# Patient Record
Sex: Male | Born: 1965 | Race: White | Hispanic: No | Marital: Married | Smoking: Current every day smoker
Health system: Southern US, Community
[De-identification: ages and names within clinical notes are randomized; demographics above are authoritative.]

## PROBLEM LIST (undated history)

## (undated) DIAGNOSIS — F319 Bipolar disorder, unspecified: Secondary | ICD-10-CM

## (undated) DIAGNOSIS — F329 Major depressive disorder, single episode, unspecified: Secondary | ICD-10-CM

## (undated) DIAGNOSIS — M1990 Unspecified osteoarthritis, unspecified site: Secondary | ICD-10-CM

## (undated) DIAGNOSIS — E039 Hypothyroidism, unspecified: Secondary | ICD-10-CM

## (undated) DIAGNOSIS — D649 Anemia, unspecified: Secondary | ICD-10-CM

## (undated) DIAGNOSIS — I714 Abdominal aortic aneurysm, without rupture, unspecified: Secondary | ICD-10-CM

## (undated) DIAGNOSIS — K5792 Diverticulitis of intestine, part unspecified, without perforation or abscess without bleeding: Secondary | ICD-10-CM

## (undated) DIAGNOSIS — R0902 Hypoxemia: Secondary | ICD-10-CM

## (undated) DIAGNOSIS — G4730 Sleep apnea, unspecified: Secondary | ICD-10-CM

## (undated) DIAGNOSIS — I739 Peripheral vascular disease, unspecified: Secondary | ICD-10-CM

## (undated) DIAGNOSIS — K219 Gastro-esophageal reflux disease without esophagitis: Secondary | ICD-10-CM

## (undated) DIAGNOSIS — F17200 Nicotine dependence, unspecified, uncomplicated: Secondary | ICD-10-CM

## (undated) DIAGNOSIS — I10 Essential (primary) hypertension: Secondary | ICD-10-CM

## (undated) DIAGNOSIS — F419 Anxiety disorder, unspecified: Secondary | ICD-10-CM

## (undated) DIAGNOSIS — R0600 Dyspnea, unspecified: Secondary | ICD-10-CM

## (undated) DIAGNOSIS — G709 Myoneural disorder, unspecified: Secondary | ICD-10-CM

## (undated) DIAGNOSIS — N189 Chronic kidney disease, unspecified: Secondary | ICD-10-CM

## (undated) DIAGNOSIS — A630 Anogenital (venereal) warts: Secondary | ICD-10-CM

## (undated) DIAGNOSIS — J449 Chronic obstructive pulmonary disease, unspecified: Secondary | ICD-10-CM

## (undated) DIAGNOSIS — I700 Atherosclerosis of aorta: Secondary | ICD-10-CM

## (undated) DIAGNOSIS — E222 Syndrome of inappropriate secretion of antidiuretic hormone: Secondary | ICD-10-CM

## (undated) DIAGNOSIS — E785 Hyperlipidemia, unspecified: Secondary | ICD-10-CM

## (undated) DIAGNOSIS — M479 Spondylosis, unspecified: Secondary | ICD-10-CM

## (undated) DIAGNOSIS — G039 Meningitis, unspecified: Secondary | ICD-10-CM

## (undated) DIAGNOSIS — E349 Endocrine disorder, unspecified: Secondary | ICD-10-CM

## (undated) DIAGNOSIS — F32A Depression, unspecified: Secondary | ICD-10-CM

## (undated) HISTORY — DX: Peripheral vascular disease, unspecified: I73.9

## (undated) HISTORY — DX: Unspecified osteoarthritis, unspecified site: M19.90

## (undated) HISTORY — DX: Myoneural disorder, unspecified: G70.9

## (undated) HISTORY — DX: Syndrome of inappropriate secretion of antidiuretic hormone: E22.2

## (undated) HISTORY — PX: OTHER SURGICAL HISTORY: SHX169

## (undated) HISTORY — DX: Chronic obstructive pulmonary disease, unspecified: J44.9

## (undated) HISTORY — DX: Gastro-esophageal reflux disease without esophagitis: K21.9

## (undated) HISTORY — DX: Sleep apnea, unspecified: G47.30

## (undated) HISTORY — DX: Hypothyroidism, unspecified: E03.9

## (undated) HISTORY — DX: Essential (primary) hypertension: I10

## (undated) HISTORY — PX: NASAL SEPTUM SURGERY: SHX37

## (undated) HISTORY — DX: Spondylosis, unspecified: M47.9

## (undated) HISTORY — PX: NASAL SINUS SURGERY: SHX719

## (undated) HISTORY — DX: Anemia, unspecified: D64.9

## (undated) HISTORY — DX: Depression, unspecified: F32.A

## (undated) HISTORY — PX: POLYPECTOMY: SHX149

## (undated) HISTORY — PX: SPINE SURGERY: SHX786

## (undated) HISTORY — PX: WRIST SURGERY: SHX841

## (undated) HISTORY — DX: Abdominal aortic aneurysm, without rupture, unspecified: I71.40

## (undated) HISTORY — DX: Anogenital (venereal) warts: A63.0

## (undated) HISTORY — DX: Anxiety disorder, unspecified: F41.9

## (undated) HISTORY — DX: Chronic kidney disease, unspecified: N18.9

## (undated) HISTORY — DX: Hypoxemia: R09.02

## (undated) HISTORY — PX: KNEE SURGERY: SHX244

## (undated) HISTORY — DX: Hyperlipidemia, unspecified: E78.5

## (undated) HISTORY — DX: Endocrine disorder, unspecified: E34.9

## (undated) HISTORY — DX: Major depressive disorder, single episode, unspecified: F32.9

## (undated) HISTORY — DX: Nicotine dependence, unspecified, uncomplicated: F17.200

## (undated) HISTORY — DX: Atherosclerosis of aorta: I70.0

## (undated) HISTORY — DX: Abdominal aortic aneurysm, without rupture: I71.4

---

## 2001-01-27 ENCOUNTER — Emergency Department (HOSPITAL_COMMUNITY): Admission: EM | Admit: 2001-01-27 | Discharge: 2001-01-27 | Payer: Self-pay | Admitting: Emergency Medicine

## 2001-02-04 ENCOUNTER — Emergency Department (HOSPITAL_COMMUNITY): Admission: EM | Admit: 2001-02-04 | Discharge: 2001-02-04 | Payer: Self-pay

## 2004-08-01 ENCOUNTER — Emergency Department (HOSPITAL_COMMUNITY): Admission: EM | Admit: 2004-08-01 | Discharge: 2004-08-01 | Payer: Self-pay | Admitting: Family Medicine

## 2004-11-22 ENCOUNTER — Emergency Department (HOSPITAL_COMMUNITY): Admission: EM | Admit: 2004-11-22 | Discharge: 2004-11-22 | Payer: Self-pay | Admitting: Family Medicine

## 2005-01-27 ENCOUNTER — Encounter: Admission: RE | Admit: 2005-01-27 | Discharge: 2005-01-27 | Payer: Self-pay | Admitting: Allergy and Immunology

## 2005-05-09 ENCOUNTER — Encounter: Admission: RE | Admit: 2005-05-09 | Discharge: 2005-05-09 | Payer: Self-pay | Admitting: Internal Medicine

## 2005-09-05 ENCOUNTER — Encounter: Admission: RE | Admit: 2005-09-05 | Discharge: 2005-09-05 | Payer: Self-pay | Admitting: Neurology

## 2005-11-27 HISTORY — PX: CHOLECYSTECTOMY: SHX55

## 2005-12-28 ENCOUNTER — Encounter
Admission: RE | Admit: 2005-12-28 | Discharge: 2005-12-28 | Payer: Self-pay | Admitting: Physical Medicine and Rehabilitation

## 2006-05-02 ENCOUNTER — Ambulatory Visit (HOSPITAL_COMMUNITY): Admission: RE | Admit: 2006-05-02 | Discharge: 2006-05-02 | Payer: Self-pay | Admitting: General Surgery

## 2006-05-04 ENCOUNTER — Encounter (INDEPENDENT_AMBULATORY_CARE_PROVIDER_SITE_OTHER): Payer: Self-pay | Admitting: Specialist

## 2006-05-04 ENCOUNTER — Ambulatory Visit (HOSPITAL_COMMUNITY): Admission: RE | Admit: 2006-05-04 | Discharge: 2006-05-04 | Payer: Self-pay | Admitting: General Surgery

## 2006-05-22 ENCOUNTER — Ambulatory Visit (HOSPITAL_COMMUNITY): Admission: RE | Admit: 2006-05-22 | Discharge: 2006-05-22 | Payer: Self-pay | Admitting: General Surgery

## 2006-08-03 ENCOUNTER — Encounter
Admission: RE | Admit: 2006-08-03 | Discharge: 2006-08-03 | Payer: Self-pay | Admitting: Physical Medicine and Rehabilitation

## 2006-12-17 ENCOUNTER — Emergency Department (HOSPITAL_COMMUNITY): Admission: EM | Admit: 2006-12-17 | Discharge: 2006-12-17 | Payer: Self-pay | Admitting: Family Medicine

## 2008-03-17 ENCOUNTER — Inpatient Hospital Stay (HOSPITAL_COMMUNITY): Admission: EM | Admit: 2008-03-17 | Discharge: 2008-03-19 | Payer: Self-pay | Admitting: Emergency Medicine

## 2008-03-30 ENCOUNTER — Ambulatory Visit (HOSPITAL_COMMUNITY): Admission: RE | Admit: 2008-03-30 | Discharge: 2008-03-30 | Payer: Self-pay | Admitting: Family Medicine

## 2011-04-11 NOTE — H&P (Signed)
NAME:  Tony Campos, Tony Campos               ACCOUNT NO.:  [id number]   MEDICAL RECORD NO.:  [id number]          PATIENT TYPE:  INP   LOCATION:  A225                          FACILITY:  [facility]   PHYSICIAN:  Dorris Singh, DO    DATE OF BIRTH:  12/10/1965   DATE OF ADMISSION:  03/17/2008  DATE OF DISCHARGE:  LH                              HISTORY & PHYSICAL   PRIMARY CARE PHYSICIAN:  The patient's primary care physician is Dr.  Christell Constant and the patient is full code.   CHIEF COMPLAINT:  Shortness of breath.   HISTORY OF PRESENT ILLNESS:  The patient is a 45 year old male who  presented to [facility] with a complaint of shortness of breath  that started about 2 weeks ago he stated that had been episodic off and  on.  It improves and then comes back.  He states that it has been  associated with dizziness and does not states that he has noticed any  chest pain or chest tightness or cough.  However, he has been concerned  because it has not resolved.  The patient also while he was being  evaluated for this continual shortness of breath, was found to be  hyponatremic and that point in time it was determined that the patient  needed replacement and would be admitted to the service of [facility].   PAST MEDICAL HISTORY:  Is chronic for bipolar disorder, back pain and  hypertension as well as hypothyroidism.  He currently states that he  quit about a year ago smoking and he does not drink.  He is currently  married.  His wife apparently is an employee with [employer].  He has  not had any surgeries.   ALLERGIES:  HE DOES NOT HAVE ANY ALLERGIES TO ANY MEDICATIONS,   The patient's medication list includes methocarbamol 500 mg at bedtime.  He also is on a fentanyl patch 50 mcg every 72 hours.  Unsure of when  last patch was placed.  A nicotine patch 14 mg daily.  Also, he is on  Klor-Con 10 mEq tablets p.o. daily, Synthroid 75 mcg p.o. daily,  Provigil 200 mg 1 p.o. in the morning, Provigil 100  mg 1 p.o. at  bedtime, bupropion HCl XL 300 mg p.o. daily, Benicar HCT 20/12.5 mg p.o.  daily, Lamotrigine 300 mg at bedtime, Simcor 500/20 mg 2 tablets at  bedtime, Abilify 10 mg at bedtime, Neurontin 600 mg one t.i.d. p.o.,  hydrochlorothiazide 25 mg p.o. daily, Nexium 40 mg p.o. daily.   REVIEW OF SYSTEMS:  CONSTITUTION:  Positive for some weakness.  EYES:  No changes in vision or eye pain.  EARS, NOSE, MOUTH, AND THROAT:  No  changes in smell or sore throat or changes in hearing.  CARDIOVASCULAR:  Negative for chest pain, palpitations, bradycardia.  RESPIRATORY:  Positive for dyspnea.  Negative for cough or wheezing.  GASTROINTESTINAL:  Negative for nausea or vomiting.  GU:  Negative for  dysuria or urgency.  MUSCULOSKELETAL:  Negative for arthralgias.  Positive for back pain.  SKIN:  Negative for pruritus or rash.  Positive  for a tattoo.  NEURO:  Negative for headache, confusion or altered  mental status.  METABOLIC:  Negative for depressive thirst or cold.  HEMATOLOGIC:  Negative for anemia.  PSYCHIATRIC:  Positive for bipolar.   PHYSICAL EXAMINATION:  His vitals are as follows:  His blood pressure is  100/56, pulse rate 83, respirations 16, temperature 97.7.  His pulse ox  is 99% on room air.  GENERAL:  The patient is a well-developed, well-nourished Caucasian male  who is in no acute distress.  He is pleasant and appropriate.  HEENT:  Head is normocephalic, atraumatic.  Eyes are EOMI.  PERRLA.  No  scleral icterus or conjunctival injection.  Ears: TMs visualized  bilaterally.  Nose: Turbinates are moist.  Mouth, teeth are in fair  repair.  No erythema or exudate noted.  NECK:  Supple.  No lymphadenopathy or thyromegaly.  CARDIOVASCULAR:  Regular rate and rhythm.  No murmurs, rubs or gallops.  RESPIRATORY/BREATHING:  Clear to auscultation bilaterally.  No rales,  wheezes or rhonchi.  ABDOMEN:  Soft, nontender, nondistended.  No masses, organomegaly or  guarding or rebound.   Bowel sounds are present in all 4 quadrants.  EXTREMITIES:  No edema, ecchymosis or cyanosis.  Good strength in all 4  extremities.  NEURO:  Cranial II-XII grossly intact.  The patient is A&O x3.   His EKG showed 81 beats per minute and was normal, and his urine was  within normal limits.  His sodium was 121, potassium 4, chloride 86,  carbon dioxide 28, glucose 135, BUN 4, and creatinine 0.73. The patient  has had 2 other sodiums.  His first sodium was 119, his second sodium  was 120, and his last sodium was 129.  His cardiac enzymes were normal  and his white count was 11, hemoglobin 12.5, hematocrit 35, and his  platelet count is 259.  He had a chest x-ray which showed COPD, probable  artifact related to monitor leads, but cannot rule out left upper lobe  nodule.  Recommend repeat chest x-ray, PA and lateral, after removal of  leads.   ASSESSMENT:  1. Hyponatremia.  2. Shortness of breath.  3. Abnormal chest x-ray.  4. Hyponatremia.   PLAN:  1. Will admit the patient to the service of [facility] for      hyponatremia.  Will go ahead and hydrate the patient and recheck      sodium in the morning.  Also will consult Dr. Kristian Covey for any      other recommendations he may have regarding the origin of this      chronic hyponatremia and any other recommendations he may have.  2. Shortness of breath.  Will go ahead and start the patient on      breathing treatments as well and see how he improves.  Also will      repeat his chest x-ray within a day or so or outpatient to      determine if there is truly an abnormality in this chest x-ray that      had been noted once it was read.  3. Also will do deep vein thrombosis and gastrointestinal prophylaxis.      Will also place the patient on all of his home medications.  Will      continue to monitor him and change medications as necessary.      Dorris Singh, DO  Electronically Signed     CB/MEDQ  D:  03/18/2008  T:  03/18/2008  Job:  [id number]

## 2011-04-11 NOTE — Discharge Summary (Signed)
NAME:  Tony Campos, Tony Campos               ACCOUNT NO.:  [id number]   MEDICAL RECORD NO.:  [id number]          PATIENT TYPE:  INP   LOCATION:  A326                          FACILITY:  [facility]   PHYSICIAN:  Osvaldo Shipper, MD     DATE OF BIRTH:  1966/04/20   DATE OF ADMISSION:  03/17/2008  DATE OF DISCHARGE:  LH                               DISCHARGE SUMMARY   The patient's PMD is Dr. Rudi Heap at [facility].   DISCHARGE DIAGNOSES:  1. Hyponatremia, unclear etiology, possibly due to diuretic use,      improved.  2. Dyspnea, unclear etiology, requiring outpatient follow-up.  3. History of bipolar disorder, back pain, hypertension,      hypothyroidism, all stable.   BRIEF HOSPITAL COURSE:  Briefly, this is a 45 year old Caucasian male  who came into the ED complaining of shortness of breath for about 2  weeks.  The patient was evaluated in the ED, where he underwent chest x-  ray which showed possible lung nodule, otherwise nothing acute, and it  also showed evidence for COPD.  A repeat x-ray did not show the lung  nodule.  The patient as part of the evaluation in the ED, he underwent  blood work which actually showed a sodium of 119.  The patient was  completely asymptomatic with no nausea or vomiting.  The only thing that  he was on was a diuretic, which can cause hyponatremia.  He also has a  history of hypothyroidism can also cause hyponatremia.  Anyway, the  patient was admitted to the hospital, given IV fluids and his sodium has  come up to 133 as of today.  His shortness of breath has also resolved.  His cardiac markers are negative.  His BNP was unremarkable.  D-dimer  was 0.22.  His shortness of breath could be because of COPD, though it  is very difficult to say.  A pulmonary function test could be done as an  outpatient to further elucidate the reason for his dyspnea.   This morning, the patient is feeling quite well.  No shortness of breath  or chest  pain.  No nausea or vomiting.  He ate his breakfast.  He does  not have any other complaints to offer.  His vital signs are all stable.  His exam is unremarkable.  His sodium has come up to 133, so his overall  considered stable for discharge.   DISCHARGE MEDICATIONS:  He has been asked to discontinue his Benicar  HCT, as well as hydrochlorothiazide.  He had been started on Benicar 40  mg daily.  Otherwise, he may continue his other home medications, which  include Klor-Con 10 mg daily, Synthroid 75 mcg daily, Provigil 200 mg in  the morning and 100 mg at bedtime, bupropion XL 300 mg daily,  lamotrigine 300 mg at bedtime, Simcor 2 tablets at bedtime, Abilify 10  mg at bedtime, Neurontin 600 mg three times a day, Nexium 40 mg daily,  methocarbamol 500 mg at bedtime.  He is on a fentanyl patch 15 mcg every  72  hours and nicotine patch 14 mg daily.   FOLLOWUP:  He has an appointment with Dr. Christell Constant tomorrow.  His been  asked to discuss with Dr. Christell Constant regarding referral to an endocrinologist  or a nephrologist for evaluation of his hyponatremia.  I will also  recommend PMD to consider repeat BMET in a week's time.   DIET:  Heart-healthy diet.   PHYSICAL ACTIVITY:  No restrictions.   Total time spent on this discharge was 35 minutes.      Osvaldo Shipper, MD  Electronically Signed     GK/MEDQ  D:  03/19/2008  T:  03/19/2008  Job:  [id number]   cc:   Ernestina Penna, M.D.  Fax: [phone]

## 2011-04-14 NOTE — Procedures (Signed)
NAME:  Tony Campos, Tony Campos               ACCOUNT NO.:  [id number]   MEDICAL RECORD NO.:  [id number]          PATIENT TYPE:  OUT   LOCATION:  RESP                          FACILITY:  [facility]   PHYSICIAN:  Edward L. Juanetta Gosling, M.D.DATE OF BIRTH:  08/19/1966   DATE OF PROCEDURE:  DATE OF DISCHARGE:                            PULMONARY FUNCTION TEST   1. Spirometry shows no ventilatory defect but evidence of airflow      obstruction.  2. Lung volumes are normal.  3. DLCO is mildly reduced.  4. There is no significant bronchodilator improvement.      Edward L. Juanetta Gosling, M.D.  Electronically Signed     ELH/MEDQ  D:  03/31/2008  T:  03/31/2008  Job:  [id number]   cc:   Osvaldo Shipper, MD   Ernestina Penna, M.D.  Fax: [phone]

## 2011-04-14 NOTE — Op Note (Signed)
NAME:  Tony Campos, Tony Campos               ACCOUNT NO.:  [id number]   MEDICAL RECORD NO.:  [id number]          PATIENT TYPE:  AMB   LOCATION:  DAY                           FACILITY:  [facility]   PHYSICIAN:  Dalia Heading, M.D.  DATE OF BIRTH:  [birth year]   DATE OF PROCEDURE:  05/04/2006  DATE OF DISCHARGE:                                 OPERATIVE REPORT   PREOPERATIVE DIAGNOSIS:  Cholecystitis, cholelithiasis, abscess trunk.   POSTOPERATIVE DIAGNOSIS:  Cholecystitis, cholelithiasis, abscess trunk.   PROCEDURE:  Laparoscopic cholecystectomy, incision and drainage of abscess,  trunk.   SURGEON:  Dalia Heading, M.D.   ASSISTANT:  Bernerd Limbo. Leona Carry, M.D.   ANESTHESIA:  General endotracheal.   INDICATIONS:  The patient is a 45 year old white male who presents with  cholecystitis secondary to cholelithiasis.  He also has a developing boil in  the right inguinal region.  He presents for both laparoscopic  cholecystectomy and incision and drainage of the boil.  The risks and  benefits of both procedures including bleeding, infection, hepatobiliary  injury, possibility of an open procedure were fully explained to the patient  who gave informed consent.   PROCEDURE NOTE:  The patient was placed in the supine position.  After  induction of general endotracheal anesthesia, the abdomen was prepped and  draped using the usual sterile technique with Betadine.  Surgical site  confirmation was performed.  A supraumbilical incision was made down to  fascia.  The Veress needle was introduced into the abdominal cavity and  confirmation of placement was done using the saline drop test.  The abdomen  was then insufflated to 16 mmHg pressure.  An 11-mm trocar was introduced  into the abdominal cavity under direct visualization without difficulty.  The patient was placed in reversed Trendelenburg position and an additional  11-mm trocar was placed in the epigastric region.  A 5-mm trocars was placed  in  the right upper quadrant and right flank regions.  The liver was  inspected and noted to be within normal limits.  The gallbladder was  retracted superior and laterally.  The dissection was begun around the  infundibulum of the gallbladder.  The cystic duct was first identified.  Its  juncture to the infundibulum was fully identified.  Endoclips were placed  proximally and distally on the cystic duct and the cystic duct was divided.  This was likewise done to the cystic artery.  The gallbladder was then freed  away from the gallbladder fossa using Bovie electrocautery.  The gallbladder  was delivered through the epigastric trocar site using the EndoCatch bag.  The gallbladder fossa was inspected and no abnormal bleeding or bile leakage  was noted.  Surgicel was placed in the gallbladder fossa.  All fluid and air  was then evacuated from the abdominal cavity prior to removal of the  trocars.  All wounds were irrigated with normal saline.  All wounds were  injected with 0.5% Sensorcaine.  The supraumbilical fascia was  reapproximated using an 0 Vicryl interrupted suture.  All skin incisions  were closed using staples.  Betadine ointment and  dry sterile dressings were  applied.   Next, the right groin boil was incised.  This was anterior and approximately  2 cm in its greatest diameter.  Purulent fluid was expressed.  The wound was  irrigated with normal saline.  Neosporin ointment and dry sterile dressing  were applied.   All tape and needle counts were correct at the end of the procedures.  The  patient was extubated in the operating room and went back to the recovery  room awake in stable condition.   COMPLICATIONS:  None.   SPECIMEN:  Gallbladder.   BLOOD LOSS:  Minimal.      Dalia Heading, M.D.  Electronically Signed     MAJ/MEDQ  D:  05/04/2006  T:  05/04/2006  Job:  [id number]   cc:   Ernestina Penna, M.D.  Fax: [phone]

## 2011-04-14 NOTE — H&P (Signed)
NAME:  Tony Campos               ACCOUNT NO.:  [id number]   MEDICAL RECORD NO.:  [id number]          PATIENT TYPE:  AMB   LOCATION:  DAY                           FACILITY:  [facility]   PHYSICIAN:  Dalia Heading, M.D.  DATE OF BIRTH:  19-Mar-1966   DATE OF ADMISSION:  DATE OF DISCHARGE:  LH                                HISTORY & PHYSICAL   CHIEF COMPLAINT:  Cholecystitis, cholelithiasis.   HISTORY OF PRESENT ILLNESS:  The patient is a 45 year old white male who was  referred for evaluation and treatment cholecystitis secondary to  cholelithiasis.  He has been having right upper quadrant abdominal pain with  radiation to the right flank, nausea, bloating for several weeks.  Does have  fatty food intolerance.  No fever, chills, or jaundice have been noted.   PAST MEDICAL HISTORY:  1.  Chronic back problems.  2.  Hypothyroidism.  3.  Depression.  4.  Hypertension.   PAST SURGICAL HISTORY:  Back surgery.   CURRENT MEDICATIONS:  1.  Nexium 40 mg p.o. daily.  2.  Benicar/Hydrochlorothiazide 20/12.5 mg p.o. daily.  3.  Synthroid 0.075 mg p.o. daily.  4.  Wellbutrin XL 150 mg p.o. daily.  5.  Fentanyl patch 50 mcg per hour every three days.  6.  Oxycodone 10/325 mg p.o. q.4h. p.r.n. pain.  7.  Soma 350 mg p.o. q.i.d.  8.  Lyrica 75 mg p.o. daily.  9.  Ambien 12.5 mg p.o. nightly p.r.n.   ALLERGIES:  NO KNOWN DRUG ALLERGIES.   REVIEW OF SYSTEMS:  The patient does smoke.  He denies any bleeding  disorders.   PHYSICAL EXAMINATION:  GENERAL APPEARANCE:  The patient is a well-developed,  well-nourished white male in no acute distress.  HEENT:  Examination reveals no scleral icterus.  LUNGS:  Clear to auscultation with equal breath sounds bilaterally.  HEART:  Examination reveals regular rate and rhythm without S3, S4, murmurs.  ABDOMEN:  Soft, nondistended.  He is tender in the right upper quadrant to  palpation.  No hepatosplenomegaly, masses, hernias are identified.   Ultrasound gallbladder reveals cholelithiasis with a normal common bile  duct.   IMPRESSION:  Cholecystitis, cholelithiasis.   PLAN:  The patient is scheduled for laparoscopic cholecystectomy on May 04, 2006.  Risks and benefits of the procedure including bleeding, infection,  hepatobiliary injury and the possibly of an open procedure were fully  explained to the patient, gave informed consent.      Dalia Heading, M.D.  Electronically Signed     MAJ/MEDQ  D:  05/03/2006  T:  05/03/2006  Job:  [id number]   cc:   Short Stay   Ernestina Penna, M.D.  Fax: [phone]

## 2011-05-19 ENCOUNTER — Encounter: Payer: Self-pay | Admitting: Family Medicine

## 2011-05-19 DIAGNOSIS — F329 Major depressive disorder, single episode, unspecified: Secondary | ICD-10-CM | POA: Insufficient documentation

## 2011-05-19 DIAGNOSIS — E785 Hyperlipidemia, unspecified: Secondary | ICD-10-CM | POA: Insufficient documentation

## 2011-05-19 DIAGNOSIS — M479 Spondylosis, unspecified: Secondary | ICD-10-CM | POA: Insufficient documentation

## 2011-05-19 DIAGNOSIS — I10 Essential (primary) hypertension: Secondary | ICD-10-CM | POA: Insufficient documentation

## 2011-05-19 DIAGNOSIS — E349 Endocrine disorder, unspecified: Secondary | ICD-10-CM | POA: Insufficient documentation

## 2011-05-19 DIAGNOSIS — F32A Depression, unspecified: Secondary | ICD-10-CM | POA: Insufficient documentation

## 2011-05-19 DIAGNOSIS — E039 Hypothyroidism, unspecified: Secondary | ICD-10-CM | POA: Insufficient documentation

## 2011-05-19 DIAGNOSIS — F419 Anxiety disorder, unspecified: Secondary | ICD-10-CM | POA: Insufficient documentation

## 2011-05-19 DIAGNOSIS — K219 Gastro-esophageal reflux disease without esophagitis: Secondary | ICD-10-CM | POA: Insufficient documentation

## 2011-08-22 LAB — BASIC METABOLIC PANEL
BUN: 6
CO2: 28
CO2: 28
CO2: 29
Calcium: 9.3
Calcium: 9.3
Calcium: 9.7
Chloride: 101
Chloride: 83 — ABNORMAL LOW
Chloride: 86 — ABNORMAL LOW
Chloride: 91 — ABNORMAL LOW
Creatinine, Ser: 0.69
Creatinine, Ser: 0.79
GFR calc Af Amer: >60
GFR calc Af Amer: >60
GFR calc Af Amer: >60
GFR calc Af Amer: >60
GFR calc non Af Amer: >60
GFR calc non Af Amer: >60
Glucose, Bld: 111 — ABNORMAL HIGH
Glucose, Bld: 114 — ABNORMAL HIGH
Potassium: 3.9
Potassium: 4
Potassium: 4.5
Potassium: 4.6
Sodium: 119 — CL
Sodium: 120 — ABNORMAL LOW
Sodium: 121 — ABNORMAL LOW

## 2011-08-22 LAB — URINALYSIS, ROUTINE W REFLEX MICROSCOPIC
Bilirubin Urine: NEGATIVE
Glucose, UA: NEGATIVE
Glucose, UA: NEGATIVE
Hgb urine dipstick: NEGATIVE
Hgb urine dipstick: NEGATIVE
Specific Gravity, Urine: 1.01
Specific Gravity, Urine: <1.005 — ABNORMAL LOW
Urobilinogen, UA: 0.2
pH: 6.5

## 2011-08-22 LAB — DIFFERENTIAL
Basophils Absolute: 0
Basophils Relative: 0
Eosinophils Relative: 0
Lymphocytes Relative: 41
Lymphs Abs: 3.7
Monocytes Absolute: 0.9
Monocytes Relative: 11
Neutro Abs: 4.2
Neutro Abs: 7.5
Neutrophils Relative %: 47

## 2011-08-22 LAB — HEPATIC FUNCTION PANEL
ALT: 10
AST: 14
Albumin: 3.5
Alkaline Phosphatase: 88
Total Bilirubin: 0.3

## 2011-08-22 LAB — CBC
HCT: 35 — ABNORMAL LOW
Hemoglobin: 12.5 — ABNORMAL LOW
MCHC: 35.7
MCV: 85.4
MCV: 85.6
Platelets: 259
RBC: 3.7 — ABNORMAL LOW
RBC: 4.09 — ABNORMAL LOW
RDW: 13.6
WBC: 11 — ABNORMAL HIGH
WBC: 9.1

## 2011-08-22 LAB — CARDIAC PANEL(CRET KIN+CKTOT+MB+TROPI): CK, MB: 1.1

## 2011-08-22 LAB — OSMOLALITY, URINE: Osmolality, Ur: 132 — ABNORMAL LOW

## 2011-08-22 LAB — B-NATRIURETIC PEPTIDE (CONVERTED LAB): Pro B Natriuretic peptide (BNP): <30

## 2012-01-05 ENCOUNTER — Encounter (INDEPENDENT_AMBULATORY_CARE_PROVIDER_SITE_OTHER): Payer: Self-pay | Admitting: Surgery

## 2012-01-15 ENCOUNTER — Ambulatory Visit (INDEPENDENT_AMBULATORY_CARE_PROVIDER_SITE_OTHER): Payer: Medicare Other | Admitting: Surgery

## 2012-01-15 ENCOUNTER — Encounter (INDEPENDENT_AMBULATORY_CARE_PROVIDER_SITE_OTHER): Payer: Self-pay | Admitting: Surgery

## 2012-01-15 VITALS — BP 152/82 | HR 92 | Temp 99.1°F | Resp 18 | Ht 72.0 in | Wt 212.8 lb

## 2012-01-15 DIAGNOSIS — A630 Anogenital (venereal) warts: Secondary | ICD-10-CM

## 2012-01-15 NOTE — Progress Notes (Signed)
Patient ID: Tony Campos, male   DOB: Oct 03, 1966, 46 y.o.   MRN: [id number]  Chief Complaint  Patient presents with  . Genital Warts    new pt- eval rectal warts    HPI Tony Campos is a 46 y.o. male.   HPIThis gentleman is referred by Dr. Terri Piedra for evaluation of anal warts. He has been treated as an outpatient for many years. He has always had successful outpatient treatment. He was referred here to make sure there were no intra-anal condyloma present. He currently has no complaints. He is moving his bowels well and has had no incontinence issues  Past Medical History  Diagnosis Date  . Acid reflux   . Anxiety   . Depression   . Hypothyroidism   . Hypertension   . Hyperlipidemia   . Testosterone deficiency   . Spondylosis   . Genital warts     Past Surgical History  Procedure Date  . Knee surgery     right  . Wrist surgery     right and left  . Cholecystectomy 2007  . Nasal septum surgery   . Nasal sinus surgery     Family History  Problem Relation Age of Onset  . Cancer Maternal Grandfather     stomach    Social History History  Substance Use Topics  . Smoking status: Current Everyday Smoker -- 1.0 packs/day  . Smokeless tobacco: Not on file  . Alcohol Use: No    Allergies  Allergen Reactions  . Androgel     edoma    Current Outpatient Prescriptions  Medication Sig Dispense Refill  . amLODipine (NORVASC) 10 MG tablet Take 10 mg by mouth daily.      . ARIPiprazole (ABILIFY) 15 MG tablet Take 15 mg by mouth daily.        Marland Kitchen buPROPion (WELLBUTRIN XL) 300 MG 24 hr tablet Take 300 mg by mouth daily.        . clonazePAM (KLONOPIN) 1 MG tablet Take 1 mg by mouth 2 (two) times daily as needed.      Marland Kitchen esomeprazole (NEXIUM) 40 MG capsule Take 40 mg by mouth daily before breakfast.        . fentaNYL (DURAGESIC - DOSED MCG/HR) 50 MCG/HR Place 1 patch onto the skin every 3 (three) days.        Marland Kitchen ibuprofen (ADVIL,MOTRIN) 800 MG tablet Take 800 mg by mouth every  8 (eight) hours as needed.      . lamoTRIgine (LAMICTAL) 200 MG tablet Take 200 mg by mouth daily.        Marland Kitchen levothyroxine (SYNTHROID, LEVOTHROID) 112 MCG tablet Take 112 mcg by mouth daily.        . methocarbamol (ROBAXIN) 750 MG tablet Take 750 mg by mouth daily.        Marland Kitchen olmesartan (BENICAR) 40 MG tablet Take 40 mg by mouth daily.        Marland Kitchen omega-3 acid ethyl esters (LOVAZA) 1 G capsule Take 1 g by mouth daily.        Marland Kitchen oxyCODONE (OXYCONTIN) 10 MG 12 hr tablet Take 10 mg by mouth every 12 (twelve) hours.      . potassium chloride (KLOR-CON) 10 MEQ CR tablet Take 10 mEq by mouth daily.        . rosuvastatin (CRESTOR) 20 MG tablet Take 20 mg by mouth daily.        Marland Kitchen testosterone cypionate (DEPOTESTOTERONE CYPIONATE) 100 MG/ML injection Inject into the  muscle every 14 (fourteen) days.          Review of Systems Review of Systems  Constitutional: Negative for fever, chills and unexpected weight change.  HENT: Negative for hearing loss, congestion, sore throat, trouble swallowing and voice change.   Eyes: Negative for visual disturbance.  Respiratory: Negative for cough and wheezing.   Cardiovascular: Positive for leg swelling. Negative for chest pain and palpitations.  Gastrointestinal: Negative for nausea, vomiting, abdominal pain, diarrhea, constipation, blood in stool, abdominal distention, anal bleeding and rectal pain.  Genitourinary: Negative for hematuria and difficulty urinating.  Musculoskeletal: Positive for arthralgias.  Skin: Negative for rash and wound.  Neurological: Positive for headaches. Negative for seizures, syncope and weakness.  Hematological: Negative for adenopathy. Does not bruise/bleed easily.  Psychiatric/Behavioral: Negative for confusion.    Blood pressure 152/82, pulse 92, temperature 99.1 F (37.3 C), temperature source Temporal, resp. rate 18, height 6' (1.829 m), weight 212 lb 12.8 oz (96.525 kg).  Physical Exam Physical Exam  Constitutional: He is  oriented to person, place, and time. He appears well-developed and well-nourished.  HENT:  Head: Normocephalic and atraumatic.  Right Ear: External ear normal.  Left Ear: External ear normal.  Nose: Nose normal.  Mouth/Throat: Oropharynx is clear and moist.  Eyes: Conjunctivae are normal. Pupils are equal, round, and reactive to light. No scleral icterus.  Neck: Normal range of motion. Neck supple. No tracheal deviation present.  Cardiovascular: Normal rate, regular rhythm, normal heart sounds and intact distal pulses.   Pulmonary/Chest: Effort normal and breath sounds normal. No respiratory distress. He has no wheezes.  Genitourinary: Rectum normal.       Rectal tone is normal. I can palpate no abnormalities. I performed endoscopy and only saw one tiny condyloma versus cyst which I cannot visualize again. The rest of his intra-anal exam was normal  Musculoskeletal: Normal range of motion. He exhibits no edema and no tenderness.  Lymphadenopathy:    He has no cervical adenopathy.  Neurological: He is alert and oriented to person, place, and time.  Skin: Skin is warm and dry. He is not diaphoretic.  Psychiatric: Judgment normal.    Data Reviewed I have been answered Dr. Oliver Hum which I have reviewed  Assessment    Anal condyloma    Plan    Since it is only one tiny area, I would follow this conservatively. I will  reexamine him in 3 months. If it is getting larger or he develops other areas I will then consider removal in the operating room under general anesthesia       Tony Campos A 01/15/2012, 12:01 PM

## 2012-04-11 ENCOUNTER — Encounter (INDEPENDENT_AMBULATORY_CARE_PROVIDER_SITE_OTHER): Payer: Self-pay | Admitting: Surgery

## 2012-04-15 ENCOUNTER — Ambulatory Visit (INDEPENDENT_AMBULATORY_CARE_PROVIDER_SITE_OTHER): Payer: Medicare Other | Admitting: Surgery

## 2012-04-15 ENCOUNTER — Encounter (INDEPENDENT_AMBULATORY_CARE_PROVIDER_SITE_OTHER): Payer: Self-pay | Admitting: Surgery

## 2012-04-15 VITALS — BP 150/91 | HR 73 | Temp 98.8°F | Ht 72.0 in | Wt 209.6 lb

## 2012-04-15 DIAGNOSIS — A630 Anogenital (venereal) warts: Secondary | ICD-10-CM

## 2012-04-15 NOTE — Progress Notes (Signed)
Subjective:     Patient ID: Tony Campos, male   DOB: 1966/01/16, 46 y.o.   MRN: [id number]  HPI He is here for another recheck of his anal condyloma. When I saw him last I could only visualize a tiny cyst on anoscopy.  Today he is doing well he reports he has had no problems  Review of Systems     Objective:   Physical Exam On visual inspection of the anus, I could not visualize any condyloma. I performed an anoscopy and again saw no intra-anal condyloma    Assessment:     Patient with history of anal condyloma    Plan:     I want to see him back in 6 months to repeat his examination. He will come back sooner should he feel any condyloma

## 2013-01-15 ENCOUNTER — Other Ambulatory Visit: Payer: Self-pay | Admitting: Physical Medicine and Rehabilitation

## 2013-01-15 ENCOUNTER — Ambulatory Visit
Admission: RE | Admit: 2013-01-15 | Discharge: 2013-01-15 | Disposition: A | Discharge status: Home / Self Care | Payer: Medicare Other | Source: Ambulatory Visit | Attending: Physical Medicine and Rehabilitation | Admitting: Physical Medicine and Rehabilitation

## 2013-01-15 DIAGNOSIS — M19079 Primary osteoarthritis, unspecified ankle and foot: Secondary | ICD-10-CM

## 2013-01-15 DIAGNOSIS — R52 Pain, unspecified: Secondary | ICD-10-CM

## 2013-03-06 ENCOUNTER — Ambulatory Visit (INDEPENDENT_AMBULATORY_CARE_PROVIDER_SITE_OTHER): Payer: Medicare Other | Admitting: Family Medicine

## 2013-03-06 DIAGNOSIS — Z79899 Other long term (current) drug therapy: Secondary | ICD-10-CM

## 2013-03-06 DIAGNOSIS — E785 Hyperlipidemia, unspecified: Secondary | ICD-10-CM

## 2013-03-06 DIAGNOSIS — E291 Testicular hypofunction: Secondary | ICD-10-CM

## 2013-03-06 MED ORDER — TESTOSTERONE CYPIONATE 200 MG/ML IM SOLN
200.0000 mg | Freq: Once | INTRAMUSCULAR | Status: AC
  Administered 2013-03-06: 200 mg via INTRAMUSCULAR

## 2013-03-07 ENCOUNTER — Other Ambulatory Visit (INDEPENDENT_AMBULATORY_CARE_PROVIDER_SITE_OTHER): Payer: Medicare Other

## 2013-03-07 DIAGNOSIS — Z0000 Encounter for general adult medical examination without abnormal findings: Secondary | ICD-10-CM

## 2013-03-07 DIAGNOSIS — E785 Hyperlipidemia, unspecified: Secondary | ICD-10-CM

## 2013-03-07 DIAGNOSIS — Z79899 Other long term (current) drug therapy: Secondary | ICD-10-CM

## 2013-03-07 DIAGNOSIS — E291 Testicular hypofunction: Secondary | ICD-10-CM

## 2013-03-07 LAB — COMPLETE METABOLIC PANEL WITH GFR
ALT: 9 U/L (ref 0–53)
Albumin: 4.2 g/dL (ref 3.5–5.2)
Alkaline Phosphatase: 81 U/L (ref 39–117)
CO2: 31 mEq/L (ref 19–32)
GFR, Est African American: >89 mL/min
GFR, Est Non African American: >89 mL/min
Glucose, Bld: 92 mg/dL (ref 70–99)
Potassium: 4.6 mEq/L (ref 3.5–5.3)
Sodium: 142 mEq/L (ref 135–145)
Total Protein: 6.2 g/dL (ref 6.0–8.3)

## 2013-03-07 LAB — LIPID PANEL
LDL Cholesterol: 176 mg/dL — ABNORMAL HIGH (ref 0–99)
Total CHOL/HDL Ratio: 6.1 Ratio

## 2013-03-07 LAB — CBC WITH DIFFERENTIAL/PLATELET
Basophils Absolute: 0 10*3/uL (ref 0.0–0.1)
Eosinophils Relative: 2 % (ref 0–5)
HCT: 38.1 % — ABNORMAL LOW (ref 39.0–52.0)
Lymphocytes Relative: 48 % — ABNORMAL HIGH (ref 12–46)
Lymphs Abs: 5.2 10*3/uL — ABNORMAL HIGH (ref 0.7–4.0)
MCV: 84.7 fL (ref 78.0–100.0)
Monocytes Absolute: 1 10*3/uL (ref 0.1–1.0)
Neutro Abs: 4.4 10*3/uL (ref 1.7–7.7)
RBC: 4.5 MIL/uL (ref 4.22–5.81)
RDW: 14.4 % (ref 11.5–15.5)
WBC: 10.7 10*3/uL — ABNORMAL HIGH (ref 4.0–10.5)

## 2013-03-07 LAB — PSA: PSA: 1.2 ng/mL (ref <=4.00)

## 2013-03-10 NOTE — Progress Notes (Signed)
Ov on 03/19/13

## 2013-03-17 ENCOUNTER — Encounter: Payer: Self-pay | Admitting: Family Medicine

## 2013-03-19 ENCOUNTER — Encounter: Payer: Self-pay | Admitting: Physician Assistant

## 2013-03-19 ENCOUNTER — Ambulatory Visit (INDEPENDENT_AMBULATORY_CARE_PROVIDER_SITE_OTHER): Payer: Medicare Other | Admitting: Physician Assistant

## 2013-03-19 VITALS — BP 154/86 | HR 84 | Temp 98.5°F | Resp 18 | Ht 69.5 in | Wt 225.0 lb

## 2013-03-19 DIAGNOSIS — F17200 Nicotine dependence, unspecified, uncomplicated: Secondary | ICD-10-CM | POA: Insufficient documentation

## 2013-03-19 DIAGNOSIS — E785 Hyperlipidemia, unspecified: Secondary | ICD-10-CM

## 2013-03-19 DIAGNOSIS — M25469 Effusion, unspecified knee: Secondary | ICD-10-CM

## 2013-03-19 DIAGNOSIS — K219 Gastro-esophageal reflux disease without esophagitis: Secondary | ICD-10-CM

## 2013-03-19 DIAGNOSIS — I10 Essential (primary) hypertension: Secondary | ICD-10-CM

## 2013-03-19 DIAGNOSIS — E291 Testicular hypofunction: Secondary | ICD-10-CM

## 2013-03-19 DIAGNOSIS — E349 Endocrine disorder, unspecified: Secondary | ICD-10-CM

## 2013-03-19 DIAGNOSIS — E039 Hypothyroidism, unspecified: Secondary | ICD-10-CM

## 2013-03-19 DIAGNOSIS — M25461 Effusion, right knee: Secondary | ICD-10-CM | POA: Insufficient documentation

## 2013-03-19 MED ORDER — TESTOSTERONE CYPIONATE 200 MG/ML IM SOLN
200.0000 mg | Freq: Once | INTRAMUSCULAR | Status: AC
  Administered 2013-03-19: 200 mg via INTRAMUSCULAR

## 2013-03-19 MED ORDER — PRAVASTATIN SODIUM 20 MG PO TABS: 20.0000 mg | ORAL_TABLET | Freq: Every day | ORAL | Status: DC

## 2013-03-19 NOTE — Progress Notes (Signed)
Patient ID: DEQUINCY BORN MRN: [id number], DOB: Feb 22, 1966, 47 y.o. Date of Encounter: @DATE @  Chief Complaint:  Chief Complaint  Patient presents with  . Injections    testosterone  . post lab followup  . fluid on knee again    HPI: 47 y.o. year old male  presents for routine f/u of:  1-Smoker: Now is only smoking 3 days a week- on the days that he works. Also, on those days he stays with his brother and he smokes so pt smokes there also.  2- Hyperlipidemia: When he was here for LOV 10/13 he had just gotten 90 of Crestor. Says he took that bottle but since then, he accidentally stopped cholesterol med. (we had discussed changing to Pravachol but he did not do this.)Therefore, has taken no cholesterol med since January. Also, says he has been eating a lot-gained 25 lbs per pt. He plans to make diet changes.   3-GERD: At LOV was taking Zantac and Nexium. Since then, changed Nexium to protonix secondary to cost. Says he has stopped Zantac. Occasionally will need this in addition to protonix, but not usually.    4- Right Knee: Says it is achy and swollen again. Says he had surgery "to clean it out" years ago. Says [facility] "drained off fluid' and did XRay 3 mos ago. The swelling is back. It feels achy, especially if he is squatting, which he has to do at work. (works at [employer] as a Nature conservation officer).   Past Medical History  Diagnosis Date  . Acid reflux   . Anxiety   . Depression   . Hypothyroidism   . Hypertension   . Hyperlipidemia   . Testosterone deficiency   . Spondylosis   . Genital warts   . Smoker      Home Meds: Current Outpatient Prescriptions on File Prior to Visit  Medication Sig Dispense Refill  . [DISCONTINUED] esomeprazole (NEXIUM) 40 MG capsule Take 40 mg by mouth daily before breakfast.        . [DISCONTINUED] ibuprofen (ADVIL,MOTRIN) 800 MG tablet Take 800 mg by mouth every 8 (eight) hours as needed.      . [DISCONTINUED] potassium  chloride (KLOR-CON) 10 MEQ CR tablet Take 10 mEq by mouth daily.         No current facility-administered medications on file prior to visit.    Allergies:  Allergies  Allergen Reactions  . Testosterone     Edema from topical applications    History   Social History  . Marital Status: Married    Spouse Name: N/A    Number of Children: N/A  . Years of Education: N/A   Occupational History  . Works part time at [employer].    Social History Main Topics  . Smoking status: Current Every Day Smoker -- 1.00 packs/day    Types: Cigarettes  . Smokeless tobacco: Not on file  . Alcohol Use: No  . Drug Use: No  . Sexually Active: Not on file   Other Topics Concern  . Not on file   Social History Narrative  . No narrative on file    Family History  Problem Relation Age of Onset  . Cancer Maternal Grandfather     stomach     Review of Systems: Constitutional: negative for chills, fever, night sweats, weight changes, or fatigue  HEENT: negative for vision changes, hearing loss, congestion, rhinorrhea, ST, epistaxis, or sinus pressure Cardiovascular: negative for chest pain or palpitations.  No new/increased shortness of breath or dyspnea on exertion Respiratory: negative for hemoptysis, wheezing, shortness of breath, or cough Abdominal: negative for abdominal pain, nausea, vomiting, diarrhea, or constipation Dermatological: negative for rash or concerning skin lesions Neurologic: negative for headache, dizziness, or syncope All other systems reviewed and are otherwise negative with the exception to those above and in the HPI.   Physical Exam: Blood pressure 154/86, pulse 84, temperature 98.5 F (36.9 C), temperature source Oral, resp. rate 18, height 5' 9.5" (1.765 m), weight 225 lb (102.059 kg)., Body mass index is 32.76 kg/(m^2). General: Well developed, well nourished,WM in no acute distress. Neck: Supple. No thyromegaly. Full ROM. No  lymphadenopathy. Lungs: Clear bilaterally to auscultation without wheezes, rales, or rhonchi. Breathing is unlabored. Heart: RRR with S1 S2. No murmurs, rubs, or gallops. Abdomen: Soft, non-tender, non-distended with normoactive bowel sounds. No hepatomegaly. No rebound/guarding. No obvious abdominal masses. Musculoskeletal:  Strength and tone normal for age. Right Knee:  Anterior aspect, inferior to patella, there is a one inch diameter area of swelling. No joint line tenderness with palpation. No laxity with valgus, varus pressure. Negative grind test.  Extremities/Skin: Warm and dry. No clubbing or cyanosis. No edema. No rashes or suspicious lesions. Neuro: Alert and oriented X 3. Moves all extremities spontaneously. Gait is normal. CNII-XII grossly in tact. Psych:  Responds to questions appropriately with a normal affect.   Labs: Results for orders placed in visit on 03/07/13  COMPLETE METABOLIC PANEL WITH GFR      Result Value Range   Sodium 142  135 - 145 mEq/L   Potassium 4.6  3.5 - 5.3 mEq/L   Chloride 105  96 - 112 mEq/L   CO2 31  19 - 32 mEq/L   Glucose, Bld 92  70 - 99 mg/dL   BUN 10  6 - 23 mg/dL   Creat 1.19  1.47 - 8.29 mg/dL   Total Bilirubin 0.4  0.3 - 1.2 mg/dL   Alkaline Phosphatase 81  39 - 117 U/L   AST 15  0 - 37 U/L   ALT 9  0 - 53 U/L   Total Protein 6.2  6.0 - 8.3 g/dL   Albumin 4.2  3.5 - 5.2 g/dL   Calcium 9.7  8.4 - 56.2 mg/dL   GFR, Est African American >89     GFR, Est Non African American >89    LIPID PANEL      Result Value Range   Cholesterol 251 (*) 0 - 200 mg/dL   Triglycerides 130 (*) <150 mg/dL   HDL 41  >86 mg/dL   Total CHOL/HDL Ratio 6.1     VLDL 34  0 - 40 mg/dL   LDL Cholesterol 578 (*) 0 - 99 mg/dL  PSA      Result Value Range   PSA 1.20  <=4.00 ng/mL  CBC WITH DIFFERENTIAL      Result Value Range   WBC 10.7 (*) 4.0 - 10.5 K/uL   RBC 4.50  4.22 - 5.81 MIL/uL   Hemoglobin 13.4  13.0 - 17.0 g/dL   HCT 46.9 (*) 62.9 - 52.8 %   MCV  84.7  78.0 - 100.0 fL   MCH 29.8  26.0 - 34.0 pg   MCHC 35.2  30.0 - 36.0 g/dL   RDW 41.3  24.4 - 01.0 %   Platelets 201  150 - 400 K/uL   Neutrophils Relative 41 (*) 43 - 77 %   Neutro Abs 4.4  1.7 - 7.7 K/uL   Lymphocytes Relative 48 (*) 12 - 46 %   Lymphs Abs 5.2 (*) 0.7 - 4.0 K/uL   Monocytes Relative 9  3 - 12 %   Monocytes Absolute 1.0  0.1 - 1.0 K/uL   Eosinophils Relative 2  0 - 5 %   Eosinophils Absolute 0.2  0.0 - 0.7 K/uL   Basophils Relative 0  0 - 1 %   Basophils Absolute 0.0  0.0 - 0.1 K/uL   Smear Review Criteria for review not met    TESTOSTERONE      Result Value Range   Testosterone 939 (*) 300 - 890 ng/dL      ASSESSMENT AND PLAN:  47 y.o. year old male with  1. Effusion of right knee Will refer back to Dr Chaney Malling - Ambulatory referral to Orthopedic Surgery  2. Hypogonadism male Pt says above labs were done day after testerone injection. Prior levels on injection low normal. - testosterone cypionate (DEPOTESTOTERONE CYPIONATE) injection 200 mg; Inject 1 mL (200 mg total) into the muscle once.  3. Acid reflux Controlled with protonix. Cont this.  4. Hyperlipidemia At OV 10/13 we discussed changing Crestor to Pravachol but pt forgot to start Pravachol. Will start this now. Recheck FLP/LFT 6 weeks. As well he is to get back on track with his diet. - pravastatin (PRAVACHOL) 20 MG tablet; Take 1 tablet (20 mg total) by mouth daily.  Dispense: 90 tablet; Refill: 3 - Lipid panel; Future - COMPLETE METABOLIC PANEL WITH GFR; Future  5. Hypertension BP up today but usually well controlled. BMET nml. Cont current med now. If BP remains elevated at f/u, will adjust meds.   6. Hypothyroidism TSH WNL. Cont current dose Synthroid 125 mcg Qd.   7. Testosterone deficiency Cont injections. PSA and other routine labs for monitoring nml. Will need CBC to monitor H/H on testosterone with next lab. This was nml 09/04/12.  8. Smoker Only 3 days per week-see hpi.  Discussed cutting back on those days, trying to quit completely. H/O adv effect with Chantix. Also, he is on multiple psych meds. So will not Rx med for this now.   **NOTE; HE SEES PSYCH AND PAIN CLINIC ON ROUTINE BASIS IN ADDITION TO COMING HERE.   [address] [place], [place], [facility] 03/19/2013 1:59 PM

## 2013-04-02 ENCOUNTER — Ambulatory Visit (INDEPENDENT_AMBULATORY_CARE_PROVIDER_SITE_OTHER): Payer: Medicare Other | Admitting: Family Medicine

## 2013-04-02 DIAGNOSIS — E291 Testicular hypofunction: Secondary | ICD-10-CM

## 2013-04-02 MED ORDER — TESTOSTERONE CYPIONATE 200 MG/ML IM SOLN
200.0000 mg | Freq: Once | INTRAMUSCULAR | Status: AC
  Administered 2013-04-02: 200 mg via INTRAMUSCULAR

## 2013-04-02 NOTE — Progress Notes (Signed)
Injection given

## 2013-04-03 ENCOUNTER — Encounter: Payer: Self-pay | Admitting: Family Medicine

## 2013-04-03 NOTE — Progress Notes (Unsigned)
Patient ID: Tony Campos, male   DOB: 12-Apr-1966, 47 y.o.   MRN: [id number] Pt has appointment with Tomasita Crumble on May 15 at 315 pm, pt is aware

## 2013-04-18 ENCOUNTER — Ambulatory Visit (INDEPENDENT_AMBULATORY_CARE_PROVIDER_SITE_OTHER): Payer: Medicare Other | Admitting: *Deleted

## 2013-04-18 DIAGNOSIS — R7989 Other specified abnormal findings of blood chemistry: Secondary | ICD-10-CM

## 2013-04-18 DIAGNOSIS — E291 Testicular hypofunction: Secondary | ICD-10-CM

## 2013-04-18 MED ORDER — TESTOSTERONE CYPIONATE 200 MG/ML IM SOLN
100.0000 mg | Freq: Once | INTRAMUSCULAR | Status: AC
  Administered 2013-04-18: 100 mg via INTRAMUSCULAR

## 2013-04-30 ENCOUNTER — Ambulatory Visit (INDEPENDENT_AMBULATORY_CARE_PROVIDER_SITE_OTHER): Payer: Medicare Other | Admitting: *Deleted

## 2013-04-30 DIAGNOSIS — E291 Testicular hypofunction: Secondary | ICD-10-CM

## 2013-04-30 DIAGNOSIS — R7989 Other specified abnormal findings of blood chemistry: Secondary | ICD-10-CM

## 2013-04-30 MED ORDER — TESTOSTERONE CYPIONATE 200 MG/ML IM SOLN
200.0000 mg | Freq: Once | INTRAMUSCULAR | Status: AC
  Administered 2013-04-30: 200 mg via INTRAMUSCULAR

## 2013-05-15 ENCOUNTER — Ambulatory Visit (INDEPENDENT_AMBULATORY_CARE_PROVIDER_SITE_OTHER): Payer: Medicare Other | Admitting: *Deleted

## 2013-05-15 DIAGNOSIS — E291 Testicular hypofunction: Secondary | ICD-10-CM

## 2013-05-15 DIAGNOSIS — R7989 Other specified abnormal findings of blood chemistry: Secondary | ICD-10-CM

## 2013-05-15 MED ORDER — TESTOSTERONE CYPIONATE 200 MG/ML IM SOLN
200.0000 mg | Freq: Once | INTRAMUSCULAR | Status: AC
  Administered 2013-05-15: 200 mg via INTRAMUSCULAR

## 2013-05-29 ENCOUNTER — Ambulatory Visit (INDEPENDENT_AMBULATORY_CARE_PROVIDER_SITE_OTHER): Payer: Medicare Other | Admitting: *Deleted

## 2013-05-29 DIAGNOSIS — R7989 Other specified abnormal findings of blood chemistry: Secondary | ICD-10-CM

## 2013-05-29 DIAGNOSIS — E291 Testicular hypofunction: Secondary | ICD-10-CM

## 2013-05-29 MED ORDER — TESTOSTERONE CYPIONATE 200 MG/ML IM SOLN
200.0000 mg | Freq: Once | INTRAMUSCULAR | Status: AC
  Administered 2013-05-29: 200 mg via INTRAMUSCULAR

## 2013-06-09 ENCOUNTER — Telehealth: Payer: Self-pay | Admitting: Family Medicine

## 2013-06-09 MED ORDER — AMLODIPINE BESYLATE 10 MG PO TABS: 10.0000 mg | ORAL_TABLET | Freq: Every day | ORAL | Status: DC

## 2013-06-09 NOTE — Telephone Encounter (Signed)
Med refijlled °

## 2013-06-11 ENCOUNTER — Other Ambulatory Visit: Payer: Self-pay | Admitting: Family Medicine

## 2013-06-11 ENCOUNTER — Ambulatory Visit (INDEPENDENT_AMBULATORY_CARE_PROVIDER_SITE_OTHER): Payer: Medicare Other | Admitting: Family Medicine

## 2013-06-11 DIAGNOSIS — E291 Testicular hypofunction: Secondary | ICD-10-CM

## 2013-06-11 DIAGNOSIS — Z79899 Other long term (current) drug therapy: Secondary | ICD-10-CM

## 2013-06-11 DIAGNOSIS — Z125 Encounter for screening for malignant neoplasm of prostate: Secondary | ICD-10-CM

## 2013-06-11 DIAGNOSIS — E039 Hypothyroidism, unspecified: Secondary | ICD-10-CM

## 2013-06-11 DIAGNOSIS — R7989 Other specified abnormal findings of blood chemistry: Secondary | ICD-10-CM

## 2013-06-11 DIAGNOSIS — E782 Mixed hyperlipidemia: Secondary | ICD-10-CM

## 2013-06-11 DIAGNOSIS — E785 Hyperlipidemia, unspecified: Secondary | ICD-10-CM

## 2013-06-11 LAB — LIPID PANEL
LDL Cholesterol: 80 mg/dL (ref 0–99)
VLDL: 37 mg/dL (ref 0–40)

## 2013-06-11 LAB — CBC WITH DIFFERENTIAL/PLATELET
Basophils Relative: 0 % (ref 0–1)
Eosinophils Absolute: 0.2 10*3/uL (ref 0.0–0.7)
Eosinophils Relative: 2 % (ref 0–5)
Hemoglobin: 12.7 g/dL — ABNORMAL LOW (ref 13.0–17.0)
Lymphs Abs: 3.6 10*3/uL (ref 0.7–4.0)
MCH: 29.1 pg (ref 26.0–34.0)
MCHC: 34 g/dL (ref 30.0–36.0)
MCV: 85.6 fL (ref 78.0–100.0)
Monocytes Relative: 8 % (ref 3–12)
Neutrophils Relative %: 51 % (ref 43–77)

## 2013-06-11 LAB — COMPLETE METABOLIC PANEL WITH GFR
ALT: <8 U/L (ref 0–53)
AST: 14 U/L (ref 0–37)
CO2: 31 mEq/L (ref 19–32)
Calcium: 9.3 mg/dL (ref 8.4–10.5)
Chloride: 104 mEq/L (ref 96–112)
GFR, Est African American: >89 mL/min
Sodium: 141 mEq/L (ref 135–145)
Total Protein: 5.8 g/dL — ABNORMAL LOW (ref 6.0–8.3)

## 2013-06-11 LAB — TSH: TSH: 2.188 u[IU]/mL (ref 0.350–4.500)

## 2013-06-11 MED ORDER — TESTOSTERONE CYPIONATE 200 MG/ML IM SOLN
200.0000 mg | INTRAMUSCULAR | Status: DC
  Administered 2013-06-11 – 2013-07-21 (×3): 200 mg via INTRAMUSCULAR

## 2013-06-11 NOTE — Addendum Note (Signed)
Addended by: WRAY, [place] on: 06/11/2013 11:29 AM   Modules accepted: Orders

## 2013-06-12 NOTE — Progress Notes (Signed)
MBD to discuss at next ov.

## 2013-06-25 ENCOUNTER — Ambulatory Visit (INDEPENDENT_AMBULATORY_CARE_PROVIDER_SITE_OTHER): Payer: Medicare Other | Admitting: Physician Assistant

## 2013-06-25 ENCOUNTER — Encounter: Payer: Self-pay | Admitting: Physician Assistant

## 2013-06-25 VITALS — BP 156/90 | HR 88 | Temp 98.4°F | Resp 18 | Ht 72.0 in | Wt 221.0 lb

## 2013-06-25 DIAGNOSIS — E349 Endocrine disorder, unspecified: Secondary | ICD-10-CM

## 2013-06-25 DIAGNOSIS — I10 Essential (primary) hypertension: Secondary | ICD-10-CM

## 2013-06-25 DIAGNOSIS — F17200 Nicotine dependence, unspecified, uncomplicated: Secondary | ICD-10-CM

## 2013-06-25 DIAGNOSIS — F32A Depression, unspecified: Secondary | ICD-10-CM

## 2013-06-25 DIAGNOSIS — E039 Hypothyroidism, unspecified: Secondary | ICD-10-CM

## 2013-06-25 DIAGNOSIS — F419 Anxiety disorder, unspecified: Secondary | ICD-10-CM

## 2013-06-25 DIAGNOSIS — F411 Generalized anxiety disorder: Secondary | ICD-10-CM

## 2013-06-25 DIAGNOSIS — E785 Hyperlipidemia, unspecified: Secondary | ICD-10-CM

## 2013-06-25 DIAGNOSIS — F329 Major depressive disorder, single episode, unspecified: Secondary | ICD-10-CM

## 2013-06-25 DIAGNOSIS — K219 Gastro-esophageal reflux disease without esophagitis: Secondary | ICD-10-CM

## 2013-06-25 DIAGNOSIS — E291 Testicular hypofunction: Secondary | ICD-10-CM

## 2013-06-25 NOTE — Progress Notes (Signed)
Patient ID: Tony Campos MRN: [id number], DOB: 1966/07/05, 47 y.o. Date of Encounter: @DATE @  Chief Complaint:  Chief Complaint  Patient presents with  . follow up after bloodwork    HPI: 47 y.o. year old white male  presents for routine f/u OV.   1-Smoker: At LOV he reported he was only smoking on days he worked. Says he now isnot working. Ortho told him if he continued that work, he would have to have TKR. He quit that work. He was already on disability sec to his back-since 2008.  Now smoking E-Cigarettes. Has vial to refill-can adjust amount of nicotine in it. He has decreased to amt of "light"cigarette and has decreased frequnecy.  Now he is home all the time.   2-On Disability sec to back since 2008. Goes to pain clinic  3- Sees Dr. Thomasena Edis reg knee. Is sched for arthroscopic surg in August.   4-Testosterone: At last lab, says he had lab drawn, then had injection that day, after lab. (was due for injection at time of that lab) Clinically, this dose seems to be working well. Still c/o some fatigue. Has no problem with anger or aggrression.   5- HLD: At LOV he told me had been off chol med. 03/07/13-labs with Chol # were high. At f/u OV 4/23, Started Pravastatin 20mg . Pt says he is taking chol med daily as directed. No myalgia. No adv effects.   Past Medical History  Diagnosis Date  . Acid reflux   . Anxiety   . Depression   . Hypothyroidism   . Hypertension   . Hyperlipidemia   . Testosterone deficiency   . Spondylosis   . Genital warts   . Smoker      Home Meds: See attached medication section for current medication list. Any medications entered into computer today will not appear on this note's list. The medications listed below were entered prior to today. Current Outpatient Prescriptions on File Prior to Visit  Medication Sig Dispense Refill  . amLODipine (NORVASC) 10 MG tablet Take 1 tablet (10 mg total) by mouth daily.  30 tablet  2  . ARIPiprazole (ABILIFY)  15 MG tablet Take 15 mg by mouth daily.      Marland Kitchen buPROPion (WELLBUTRIN XL) 300 MG 24 hr tablet Take 300 mg by mouth daily.      . clonazePAM (KLONOPIN) 1 MG tablet Take 1 mg by mouth 2 (two) times daily as needed for anxiety.      . lamoTRIgine (LAMICTAL) 200 MG tablet Take 200 mg by mouth daily.      Marland Kitchen levothyroxine (SYNTHROID, LEVOTHROID) 125 MCG tablet Take 125 mcg by mouth daily before breakfast.      . morphine (MSIR) 15 MG tablet Take 1 tablet by mouth 3 (three) times daily as needed.      . pantoprazole (PROTONIX) 40 MG tablet Take 40 mg by mouth at bedtime.      . pravastatin (PRAVACHOL) 20 MG tablet Take 1 tablet (20 mg total) by mouth daily.  90 tablet  3  . testosterone cypionate (DEPOTESTOTERONE CYPIONATE) 200 MG/ML injection Inject 200 mg into the muscle every 14 (fourteen) days.      . fentaNYL (DURAGESIC - DOSED MCG/HR) 50 MCG/HR Place 1 patch onto the skin every 3 (three) days.      . methocarbamol (ROBAXIN) 750 MG tablet Take 750 mg by mouth daily.      . potassium chloride (K-DUR,KLOR-CON) 10 MEQ tablet Take 10 mEq by  mouth daily.      . [DISCONTINUED] esomeprazole (NEXIUM) 40 MG capsule Take 40 mg by mouth daily before breakfast.        . [DISCONTINUED] ibuprofen (ADVIL,MOTRIN) 800 MG tablet Take 800 mg by mouth every 8 (eight) hours as needed.      . [DISCONTINUED] potassium chloride (KLOR-CON) 10 MEQ CR tablet Take 10 mEq by mouth daily.         Current Facility-Administered Medications on File Prior to Visit  Medication Dose Route Frequency Provider Last Rate Last Dose  . testosterone cypionate (DEPOTESTOTERONE CYPIONATE) injection 200 mg  200 mg Intramuscular Q14 Days Donita Brooks, MD   200 mg at 06/25/13 1137    Allergies:  Allergies  Allergen Reactions  . Testosterone     Edema from topical applications    History   Social History  . Marital Status: Married    Spouse Name: N/A    Number of Children: N/A  . Years of Education: N/A   Occupational History   . [employer] at [employer] Part Time    Social History Main Topics  . Smoking status: Current Every Day Smoker -- 1.00 packs/day    Types: Cigarettes  . Smokeless tobacco: Not on file  . Alcohol Use: No  . Drug Use: No  . Sexually Active: Not on file   Other Topics Concern  . Not on file   Social History Narrative  . No narrative on file    Family History  Problem Relation Age of Onset  . Cancer Maternal Grandfather     stomach     Review of Systems:  See HPI for pertinent ROS. All other ROS negative.    Physical Exam: Blood pressure 156/90, pulse 88, temperature 98.4 F (36.9 C), temperature source Oral, resp. rate 18, height 6' (1.829 m), weight 221 lb (100.245 kg)., Body mass index is 29.97 kg/(m^2). General: WNWD WM. Appears in no acute distress. 47 Neck: Supple. No thyromegaly. No lymphadenopathy. No carotid Bruits.  Lungs: Clear bilaterally to auscultation without wheezes, rales, or rhonchi. Breathing is unlabored. Heart: RRR with S1 S2. No murmurs, rubs, or gallops. Abdomen: Soft, non-tender, non-distended with normoactive bowel sounds. No hepatomegaly. No rebound/guarding. No obvious abdominal masses. Musculoskeletal:  Strength and tone normal for age. Extremities/Skin: Warm and dry. No clubbing or cyanosis. No edema. No rashes or suspicious lesions. Neuro: Alert and oriented X 3. Moves all extremities spontaneously. Gait is normal. CNII-XII grossly in tact. Psych:  Responds to questions appropriately with a normal affect.   Results for orders placed in visit on 06/11/13  LIPID PANEL      Result Value Range   Cholesterol 148  0 - 200 mg/dL   Triglycerides 161 (*) <150 mg/dL   HDL 31 (*) >09 mg/dL   Total CHOL/HDL Ratio 4.8     VLDL 37  0 - 40 mg/dL   LDL Cholesterol 80  0 - 99 mg/dL  COMPLETE METABOLIC PANEL WITH GFR      Result Value Range   Sodium 141  135 - 145 mEq/L   Potassium 4.9  3.5 - 5.3 mEq/L   Chloride 104  96 - 112 mEq/L   CO2 31  19 - 32  mEq/L   Glucose, Bld 84  70 - 99 mg/dL   BUN 8  6 - 23 mg/dL   Creat 6.04  5.40 - 9.81 mg/dL   Total Bilirubin 0.3  0.3 - 1.2 mg/dL   Alkaline Phosphatase 72  39 - 117 U/L   AST 14  0 - 37 U/L   ALT <8  0 - 53 U/L   Total Protein 5.8 (*) 6.0 - 8.3 g/dL   Albumin 3.9  3.5 - 5.2 g/dL   Calcium 9.3  8.4 - 16.1 mg/dL   GFR, Est African American >89     GFR, Est Non African American 79    CBC WITH DIFFERENTIAL      Result Value Range   WBC 9.1  4.0 - 10.5 K/uL   RBC 4.36  4.22 - 5.81 MIL/uL   Hemoglobin 12.7 (*) 13.0 - 17.0 g/dL   HCT 09.6 (*) 04.5 - 40.9 %   MCV 85.6  78.0 - 100.0 fL   MCH 29.1  26.0 - 34.0 pg   MCHC 34.0  30.0 - 36.0 g/dL   RDW 81.1  91.4 - 78.2 %   Platelets 227  150 - 400 K/uL   Neutrophils Relative % 51  43 - 77 %   Neutro Abs 4.6  1.7 - 7.7 K/uL   Lymphocytes Relative 39  12 - 46 %   Lymphs Abs 3.6  0.7 - 4.0 K/uL   Monocytes Relative 8  3 - 12 %   Monocytes Absolute 0.7  0.1 - 1.0 K/uL   Eosinophils Relative 2  0 - 5 %   Eosinophils Absolute 0.2  0.0 - 0.7 K/uL   Basophils Relative 0  0 - 1 %   Basophils Absolute 0.0  0.0 - 0.1 K/uL   Smear Review Criteria for review not met    TSH      Result Value Range   TSH 2.188  0.350 - 4.500 uIU/mL  PSA, MEDICARE      Result Value Range   PSA 0.99  <=4.00 ng/mL  TESTOSTERONE      Result Value Range   Testosterone 360  300 - 890 ng/dL     ASSESSMENT AND PLAN:  47 y.o. year old male with  1. Testosterone deficiency Cont current dose of Testosterone injections  2. Hypertension BP HIGH-NEED TO INCREASE MED AT NEXT OV IF BP REMAINS HIGH  3. Hyperlipidemia At Goal. COnt current med.   4. Hypothyroidism TSH nml. Cont Current dose.   5. Smoker Cont cessation.  6. Acid reflux Controlled. Cont current med.   7. Depression  SEES PSYCH 8. Anxiety  9. Chronic Back Pain: [facility]   Signed, Shon Hale [place], [place], [facility] 06/25/2013 12:18 PM

## 2013-07-03 ENCOUNTER — Ambulatory Visit (INDEPENDENT_AMBULATORY_CARE_PROVIDER_SITE_OTHER): Payer: Medicare Other | Admitting: Family Medicine

## 2013-07-03 ENCOUNTER — Encounter: Payer: Self-pay | Admitting: Family Medicine

## 2013-07-03 VITALS — BP 130/70 | HR 78 | Temp 97.8°F | Resp 18 | Wt 228.0 lb

## 2013-07-03 DIAGNOSIS — J019 Acute sinusitis, unspecified: Secondary | ICD-10-CM

## 2013-07-03 MED ORDER — AMOXICILLIN-POT CLAVULANATE 875-125 MG PO TABS: 1.0000 | ORAL_TABLET | Freq: Two times a day (BID) | ORAL | Status: DC

## 2013-07-03 MED ORDER — FLUTICASONE PROPIONATE 50 MCG/ACT NA SUSP: 2.0000 | Freq: Every day | NASAL | Status: DC

## 2013-07-03 NOTE — Progress Notes (Signed)
Subjective:    Patient ID: Tony Campos, male    DOB: [birth year], 47 y.o.   MRN: [id number]  HPI Patient reports 2 weeks of sinus congestion. He reports pain and pressure in his maxillary and frontal sinus areas. He reports copious constant rhinorrhea the reports postnasal drip. He reports mucopurulent nasal discharge. He reports a constant headache. He reports fevers and chills. He is scheduled to have arthroscopic knee surgery for torn meniscus next week and he is concerned they may delay his surgery if he doesn't have infection treated. Past Medical History  Diagnosis Date  . Acid reflux   . Anxiety   . Depression   . Hypothyroidism   . Hypertension   . Hyperlipidemia   . Testosterone deficiency   . Spondylosis   . Genital warts   . Smoker    Past Surgical History  Procedure Laterality Date  . Knee surgery      right  . Wrist surgery      right and left  . Cholecystectomy  2007  . Nasal septum surgery    . Nasal sinus surgery     Current Outpatient Prescriptions on File Prior to Visit  Medication Sig Dispense Refill  . amLODipine (NORVASC) 10 MG tablet Take 1 tablet (10 mg total) by mouth daily.  30 tablet  2  . ARIPiprazole (ABILIFY) 15 MG tablet Take 15 mg by mouth daily.      Marland Kitchen buPROPion (WELLBUTRIN XL) 300 MG 24 hr tablet Take 300 mg by mouth daily.      . clonazePAM (KLONOPIN) 1 MG tablet Take 1 mg by mouth 2 (two) times daily as needed for anxiety.      . fentaNYL (DURAGESIC - DOSED MCG/HR) 50 MCG/HR Place 1 patch onto the skin every 3 (three) days.      Marland Kitchen lamoTRIgine (LAMICTAL) 200 MG tablet Take 200 mg by mouth daily.      Marland Kitchen levothyroxine (SYNTHROID, LEVOTHROID) 125 MCG tablet Take 125 mcg by mouth daily before breakfast.      . methocarbamol (ROBAXIN) 750 MG tablet Take 750 mg by mouth daily.      Marland Kitchen morphine (MS CONTIN) 60 MG 12 hr tablet Take 60 mg by mouth 3 (three) times daily.      Marland Kitchen morphine (MSIR) 15 MG tablet Take 1 tablet by mouth 3 (three) times daily  as needed.      . pantoprazole (PROTONIX) 40 MG tablet Take 40 mg by mouth at bedtime.      . potassium chloride (K-DUR,KLOR-CON) 10 MEQ tablet Take 10 mEq by mouth daily.      . pravastatin (PRAVACHOL) 20 MG tablet Take 1 tablet (20 mg total) by mouth daily.  90 tablet  3  . testosterone cypionate (DEPOTESTOTERONE CYPIONATE) 200 MG/ML injection Inject 200 mg into the muscle every 14 (fourteen) days.      Marland Kitchen tiZANidine (ZANAFLEX) 4 MG tablet Take 2 tablets by mouth 3 (three) times daily.      . [DISCONTINUED] esomeprazole (NEXIUM) 40 MG capsule Take 40 mg by mouth daily before breakfast.        . [DISCONTINUED] ibuprofen (ADVIL,MOTRIN) 800 MG tablet Take 800 mg by mouth every 8 (eight) hours as needed.      . [DISCONTINUED] potassium chloride (KLOR-CON) 10 MEQ CR tablet Take 10 mEq by mouth daily.         Current Facility-Administered Medications on File Prior to Visit  Medication Dose Route Frequency Provider Last Rate Last Dose  .  testosterone cypionate (DEPOTESTOTERONE CYPIONATE) injection 200 mg  200 mg Intramuscular Q14 Days Donita Brooks, MD   200 mg at 06/25/13 1137   Allergies  Allergen Reactions  . Testosterone     Edema from topical applications   History   Social History  . Marital Status: Married    Spouse Name: N/A    Number of Children: N/A  . Years of Education: N/A   Occupational History  . [employer] at [employer] Part Time    Social History Main Topics  . Smoking status: Current Every Day Smoker -- 1.00 packs/day    Types: Cigarettes  . Smokeless tobacco: Not on file  . Alcohol Use: No  . Drug Use: No  . Sexually Active: Not on file   Other Topics Concern  . Not on file   Social History Narrative  . No narrative on file      Review of Systems  All other systems reviewed and are negative.       Objective:   Physical Exam  Vitals reviewed. Constitutional: He appears well-developed and well-nourished.  HENT:  Right Ear: External ear  normal.  Left Ear: External ear normal.  Nose: Mucosal edema and rhinorrhea present. Right sinus exhibits maxillary sinus tenderness and frontal sinus tenderness. Left sinus exhibits maxillary sinus tenderness and frontal sinus tenderness.  Mouth/Throat: Oropharynx is clear and moist.  Eyes: Conjunctivae are normal.  Cardiovascular: Normal rate, regular rhythm and normal heart sounds.  Exam reveals no gallop and no friction rub.   No murmur heard. Pulmonary/Chest: Effort normal and breath sounds normal. No respiratory distress. He has no wheezes. He has no rales.  Abdominal: Soft. Bowel sounds are normal. He exhibits no distension. There is no tenderness. There is no rebound.          Assessment & Plan:  1. Acute rhinosinusitis Believe the patient has an acute sinus infection. Recommend Augmentin twice a day for 10 days and Flonase nasal spray 2 sprays each nostril daily. Followup in one week if no better or sooner if worse. - amoxicillin-clavulanate (AUGMENTIN) 875-125 MG per tablet; Take 1 tablet by mouth 2 (two) times daily.  Dispense: 20 tablet; Refill: 0 - fluticasone (FLONASE) 50 MCG/ACT nasal spray; Place 2 sprays into the nose daily.  Dispense: 16 g; Refill: 6

## 2013-07-07 ENCOUNTER — Ambulatory Visit (INDEPENDENT_AMBULATORY_CARE_PROVIDER_SITE_OTHER): Payer: Medicare Other | Admitting: Family Medicine

## 2013-07-07 DIAGNOSIS — E349 Endocrine disorder, unspecified: Secondary | ICD-10-CM

## 2013-07-07 DIAGNOSIS — E291 Testicular hypofunction: Secondary | ICD-10-CM

## 2013-07-21 ENCOUNTER — Ambulatory Visit (INDEPENDENT_AMBULATORY_CARE_PROVIDER_SITE_OTHER): Payer: Medicare Other | Admitting: Family Medicine

## 2013-07-21 DIAGNOSIS — E291 Testicular hypofunction: Secondary | ICD-10-CM

## 2013-07-21 DIAGNOSIS — E349 Endocrine disorder, unspecified: Secondary | ICD-10-CM

## 2013-08-07 ENCOUNTER — Ambulatory Visit (INDEPENDENT_AMBULATORY_CARE_PROVIDER_SITE_OTHER): Payer: Medicare Other | Admitting: *Deleted

## 2013-08-07 DIAGNOSIS — R7989 Other specified abnormal findings of blood chemistry: Secondary | ICD-10-CM

## 2013-08-07 DIAGNOSIS — E291 Testicular hypofunction: Secondary | ICD-10-CM

## 2013-08-07 MED ORDER — TESTOSTERONE CYPIONATE 200 MG/ML IM SOLN
200.0000 mg | Freq: Once | INTRAMUSCULAR | Status: AC
  Administered 2013-08-07: 200 mg via INTRAMUSCULAR

## 2013-08-25 ENCOUNTER — Telehealth: Payer: Self-pay | Admitting: Family Medicine

## 2013-08-25 ENCOUNTER — Other Ambulatory Visit: Payer: Self-pay | Admitting: Family Medicine

## 2013-08-25 DIAGNOSIS — J019 Acute sinusitis, unspecified: Secondary | ICD-10-CM

## 2013-08-25 MED ORDER — AMOXICILLIN-POT CLAVULANATE 875-125 MG PO TABS: 1.0000 | ORAL_TABLET | Freq: Two times a day (BID) | ORAL | Status: DC

## 2013-08-25 NOTE — Telephone Encounter (Signed)
I will escribe augmentim.  NTBS if no better.

## 2013-08-25 NOTE — Telephone Encounter (Signed)
Pt has the same thing that he had at his last visit with you in August. He wants to know if you will give him another round of Augmentin.

## 2013-08-25 NOTE — Telephone Encounter (Signed)
Pt aware.

## 2013-09-18 ENCOUNTER — Telehealth: Payer: Self-pay | Admitting: Family Medicine

## 2013-09-18 MED ORDER — AMLODIPINE BESYLATE 10 MG PO TABS: 10.0000 mg | ORAL_TABLET | Freq: Every day | ORAL | Status: DC

## 2013-09-18 NOTE — Telephone Encounter (Signed)
Amlodipine 10 mg

## 2013-09-22 ENCOUNTER — Telehealth: Payer: Self-pay | Admitting: Family Medicine

## 2013-09-22 MED ORDER — AMLODIPINE BESYLATE 10 MG PO TABS: 10.0000 mg | ORAL_TABLET | Freq: Every day | ORAL | Status: DC

## 2013-09-22 NOTE — Telephone Encounter (Signed)
Done

## 2013-09-22 NOTE — Telephone Encounter (Signed)
Patients refill on his NORVASC was sent to the wrong pharmacy . Was sent to [facility] in [place] . Was supposed to be sent to [facility].  ASAP . Patient is out of his medicine .  [facility].  [phone]

## 2013-10-02 ENCOUNTER — Ambulatory Visit (INDEPENDENT_AMBULATORY_CARE_PROVIDER_SITE_OTHER): Payer: Medicare Other | Admitting: *Deleted

## 2013-10-02 DIAGNOSIS — Z23 Encounter for immunization: Secondary | ICD-10-CM

## 2013-10-27 ENCOUNTER — Ambulatory Visit (INDEPENDENT_AMBULATORY_CARE_PROVIDER_SITE_OTHER): Payer: Medicare Other | Admitting: Family Medicine

## 2013-10-27 ENCOUNTER — Encounter: Payer: Self-pay | Admitting: Family Medicine

## 2013-10-27 VITALS — BP 126/72 | HR 86 | Temp 97.9°F | Resp 16 | Ht 72.0 in | Wt 244.0 lb

## 2013-10-27 DIAGNOSIS — R0600 Dyspnea, unspecified: Secondary | ICD-10-CM

## 2013-10-27 DIAGNOSIS — R0609 Other forms of dyspnea: Secondary | ICD-10-CM

## 2013-10-27 DIAGNOSIS — R0989 Other specified symptoms and signs involving the circulatory and respiratory systems: Secondary | ICD-10-CM

## 2013-10-27 DIAGNOSIS — G039 Meningitis, unspecified: Secondary | ICD-10-CM

## 2013-10-27 DIAGNOSIS — R5381 Other malaise: Secondary | ICD-10-CM

## 2013-10-27 DIAGNOSIS — R609 Edema, unspecified: Secondary | ICD-10-CM

## 2013-10-27 HISTORY — DX: Meningitis, unspecified: G03.9

## 2013-10-27 LAB — CBC WITH DIFFERENTIAL/PLATELET
Basophils Relative: 0 % (ref 0–1)
Eosinophils Absolute: 0.2 10*3/uL (ref 0.0–0.7)
Eosinophils Relative: 2 % (ref 0–5)
Lymphs Abs: 4 10*3/uL (ref 0.7–4.0)
MCH: 29.5 pg (ref 26.0–34.0)
MCHC: 34.5 g/dL (ref 30.0–36.0)
MCV: 85.4 fL (ref 78.0–100.0)
Platelets: 232 10*3/uL (ref 150–400)
RBC: 4.65 MIL/uL (ref 4.22–5.81)

## 2013-10-27 NOTE — Progress Notes (Signed)
Subjective:    Patient ID: Tony Campos, male    DOB: 05-Aug-1966, 47 y.o.   MRN: [id number]  HPI Patient has gained 14 pounds in last 2 months. He now has +2 pitting edema to the level of the knees. He reports dyspnea on exertion he has a past medical history of hypertension hyperlipidemia and smoking. He denies any chest pain. He denies any history of kidney problems or liver problems. Possibly the same time he has recently started zyprexa.  He is also taking amlodipine although he has been on this medication for years and never had edema until now. Past Medical History  Diagnosis Date  . Acid reflux   . Anxiety   . Depression   . Hypothyroidism   . Hypertension   . Hyperlipidemia   . Testosterone deficiency   . Spondylosis   . Genital warts   . Smoker    Past Surgical History  Procedure Laterality Date  . Knee surgery      right  . Wrist surgery      right and left  . Cholecystectomy  2007  . Nasal septum surgery    . Nasal sinus surgery     Current Outpatient Prescriptions on File Prior to Visit  Medication Sig Dispense Refill  . amLODipine (NORVASC) 10 MG tablet Take 1 tablet (10 mg total) by mouth daily.  90 tablet  3  . amoxicillin-clavulanate (AUGMENTIN) 875-125 MG per tablet Take 1 tablet by mouth 2 (two) times daily.  20 tablet  0  . ARIPiprazole (ABILIFY) 15 MG tablet Take 15 mg by mouth daily.      Marland Kitchen buPROPion (WELLBUTRIN XL) 300 MG 24 hr tablet Take 300 mg by mouth daily.      . clonazePAM (KLONOPIN) 1 MG tablet Take 1 mg by mouth 2 (two) times daily as needed for anxiety.      . fentaNYL (DURAGESIC - DOSED MCG/HR) 50 MCG/HR Place 1 patch onto the skin every 3 (three) days.      . fluticasone (FLONASE) 50 MCG/ACT nasal spray Place 2 sprays into the nose daily.  16 g  6  . lamoTRIgine (LAMICTAL) 200 MG tablet Take 200 mg by mouth daily.      Marland Kitchen levothyroxine (SYNTHROID, LEVOTHROID) 125 MCG tablet Take 125 mcg by mouth daily before breakfast.      . morphine (MS  CONTIN) 60 MG 12 hr tablet Take 60 mg by mouth 3 (three) times daily.      Marland Kitchen morphine (MSIR) 15 MG tablet Take 1 tablet by mouth 3 (three) times daily as needed.      . pantoprazole (PROTONIX) 40 MG tablet Take 40 mg by mouth at bedtime.      . potassium chloride (K-DUR,KLOR-CON) 10 MEQ tablet Take 10 mEq by mouth daily.      . pravastatin (PRAVACHOL) 20 MG tablet Take 1 tablet (20 mg total) by mouth daily.  90 tablet  3  . testosterone cypionate (DEPOTESTOTERONE CYPIONATE) 200 MG/ML injection Inject 200 mg into the muscle every 14 (fourteen) days.      Marland Kitchen tiZANidine (ZANAFLEX) 4 MG tablet Take 2 tablets by mouth 3 (three) times daily.      . [DISCONTINUED] esomeprazole (NEXIUM) 40 MG capsule Take 40 mg by mouth daily before breakfast.        . [DISCONTINUED] ibuprofen (ADVIL,MOTRIN) 800 MG tablet Take 800 mg by mouth every 8 (eight) hours as needed.      . [DISCONTINUED] potassium chloride (  KLOR-CON) 10 MEQ CR tablet Take 10 mEq by mouth daily.         Current Facility-Administered Medications on File Prior to Visit  Medication Dose Route Frequency Provider Last Rate Last Dose  . testosterone cypionate (DEPOTESTOTERONE CYPIONATE) injection 200 mg  200 mg Intramuscular Q14 Days Donita Brooks, MD   200 mg at 07/21/13 0944   Allergies  Allergen Reactions  . Testosterone     Edema from topical applications   History   Social History  . Marital Status: Married    Spouse Name: N/A    Number of Children: N/A  . Years of Education: N/A   Occupational History  . [employer] at [employer] Part Time    Social History Main Topics  . Smoking status: Current Every Day Smoker -- 1.00 packs/day    Types: Cigarettes  . Smokeless tobacco: Not on file  . Alcohol Use: No  . Drug Use: No  . Sexual Activity: Not on file   Other Topics Concern  . Not on file   Social History Narrative  . No narrative on file      Review of Systems  All other systems reviewed and are  negative.       Objective:   Physical Exam  Vitals reviewed. Constitutional: He appears well-developed and well-nourished.  Neck: Neck supple. No JVD present. No thyromegaly present.  Cardiovascular: Normal rate, regular rhythm, normal heart sounds and intact distal pulses.   No murmur heard. Pulmonary/Chest: Effort normal and breath sounds normal. No respiratory distress. He has no wheezes. He has no rales. He exhibits no tenderness.  Abdominal: Soft. Bowel sounds are normal. He exhibits no distension. There is no tenderness. There is no rebound and no guarding.  Musculoskeletal: He exhibits edema.  Lymphadenopathy:    He has no cervical adenopathy.  Patient has +2 edema to the level of the knees bilaterally        Assessment & Plan:  Other malaise and fatigue - Plan: COMPLETE METABOLIC PANEL WITH GFR, TSH, Vitamin B12, Testosterone, CBC with Differential, 2D Echocardiogram without contrast  Dyspnea on exertion - Plan: 2D Echocardiogram without contrast  Edema - Plan: 2D Echocardiogram without contrast  I will check a CBC, CMP, TSH, B12, and testosterone level to evaluate the patient's fatigue. I am concerned by the marked weight gain, the marked edema, and shortness of breath. Therefore I will obtain an EKG of the heart as well as an echocardiogram to rule out structural damage to the heart as well as congestive heart failure. If the patient's labs returned normal I start the patient on a diuretic such as Lasix 40 mg a day PRN until echo is done.  EKG shows normal sinus rhythm at 81 beats per minute with normal axis and normal intervals. There is no evidence of ischemia or infarction although the patient does have nonspecific ST changes in lead aVL.

## 2013-10-28 ENCOUNTER — Encounter: Payer: Self-pay | Admitting: Family Medicine

## 2013-10-28 LAB — COMPLETE METABOLIC PANEL WITH GFR
Albumin: 3.9 g/dL (ref 3.5–5.2)
BUN: 8 mg/dL (ref 6–23)
CO2: 30 mEq/L (ref 19–32)
Calcium: 9.4 mg/dL (ref 8.4–10.5)
Chloride: 102 mEq/L (ref 96–112)
GFR, Est African American: 86 mL/min
GFR, Est Non African American: 74 mL/min
Glucose, Bld: 87 mg/dL (ref 70–99)
Potassium: 4.3 mEq/L (ref 3.5–5.3)

## 2013-10-28 LAB — TSH: TSH: 3.943 u[IU]/mL (ref 0.350–4.500)

## 2013-10-28 NOTE — Telephone Encounter (Signed)
Pt is calling back about his lab results Call back number is [phone]

## 2013-10-29 ENCOUNTER — Other Ambulatory Visit: Payer: Self-pay | Admitting: Family Medicine

## 2013-10-29 MED ORDER — POTASSIUM CHLORIDE CRYS ER 20 MEQ PO TBCR: 20.0000 meq | EXTENDED_RELEASE_TABLET | Freq: Every day | ORAL | Status: DC

## 2013-10-29 NOTE — Telephone Encounter (Signed)
This encounter was created in error - please disregard.

## 2013-10-29 NOTE — Telephone Encounter (Signed)
Med sent to pharm 

## 2013-10-30 ENCOUNTER — Encounter: Payer: Self-pay | Admitting: Family Medicine

## 2013-10-30 ENCOUNTER — Other Ambulatory Visit: Payer: Self-pay | Admitting: Family Medicine

## 2013-10-30 MED ORDER — FUROSEMIDE 40 MG PO TABS: 40.0000 mg | ORAL_TABLET | Freq: Every day | ORAL | Status: DC

## 2013-10-30 NOTE — Telephone Encounter (Signed)
Med sent to pharm 

## 2013-10-30 NOTE — Telephone Encounter (Signed)
Pt has called several times today trying to get intouch with you  Call back number is [phone]

## 2013-10-30 NOTE — Telephone Encounter (Signed)
This encounter was created in error - please disregard.

## 2013-11-05 ENCOUNTER — Other Ambulatory Visit (INDEPENDENT_AMBULATORY_CARE_PROVIDER_SITE_OTHER): Payer: Medicare Other

## 2013-11-05 ENCOUNTER — Other Ambulatory Visit: Payer: Self-pay

## 2013-11-05 DIAGNOSIS — R0600 Dyspnea, unspecified: Secondary | ICD-10-CM

## 2013-11-05 DIAGNOSIS — R5381 Other malaise: Secondary | ICD-10-CM

## 2013-11-05 DIAGNOSIS — R0989 Other specified symptoms and signs involving the circulatory and respiratory systems: Secondary | ICD-10-CM

## 2013-11-05 DIAGNOSIS — R609 Edema, unspecified: Secondary | ICD-10-CM

## 2013-11-05 DIAGNOSIS — R079 Chest pain, unspecified: Secondary | ICD-10-CM

## 2013-11-05 DIAGNOSIS — R0609 Other forms of dyspnea: Secondary | ICD-10-CM

## 2013-11-06 ENCOUNTER — Ambulatory Visit (INDEPENDENT_AMBULATORY_CARE_PROVIDER_SITE_OTHER): Payer: Medicare Other | Admitting: Family Medicine

## 2013-11-06 ENCOUNTER — Encounter: Payer: Self-pay | Admitting: Family Medicine

## 2013-11-06 VITALS — BP 126/72 | HR 86 | Temp 99.0°F | Resp 18 | Wt 242.0 lb

## 2013-11-06 DIAGNOSIS — L0291 Cutaneous abscess, unspecified: Secondary | ICD-10-CM

## 2013-11-06 DIAGNOSIS — L0390 Cellulitis, unspecified: Secondary | ICD-10-CM

## 2013-11-06 MED ORDER — SULFAMETHOXAZOLE-TMP DS 800-160 MG PO TABS: 1.0000 | ORAL_TABLET | Freq: Two times a day (BID) | ORAL | Status: DC

## 2013-11-06 NOTE — Progress Notes (Signed)
Subjective:    Patient ID: Tony Campos, male    DOB: 1966/07/06, 47 y.o.   MRN: [id number]  HPI One week ago, the patient developed a scratch on the side of the shaft of his penis. Shortly thereafter, he described a cluster of "water blisters" occurred in the same area. These ruptured after one day, and then the skin around it became erythematous hot and tender to the touch. Currently there are 2 sores on the left shaft of the penis. They are scabbed over in the base. The surrounding skin appears to be cellulitic being that it is erythematous warm to the touch and tender. This involves the entire shaft of the penis. The patient is adamant that he has had no exposure to herpes. Past Medical History  Diagnosis Date  . Acid reflux   . Anxiety   . Depression   . Hypothyroidism   . Hypertension   . Hyperlipidemia   . Testosterone deficiency   . Spondylosis   . Genital warts   . Smoker    Current Outpatient Prescriptions on File Prior to Visit  Medication Sig Dispense Refill  . amLODipine (NORVASC) 10 MG tablet Take 1 tablet (10 mg total) by mouth daily.  90 tablet  3  . ARIPiprazole (ABILIFY) 15 MG tablet Take 15 mg by mouth daily.      Marland Kitchen buPROPion (WELLBUTRIN XL) 300 MG 24 hr tablet Take 300 mg by mouth daily.      . clonazePAM (KLONOPIN) 1 MG tablet Take 1 mg by mouth 2 (two) times daily as needed for anxiety.      . fentaNYL (DURAGESIC - DOSED MCG/HR) 50 MCG/HR Place 1 patch onto the skin every 3 (three) days.      . fluticasone (FLONASE) 50 MCG/ACT nasal spray Place 2 sprays into the nose daily.  16 g  6  . furosemide (LASIX) 40 MG tablet Take 1 tablet (40 mg total) by mouth daily.  30 tablet  3  . lamoTRIgine (LAMICTAL) 200 MG tablet Take 200 mg by mouth daily.      Marland Kitchen levothyroxine (SYNTHROID, LEVOTHROID) 125 MCG tablet Take 125 mcg by mouth daily before breakfast.      . morphine (MS CONTIN) 60 MG 12 hr tablet Take 60 mg by mouth 3 (three) times daily.      Marland Kitchen morphine (MSIR) 15  MG tablet Take 1 tablet by mouth 3 (three) times daily as needed.      . pantoprazole (PROTONIX) 40 MG tablet Take 40 mg by mouth at bedtime.      . potassium chloride SA (K-DUR,KLOR-CON) 20 MEQ tablet Take 1 tablet (20 mEq total) by mouth daily.  30 tablet  3  . pravastatin (PRAVACHOL) 20 MG tablet Take 1 tablet (20 mg total) by mouth daily.  90 tablet  3  . testosterone cypionate (DEPOTESTOTERONE CYPIONATE) 200 MG/ML injection Inject 200 mg into the muscle every 14 (fourteen) days.      Marland Kitchen tiZANidine (ZANAFLEX) 4 MG tablet Take 2 tablets by mouth 3 (three) times daily.      . [DISCONTINUED] esomeprazole (NEXIUM) 40 MG capsule Take 40 mg by mouth daily before breakfast.        . [DISCONTINUED] ibuprofen (ADVIL,MOTRIN) 800 MG tablet Take 800 mg by mouth every 8 (eight) hours as needed.      . [DISCONTINUED] potassium chloride (KLOR-CON) 10 MEQ CR tablet Take 10 mEq by mouth daily.         Current Facility-Administered  Medications on File Prior to Visit  Medication Dose Route Frequency Provider Last Rate Last Dose  . testosterone cypionate (DEPOTESTOTERONE CYPIONATE) injection 200 mg  200 mg Intramuscular Q14 Days Donita Brooks, MD   200 mg at 07/21/13 0944   Allergies  Allergen Reactions  . Testosterone     Edema from topical applications   History   Social History  . Marital Status: Married    Spouse Name: N/A    Number of Children: N/A  . Years of Education: N/A   Occupational History  . [employer] at [employer] Part Time    Social History Main Topics  . Smoking status: Current Every Day Smoker -- 1.00 packs/day    Types: Cigarettes  . Smokeless tobacco: Not on file  . Alcohol Use: No  . Drug Use: No  . Sexual Activity: Not on file   Other Topics Concern  . Not on file   Social History Narrative  . No narrative on file      Review of Systems  All other systems reviewed and are negative.       Objective:   Physical Exam  Vitals  reviewed. Cardiovascular: Normal rate and regular rhythm.   Pulmonary/Chest: Effort normal and breath sounds normal.          Assessment & Plan:  1. Cellulitis I believe the patient had a herpes infection. He is adamant that he has not been exposed. In any regards, it has been more weak since the blisters appeared. Also there appears to be a secondary cellulitis that has developed in that area. Treat the secondary cellulitis with Bactrim one tablet by mouth twice a day for 7 days. This is to cover possible bullous impetigo as another possible cause of the "water blisters".  If the rash does not improve he is to return immediately. If the rash returns I would perform bowel testing for HSV he at the present time the lesions are no longer draining and scabbed over and therefore I do not feel that the HSV test will be accurate. - sulfamethoxazole-trimethoprim (BACTRIM DS) 800-160 MG per tablet; Take 1 tablet by mouth 2 (two) times daily.  Dispense: 14 tablet; Refill: 0

## 2013-11-10 ENCOUNTER — Emergency Department (HOSPITAL_COMMUNITY): Payer: Medicare Other

## 2013-11-10 ENCOUNTER — Encounter (HOSPITAL_COMMUNITY): Payer: Self-pay | Admitting: Emergency Medicine

## 2013-11-10 ENCOUNTER — Inpatient Hospital Stay (HOSPITAL_COMMUNITY)
Admission: EM | Admit: 2013-11-10 | Discharge: 2013-11-14 | DRG: 076 | Disposition: A | Discharge status: Home / Self Care | Payer: Medicare Other | Attending: Internal Medicine | Admitting: Internal Medicine

## 2013-11-10 ENCOUNTER — Inpatient Hospital Stay (HOSPITAL_COMMUNITY): Payer: Medicare Other

## 2013-11-10 DIAGNOSIS — N489 Disorder of penis, unspecified: Secondary | ICD-10-CM | POA: Diagnosis present

## 2013-11-10 DIAGNOSIS — F419 Anxiety disorder, unspecified: Secondary | ICD-10-CM

## 2013-11-10 DIAGNOSIS — Z79899 Other long term (current) drug therapy: Secondary | ICD-10-CM

## 2013-11-10 DIAGNOSIS — F17200 Nicotine dependence, unspecified, uncomplicated: Secondary | ICD-10-CM | POA: Diagnosis present

## 2013-11-10 DIAGNOSIS — F411 Generalized anxiety disorder: Secondary | ICD-10-CM | POA: Diagnosis present

## 2013-11-10 DIAGNOSIS — G039 Meningitis, unspecified: Secondary | ICD-10-CM

## 2013-11-10 DIAGNOSIS — M479 Spondylosis, unspecified: Secondary | ICD-10-CM | POA: Diagnosis present

## 2013-11-10 DIAGNOSIS — F32A Depression, unspecified: Secondary | ICD-10-CM | POA: Diagnosis present

## 2013-11-10 DIAGNOSIS — N4889 Other specified disorders of penis: Secondary | ICD-10-CM

## 2013-11-10 DIAGNOSIS — K219 Gastro-esophageal reflux disease without esophagitis: Secondary | ICD-10-CM | POA: Diagnosis present

## 2013-11-10 DIAGNOSIS — Z23 Encounter for immunization: Secondary | ICD-10-CM

## 2013-11-10 DIAGNOSIS — I10 Essential (primary) hypertension: Secondary | ICD-10-CM | POA: Diagnosis present

## 2013-11-10 DIAGNOSIS — R519 Headache, unspecified: Secondary | ICD-10-CM

## 2013-11-10 DIAGNOSIS — F3289 Other specified depressive episodes: Secondary | ICD-10-CM | POA: Diagnosis present

## 2013-11-10 DIAGNOSIS — E039 Hypothyroidism, unspecified: Secondary | ICD-10-CM | POA: Diagnosis present

## 2013-11-10 DIAGNOSIS — R7989 Other specified abnormal findings of blood chemistry: Secondary | ICD-10-CM

## 2013-11-10 DIAGNOSIS — R51 Headache: Secondary | ICD-10-CM

## 2013-11-10 DIAGNOSIS — F329 Major depressive disorder, single episode, unspecified: Secondary | ICD-10-CM

## 2013-11-10 DIAGNOSIS — A6000 Herpesviral infection of urogenital system, unspecified: Secondary | ICD-10-CM | POA: Diagnosis present

## 2013-11-10 DIAGNOSIS — E785 Hyperlipidemia, unspecified: Secondary | ICD-10-CM | POA: Diagnosis present

## 2013-11-10 DIAGNOSIS — R0902 Hypoxemia: Secondary | ICD-10-CM | POA: Diagnosis present

## 2013-11-10 DIAGNOSIS — D72829 Elevated white blood cell count, unspecified: Secondary | ICD-10-CM | POA: Diagnosis present

## 2013-11-10 DIAGNOSIS — B003 Herpesviral meningitis: Secondary | ICD-10-CM

## 2013-11-10 LAB — CSF CELL COUNT WITH DIFFERENTIAL
Lymphs, CSF: 74 % (ref 40–80)
Lymphs, CSF: 75 % (ref 40–80)
Monocyte-Macrophage-Spinal Fluid: 24 % (ref 15–45)
Monocyte-Macrophage-Spinal Fluid: 25 % (ref 15–45)
RBC Count, CSF: 16 /mm3 — ABNORMAL HIGH
Segmented Neutrophils-CSF: 1 % (ref 0–6)
Segmented Neutrophils-CSF: 1 % (ref 0–6)
WBC, CSF: 272 /mm3 (ref 0–5)
WBC, CSF: 379 /mm3 (ref 0–5)

## 2013-11-10 LAB — GLUCOSE, CSF: Glucose, CSF: 65 mg/dL (ref 43–76)

## 2013-11-10 LAB — CBC WITH DIFFERENTIAL/PLATELET
Basophils Absolute: 0 10*3/uL (ref 0.0–0.1)
Eosinophils Absolute: 0 10*3/uL (ref 0.0–0.7)
Eosinophils Relative: 0 % (ref 0–5)
Hemoglobin: 14.1 g/dL (ref 13.0–17.0)
MCH: 29.1 pg (ref 26.0–34.0)
MCV: 88 fL (ref 78.0–100.0)
Monocytes Absolute: 1.5 10*3/uL — ABNORMAL HIGH (ref 0.1–1.0)
Platelets: 213 10*3/uL (ref 150–400)
RDW: 14 % (ref 11.5–15.5)

## 2013-11-10 LAB — URINALYSIS, ROUTINE W REFLEX MICROSCOPIC
Bilirubin Urine: NEGATIVE
Glucose, UA: NEGATIVE mg/dL
Hgb urine dipstick: NEGATIVE
Ketones, ur: NEGATIVE mg/dL
Leukocytes, UA: NEGATIVE
Protein, ur: NEGATIVE mg/dL
Specific Gravity, Urine: >1.03 — ABNORMAL HIGH (ref 1.005–1.030)

## 2013-11-10 LAB — BASIC METABOLIC PANEL
BUN: 11 mg/dL (ref 6–23)
CO2: 28 mEq/L (ref 19–32)
Calcium: 9.4 mg/dL (ref 8.4–10.5)
Creatinine, Ser: 1.24 mg/dL (ref 0.50–1.35)
GFR calc non Af Amer: 69 mL/min — ABNORMAL LOW (ref >90)
Glucose, Bld: 140 mg/dL — ABNORMAL HIGH (ref 70–99)
Sodium: 135 mEq/L (ref 135–145)

## 2013-11-10 LAB — PROTEIN, CSF: Total  Protein, CSF: 169 mg/dL — ABNORMAL HIGH (ref 15–45)

## 2013-11-10 MED ORDER — LAMOTRIGINE 200 MG PO TABS
200.0000 mg | ORAL_TABLET | Freq: Every day | ORAL | Status: DC
  Administered 2013-11-10 – 2013-11-14 (×5): 200 mg via ORAL
  Filled 2013-11-10 (×4): qty 1
  Filled 2013-11-10: qty 2
  Filled 2013-11-10: qty 1
  Filled 2013-11-10 (×2): qty 2
  Filled 2013-11-10: qty 1

## 2013-11-10 MED ORDER — HYDROMORPHONE HCL PF 1 MG/ML IJ SOLN
1.0000 mg | Freq: Once | INTRAMUSCULAR | Status: AC
  Administered 2013-11-10: 1 mg via INTRAVENOUS
  Filled 2013-11-10: qty 1

## 2013-11-10 MED ORDER — SODIUM CHLORIDE 0.9 % IV BOLUS (SEPSIS)
1000.0000 mL | Freq: Once | INTRAVENOUS | Status: AC
  Administered 2013-11-10: 1000 mL via INTRAVENOUS

## 2013-11-10 MED ORDER — DEXTROSE 5 % IV SOLN
2.0000 g | Freq: Two times a day (BID) | INTRAVENOUS | Status: DC
  Administered 2013-11-11 – 2013-11-13 (×5): 2 g via INTRAVENOUS
  Filled 2013-11-10 (×7): qty 2

## 2013-11-10 MED ORDER — VANCOMYCIN HCL IN DEXTROSE 1-5 GM/200ML-% IV SOLN
1000.0000 mg | Freq: Three times a day (TID) | INTRAVENOUS | Status: DC
  Administered 2013-11-11 (×3): 1000 mg via INTRAVENOUS
  Filled 2013-11-10 (×5): qty 200

## 2013-11-10 MED ORDER — DEXTROSE 5 % IV SOLN: 2.0000 g | INTRAVENOUS | Status: DC

## 2013-11-10 MED ORDER — VANCOMYCIN HCL IN DEXTROSE 1-5 GM/200ML-% IV SOLN
1000.0000 mg | Freq: Once | INTRAVENOUS | Status: AC
  Administered 2013-11-10: 1000 mg via INTRAVENOUS
  Filled 2013-11-10: qty 200

## 2013-11-10 MED ORDER — TIZANIDINE HCL 4 MG PO TABS
8.0000 mg | ORAL_TABLET | Freq: Three times a day (TID) | ORAL | Status: DC
  Administered 2013-11-10 – 2013-11-14 (×11): 8 mg via ORAL
  Filled 2013-11-10 (×20): qty 2

## 2013-11-10 MED ORDER — ONDANSETRON HCL 4 MG/2ML IJ SOLN
4.0000 mg | Freq: Once | INTRAMUSCULAR | Status: AC
  Administered 2013-11-10: 4 mg via INTRAMUSCULAR
  Filled 2013-11-10: qty 2

## 2013-11-10 MED ORDER — DEXTROSE 5 % IV SOLN
1000.0000 mg | Freq: Three times a day (TID) | INTRAVENOUS | Status: DC
  Filled 2013-11-10 (×3): qty 20

## 2013-11-10 MED ORDER — DIPHENHYDRAMINE HCL 50 MG/ML IJ SOLN
25.0000 mg | Freq: Once | INTRAMUSCULAR | Status: AC
  Administered 2013-11-10: 25 mg via INTRAVENOUS
  Filled 2013-11-10: qty 1

## 2013-11-10 MED ORDER — DOXEPIN HCL 25 MG PO CAPS
75.0000 mg | ORAL_CAPSULE | Freq: Every day | ORAL | Status: DC
  Administered 2013-11-10 – 2013-11-14 (×5): 75 mg via ORAL
  Filled 2013-11-10 (×5): qty 3

## 2013-11-10 MED ORDER — VANCOMYCIN HCL IN DEXTROSE 1-5 GM/200ML-% IV SOLN
INTRAVENOUS | Status: AC
  Filled 2013-11-10: qty 400

## 2013-11-10 MED ORDER — MORPHINE SULFATE ER 30 MG PO TBCR
60.0000 mg | EXTENDED_RELEASE_TABLET | Freq: Three times a day (TID) | ORAL | Status: DC
  Administered 2013-11-10 – 2013-11-14 (×11): 60 mg via ORAL
  Filled 2013-11-10 (×11): qty 2

## 2013-11-10 MED ORDER — PANTOPRAZOLE SODIUM 40 MG PO TBEC
40.0000 mg | DELAYED_RELEASE_TABLET | Freq: Every day | ORAL | Status: DC
  Administered 2013-11-10 – 2013-11-13 (×4): 40 mg via ORAL
  Filled 2013-11-10 (×4): qty 1

## 2013-11-10 MED ORDER — ACYCLOVIR SODIUM 50 MG/ML IV SOLN
INTRAVENOUS | Status: AC
  Filled 2013-11-10: qty 20

## 2013-11-10 MED ORDER — METHYLPREDNISOLONE SODIUM SUCC 125 MG IJ SOLR
125.0000 mg | Freq: Once | INTRAMUSCULAR | Status: AC
  Administered 2013-11-10: 125 mg via INTRAVENOUS
  Filled 2013-11-10: qty 2

## 2013-11-10 MED ORDER — DEXTROSE 5 % IV SOLN
10.0000 mg/kg | Freq: Three times a day (TID) | INTRAVENOUS | Status: DC
  Administered 2013-11-10 – 2013-11-14 (×11): 775 mg via INTRAVENOUS
  Filled 2013-11-10 (×21): qty 15.5

## 2013-11-10 MED ORDER — DEXAMETHASONE SODIUM PHOSPHATE 10 MG/ML IJ SOLN
10.0000 mg | Freq: Four times a day (QID) | INTRAMUSCULAR | Status: DC
  Administered 2013-11-11 – 2013-11-13 (×11): 10 mg via INTRAVENOUS
  Filled 2013-11-10 (×15): qty 1

## 2013-11-10 MED ORDER — LEVOTHYROXINE SODIUM 25 MCG PO TABS
125.0000 ug | ORAL_TABLET | Freq: Every day | ORAL | Status: DC
  Administered 2013-11-11 – 2013-11-14 (×4): 125 ug via ORAL
  Filled 2013-11-10 (×8): qty 1

## 2013-11-10 MED ORDER — DEXAMETHASONE SODIUM PHOSPHATE 10 MG/ML IJ SOLN
INTRAMUSCULAR | Status: AC
  Filled 2013-11-10: qty 2

## 2013-11-10 MED ORDER — BUPROPION HCL ER (XL) 150 MG PO TB24
450.0000 mg | ORAL_TABLET | Freq: Every day | ORAL | Status: DC
  Administered 2013-11-10 – 2013-11-14 (×5): 450 mg via ORAL
  Filled 2013-11-10 (×8): qty 3

## 2013-11-10 MED ORDER — ONDANSETRON HCL 4 MG PO TABS: 4.0000 mg | ORAL_TABLET | Freq: Four times a day (QID) | ORAL | Status: DC | PRN

## 2013-11-10 MED ORDER — BUPROPION HCL ER (XL) 150 MG PO TB24
ORAL_TABLET | ORAL | Status: AC
  Filled 2013-11-10: qty 3

## 2013-11-10 MED ORDER — DEXTROSE 5 % IV SOLN
INTRAVENOUS | Status: AC
  Filled 2013-11-10: qty 2

## 2013-11-10 MED ORDER — KETOROLAC TROMETHAMINE 30 MG/ML IJ SOLN
30.0000 mg | Freq: Once | INTRAMUSCULAR | Status: AC
  Administered 2013-11-10: 30 mg via INTRAVENOUS
  Filled 2013-11-10: qty 1

## 2013-11-10 MED ORDER — CLONAZEPAM 0.5 MG PO TABS
1.0000 mg | ORAL_TABLET | Freq: Two times a day (BID) | ORAL | Status: DC | PRN
  Administered 2013-11-13 – 2013-11-14 (×2): 1 mg via ORAL
  Filled 2013-11-10 (×2): qty 2

## 2013-11-10 MED ORDER — SODIUM CHLORIDE 0.9 % IV SOLN
INTRAVENOUS | Status: AC
  Administered 2013-11-10: 23:00:00 via INTRAVENOUS

## 2013-11-10 MED ORDER — TIZANIDINE HCL 4 MG PO TABS
ORAL_TABLET | ORAL | Status: AC
  Filled 2013-11-10: qty 2

## 2013-11-10 MED ORDER — METOCLOPRAMIDE HCL 5 MG/ML IJ SOLN
10.0000 mg | Freq: Once | INTRAMUSCULAR | Status: AC
  Administered 2013-11-10: 10 mg via INTRAVENOUS
  Filled 2013-11-10: qty 2

## 2013-11-10 MED ORDER — ONDANSETRON HCL 4 MG/2ML IJ SOLN: 4.0000 mg | Freq: Four times a day (QID) | INTRAMUSCULAR | Status: DC | PRN

## 2013-11-10 MED ORDER — HYDROMORPHONE HCL PF 1 MG/ML IJ SOLN
1.0000 mg | INTRAMUSCULAR | Status: DC | PRN
  Administered 2013-11-10 – 2013-11-13 (×6): 1 mg via INTRAVENOUS
  Filled 2013-11-10 (×6): qty 1

## 2013-11-10 MED ORDER — DEXTROSE 5 % IV SOLN
2.0000 g | Freq: Once | INTRAVENOUS | Status: AC
  Administered 2013-11-10: 2 g via INTRAVENOUS
  Filled 2013-11-10: qty 2

## 2013-11-10 NOTE — H&P (Signed)
PCP:   Leo Grosser, MD   Chief Complaint:  headache  HPI: 47 yo male comes in with headache worsening since Saturday (2 days ago).  With some n/v.  No diarrhea.  With photophobia.  No h/o migraines in past.  With subj fevers and chills for past 24 hours.  No dysuria.  No rashes.  No cough.  Reports having a "scratch" on his penis for which he saw his pcp last week for.  Was placed on bactrim, at that time were no vesicles, but pcp thought it may be herpes.  He started the bactrim, and then several days later vesicles formed and popped.  He still has ulcer on shaft of penis.  No recent procedures on his spine or injections, has chronic lower back pain.  No sick contacts.  Review of Systems:  Positive and negative as per HPI otherwise all other systems are negative  Past Medical History: Past Medical History  Diagnosis Date  . Acid reflux   . Anxiety   . Depression   . Hypothyroidism   . Hypertension   . Hyperlipidemia   . Testosterone deficiency   . Spondylosis   . Genital warts   . Smoker    Past Surgical History  Procedure Laterality Date  . Knee surgery      right  . Wrist surgery      right and left  . Cholecystectomy  2007  . Nasal septum surgery    . Nasal sinus surgery      Medications: Prior to Admission medications   Medication Sig Start Date End Date Taking? Authorizing Provider  buPROPion (WELLBUTRIN XL) 150 MG 24 hr tablet Take 450 mg by mouth daily.   Yes Historical Provider, MD  clonazePAM (KLONOPIN) 1 MG tablet Take 1 mg by mouth 2 (two) times daily as needed for anxiety.   Yes Historical Provider, MD  doxepin (SINEQUAN) 75 MG capsule Take 75 mg by mouth daily.   Yes Historical Provider, MD  furosemide (LASIX) 40 MG tablet Take 1 tablet (40 mg total) by mouth daily. 10/30/13  Yes Donita Brooks, MD  lamoTRIgine (LAMICTAL) 200 MG tablet Take 200 mg by mouth daily.   Yes Historical Provider, MD  levothyroxine (SYNTHROID, LEVOTHROID) 125 MCG tablet  Take 125 mcg by mouth daily before breakfast.   Yes Historical Provider, MD  morphine (MS CONTIN) 60 MG 12 hr tablet Take 60 mg by mouth 3 (three) times daily.   Yes Historical Provider, MD  pantoprazole (PROTONIX) 40 MG tablet Take 40 mg by mouth at bedtime.   Yes Historical Provider, MD  potassium chloride SA (K-DUR,KLOR-CON) 20 MEQ tablet Take 1 tablet (20 mEq total) by mouth daily. 10/29/13  Yes Donita Brooks, MD  pravastatin (PRAVACHOL) 20 MG tablet Take 1 tablet (20 mg total) by mouth daily. 03/19/13  Yes Mary B Dixon, PA-C  sulfamethoxazole-trimethoprim (BACTRIM DS) 800-160 MG per tablet Take 1 tablet by mouth 2 (two) times daily. 11/06/13  Yes Donita Brooks, MD  testosterone cypionate (DEPOTESTOTERONE CYPIONATE) 200 MG/ML injection Inject 200 mg into the muscle every 14 (fourteen) days.   Yes Historical Provider, MD  tiZANidine (ZANAFLEX) 4 MG tablet Take 2 tablets by mouth 3 (three) times daily. 05/21/13  Yes Historical Provider, MD    Allergies:   Allergies  Allergen Reactions  . Testosterone     Edema from topical applications    Social History:  reports that he has been smoking Cigarettes.  He has been smoking  about 1.00 pack per day. He does not have any smokeless tobacco history on file. He reports that he does not drink alcohol or use illicit drugs.  Family History: Family History  Problem Relation Age of Onset  . Cancer Maternal Grandfather     stomach    Physical Exam: Filed Vitals:   11/10/13 1146 11/10/13 1436 11/10/13 1755 11/10/13 2013  BP: 142/78 136/74 136/72 124/66  Pulse: 91 98 107 110  Temp: 98.7 F (37.1 C)   99.4 F (37.4 C)  TempSrc: Oral   Oral  Resp: 20 20 20 20   Height:      Weight:    109.6 kg (241 lb 10 oz)  SpO2: 92% 99% 90% 92%   General appearance: alert, cooperative and no distress Head: Normocephalic, without obvious abnormality, atraumatic Eyes: negative Nose: Nares normal. Septum midline. Mucosa normal. No drainage or sinus  tenderness. Neck: no JVD and supple, symmetrical, trachea midline Lungs: clear to auscultation bilaterally Heart: regular rate and rhythm, S1, S2 normal, no murmur, click, rub or gallop Abdomen: soft, non-tender; bowel sounds normal; no masses,  no organomegaly Male genitalia: abnormal findings: ulcerative lesion on shaft of penis no vesicles no surrounding ertyema no penile discharge or drainage around lesion Extremities: extremities normal, atraumatic, no cyanosis or edema Pulses: 2+ and symmetric Skin: Skin color, texture, turgor normal. No rashes or lesions Neurologic: Grossly normal    Labs on Admission:   Recent Labs  11/10/13 1051  NA 135  K 4.2  CL 98  CO2 28  GLUCOSE 140*  BUN 11  CREATININE 1.24  CALCIUM 9.4    Recent Labs  11/10/13 1051  WBC 14.1*  NEUTROABS 11.3*  HGB 14.1  HCT 42.7  MCV 88.0  PLT 213    Radiological Exams on Admission: Ct Head Wo Contrast  11/10/2013   CLINICAL DATA:  Headache with fever, nausea, vomiting, and photophobia  EXAM: CT HEAD WITHOUT CONTRAST  TECHNIQUE: Contiguous axial images were obtained from the base of the skull through the vertex without intravenous contrast. Study was performed within 24 hr of patient's arrival at the emergency department.  COMPARISON:  Brain MRI September 05, 2005  FINDINGS: Ventricles are normal in size and configuration. The right lateral ventricle is slightly larger than the left lateral ventricle, an anatomic variant. There is no mass, hemorrhage, extra-axial fluid collection, or midline shift. Gray-white compartments are normal. No demonstrable acute infarct. Bony calvarium appears intact. The mastoid air cells are clear.  IMPRESSION: Study within normal limits.   Electronically Signed   By: Bretta Bang M.D.   On: 11/10/2013 17:07    Assessment/Plan  47 yo male with acute meningitis  Principal Problem:   Meningitis-  Already received rocephin and vancomycin.  Penile lesion concerning for  herpes, will add acyclovir to regimen.  Herpes, enterovirus and coxsackie pcr added to csf fluid.  Called ID on call dr hatcher also advised to add dexamethosone 10mg  q 6 (verified with pharmacy).  Also added hiv screen.  Droplet precautions ordered.  Active Problems:   Depression   Hypothyroidism   Hypertension   Hyperlipidemia   Penile lesion    Minh,Hurman Ketelsen A 11/10/2013, 9:00 PM

## 2013-11-10 NOTE — ED Notes (Signed)
Pt still can not give a urine speciman

## 2013-11-10 NOTE — Progress Notes (Signed)
ANTIBIOTIC CONSULT NOTE - INITIAL  Pharmacy Consult for Vancomycin, Ceftriaxone, Acyclovir Indication: Meningitis  Allergies  Allergen Reactions  . Testosterone     Edema from topical applications    Patient Measurements: Height: 6' (182.9 cm) Weight: 241 lb 10 oz (109.6 kg) IBW/kg (Calculated) : 77.6 Adjusted Body Weight:   Vital Signs: Temp: 99.4 F (37.4 C) (12/15 2013) Temp src: Oral (12/15 2013) BP: 124/66 mmHg (12/15 2013) Pulse Rate: 110 (12/15 2013) Intake/Output from previous day:   Intake/Output from this shift:    Labs:  Recent Labs  11/10/13 1051  WBC 14.1*  HGB 14.1  PLT 213  CREATININE 1.24   Estimated Creatinine Clearance: 96.2 ml/min (by C-G formula based on Cr of 1.24). No results found for this basename: VANCOTROUGH, Leodis Binet, VANCORANDOM, GENTTROUGH, GENTPEAK, GENTRANDOM, TOBRATROUGH, TOBRAPEAK, TOBRARND, AMIKACINPEAK, AMIKACINTROU, AMIKACIN,  in the last 72 hours   Microbiology: Recent Results (from the past 720 hour(s))  CSF CULTURE     Status: None   Collection Time    11/10/13  2:55 PM      Result Value Range Status   Specimen Description CSF   Final   Special Requests NONE   Final   Gram Stain     Final   Value: CYTOSPIN SMEAR = WBC PRESENT, PREDOMINANTLY MONONUCLEAR NO ORGANISMS SEEN     Performed at [facility]   Culture PENDING   Incomplete   Report Status PENDING   Incomplete    Medical History: Past Medical History  Diagnosis Date  . Acid reflux   . Anxiety   . Depression   . Hypothyroidism   . Hypertension   . Hyperlipidemia   . Testosterone deficiency   . Spondylosis   . Genital warts   . Smoker     Medications:  Scheduled:  . acyclovir  10 mg/kg (Ideal) Intravenous Q8H  . buPROPion  450 mg Oral Daily  . [START ON 11/11/2013] cefTRIAXone (ROCEPHIN)  IV  2 g Intravenous Q12H  . dexamethasone  10 mg Intravenous Q6H  . doxepin  75 mg Oral Daily  . lamoTRIgine  200 mg Oral Daily  . [START ON 11/11/2013]  levothyroxine  125 mcg Oral QAC breakfast  . morphine  60 mg Oral Q8H  . pantoprazole  40 mg Oral QHS  . tiZANidine  8 mg Oral TID  . vancomycin  1,000 mg Intravenous Once  . [START ON 11/11/2013] vancomycin  1,000 mg Intravenous Q8H   Assessment: Acute meningitis Rocephin 2 GM IV and Vancomycin 1 GM IV given tonight IBW 77.6 kg   Goal of Therapy:  Vancomycin trough level 15-20 mcg/ml  Plan:  Ceftriaxone 2 GM IV every 12 hours Vancomycin 1 GM IV additional for total loading dose Vancomycin 2 GM, then Vancomycin 1 GM IV every 8 hours Acyclovir 775 mg every 8 hours (10 mg/kg based on IBW) Vancomycin trough at steady state Monitor renal function Labs per protocol  Raquel James, Bashar Milam Bennett 11/10/2013,9:47 PM

## 2013-11-10 NOTE — ED Notes (Addendum)
Lab called critical WBC Tube One 272, Tube 4 379.  Notified Edp

## 2013-11-10 NOTE — ED Notes (Addendum)
Body aches, vomiting and headache x 2 days.  Denies cough/abd pain.  Unknown fever. Pt also c/o neck pain and neck stiffness.

## 2013-11-10 NOTE — ED Notes (Signed)
Pt is aware a urine sample is needed. 

## 2013-11-10 NOTE — ED Provider Notes (Addendum)
CSN: [id number]     Arrival date & time 11/10/13  0929 History   This chart was scribed for Donnetta Hutching, MD, by Yevette Edwards, ED Scribe. This patient was seen in room APA03/APA03 and the patient's care was started at 10:36 AM.  First MD Initiated Contact with Patient 11/10/13 1022     Chief Complaint  Patient presents with  . Headache  . Emesis  . Generalized Body Aches   The history is provided by the patient. No language interpreter was used.    HPI Comments: Tony Campos is a 47 y.o. male, with a h/o HTN and anxiety, who presents to the Emergency Department complaining of a right occipital and bilateral frontal headache which began two days ago and is associated with emesis, mild photophobia, and a low-grade fever.  In the ED, he has a temperature of 99.6 F. The pt has treated his symptoms with IBU with minimal relief. He denies a h/o chronic headaches. He also denies sick contacts. The pt is ambulatory.  He is a current smoker.   Past Medical History  Diagnosis Date  . Acid reflux   . Anxiety   . Depression   . Hypothyroidism   . Hypertension   . Hyperlipidemia   . Testosterone deficiency   . Spondylosis   . Genital warts   . Smoker    Past Surgical History  Procedure Laterality Date  . Knee surgery      right  . Wrist surgery      right and left  . Cholecystectomy  2007  . Nasal septum surgery    . Nasal sinus surgery     Family History  Problem Relation Age of Onset  . Cancer Maternal Grandfather     stomach   History  Substance Use Topics  . Smoking status: Current Every Day Smoker -- 1.00 packs/day    Types: Cigarettes  . Smokeless tobacco: Not on file  . Alcohol Use: No    Review of Systems  Constitutional: Positive for fever (99.6 F).  Eyes: Positive for photophobia (Mild).  Gastrointestinal: Positive for vomiting.  Neurological: Positive for headaches.  All other systems reviewed and are negative.   Allergies  Testosterone  Home  Medications   Current Outpatient Rx  Name  Route  Sig  Dispense  Refill  . amLODipine (NORVASC) 10 MG tablet   Oral   Take 1 tablet (10 mg total) by mouth daily.   90 tablet   3   . ARIPiprazole (ABILIFY) 15 MG tablet   Oral   Take 15 mg by mouth daily.         Marland Kitchen buPROPion (WELLBUTRIN XL) 300 MG 24 hr tablet   Oral   Take 300 mg by mouth daily.         . clonazePAM (KLONOPIN) 1 MG tablet   Oral   Take 1 mg by mouth 2 (two) times daily as needed for anxiety.         . fentaNYL (DURAGESIC - DOSED MCG/HR) 50 MCG/HR   Transdermal   Place 1 patch onto the skin every 3 (three) days.         . fluticasone (FLONASE) 50 MCG/ACT nasal spray   Nasal   Place 2 sprays into the nose daily.   16 g   6   . furosemide (LASIX) 40 MG tablet   Oral   Take 1 tablet (40 mg total) by mouth daily.   30 tablet   3   .  lamoTRIgine (LAMICTAL) 200 MG tablet   Oral   Take 200 mg by mouth daily.         Marland Kitchen levothyroxine (SYNTHROID, LEVOTHROID) 125 MCG tablet   Oral   Take 125 mcg by mouth daily before breakfast.         . morphine (MS CONTIN) 60 MG 12 hr tablet   Oral   Take 60 mg by mouth 3 (three) times daily.         Marland Kitchen morphine (MSIR) 15 MG tablet   Oral   Take 1 tablet by mouth 3 (three) times daily as needed.         . pantoprazole (PROTONIX) 40 MG tablet   Oral   Take 40 mg by mouth at bedtime.         . potassium chloride SA (K-DUR,KLOR-CON) 20 MEQ tablet   Oral   Take 1 tablet (20 mEq total) by mouth daily.   30 tablet   3   . pravastatin (PRAVACHOL) 20 MG tablet   Oral   Take 1 tablet (20 mg total) by mouth daily.   90 tablet   3   . sulfamethoxazole-trimethoprim (BACTRIM DS) 800-160 MG per tablet   Oral   Take 1 tablet by mouth 2 (two) times daily.   14 tablet   0   . testosterone cypionate (DEPOTESTOTERONE CYPIONATE) 200 MG/ML injection   Intramuscular   Inject 200 mg into the muscle every 14 (fourteen) days.         Marland Kitchen tiZANidine  (ZANAFLEX) 4 MG tablet   Oral   Take 2 tablets by mouth 3 (three) times daily.          Triage Vitals: BP 147/78  Pulse 99  Temp(Src) 99.6 F (37.6 C) (Oral)  Resp 16  Ht 6' (1.829 m)  Wt 242 lb (109.77 kg)  BMI 32.81 kg/m2  SpO2 97%  Physical Exam  Nursing note and vitals reviewed. Constitutional: He is oriented to person, place, and time. He appears well-developed and well-nourished.  HENT:  Head: Normocephalic and atraumatic.  Eyes: Conjunctivae and EOM are normal. Pupils are equal, round, and reactive to light.  Neck: Normal range of motion. Neck supple.  No tenderness posteriorly to back.   Cardiovascular: Normal rate, regular rhythm and normal heart sounds.   Pulmonary/Chest: Effort normal and breath sounds normal.  Abdominal: Soft. Bowel sounds are normal.  Musculoskeletal: Normal range of motion.  Neurological: He is alert and oriented to person, place, and time.  Skin: Skin is warm and dry.  Psychiatric: He has a normal mood and affect. His behavior is normal.    ED Course  LUMBAR PUNCTURE Date/Time: 11/10/2013 2:40 PM Performed by: Donnetta Hutching Authorized by: Donnetta Hutching Consent: Verbal consent obtained. written consent obtained. Risks and benefits: risks, benefits and alternatives were discussed Consent given by: patient Patient understanding: patient states understanding of the procedure being performed Patient consent: the patient's understanding of the procedure matches consent given Procedure consent: procedure consent matches procedure scheduled Relevant documents: relevant documents present and verified Test results: test results available and properly labeled Site marked: the operative site was marked Patient identity confirmed: verbally with patient Time out: Immediately prior to procedure a "time out" was called to verify the correct patient, procedure, equipment, support staff and site/side marked as required. Indications: evaluation for  infection Anesthesia: local infiltration Local anesthetic: lidocaine 1% without epinephrine Anesthetic total: 3 ml Patient sedated: yes Sedation: dilaudid. Analgesia: hydromorphone Preparation: Patient was prepped  and draped in the usual sterile fashion. Lumbar space: L4-L5 interspace Patient's position: sitting Needle gauge: 20 Needle type: spinal needle - Quincke tip Needle length: 3.5 in Number of attempts: 1 Fluid appearance: clear Tubes of fluid: 4 Total volume: 8 ml Post-procedure: site cleaned and adhesive bandage applied Patient tolerance: Patient tolerated the procedure well with no immediate complications.   (including critical care time)  DIAGNOSTIC STUDIES: Oxygen Saturation is 97% on room air, normal by my interpretation.    COORDINATION OF CARE:  10:41 AM- Discussed treatment plan with patient, which includes IV fluids, pain medication, and CBC, and the patient agreed to the plan.   Labs Review Labs Reviewed  BASIC METABOLIC PANEL - Abnormal; Notable for the following:    Glucose, Bld 140 (*)    GFR calc non Af Amer 69 (*)    GFR calc Af Amer 80 (*)    All other components within normal limits  CBC WITH DIFFERENTIAL - Abnormal; Notable for the following:    WBC 14.1 (*)    Neutrophils Relative % 80 (*)    Neutro Abs 11.3 (*)    Lymphocytes Relative 10 (*)    Monocytes Absolute 1.5 (*)    All other components within normal limits  URINALYSIS, ROUTINE W REFLEX MICROSCOPIC   Imaging Review No results found.  EKG Interpretation   None       MDM  No diagnosis found.        Unexplained frontal and occipital headache. No frank meningeal signs.   IV fluids, pain management. Lumbar puncture performed without complication. Spinal fluid clear.  1600:    Recheck. Patient still complaining of headache. Will order CT head. Discussed with Dr Estell Harpin.   I personally performed the services described in this documentation, which was scribed in my presence. The  recorded information has been reviewed and is accurate.     Donnetta Hutching, MD 11/10/13 9604  Donnetta Hutching, MD 11/10/13 [phone]

## 2013-11-11 DIAGNOSIS — F17200 Nicotine dependence, unspecified, uncomplicated: Secondary | ICD-10-CM

## 2013-11-11 LAB — BASIC METABOLIC PANEL
BUN: 10 mg/dL (ref 6–23)
Calcium: 8.6 mg/dL (ref 8.4–10.5)
Chloride: 105 mEq/L (ref 96–112)
Creatinine, Ser: 1.06 mg/dL (ref 0.50–1.35)
GFR calc Af Amer: >90 mL/min (ref >90)
Sodium: 137 mEq/L (ref 135–145)

## 2013-11-11 LAB — PATHOLOGIST SMEAR REVIEW

## 2013-11-11 LAB — CBC
HCT: 37.3 % — ABNORMAL LOW (ref 39.0–52.0)
MCHC: 33 g/dL (ref 30.0–36.0)
RDW: 14.2 % (ref 11.5–15.5)
WBC: 14.4 10*3/uL — ABNORMAL HIGH (ref 4.0–10.5)

## 2013-11-11 MED ORDER — PNEUMOCOCCAL VAC POLYVALENT 25 MCG/0.5ML IJ INJ
0.5000 mL | INJECTION | INTRAMUSCULAR | Status: AC
  Administered 2013-11-12: 0.5 mL via INTRAMUSCULAR
  Filled 2013-11-11: qty 0.5

## 2013-11-11 NOTE — Progress Notes (Signed)
[facility] PROGRESS NOTE  Tony Campos ZOX:[id number] DOB: Oct 06, 1966 DOA: 11/10/2013 PCP: Leo Grosser, MD  Assessment/Plan: 1. Acute meningitis, possibly bacterial; no focal deficits, no neurologic changes 2. Possible genital herpes 12/11, possible cellulitis of penis treated with Bactrim as an outpatient; no evidence of cellulitis at this point, no vesicles. Consider syphilis. 3. Hypertension, hyperlipidemia 4. Lower extremity edema, minimal, chronic; Grade 1 diastolic dysfunction by echocardiogram 10/2013, appears clinically compensated 5. Cigarette smoker, recommend cessation 6. GERD 7. Depression  8. Borderline hypoxia, likely secondary to chronic smoking; no signs or symptoms of pulmonary etiology. Follow clinically.   Continue empiric antibiotic therapy pending culture results, dexamethasone per infectious disease  check RPR  CBC will differential in a.m.  Pending studies:   HIV  HSV, Enterovirus pcr CSF  Echovirus antibodies CSF  Code Status: full code DVT prophylaxis: SCDs Family Communication: none Disposition Plan: home when improved  Brendia Sacks, MD  [facility]  Pager [phone] If 7PM-7AM, please contact night-coverage at [email or web address], password [facility] 11/11/2013, 9:30 AM  LOS: 1 day   Summary: 47 year old man presented with history of headache for 2 days, nausea, vomiting, photophobia, subjective fever and chills. No rash. Recently seen by PCP for possible cellulitis and genital herpes infection. Initial evaluation included lumbar puncture was suggested meningitis and the patient was referred for admission. Because of ulcer on penis HSV was considered and acyclovir added to his regimen per infectious disease.  Consultants:    Procedures:  12/15 lumbar puncture in the emergency department  Antibiotics:  Acyclovir 12/50 >>   Ceftriaxone 12/15 >>   Vancomycin 12/15 >>   Dexamethasone 12/15 >>   HPI/Subjective: Headache  about 5% better. Neck is still stiff. No nausea or vomiting.  Objective: Filed Vitals:   11/10/13 1436 11/10/13 1755 11/10/13 2013 11/11/13 0606  BP: 136/74 136/72 124/66 108/63  Pulse: 98 107 110 100  Temp:   99.4 F (37.4 C) 98.9 F (37.2 C)  TempSrc:   Oral Oral  Resp: 20 20 20 20   Height:      Weight:   109.6 kg (241 lb 10 oz)   SpO2: 99% 90% 92% 89%   No intake or output data in the 24 hours ending 11/11/13 0930   Filed Weights   11/10/13 0934 11/10/13 2013  Weight: 109.77 kg (242 lb) 109.6 kg (241 lb 10 oz)    Exam:   Afebrile, vital signs stable, hypoxia  General: Appears calm and comfortable. Well-appearing.  Cardiovascular: Regular rate and rhythm. No murmur, rub or gallop. 1+ bilateral lower extremity edema.  Respiratory: Clear to auscultation bilaterally. No wheezes, rales or rhonchi. Normal respiratory effort.  Abdomen: Soft  Skin: No rash noted  GU: Shallow ulcer seen on the shaft of the penis without evidence of cellulitis or vesicles  Neurologic: Cranial nerves appear intact, speech fluent and clear, no focal neuro deficits  Data Reviewed:  Basic metabolic panel unremarkable  Leukocytosis without significant change from 14.4   Urinalysis negative   CT head and chest x-ray unremarkable  Scheduled Meds: . acyclovir  10 mg/kg (Ideal) Intravenous Q8H  . buPROPion  450 mg Oral Daily  . cefTRIAXone (ROCEPHIN)  IV  2 g Intravenous Q12H  . dexamethasone  10 mg Intravenous Q6H  . doxepin  75 mg Oral Daily  . lamoTRIgine  200 mg Oral Daily  . levothyroxine  125 mcg Oral QAC breakfast  . morphine  60 mg Oral Q8H  . pantoprazole  40 mg Oral QHS  . [  START ON 11/12/2013] pneumococcal 23 valent vaccine  0.5 mL Intramuscular Tomorrow-1000  . tiZANidine  8 mg Oral TID  . vancomycin  1,000 mg Intravenous Q8H   Continuous Infusions:   Principal Problem:   Meningitis Active Problems:   Depression   Hypothyroidism   Hypertension   Hyperlipidemia    Penile lesion   Time spent 40 minutes greater than 50% in counseling and coordination of care

## 2013-11-11 NOTE — ED Provider Notes (Signed)
CRITICAL CARE     From 11/10/13 Performed by: Donnetta Hutching Total critical care time: 30 Critical care time was exclusive of separately billable procedures and treating other patients. Critical care was necessary to treat or prevent imminent or life-threatening deterioration. Critical care was time spent personally by me on the following activities: development of treatment plan with patient and/or surrogate as well as nursing, discussions with consultants, evaluation of patient's response to treatment, examination of patient, obtaining history from patient or surrogate, ordering and performing treatments and interventions, ordering and review of laboratory studies, ordering and review of radiographic studies, pulse oximetry and re-evaluation of patient's condition.  Donnetta Hutching, MD 11/11/13 [phone]

## 2013-11-11 NOTE — Care Management Note (Signed)
    Page 1 of 1   11/14/2013     2:15:20 PM   CARE MANAGEMENT NOTE 11/14/2013  Patient:  Tony Campos, Tony Campos   Account Number:  [id number]  Date Initiated:  11/11/2013  Documentation initiated by:  Rosemary Holms  Subjective/Objective Assessment:   Pt admitted with possible meningitis. In isolation. From home with spouse and will return home.     Action/Plan:   Anticipated DC Date:     Anticipated DC Plan:  HOME/SELF CARE      DC Planning Services  CM consult      Choice offered to / List presented to:             Status of service:  Completed, signed off Medicare Important Message given?   (If response is "NO", the following Medicare IM given date fields will be blank) Date Medicare IM given:   Date Additional Medicare IM given:    Discharge Disposition:  HOME/SELF CARE  Per UR Regulation:    If discussed at Long Length of Stay Meetings, dates discussed:    Comments:  11/14/13 1415 Arlyss Queen, RN BSN CM Pt discharged home today. No CM needs noted.  11/11/13 Rosemary Holms RN BSN CM

## 2013-11-11 NOTE — Progress Notes (Signed)
Utilization Review Complete  

## 2013-11-12 LAB — CBC
HCT: 39.1 % (ref 39.0–52.0)
MCH: 29.3 pg (ref 26.0–34.0)
MCV: 88.1 fL (ref 78.0–100.0)
Platelets: 233 10*3/uL (ref 150–400)
RBC: 4.44 MIL/uL (ref 4.22–5.81)
RDW: 13.8 % (ref 11.5–15.5)

## 2013-11-12 LAB — VANCOMYCIN, TROUGH: Vancomycin Tr: 11.9 ug/mL (ref 10.0–20.0)

## 2013-11-12 LAB — HERPES SIMPLEX VIRUS(HSV) DNA BY PCR
HSV 1 DNA: NOT DETECTED
HSV 2 DNA: DETECTED

## 2013-11-12 LAB — BASIC METABOLIC PANEL
BUN: 13 mg/dL (ref 6–23)
CO2: 26 mEq/L (ref 19–32)
Calcium: 8.9 mg/dL (ref 8.4–10.5)
Chloride: 101 mEq/L (ref 96–112)
Creatinine, Ser: 1.09 mg/dL (ref 0.50–1.35)
Glucose, Bld: 130 mg/dL — ABNORMAL HIGH (ref 70–99)
Sodium: 136 mEq/L (ref 135–145)

## 2013-11-12 LAB — ENTEROVIRUS PCR

## 2013-11-12 LAB — RPR: RPR Ser Ql: NONREACTIVE

## 2013-11-12 MED ORDER — VANCOMYCIN HCL 10 G IV SOLR
1250.0000 mg | Freq: Three times a day (TID) | INTRAVENOUS | Status: DC
  Administered 2013-11-12 – 2013-11-13 (×4): 1250 mg via INTRAVENOUS
  Filled 2013-11-12 (×7): qty 1250

## 2013-11-12 NOTE — Progress Notes (Signed)
ANTIBIOTIC CONSULT NOTE - INITIAL  Pharmacy Consult for Vancomycin, Ceftriaxone, Acyclovir Indication: Meningitis  Allergies  Allergen Reactions  . Testosterone     Edema from topical applications   Patient Measurements: Height: 6' (182.9 cm) Weight: 241 lb 10 oz (109.6 kg) IBW/kg (Calculated) : 77.6  Vital Signs: Temp: 97.8 F (36.6 C) (12/17 0523) Temp src: Oral (12/17 0523) BP: 124/74 mmHg (12/17 0523) Pulse Rate: 74 (12/17 0523) Intake/Output from previous day:   Intake/Output from this shift:    Labs:  Recent Labs  11/10/13 1051 11/11/13 0540 11/12/13 0658  WBC 14.1* 14.4* 16.1*  HGB 14.1 12.3* 13.0  PLT 213 206 233  CREATININE 1.24 1.06 1.09   Estimated Creatinine Clearance: 109.4 ml/min (by C-G formula based on Cr of 1.09).  Recent Labs  11/12/13 0700  VANCOTROUGH 11.9    Microbiology: Recent Results (from the past 720 hour(s))  CSF CULTURE     Status: None   Collection Time    11/10/13  2:55 PM      Result Value Range Status   Specimen Description CSF   Final   Special Requests NONE   Final   Gram Stain     Final   Value: CYTOSPIN WBC PRESENT, PREDOMINANTLY MONONUCLEAR     NO ORGANISMS SEEN     Performed at [facility]     Performed at [facility]   Culture     Final   Value: NO GROWTH     Performed at [employer] [facility]   Report Status PENDING   Incomplete   Medical History: Past Medical History  Diagnosis Date  . Acid reflux   . Anxiety   . Depression   . Hypothyroidism   . Hypertension   . Hyperlipidemia   . Testosterone deficiency   . Spondylosis   . Genital warts   . Smoker    Medications:  Scheduled:  . acyclovir  10 mg/kg (Ideal) Intravenous Q8H  . buPROPion  450 mg Oral Daily  . cefTRIAXone (ROCEPHIN)  IV  2 g Intravenous Q12H  . dexamethasone  10 mg Intravenous Q6H  . doxepin  75 mg Oral Daily  . lamoTRIgine  200 mg Oral Daily  . levothyroxine  125 mcg Oral QAC breakfast  . morphine  60 mg Oral  Q8H  . pantoprazole  40 mg Oral QHS  . pneumococcal 23 valent vaccine  0.5 mL Intramuscular Tomorrow-1000  . tiZANidine  8 mg Oral TID  . vancomycin  1,250 mg Intravenous Q8H   Assessment: 47yo male admitted with suspected meningitis and possible genital herpes.  Pt also has cellulitis of penis.  Trough level is below target.  All Vancomycin doses prior to level being drawn were charted as given.  Estimated Creatinine Clearance: 109.4 ml/min (by C-G formula based on Cr of 1.09).  Pt is obese.  Goal of Therapy:  Vancomycin trough level 15-20 mcg/ml  Plan:  Continue Ceftriaxone 2 GM IV every 12 hours Increase Vancomycin to 1250mg  IV every 8 hours Continue Acyclovir 775 mg every 8 hours (10 mg/kg based on IBW) Vancomycin trough level weekly while on Vancomycin De-escalate ABX when appropriate and feasible Monitor renal function, cultures and progress  Margo Aye, Omari Koslosky A 11/12/2013,9:00 AM

## 2013-11-12 NOTE — Progress Notes (Signed)
Unable to hang 6 am vancomycin due to late vanc trough.  Passed to oncoming nurse.

## 2013-11-12 NOTE — Progress Notes (Addendum)
[facility] PROGRESS NOTE  HOA DERISO ZOX:[id number] DOB: 1966/06/30 DOA: 11/10/2013 PCP: Leo Grosser, MD  Assessment/Plan: 1. Acute meningitis, afebrile, no focal deficits, clinically improving. Leukocytosis persists, see discussion below 2. Leukocytosis: Patient reports he has been told he has had leukocytosis in the past. I seeborderline leukocytosis from April 2009 and April 2014 with a normal WBC at other times. Pathologist discussed case by telephone yesterday, expressed concern for the possibility of AML inversion 16 based on marked atypia of cells in CSF. Flow cytometry requested but lab does not have enough fluid left. 3. Possible genital herpes 12/11, possible cellulitis of penis treated with Bactrim as an outpatient; no evidence of cellulitis at this point, no vesicles. RPR negative. Significance of shallow ulcer unclear, may have been related to recent infection or scrape. 4. Hypertension, hyperlipidemia; stable. 5. Lower extremity edema, minimal, chronic; Grade 1 diastolic dysfunction by echocardiogram 10/2013, appears clinically compensated. 6. Cigarette smoker, recommend cessation 7. GERD, stable 8. Depression , stable 9. Borderline hypoxia, resolved, likely secondary to chronic smoking. No evidence of pulmonary issue at this point.   Continue empiric antibiotic therapy pending culture results, dexamethasone total 4 days per infectious disease. RPR, HIV negative. GC and Chlamydia pending.  I have recommended repeating lumbar puncture in the morning to further evaluate cell atypia for flow cytometry and consider cryptococcal antigen. I discussed the abnormal cells with him but did not mention leukemia as the pathologist thought this could simply represent marked atypia from viral infection. At this point given negative cultures thus far and patient's overall clinical appearance I suspect viral process.   Peripheral smear  Followup penile lesion to resolution is  not patient. I do not think this represents Bowen's disease at this point.  Pending studies:   HSV, Enterovirus pcr CSF  Echovirus antibodies CSF  Code Status: full code DVT prophylaxis: SCDs Family Communication: none Disposition Plan: home when improved  Brendia Sacks, MD  [facility]  Pager [phone] If 7PM-7AM, please contact night-coverage at [email or web address], password [facility] 11/12/2013, 10:42 AM  LOS: 2 days   Summary: 47 year old man presented with history of headache for 2 days, nausea, vomiting, photophobia, subjective fever and chills. No rash. Recently seen by PCP for possible cellulitis and genital herpes infection. Initial evaluation included lumbar puncture was suggested meningitis and the patient was referred for admission. Because of ulcer on penis HSV was considered and acyclovir added to his regimen per infectious disease.  Consultants:    Procedures:  12/15 lumbar puncture in the emergency department  Antibiotics:  Acyclovir 12/50 >>   Ceftriaxone 12/15 >>   Vancomycin 12/15 >>   Dexamethasone 12/15 >> 12/80  HPI/Subjective: He feels 40% better. Headache much improved. Neck mobility has improved, decreased stiffness. No other complaints.  Objective: Filed Vitals:   11/11/13 0606 11/11/13 1418 11/11/13 2149 11/12/13 0523  BP: 108/63 112/64 142/79 124/74  Pulse: 100 90 99 74  Temp: 98.9 F (37.2 C)  98.8 F (37.1 C) 97.8 F (36.6 C)  TempSrc: Oral  Oral Oral  Resp: 20 17 18    Height:      Weight:      SpO2: 89% 92% 96% 94%   No intake or output data in the 24 hours ending 11/12/13 1042   Filed Weights   11/10/13 0934 11/10/13 2013  Weight: 109.77 kg (242 lb) 109.6 kg (241 lb 10 oz)    Exam:   Afebrile, vital signs stable, hypoxia, no hypoxia  General: Appears calm and comfortable. Speech  fluent and clear. Well-appearing.  Neck: Improved range of movement near normal now  Cardiovascular: Regular rate and rhythm. No murmur,  rub or gallop. Trace lower extremity edema right greater than left  Respiratory: Clear to auscultation bilaterally. No wheezes, rales or rhonchi. Normal respiratory effort.  Skin: No rash or induration  GU: Small ulcer on shaft of penis unchanged, no cellulitis or edema  Psychiatric: Grossly normal mood and affect. Speech fluent and appropriate  Data Reviewed:  Basic metabolic panel unremarkable  Leukocytosis 14.4 >> 16.1  RPR nonreactive  HIV nonreactive  Scheduled Meds: . acyclovir  10 mg/kg (Ideal) Intravenous Q8H  . buPROPion  450 mg Oral Daily  . cefTRIAXone (ROCEPHIN)  IV  2 g Intravenous Q12H  . dexamethasone  10 mg Intravenous Q6H  . doxepin  75 mg Oral Daily  . lamoTRIgine  200 mg Oral Daily  . levothyroxine  125 mcg Oral QAC breakfast  . morphine  60 mg Oral Q8H  . pantoprazole  40 mg Oral QHS  . pneumococcal 23 valent vaccine  0.5 mL Intramuscular Tomorrow-1000  . tiZANidine  8 mg Oral TID  . vancomycin  1,250 mg Intravenous Q8H   Continuous Infusions:   Principal Problem:   Meningitis Active Problems:   Depression   Hypothyroidism   Hypertension   Hyperlipidemia   Penile lesion   Time spent 20 minutes

## 2013-11-13 DIAGNOSIS — D72829 Elevated white blood cell count, unspecified: Secondary | ICD-10-CM

## 2013-11-13 DIAGNOSIS — A6000 Herpesviral infection of urogenital system, unspecified: Secondary | ICD-10-CM

## 2013-11-13 DIAGNOSIS — B003 Herpesviral meningitis: Principal | ICD-10-CM

## 2013-11-13 LAB — CBC WITH DIFFERENTIAL/PLATELET
Basophils Absolute: 0 10*3/uL (ref 0.0–0.1)
Basophils Relative: 0 % (ref 0–1)
Eosinophils Absolute: 0 10*3/uL (ref 0.0–0.7)
Eosinophils Relative: 0 % (ref 0–5)
HCT: 39.2 % (ref 39.0–52.0)
Hemoglobin: 13.2 g/dL (ref 13.0–17.0)
MCH: 29.2 pg (ref 26.0–34.0)
MCHC: 33.7 g/dL (ref 30.0–36.0)
MCV: 86.7 fL (ref 78.0–100.0)
Monocytes Absolute: 0.7 10*3/uL (ref 0.1–1.0)
Monocytes Relative: 5 % (ref 3–12)
RDW: 13.8 % (ref 11.5–15.5)
WBC: 15.6 10*3/uL — ABNORMAL HIGH (ref 4.0–10.5)

## 2013-11-13 LAB — GC/CHLAMYDIA PROBE AMP: CT Probe RNA: NEGATIVE

## 2013-11-13 MED ORDER — POLYETHYLENE GLYCOL 3350 17 G PO PACK
17.0000 g | PACK | Freq: Every day | ORAL | Status: DC
  Administered 2013-11-13 – 2013-11-14 (×2): 17 g via ORAL
  Filled 2013-11-13 (×2): qty 1

## 2013-11-13 MED ORDER — SENNA 8.6 MG PO TABS
6.0000 | ORAL_TABLET | Freq: Every day | ORAL | Status: DC
  Administered 2013-11-13 – 2013-11-14 (×2): 51.6 mg via ORAL
  Filled 2013-11-13 (×2): qty 6

## 2013-11-13 NOTE — Progress Notes (Addendum)
[facility] PROGRESS NOTE  Tony Campos WUJ:[id number] DOB: [birth year] DOA: 11/10/2013 PCP: Leo Grosser, MD  Summary: 47 year old man presented with history of headache for 2 days, nausea, vomiting, photophobia, subjective fever and chills. No rash. Recently seen by PCP for possible cellulitis and genital herpes infection. Initial evaluation included lumbar puncture was suggested meningitis and the patient was referred for admission. Because of ulcer on penis HSV was considered and acyclovir added to his regimen per infectious disease. 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 Patient will likely be discharged 12/19 on outpatient antiviral medication.   Assessment/Plan: 1. HSV meningitis, clinically resolved, afebrile, no focal deficits. Leukocytosis persists, see discussion below 2. Leukocytosis: Patient reports he has been told he has had leukocytosis in the past. I see borderline leukocytosis from April 2009 and April 2014 with a normal WBC at other times. Pathologist discussed case by telephone yesterday, expressed concern for the possibility of AML inversion 16 based on marked atypia of cells in CSF. Flow cytometry requested but lab does not have enough fluid left. I discussed the case with the pathologist again today Dr. Berneice Heinrich and updated her on HSV. She states that it is quite common for HSV to cause significant cellular atypia and she recommends no further evaluation at this point. 3. Presumed genital herpes 12/11, possible cellulitis of penis treated with Bactrim as an outpatient; no evidence of cellulitis at this point, no vesicles. RPR negative. Significance of shallow ulcer unclear, may have been related to recent infection or scrape. RPR was negative. 4. Hypertension, hyperlipidemia; stable. 5. Lower extremity edema, minimal, chronic; Grade 1 diastolic dysfunction by echocardiogram 10/2013, appears clinically compensated. 6. Cigarette smoker, recommend  cessation 7. GERD, stable 8. Depression , stable   Discussed in detail with Dr. Ninetta Lights with infectious disease. HSV positive, can discontinue ceftriaxone vancomycin and Decadron  Continue acyclovir day, change to high-dose Valtrex 1 g by mouth 3 times a day tomorrow for 2 weeks  Anticipate discharge home tomorrow  I reviewed all testing with the patient, discussed etiology of meningitis and treatment. He has already discussed this with his wife and made her aware of herpes infection.  Recommend repeat CBC in 2 weeks and follow leukocytosis to resolution. If persists suggest outpatient hematology evaluation.  Pending studies:   Echovirus antibodies CSF  Code Status: full code DVT prophylaxis: SCDs Family Communication: none Disposition Plan: home when improved  Brendia Sacks, MD  [facility]  Pager [phone] If 7PM-7AM, please contact night-coverage at [email or web address], password [facility] 11/13/2013, 12:24 PM  LOS: 3 days   Consultants:    Procedures:  12/15 lumbar puncture in the emergency department  Antibiotics:  Acyclovir 12/15 >>   Ceftriaxone 12/15 >> 12/18  Vancomycin 12/15 >> 12/18  Dexamethasone 12/15 >> 12/80  HPI/Subjective: Continues to feel better. Headache and neck pain seemed to have nearly resolved. No new issues.  Objective: Filed Vitals:   11/12/13 0523 11/12/13 1353 11/12/13 2252 11/13/13 0500  BP: 124/74 149/71 152/88 147/86  Pulse: 74 89 75 60  Temp: 97.8 F (36.6 C) 98.4 F (36.9 C) 97.7 F (36.5 C) 97.7 F (36.5 C)  TempSrc: Oral Oral Oral Oral  Resp:  18 20 16   Height:      Weight:      SpO2: 94% 95% 93% 94%   No intake or output data in the 24 hours ending 11/13/13 1224   Filed Weights   11/10/13 0934 11/10/13 2013  Weight: 109.77 kg (242 lb)  109.6 kg (241 lb 10 oz)    Exam:   Afebrile, vital signs stable, hypoxia, no hypoxia  General: Calm and comfortable.  Cardiovascular: Regular rate and rhythm. No murmur,  rub or gallop.  Respiratory: Clear to auscultation bilaterally. No wheezes, rales rhonchi. Normal respiratory effort.  Neck: Moves well   No focal neurologic deficits noted  Data Reviewed:  Leukocytosis 14.4 >> 16.1 >> 15.6  HSV PCR in CSF positive  Scheduled Meds: . acyclovir  10 mg/kg (Ideal) Intravenous Q8H  . buPROPion  450 mg Oral Daily  . cefTRIAXone (ROCEPHIN)  IV  2 g Intravenous Q12H  . dexamethasone  10 mg Intravenous Q6H  . doxepin  75 mg Oral Daily  . lamoTRIgine  200 mg Oral Daily  . levothyroxine  125 mcg Oral QAC breakfast  . morphine  60 mg Oral Q8H  . pantoprazole  40 mg Oral QHS  . tiZANidine  8 mg Oral TID  . 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

## 2013-11-14 DIAGNOSIS — D72829 Elevated white blood cell count, unspecified: Secondary | ICD-10-CM

## 2013-11-14 LAB — CSF CULTURE W GRAM STAIN

## 2013-11-14 MED ORDER — VALACYCLOVIR HCL 1 G PO TABS: 1000.0000 mg | ORAL_TABLET | Freq: Three times a day (TID) | ORAL | Status: DC

## 2013-11-14 NOTE — Progress Notes (Signed)
IV removed, site WNL.  Pt given d/c instructions and new prescription.  Discussed home care with patient and discussed home medications, patient verbalizes understanding, teachback completed. F/U appointment will be made by pt as the office is closed for lunch, pt states he will make and keep appointment. Pt is stable at this time and desires to go home. Pt taken to main entrance, refused wheelchair, ambulated with no difficulties.

## 2013-11-14 NOTE — Discharge Summary (Signed)
Physician Discharge Summary  Tony Campos [id number] DOB: Jun 28, 1966 DOA: 11/10/2013  PCP: Leo Grosser, MD  Admit date: 11/10/2013 Discharge date: 11/14/2013  Time spent: 35 minutes  Recommendations for Outpatient Follow-up:  1. 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 With some n/v. No diarrhea. With photophobia. No h/o migraines in past. With subj fevers and chills for past 24 hours. No dysuria. No rashes. No cough. Reports having a "scratch" on his penis for which he saw his pcp last week for. Was placed on bactrim, at that time were no vesicles, but pcp thought it may be herpes. He started the bactrim, and then several days later vesicles formed and popped. He still has ulcer on shaft of penis. No recent procedures on his spine or injections, has chronic lower back pain. No sick contacts.   Hospital Course:  This patient was admitted to the hospital worsening headache, fevers. He was found to have herpes simplex meningitis which was confirmed a lumbar puncture. Patient was started on intravenous acyclovir. He was also started empirically on antibiotics and IV steroids. The patient clinically improved, has been afebrile now and headache has improved. He still does have a leukocytosis, but was on steroids until yesterday. He does not appear septic or toxic. We are recommending the patient have repeat CBC in followup with primary care  physician. Case was discussed with infectious disease who recommends 2 weeks of Valtrex. He is otherwise stable for discharge home.  Procedures:  Lumbar puncture  Consultations:  Infectious Disease  Pathology  Discharge Exam: Filed Vitals:   11/14/13 1347  BP: 137/75  Pulse: 82  Temp: 97.7 F (36.5 C)  Resp: 16    General: NAD Cardiovascular: S1, S2 RRR Respiratory: CTA B  Discharge Instructions  Discharge Orders   Future Orders Complete By Expires   Call MD for:  severe uncontrolled pain  As directed    Call MD for:  temperature >100.4  As directed    Diet - low sodium heart healthy  As directed    Increase activity slowly  As directed        Medication List    STOP taking these medications       sulfamethoxazole-trimethoprim 800-160 MG per tablet  Commonly known as:  BACTRIM DS      TAKE these medications       buPROPion 150 MG 24 hr tablet  Commonly known as:  WELLBUTRIN XL  Take 450 mg by mouth daily.     clonazePAM 1 MG tablet  Commonly known as:  KLONOPIN  Take 1 mg by mouth 2 (two) times daily as needed for anxiety.     doxepin 75 MG capsule  Commonly known as:  SINEQUAN  Take 75 mg by mouth daily.     furosemide 40 MG tablet  Commonly known as:  LASIX  Take 1 tablet (40 mg total) by mouth daily.  LAMICTAL 200 MG tablet  Generic drug:  lamoTRIgine  Take 200 mg by mouth daily.     levothyroxine 125 MCG tablet  Commonly known as:  SYNTHROID, LEVOTHROID  Take 125 mcg by mouth daily before breakfast.     morphine 60 MG 12 hr tablet  Commonly known as:  MS CONTIN  Take 60 mg by mouth 3 (three) times daily.     pantoprazole 40 MG tablet  Commonly known as:  PROTONIX  Take 40 mg by mouth at bedtime.     potassium chloride SA 20 MEQ tablet  Commonly known as:  K-DUR,KLOR-CON  Take 1 tablet (20 mEq total) by mouth daily.     pravastatin 20 MG tablet  Commonly known as:  PRAVACHOL  Take 1 tablet (20 mg total) by mouth daily.      testosterone cypionate 200 MG/ML injection  Commonly known as:  DEPOTESTOTERONE CYPIONATE  Inject 200 mg into the muscle every 14 (fourteen) days.     tiZANidine 4 MG tablet  Commonly known as:  ZANAFLEX  Take 2 tablets by mouth 3 (three) times daily.     valACYclovir 1000 MG tablet  Commonly known as:  VALTREX  Take 1 tablet (1,000 mg total) by mouth 3 (three) times daily.       Allergies  Allergen Reactions  . Testosterone     Edema from topical applications       Follow-up Information   Follow up with [facility] TOM, MD. Schedule an appointment as soon as possible for a visit in 1 week. (have cbc checked on follow up appointment)    Specialty:  Family Medicine   Contact information:   [address] [place] [address] [phone]        The results of significant diagnostics from this hospitalization (including imaging, microbiology, ancillary and laboratory) are listed below for reference.    Significant Diagnostic Studies: Ct Head Wo Contrast  11/10/2013   CLINICAL DATA:  Headache with fever, nausea, vomiting, and photophobia  EXAM: CT HEAD WITHOUT CONTRAST  TECHNIQUE: Contiguous axial images were obtained from the base of the skull through the vertex without intravenous contrast. Study was performed within 24 hr of patient's arrival at the emergency department.  COMPARISON:  Brain MRI September 05, 2005  FINDINGS: Ventricles are normal in size and configuration. The right lateral ventricle is slightly larger than the left lateral ventricle, an anatomic variant. There is no mass, hemorrhage, extra-axial fluid collection, or midline shift. Gray-white compartments are normal. No demonstrable acute infarct. Bony calvarium appears intact. The mastoid air cells are clear.  IMPRESSION: Study within normal limits.   Electronically Signed   By: Bretta Bang M.D.   On: 11/10/2013 17:07   Dg Chest Port 1 View  11/10/2013   CLINICAL DATA:  fever with nausea and  vomiting  EXAM: PORTABLE CHEST - 1 VIEW  COMPARISON:  March 18, 2008  FINDINGS: Lungs are clear. Heart is borderline enlarged with normal pulmonary vascularity. No adenopathy. No bone lesions.  IMPRESSION: Heart borderline enlarged.  No edema or consolidation.   Electronically Signed   By: Bretta Bang M.D.   On: 11/10/2013 19:17    Microbiology: Recent Results (from the past 240 hour(s))  CSF CULTURE     Status: None   Collection Time    11/10/13  2:55 PM      Result Value Range Status   Specimen Description CSF   Final   Special Requests NONE  Final   Gram Stain     Final   Value: CYTOSPIN WBC PRESENT, PREDOMINANTLY MONONUCLEAR     NO ORGANISMS SEEN     Performed at [facility]     Performed at [facility]   Culture     Final   Value: NO GROWTH 3 DAYS     Performed at [facility]   Report Status 11/14/2013 FINAL   Final  GC/CHLAMYDIA PROBE AMP     Status: None   Collection Time    11/11/13  2:56 PM      Result Value Range Status   CT Probe RNA NEGATIVE  NEGATIVE Final   GC Probe RNA NEGATIVE  NEGATIVE Final   Comment: (NOTE)                                                                                               **Normal Reference Range: Negative**          Assay performed using the Gen-Probe APTIMA COMBO2 (R) Assay.     Acceptable specimen types for this assay include APTIMA Swabs (Unisex,     endocervical, urethral, or vaginal), first void urine, and ThinPrep     liquid based cytology samples.     Performed at [facility]: Basic Metabolic Panel:  Recent Labs Lab 11/10/13 1051 11/11/13 0540 11/12/13 0658  NA 135 137 136  K 4.2 4.3 4.2  CL 98 105 101  CO2 28 24 26   GLUCOSE 140* 139* 130*  BUN 11 10 13   CREATININE 1.24 1.06 1.09  CALCIUM 9.4 8.6 8.9   Liver Function Tests: No results found for this basename: AST, ALT, ALKPHOS, BILITOT, PROT, ALBUMIN,  in the last 168 hours No results found for this  basename: LIPASE, AMYLASE,  in the last 168 hours No results found for this basename: AMMONIA,  in the last 168 hours CBC:  Recent Labs Lab 11/10/13 1051 11/11/13 0540 11/12/13 0658 11/13/13 0551  WBC 14.1* 14.4* 16.1* 15.6*  NEUTROABS 11.3*  --   --  12.0*  HGB 14.1 12.3* 13.0 13.2  HCT 42.7 37.3* 39.1 39.2  MCV 88.0 89.4 88.1 86.7  PLT 213 206 233 250   Cardiac Enzymes: No results found for this basename: CKTOTAL, CKMB, CKMBINDEX, TROPONINI,  in the last 168 hours BNP: BNP (last 3 results) No results found for this basename: PROBNP,  in the last 8760 hours CBG: No results found for this basename: GLUCAP,  in the last 168 hours     Signed:  MEMON,JEHANZEB  [facility] 11/14/2013, 2:14 PM

## 2013-11-21 ENCOUNTER — Telehealth: Payer: Self-pay | Admitting: Family Medicine

## 2013-11-21 MED ORDER — PANTOPRAZOLE SODIUM 40 MG PO TBEC: 40.0000 mg | DELAYED_RELEASE_TABLET | Freq: Every day | ORAL | Status: DC

## 2013-11-21 NOTE — Telephone Encounter (Signed)
Medication refilled per protocol. 

## 2013-11-25 ENCOUNTER — Telehealth: Payer: Self-pay | Admitting: Family Medicine

## 2013-11-25 MED ORDER — PANTOPRAZOLE SODIUM 40 MG PO TBEC: 40.0000 mg | DELAYED_RELEASE_TABLET | Freq: Every day | ORAL | Status: DC

## 2013-11-25 NOTE — Telephone Encounter (Signed)
Medication refilled per protocol. 

## 2013-11-28 ENCOUNTER — Encounter: Payer: Self-pay | Admitting: Family Medicine

## 2013-11-28 ENCOUNTER — Ambulatory Visit (INDEPENDENT_AMBULATORY_CARE_PROVIDER_SITE_OTHER): Payer: Medicare Other | Admitting: Family Medicine

## 2013-11-28 VITALS — BP 100/72 | HR 72 | Temp 98.6°F | Resp 16 | Ht 72.0 in | Wt 241.0 lb

## 2013-11-28 DIAGNOSIS — Z09 Encounter for follow-up examination after completed treatment for conditions other than malignant neoplasm: Secondary | ICD-10-CM

## 2013-11-28 LAB — COMPLETE METABOLIC PANEL WITH GFR
ALBUMIN: 3.7 g/dL (ref 3.5–5.2)
ALT: 11 U/L (ref 0–53)
AST: 16 U/L (ref 0–37)
Alkaline Phosphatase: 92 U/L (ref 39–117)
BUN: 10 mg/dL (ref 6–23)
CALCIUM: 9.1 mg/dL (ref 8.4–10.5)
CHLORIDE: 100 meq/L (ref 96–112)
CO2: 31 mEq/L (ref 19–32)
Creat: 1.03 mg/dL (ref 0.50–1.35)
GFR, Est African American: >89 mL/min
GFR, Est Non African American: 87 mL/min
Glucose, Bld: 111 mg/dL — ABNORMAL HIGH (ref 70–99)
POTASSIUM: 4.7 meq/L (ref 3.5–5.3)
Sodium: 138 mEq/L (ref 135–145)
Total Bilirubin: 0.4 mg/dL (ref 0.3–1.2)
Total Protein: 6.1 g/dL (ref 6.0–8.3)

## 2013-11-28 LAB — CBC WITH DIFFERENTIAL/PLATELET
BASOS ABS: 0 10*3/uL (ref 0.0–0.1)
Basophils Relative: 0 % (ref 0–1)
Eosinophils Absolute: 0.1 10*3/uL (ref 0.0–0.7)
Eosinophils Relative: 1 % (ref 0–5)
HEMATOCRIT: 39 % (ref 39.0–52.0)
HEMOGLOBIN: 13.5 g/dL (ref 13.0–17.0)
LYMPHS ABS: 2.7 10*3/uL (ref 0.7–4.0)
LYMPHS PCT: 26 % (ref 12–46)
MCH: 29.9 pg (ref 26.0–34.0)
MCHC: 34.6 g/dL (ref 30.0–36.0)
MCV: 86.5 fL (ref 78.0–100.0)
MONO ABS: 1 10*3/uL (ref 0.1–1.0)
Monocytes Relative: 10 % (ref 3–12)
NEUTROS ABS: 6.5 10*3/uL (ref 1.7–7.7)
Neutrophils Relative %: 63 % (ref 43–77)
Platelets: 206 10*3/uL (ref 150–400)
RBC: 4.51 MIL/uL (ref 4.22–5.81)
RDW: 15.3 % (ref 11.5–15.5)
WBC: 10.4 10*3/uL (ref 4.0–10.5)

## 2013-11-28 MED ORDER — VALACYCLOVIR HCL 500 MG PO TABS: 500.0000 mg | ORAL_TABLET | Freq: Two times a day (BID) | ORAL | Status: DC

## 2013-11-28 NOTE — Progress Notes (Signed)
Subjective:    Patient ID: Tony Campos, male    DOB: [birth year], 48 y.o.   MRN: [id number]  HPI Patient was seen 12/11 and was diagnosed with a lesion on his penis consistent with a healing herpes infection.  Unfortunately, he apparently developed a secondary cellulitis and was started on Bactrim to cover the secondary cellulitis. At that time the patient was adamant that he had not been exposed to herpes and declined valtrex.  Furthermore he was outside the therapeutic window (72 hrs)  for Valtrex.  Shortly thereafter he was admitted to the hospital with a headache and neck pain and was diagnosed with herpes simplex meningitis.  I have copied the relevant portions of his discharge summary and pasted them into the office visit below: Admit date: 11/10/2013  Discharge date: 11/14/2013  Recommendations for Outpatient Follow-up:  1. 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 With some n/v. No diarrhea. With photophobia. No h/o migraines in past. With subj fevers and chills for past 24 hours. No dysuria. No rashes. No cough. Reports having a "scratch" on his penis for which he saw his pcp last week for. Was placed on bactrim, at that time were no vesicles, but pcp thought it may be herpes. He started the bactrim, and then several days later vesicles formed and popped. He still has ulcer on shaft of penis. No recent procedures on his spine or injections, has chronic lower back pain. No sick contacts.  Hospital Course:  This patient was admitted to the hospital worsening headache, fevers. He was found to have herpes simplex meningitis which was confirmed a lumbar  puncture. Patient was started on intravenous acyclovir. He was also started empirically on antibiotics and IV steroids. The patient clinically improved, has been afebrile now and headache has improved. He still does have a leukocytosis, but was on steroids until yesterday. He does not appear septic or toxic. We are recommending the patient have repeat CBC in followup with primary care physician. Case was discussed with infectious disease who recommends 2 weeks of Valtrex. He is otherwise stable for discharge home.  Procedures:  Lumbar puncture  He is here today for follow up.  He is doing well since leaving the hospital. He still has a mild dull headache but it is much better. He denies any neck stiffness. The lesion on his penis is completely healed. There is no further redness on his penis. He denies any nausea or vomiting. He denies any photophobia. He denies any other rashes. This is his first known outbreak of herpes.  His wife has never had herpes. Past Medical History  Diagnosis Date  . Acid reflux   . Anxiety   . Depression   . Hypothyroidism   . Hypertension   . Hyperlipidemia   . Testosterone deficiency   . Spondylosis   . Genital warts   . Smoker    Past Surgical History  Procedure Laterality Date  . Knee surgery      right  . Wrist surgery      right and left  . Cholecystectomy  2007  . Nasal septum surgery    . Nasal sinus surgery  Current Outpatient Prescriptions on File Prior to Visit  Medication Sig Dispense Refill  . buPROPion (WELLBUTRIN XL) 150 MG 24 hr tablet Take 450 mg by mouth daily.      . clonazePAM (KLONOPIN) 1 MG tablet Take 1 mg by mouth 2 (two) times daily as needed for anxiety.      Marland Kitchen doxepin (SINEQUAN) 75 MG capsule Take 75 mg by mouth daily.      . furosemide (LASIX) 40 MG tablet Take 1 tablet (40 mg total) by mouth daily.  30 tablet  3  . lamoTRIgine (LAMICTAL) 200 MG tablet Take 200 mg by mouth daily.      Marland Kitchen levothyroxine (SYNTHROID, LEVOTHROID)  125 MCG tablet Take 125 mcg by mouth daily before breakfast.      . morphine (MS CONTIN) 60 MG 12 hr tablet Take 60 mg by mouth 3 (three) times daily.      . pantoprazole (PROTONIX) 40 MG tablet Take 1 tablet (40 mg total) by mouth at bedtime.  90 tablet  3  . potassium chloride SA (K-DUR,KLOR-CON) 20 MEQ tablet Take 1 tablet (20 mEq total) by mouth daily.  30 tablet  3  . pravastatin (PRAVACHOL) 20 MG tablet Take 1 tablet (20 mg total) by mouth daily.  90 tablet  3  . testosterone cypionate (DEPOTESTOTERONE CYPIONATE) 200 MG/ML injection Inject 200 mg into the muscle every 14 (fourteen) days.      Marland Kitchen tiZANidine (ZANAFLEX) 4 MG tablet Take 2 tablets by mouth 3 (three) times daily.      . valACYclovir (VALTREX) 1000 MG tablet Take 1 tablet (1,000 mg total) by mouth 3 (three) times daily.  42 tablet  0  . [DISCONTINUED] esomeprazole (NEXIUM) 40 MG capsule Take 40 mg by mouth daily before breakfast.        . [DISCONTINUED] ibuprofen (ADVIL,MOTRIN) 800 MG tablet Take 800 mg by mouth every 8 (eight) hours as needed.      . [DISCONTINUED] potassium chloride (KLOR-CON) 10 MEQ CR tablet Take 10 mEq by mouth daily.         Current Facility-Administered Medications on File Prior to Visit  Medication Dose Route Frequency Provider Last Rate Last Dose  . testosterone cypionate (DEPOTESTOTERONE CYPIONATE) injection 200 mg  200 mg Intramuscular Q14 Days Susy Frizzle, MD   200 mg at 07/21/13 0944   Allergies  Allergen Reactions  . Testosterone     Edema from topical applications   History   Social History  . Marital Status: Married    Spouse Name: N/A    Number of Children: N/A  . Years of Education: N/A   Occupational History  . [employer] Part Time    Social History Main Topics  . Smoking status: Current Every Day Smoker -- 1.00 packs/day    Types: Cigarettes  . Smokeless tobacco: Not on file  . Alcohol Use: No  . Drug Use: No  . Sexual Activity: Not on file   Other  Topics Concern  . Not on file   Social History Narrative  . No narrative on file    Review of Systems  All other systems reviewed and are negative.       Objective:   Physical Exam  Vitals reviewed. Constitutional: He is oriented to person, place, and time.  Cardiovascular: Normal rate, regular rhythm and normal heart sounds.   No murmur heard. Pulmonary/Chest: Effort normal and breath sounds normal. No respiratory distress. He has no wheezes. He has  no rales. He exhibits no tenderness.  Abdominal: Soft. Bowel sounds are normal. He exhibits no distension. There is no tenderness. There is no rebound.  Musculoskeletal: Normal range of motion.  Neurological: He is alert and oriented to person, place, and time. He has normal reflexes. No cranial nerve deficit. He exhibits normal muscle tone. Coordination normal.          Assessment & Plan:     1. Hospital discharge follow-up I will repeat a CBC to ensure that his leukocytosis has resolved. I also gave patient prescription for Valtrex 500 mg by mouth twice a day to be taken for 3 days. I explained to the patient he should take this at the first sign of any future herpes outbreak. He must take it within the first 72 hours for the meds to be beneficial. I also spent approximately 20 minutes explaining the natural history of herpes, epidemiology of the virus, and how to prevent spread.  Should he have frequent outbreaks in the future, I explained to the patient he could take Valtrex 500 mg by mouth daily to prevent future outbreaks. The time being, we will monitor the patient clinically the - CBC with Differential - COMPLETE METABOLIC PANEL WITH GFR

## 2013-12-02 ENCOUNTER — Other Ambulatory Visit: Payer: Self-pay | Admitting: Family Medicine

## 2013-12-02 MED ORDER — FUROSEMIDE 40 MG PO TABS: 40.0000 mg | ORAL_TABLET | Freq: Every day | ORAL | Status: DC

## 2013-12-11 LAB — ECHOVIRUS ABS PANEL (CSF)
Echovirus Ab Type 11: UNDETERMINED
Echovirus Ab Type 30: UNDETERMINED
Echovirus Ab Type 7: UNDETERMINED
Echovirus Ab Type 9: UNDETERMINED
Echovirus Ab, type 4: UNDETERMINED

## 2013-12-19 ENCOUNTER — Telehealth: Payer: Self-pay | Admitting: Family Medicine

## 2013-12-19 NOTE — Telephone Encounter (Signed)
Pt is stating that the lassix is not working on his legs dealing with swelling What does the pt need to do? Call back number is [phone]

## 2013-12-22 NOTE — Telephone Encounter (Signed)
Patient aware.

## 2013-12-22 NOTE — Telephone Encounter (Signed)
I would recommend compression socks otc /TED hose that are below the knee height.

## 2014-01-06 ENCOUNTER — Other Ambulatory Visit: Payer: Self-pay | Admitting: Family Medicine

## 2014-01-06 MED ORDER — PANTOPRAZOLE SODIUM 40 MG PO TBEC: 40.0000 mg | DELAYED_RELEASE_TABLET | Freq: Every day | ORAL | Status: DC

## 2014-01-06 NOTE — Telephone Encounter (Signed)
Rx Refilled  

## 2014-02-16 ENCOUNTER — Ambulatory Visit (INDEPENDENT_AMBULATORY_CARE_PROVIDER_SITE_OTHER): Payer: Medicare Other | Admitting: Family Medicine

## 2014-02-16 ENCOUNTER — Encounter: Payer: Self-pay | Admitting: Family Medicine

## 2014-02-16 VITALS — BP 140/78 | HR 86 | Temp 97.6°F | Resp 18 | Ht 72.0 in | Wt 236.0 lb

## 2014-02-16 DIAGNOSIS — R5383 Other fatigue: Secondary | ICD-10-CM

## 2014-02-16 DIAGNOSIS — R609 Edema, unspecified: Secondary | ICD-10-CM

## 2014-02-16 DIAGNOSIS — R5381 Other malaise: Secondary | ICD-10-CM

## 2014-02-16 MED ORDER — POTASSIUM CHLORIDE CRYS ER 20 MEQ PO TBCR: 20.0000 meq | EXTENDED_RELEASE_TABLET | Freq: Every day | ORAL | Status: DC

## 2014-02-16 MED ORDER — FUROSEMIDE 40 MG PO TABS: 40.0000 mg | ORAL_TABLET | Freq: Every day | ORAL | Status: DC

## 2014-02-16 NOTE — Progress Notes (Signed)
Subjective:    Patient ID: Tony Campos, male    DOB: 28-Jul-1966, 48 y.o.   MRN: [id number]  HPI Patient continues to complain of swelling in both legs distal to his knee. He had an echocardiogram performed heart last year which showed grade 1 diastolic dysfunction but was otherwise normal. Evaluation of the kidneys and liver have been normal. The patient has a remote history of a sleep study which was normal although he has not been tested for sleep apnea recently. The patient tried 40 mg a day of Lasix which did not seem to help substantially. He did wear the compression stockings which seemed to help somewhat. The swelling is gone today in his legs but he states it comes and goes on a daily basis. He admits to eating high sodium diet. The swelling tends to be worse when he is standing for a prolonged period of time. He also complains of fatigue. He recently discontinued his testosterone to see if that would help as well. The swelling did not improve without the testosterone. The patient does not believe that her fatigue worsened on the testosterone either. Past Medical History  Diagnosis Date  . Acid reflux   . Anxiety   . Depression   . Hypothyroidism   . Hypertension   . Hyperlipidemia   . Testosterone deficiency   . Spondylosis   . Genital warts   . Smoker    Current Outpatient Prescriptions on File Prior to Visit  Medication Sig Dispense Refill  . buPROPion (WELLBUTRIN XL) 150 MG 24 hr tablet Take 450 mg by mouth daily.      . clonazePAM (KLONOPIN) 1 MG tablet Take 1 mg by mouth 2 (two) times daily as needed for anxiety.      Marland Kitchen doxepin (SINEQUAN) 75 MG capsule Take 75 mg by mouth daily.      Marland Kitchen lamoTRIgine (LAMICTAL) 200 MG tablet Take 200 mg by mouth daily.      Marland Kitchen levothyroxine (SYNTHROID, LEVOTHROID) 125 MCG tablet Take 125 mcg by mouth daily before breakfast.      . morphine (MS CONTIN) 60 MG 12 hr tablet Take 60 mg by mouth 3 (three) times daily.      . pantoprazole  (PROTONIX) 40 MG tablet Take 1 tablet (40 mg total) by mouth at bedtime.  90 tablet  3  . pravastatin (PRAVACHOL) 20 MG tablet Take 1 tablet (20 mg total) by mouth daily.  90 tablet  3  . tiZANidine (ZANAFLEX) 4 MG tablet Take 2 tablets by mouth 3 (three) times daily.      . valACYclovir (VALTREX) 1000 MG tablet Take 1 tablet (1,000 mg total) by mouth 3 (three) times daily.  42 tablet  0  . valACYclovir (VALTREX) 500 MG tablet Take 1 tablet (500 mg total) by mouth 2 (two) times daily.  6 tablet  1  . testosterone cypionate (DEPOTESTOTERONE CYPIONATE) 200 MG/ML injection Inject 200 mg into the muscle every 14 (fourteen) days.      . [DISCONTINUED] esomeprazole (NEXIUM) 40 MG capsule Take 40 mg by mouth daily before breakfast.        . [DISCONTINUED] ibuprofen (ADVIL,MOTRIN) 800 MG tablet Take 800 mg by mouth every 8 (eight) hours as needed.      . [DISCONTINUED] potassium chloride (KLOR-CON) 10 MEQ CR tablet Take 10 mEq by mouth daily.         Current Facility-Administered Medications on File Prior to Visit  Medication Dose Route Frequency Provider Last Rate  Last Dose  . testosterone cypionate (DEPOTESTOTERONE CYPIONATE) injection 200 mg  200 mg Intramuscular Q14 Days Susy Frizzle, MD   200 mg at 07/21/13 0944   Allergies  Allergen Reactions  . Testosterone     Edema from topical applications   History   Social History  . Marital Status: Married    Spouse Name: N/A    Number of Children: N/A  . Years of Education: N/A   Occupational History  . [employer] at [employer] Part Time    Social History Main Topics  . Smoking status: Current Every Day Smoker -- 1.00 packs/day    Types: Cigarettes  . Smokeless tobacco: Not on file  . Alcohol Use: No  . Drug Use: No  . Sexual Activity: Not on file   Other Topics Concern  . Not on file   Social History Narrative  . No narrative on file      Review of Systems  All other systems reviewed and are negative.         Objective:   Physical Exam  Vitals reviewed. Constitutional: He appears well-developed and well-nourished. No distress.  Neck: Neck supple. No JVD present. No thyromegaly present.  Cardiovascular: Normal rate, regular rhythm and normal heart sounds.   No murmur heard. Pulmonary/Chest: Effort normal and breath sounds normal. No respiratory distress. He has no wheezes. He has no rales.  Abdominal: Soft. Bowel sounds are normal. He exhibits no distension. There is no tenderness. There is no rebound.  Musculoskeletal: He exhibits no edema.  Lymphadenopathy:    He has no cervical adenopathy.  Skin: He is not diaphoretic.          Assessment & Plan:  Edema  Other malaise and fatigue  I believe the patient's fatigue is likely multi-factorial due to the polypharmacy, testosterone deficiency, and smoking. Recommended smoking cessation. I recommended resuming his testosterone. I also recommended a sleep study to evaluate for obstructive sleep apnea. Believe the swelling in his legs is likely due to chronic venous insufficiency, diastolic dysfunction, and a high sodium diet. I recommended a low-sodium diet, wearing compression hose, continuing Lasix 40 mg by mouth daily. The patient can take 80 mg by mouth daily on bad days. If the patient takes 80 mg more than 1-2 times a week I would like him to return for a BMP to make sure we're not causing hypokalemia or exacerbating his renal function

## 2014-02-17 ENCOUNTER — Other Ambulatory Visit: Payer: Self-pay | Admitting: Family Medicine

## 2014-02-17 MED ORDER — PANTOPRAZOLE SODIUM 40 MG PO TBEC: 40.0000 mg | DELAYED_RELEASE_TABLET | Freq: Two times a day (BID) | ORAL | Status: DC

## 2014-02-17 NOTE — Telephone Encounter (Signed)
Rx for protonix sent in for bid dosing - ok'd by Dr. Dennard Schaumann

## 2014-02-18 ENCOUNTER — Telehealth: Payer: Self-pay | Admitting: Family Medicine

## 2014-02-18 NOTE — Telephone Encounter (Signed)
PA submitted through [email or web address] / [email or web address]

## 2014-02-20 NOTE — Telephone Encounter (Signed)
Approved through 11/26/14 - pharmacy called and made aware of approval

## 2014-04-27 ENCOUNTER — Other Ambulatory Visit: Payer: Self-pay | Admitting: Family Medicine

## 2014-04-27 DIAGNOSIS — E785 Hyperlipidemia, unspecified: Secondary | ICD-10-CM

## 2014-04-27 MED ORDER — PRAVASTATIN SODIUM 20 MG PO TABS: 20.0000 mg | ORAL_TABLET | Freq: Every day | ORAL | Status: DC

## 2014-04-27 NOTE — Telephone Encounter (Signed)
Rx Refilled  

## 2014-07-21 ENCOUNTER — Other Ambulatory Visit: Payer: Self-pay | Admitting: Family Medicine

## 2014-07-21 NOTE — Telephone Encounter (Signed)
Refill appropriate and filled per protocol. 

## 2014-08-05 ENCOUNTER — Telehealth: Payer: Self-pay | Admitting: Family Medicine

## 2014-08-05 MED ORDER — PANTOPRAZOLE SODIUM 40 MG PO TBEC: 40.0000 mg | DELAYED_RELEASE_TABLET | Freq: Two times a day (BID) | ORAL | Status: DC

## 2014-08-05 NOTE — Telephone Encounter (Signed)
Patient would like a call regarding his oprazole said there is a mix up with up  [phone]

## 2014-08-05 NOTE — Telephone Encounter (Signed)
Med sent to pharm and pt aware 

## 2014-08-20 ENCOUNTER — Encounter (HOSPITAL_COMMUNITY): Payer: Self-pay | Admitting: Emergency Medicine

## 2014-08-20 ENCOUNTER — Emergency Department (HOSPITAL_COMMUNITY)
Admission: EM | Admit: 2014-08-20 | Discharge: 2014-08-20 | Disposition: A | Discharge status: Home / Self Care | Payer: Medicare Other | Attending: Emergency Medicine | Admitting: Emergency Medicine

## 2014-08-20 DIAGNOSIS — R112 Nausea with vomiting, unspecified: Secondary | ICD-10-CM | POA: Diagnosis not present

## 2014-08-20 DIAGNOSIS — B003 Herpesviral meningitis: Secondary | ICD-10-CM | POA: Diagnosis not present

## 2014-08-20 DIAGNOSIS — E039 Hypothyroidism, unspecified: Secondary | ICD-10-CM | POA: Diagnosis not present

## 2014-08-20 DIAGNOSIS — F3289 Other specified depressive episodes: Secondary | ICD-10-CM | POA: Insufficient documentation

## 2014-08-20 DIAGNOSIS — M549 Dorsalgia, unspecified: Secondary | ICD-10-CM | POA: Insufficient documentation

## 2014-08-20 DIAGNOSIS — R51 Headache: Secondary | ICD-10-CM | POA: Diagnosis present

## 2014-08-20 DIAGNOSIS — F411 Generalized anxiety disorder: Secondary | ICD-10-CM | POA: Insufficient documentation

## 2014-08-20 DIAGNOSIS — F17200 Nicotine dependence, unspecified, uncomplicated: Secondary | ICD-10-CM | POA: Insufficient documentation

## 2014-08-20 DIAGNOSIS — M542 Cervicalgia: Secondary | ICD-10-CM | POA: Diagnosis not present

## 2014-08-20 DIAGNOSIS — F329 Major depressive disorder, single episode, unspecified: Secondary | ICD-10-CM | POA: Insufficient documentation

## 2014-08-20 DIAGNOSIS — Z79899 Other long term (current) drug therapy: Secondary | ICD-10-CM | POA: Insufficient documentation

## 2014-08-20 DIAGNOSIS — R519 Headache, unspecified: Secondary | ICD-10-CM

## 2014-08-20 DIAGNOSIS — K219 Gastro-esophageal reflux disease without esophagitis: Secondary | ICD-10-CM | POA: Insufficient documentation

## 2014-08-20 DIAGNOSIS — E785 Hyperlipidemia, unspecified: Secondary | ICD-10-CM | POA: Insufficient documentation

## 2014-08-20 DIAGNOSIS — I10 Essential (primary) hypertension: Secondary | ICD-10-CM | POA: Insufficient documentation

## 2014-08-20 HISTORY — DX: Meningitis, unspecified: G03.9

## 2014-08-20 LAB — CBC WITH DIFFERENTIAL/PLATELET
Basophils Absolute: 0 10*3/uL (ref 0.0–0.1)
Basophils Relative: 0 % (ref 0–1)
Eosinophils Absolute: 0 10*3/uL (ref 0.0–0.7)
Eosinophils Relative: 0 % (ref 0–5)
HEMATOCRIT: 39.7 % (ref 39.0–52.0)
Hemoglobin: 13.6 g/dL (ref 13.0–17.0)
LYMPHS ABS: 1.6 10*3/uL (ref 0.7–4.0)
Lymphocytes Relative: 12 % (ref 12–46)
MCH: 30.4 pg (ref 26.0–34.0)
MCHC: 34.3 g/dL (ref 30.0–36.0)
MCV: 88.6 fL (ref 78.0–100.0)
MONO ABS: 0.6 10*3/uL (ref 0.1–1.0)
Monocytes Relative: 5 % (ref 3–12)
NEUTROS ABS: 10.5 10*3/uL — AB (ref 1.7–7.7)
NEUTROS PCT: 83 % — AB (ref 43–77)
Platelets: 221 10*3/uL (ref 150–400)
RBC: 4.48 MIL/uL (ref 4.22–5.81)
RDW: 14.1 % (ref 11.5–15.5)
WBC: 12.7 10*3/uL — ABNORMAL HIGH (ref 4.0–10.5)

## 2014-08-20 LAB — BASIC METABOLIC PANEL
Anion gap: 10 (ref 5–15)
BUN: 11 mg/dL (ref 6–23)
CO2: 30 mEq/L (ref 19–32)
Calcium: 9.5 mg/dL (ref 8.4–10.5)
Chloride: 100 mEq/L (ref 96–112)
Creatinine, Ser: 0.93 mg/dL (ref 0.50–1.35)
GFR calc Af Amer: >90 mL/min (ref >90)
GFR calc non Af Amer: >90 mL/min (ref >90)
GLUCOSE: 138 mg/dL — AB (ref 70–99)
POTASSIUM: 4 meq/L (ref 3.7–5.3)
SODIUM: 140 meq/L (ref 137–147)

## 2014-08-20 LAB — SEDIMENTATION RATE: Sed Rate: 22 mm/hr — ABNORMAL HIGH (ref 0–16)

## 2014-08-20 MED ORDER — HYDROMORPHONE HCL 1 MG/ML IJ SOLN
1.0000 mg | Freq: Once | INTRAMUSCULAR | Status: AC
  Administered 2014-08-20: 1 mg via INTRAVENOUS
  Filled 2014-08-20: qty 1

## 2014-08-20 MED ORDER — VALACYCLOVIR HCL 500 MG PO TABS
1000.0000 mg | ORAL_TABLET | Freq: Once | ORAL | Status: AC
  Administered 2014-08-20: 1000 mg via ORAL
  Filled 2014-08-20: qty 2

## 2014-08-20 MED ORDER — PROMETHAZINE HCL 25 MG/ML IJ SOLN
25.0000 mg | Freq: Once | INTRAMUSCULAR | Status: AC
  Administered 2014-08-20: 25 mg via INTRAVENOUS
  Filled 2014-08-20: qty 1

## 2014-08-20 MED ORDER — FENTANYL CITRATE 0.05 MG/ML IJ SOLN
50.0000 ug | Freq: Once | INTRAMUSCULAR | Status: DC
  Filled 2014-08-20: qty 2

## 2014-08-20 MED ORDER — VALACYCLOVIR HCL 1 G PO TABS: 1000.0000 mg | ORAL_TABLET | Freq: Three times a day (TID) | ORAL | Status: AC

## 2014-08-20 MED ORDER — KETOROLAC TROMETHAMINE 30 MG/ML IJ SOLN
30.0000 mg | Freq: Once | INTRAMUSCULAR | Status: AC
  Administered 2014-08-20: 30 mg via INTRAVENOUS
  Filled 2014-08-20: qty 1

## 2014-08-20 MED ORDER — SODIUM CHLORIDE 0.9 % IV BOLUS (SEPSIS)
500.0000 mL | Freq: Once | INTRAVENOUS | Status: AC
  Administered 2014-08-20: 500 mL via INTRAVENOUS

## 2014-08-20 NOTE — ED Notes (Signed)
MD at bedside. 

## 2014-08-20 NOTE — Discharge Instructions (Signed)
Herpes Simplex Herpes simplex is generally classified as Type 1 or Type 2. Type 1 is generally the type that is responsible for cold sores. Type 2 is generally associated with sexually transmitted diseases. We now know that most of the thoughts on these viruses are inaccurate. We find that HSV1 is also present genitally and HSV2 can be present orally, but this will vary in different locations of the world. Herpes simplex is usually detected by doing a culture. Blood tests are also available for this virus; however, the accuracy is often not as good.  PREPARATION FOR TEST No preparation or fasting is necessary. NORMAL FINDINGS  No virus present  No HSV antigens or antibodies present Ranges for normal findings may vary among different laboratories and hospitals. You should always check with your doctor after having lab work or other tests done to discuss the meaning of your test results and whether your values are considered within normal limits. MEANING OF TEST  Your caregiver will go over the test results with you and discuss the importance and meaning of your results, as well as treatment options and the need for additional tests if necessary. OBTAINING THE TEST RESULTS  It is your responsibility to obtain your test results. Ask the lab or department performing the test when and how you will get your results. Document Released: 12/16/2004 Document Revised: 02/05/2012 Document Reviewed: 10/24/2008 [facility] Patient Information 2015 [place], [place]. This information is not intended to replace advice given to you by your health care provider. Make sure you discuss any questions you have with your health care provider.    Viral Meningitis Meningitis is an inflammation of the fluid and membranes surrounding the spinal cord and brain. Viral meningitis is caused by a viral infection. It is important to know whether a virus or bacterium is causing your meningitis. Viral meningitis is generally less  severe than bacterial meningitis. Aseptic meningitis is any meningitis not caused by a bacterial infection, including viral meningitis. Meningitis can also be caused by fungi, parasites, certain medicines, cancer, and autoimmune disorders. Uncomplicated meningitis usually resolves on its own in 7 to 10 days.However, there can be complicated cases that last much longer. People with lowered immune systems are at greater risk for poor outcomes from meningitis.It is important to get treatment as soon as possible to minimize the impact of a meningitis infection. Long-term complications could include seizures, hearing loss, weakness, paralysis, blindness, or cognitive impairment. CAUSES Most cases of meningitis are due to viruses. Viruses that cause meningitis include enteroviruses (such as polio and coxsackie viruses), herpes simplex virus, varicella zoster virus, mumps, and HIV. SYMPTOMS  Symptoms can develop over many hours. They may even take a few days to develop. Common symptoms of meningitis in people over the age of 2 years include:  High fever.  Headache.  Stiff neck.  Irritability.  Nausea and vomiting.  Fatigue.  Discomfort from exposure to light.  Discomfort from exposure to loud noise.  Trouble walking.  Altered mental status.  Seizures. DIAGNOSIS  Early diagnosis and treatment are very important. If you have symptoms, you should see your caregiver right away. Your evaluation may include lab tests of your blood and spinal fluid. A spinal fluid sample is taken through a procedure called a lumbar puncture or spinal tap. During the procedure, a needle is inserted into an area in the lower back. You may also have a CT scan of your brain as part of your evaluation.If there is suspicion of an infection or inflammation  of the brain (encephalitis), an MRI scan may be done. TREATMENT   Antibiotics are not effective against a virus. Antiviral medicines may be used depending on the  cause of your meningitis.  Treatment for viral meningitis focuses on reducing your symptoms. This may include rest, hydration, and medicines to reduce fever and pain.  Steroids may also be used if there is significant swelling of the brain. PREVENTION  Vaccines can help prevent meningitis caused by polio, measles, and mumps.  Strict hand washing is effective in controlling the spread of many causes of meningitis.  Using barrier protection during sexual intercourse can prevent the spread of meningitis caused by viruses such as the herpes virus or HIV.  Protection from mosquitoes, especially for the very young and very old, can prevent some causes of meningitis. HOME CARE INSTRUCTIONS  Rest.  Drink enough water and fluids to keep your urine clear or pale yellow.  Take all medicine as prescribed.  Practice good hygiene to prevent others from getting sick.  Follow up with your caregiver as directed. SEEK IMMEDIATE MEDICAL CARE IF:  You develop dizziness.  You have a very fast heartbeat.  You have difficulty breathing.  You develop confusion.  You have seizures.  You have unstoppable nausea and vomiting.  You develop any worsening symptoms. MAKE SURE YOU:  Understand these instructions.  Will watch your condition.  Will get help right away if you are not doing well or get worse. Document Released: 11/03/2002 Document Revised: 02/05/2012 Document Reviewed: 02/28/2011 [facility] Patient Information 2015 [place], [place]. This information is not intended to replace advice given to you by your health care provider. Make sure you discuss any questions you have with your health care provider.

## 2014-08-20 NOTE — ED Notes (Signed)
Pt c/o headache x 3 days, lower back ache, and vomiting. States he has hx of meningitis.

## 2014-08-20 NOTE — ED Notes (Signed)
Patient awake, alert and oriented. Drowsy.

## 2014-08-20 NOTE — ED Notes (Signed)
Patient with no complaints at this time. Respirations even and unlabored. Skin warm/dry. Discharge instructions reviewed with patient at this time. Patient given opportunity to voice concerns/ask questions. IV removed per policy and band-aid applied to site. Patient discharged at this time and left Emergency Department with steady gait.  

## 2014-08-20 NOTE — ED Provider Notes (Signed)
8:22 AM  Assumed care from Dr. Alvino Chapel.  Pt is a 48 y.o. 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 He is back today with headache, vomiting but he reports no similar to his prior episode of HSV meningitis. He recently had an outbreak of genital herpes the week ago and was treated. Labs in the ED have shown a mild leukocytosis of 12.7 with left shift. ESR slightly elevated. Hemodynamically stable. No fever. Dr. Alvino Chapel discussed case with Dr. Linus Salmons with infectious disease. 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  Patient does not need a lumbar puncture at this time. Low suspicion for bacterial meningitis. Dr. Linus Salmons has recommended 5 days of outpatient valcyclovir treatment. We'll discharge patient home with return precautions and supportive care instructions. He verbalizes understanding and is comfortable with plan.  [place], DO 08/20/14 [phone]

## 2014-08-20 NOTE — ED Provider Notes (Addendum)
CSN: [id number]     Arrival date & time 08/20/14  0543 History   First MD Initiated Contact with Patient 08/20/14 [phone]     Chief Complaint  Patient presents with  . Headache  . Emesis     (Consider location/radiation/quality/duration/timing/severity/associated sxs/prior Treatment) Patient is a 48 y.o. male presenting with headaches and vomiting. The history is provided by the patient.  Headache Pain location:  Frontal Quality:  Dull Associated symptoms: back pain, fever, nausea, neck pain and vomiting   Associated symptoms: no abdominal pain, no diarrhea, no pain, no neck stiffness and no numbness   Emesis Associated symptoms: headaches   Associated symptoms: no abdominal pain and no diarrhea    patient presents with a three-day history of headache. He has had nausea and vomiting. States he may have had some fevers. He's had some chills. He states he has back pain too but this is chronic. He has a history of herpes simplex meningitis states that felt like this. He has not had frequent headaches since the meningitis. Mild blurring of his vision. No confusion. He states he had a herpes outbreak a couple weeks ago and finished up a course of valacyclovir.  Past Medical History  Diagnosis Date  . Acid reflux   . Anxiety   . Depression   . Hypothyroidism   . Hypertension   . Hyperlipidemia   . Testosterone deficiency   . Spondylosis   . Genital warts   . Smoker   . Meningitis    Past Surgical History  Procedure Laterality Date  . Knee surgery      right  . Wrist surgery      right and left  . Cholecystectomy  2007  . Nasal septum surgery    . Nasal sinus surgery     Family History  Problem Relation Age of Onset  . Cancer Maternal Grandfather     stomach   History  Substance Use Topics  . Smoking status: Current Every Day Smoker -- 1.00 packs/day    Types: Cigarettes  . Smokeless tobacco: Not on file  . Alcohol Use: No    Review of Systems  Constitutional:  Positive for fever. Negative for activity change and appetite change.  Eyes: Negative for pain.  Respiratory: Negative for chest tightness and shortness of breath.   Cardiovascular: Negative for chest pain and leg swelling.  Gastrointestinal: Positive for nausea and vomiting. Negative for abdominal pain and diarrhea.  Genitourinary: Negative for flank pain.  Musculoskeletal: Positive for back pain and neck pain. Negative for neck stiffness.  Skin: Negative for rash.  Neurological: Positive for headaches. Negative for weakness and numbness.  Psychiatric/Behavioral: Negative for behavioral problems.      Allergies  Testosterone  Home Medications   Prior to Admission medications   Medication Sig Start Date End Date Taking? Authorizing Provider  buPROPion (WELLBUTRIN XL) 150 MG 24 hr tablet Take 450 mg by mouth daily.    Historical Provider, MD  clonazePAM (KLONOPIN) 1 MG tablet Take 1 mg by mouth 2 (two) times daily as needed for anxiety.    Historical Provider, MD  doxepin (SINEQUAN) 75 MG capsule Take 75 mg by mouth daily.    Historical Provider, MD  furosemide (LASIX) 40 MG tablet TAKE 1 TABLET BY MOUTH EVERY DAY 07/21/14   Susy Frizzle, MD  lamoTRIgine (LAMICTAL) 200 MG tablet Take 200 mg by mouth daily.    Historical Provider, MD  levothyroxine (SYNTHROID, LEVOTHROID) 125 MCG tablet Take 125 mcg  by mouth daily before breakfast.    Historical Provider, MD  morphine (MS CONTIN) 60 MG 12 hr tablet Take 60 mg by mouth 3 (three) times daily.    Historical Provider, MD  pantoprazole (PROTONIX) 40 MG tablet Take 1 tablet (40 mg total) by mouth 2 (two) times daily. 08/05/14   Susy Frizzle, MD  potassium chloride SA (K-DUR,KLOR-CON) 20 MEQ tablet Take 1 tablet (20 mEq total) by mouth daily. 02/16/14   Susy Frizzle, MD  pravastatin (PRAVACHOL) 20 MG tablet Take 1 tablet (20 mg total) by mouth daily. 04/27/14   Susy Frizzle, MD  testosterone cypionate (DEPOTESTOTERONE CYPIONATE) 200  MG/ML injection Inject 200 mg into the muscle every 14 (fourteen) days.    Historical Provider, MD  tiZANidine (ZANAFLEX) 4 MG tablet Take 2 tablets by mouth 3 (three) times daily. 05/21/13   Historical Provider, MD  valACYclovir (VALTREX) 1000 MG tablet Take 1 tablet (1,000 mg total) by mouth 3 (three) times daily. 11/14/13   Kathie Dike, MD  valACYclovir (VALTREX) 500 MG tablet Take 1 tablet (500 mg total) by mouth 2 (two) times daily. 11/28/13   Susy Frizzle, MD   BP 151/93  Pulse 96  Temp(Src) 99.5 F (37.5 C) (Oral)  Resp 20  Ht 6' (1.829 m)  Wt 225 lb (102.059 kg)  BMI 30.51 kg/m2  SpO2 96% Physical Exam  Nursing note and vitals reviewed. Constitutional: He is oriented to person, place, and time. He appears well-developed and well-nourished.  Patient appears uncomfortable  HENT:  Head: Normocephalic and atraumatic.  Eyes: EOM are normal. Pupils are equal, round, and reactive to light.  Neck: Normal range of motion. Neck supple.  Patient with good range of motion of his neck laterally, but some pain with flexion  Cardiovascular: Normal rate, regular rhythm and normal heart sounds.   No murmur heard. Pulmonary/Chest: Effort normal and breath sounds normal.  Abdominal: Soft. Bowel sounds are normal. He exhibits no distension and no mass. There is no tenderness. There is no rebound and no guarding.  Musculoskeletal: Normal range of motion. He exhibits no edema.  Neurological: He is alert and oriented to person, place, and time. No cranial nerve deficit.  Skin: Skin is warm and dry.  Psychiatric: He has a normal mood and affect.    ED Course  Procedures (including critical care time) Labs Review Labs Reviewed  CBC WITH DIFFERENTIAL - Abnormal; Notable for the following:    WBC 12.7 (*)    Neutrophils Relative % 83 (*)    Neutro Abs 10.5 (*)    All other components within normal limits  SEDIMENTATION RATE  BASIC METABOLIC PANEL    Imaging Review No results  found.   EKG Interpretation None      MDM   Final diagnoses:  None    Patient with headache. Had HSV-2 meningitis 10 months ago. Could be a septic recurrence. Discussed with infectious disease. No need for repeat lumbar puncture. Patient may benefit from 5 day course of Valcyclovir   Jasper Riling. Alvino Chapel, MD 08/20/14 [place] Alvino Chapel, MD 08/20/14 [phone]

## 2014-09-03 ENCOUNTER — Other Ambulatory Visit: Payer: Self-pay | Admitting: *Deleted

## 2014-09-03 MED ORDER — POTASSIUM CHLORIDE CRYS ER 20 MEQ PO TBCR: 20.0000 meq | EXTENDED_RELEASE_TABLET | Freq: Every day | ORAL | Status: DC

## 2014-09-03 MED ORDER — FUROSEMIDE 40 MG PO TABS: ORAL_TABLET | ORAL | Status: DC

## 2014-09-03 MED ORDER — PANTOPRAZOLE SODIUM 40 MG PO TBEC: 40.0000 mg | DELAYED_RELEASE_TABLET | Freq: Two times a day (BID) | ORAL | Status: DC

## 2014-09-03 NOTE — Telephone Encounter (Signed)
Received fax requesting refill on Protonix, Lasix, and potassium with 90 day supplies.   Refill appropriate and filled per protocol.

## 2014-11-09 ENCOUNTER — Encounter: Payer: Self-pay | Admitting: Family Medicine

## 2014-11-09 ENCOUNTER — Ambulatory Visit (INDEPENDENT_AMBULATORY_CARE_PROVIDER_SITE_OTHER): Payer: Medicare Other | Admitting: Family Medicine

## 2014-11-09 VITALS — BP 180/86 | HR 78 | Temp 98.8°F | Resp 18 | Ht 72.0 in | Wt 216.0 lb

## 2014-11-09 DIAGNOSIS — E038 Other specified hypothyroidism: Secondary | ICD-10-CM

## 2014-11-09 DIAGNOSIS — I10 Essential (primary) hypertension: Secondary | ICD-10-CM

## 2014-11-09 DIAGNOSIS — E785 Hyperlipidemia, unspecified: Secondary | ICD-10-CM

## 2014-11-09 DIAGNOSIS — B009 Herpesviral infection, unspecified: Secondary | ICD-10-CM

## 2014-11-09 MED ORDER — LOSARTAN POTASSIUM-HCTZ 50-12.5 MG PO TABS: 1.0000 | ORAL_TABLET | Freq: Every day | ORAL | Status: DC

## 2014-11-09 MED ORDER — VALACYCLOVIR HCL 500 MG PO TABS
500.0000 mg | ORAL_TABLET | Freq: Every day | ORAL | Status: DC
Start: 2014-11-09 — End: 2015-11-26

## 2014-11-09 NOTE — Progress Notes (Signed)
Subjective:    Patient ID: Tony Campos, male    DOB: 07-Nov-1966, 48 y.o.   MRN: [id number]  HPI Patient quit taking his blood pressure medication earlier this year. His blood pressure today is extremely high at 180/86. He has been checking at home and getting similar values. He denies any chest pain shortness of breath or dyspnea on exertion. He also discontinued his cholesterol medication approximate 6 months ago. He is overdue to recheck a fasting lipid panel. Patient is no longer taking testosterone. He quit this several months ago. He has not seen any side effects since stopping the testosterone other than increased erectile dysfunction. Patient was admitted for viral meningitis secondary to herpes in January. He was seen in the emergency room for meningitis secondary to herpes in September. He has had 3 herpes outbreaks in the last year. However each herpes outbreak tends to cause viral meningitis. Patient is not currently on any prophylactic medication. Past Medical History  Diagnosis Date  . Acid reflux   . Anxiety   . Depression   . Hypothyroidism   . Hypertension   . Hyperlipidemia   . Testosterone deficiency   . Spondylosis   . Genital warts   . Smoker   . Meningitis    Past Surgical History  Procedure Laterality Date  . Knee surgery      right  . Wrist surgery      right and left  . Cholecystectomy  2007  . Nasal septum surgery    . Nasal sinus surgery     Current Outpatient Prescriptions on File Prior to Visit  Medication Sig Dispense Refill  . buPROPion (WELLBUTRIN XL) 150 MG 24 hr tablet Take 450 mg by mouth daily.    . clonazePAM (KLONOPIN) 1 MG tablet Take 1 mg by mouth 2 (two) times daily as needed for anxiety.    Marland Kitchen doxepin (SINEQUAN) 75 MG capsule Take 75 mg by mouth daily.    . furosemide (LASIX) 40 MG tablet TAKE 1 TABLET BY MOUTH EVERY DAY 90 tablet 3  . lamoTRIgine (LAMICTAL) 200 MG tablet Take 200 mg by mouth daily.    Marland Kitchen levothyroxine (SYNTHROID,  LEVOTHROID) 125 MCG tablet Take 125 mcg by mouth daily before breakfast.    . morphine (MS CONTIN) 60 MG 12 hr tablet Take 60 mg by mouth 3 (three) times daily.    . pantoprazole (PROTONIX) 40 MG tablet Take 1 tablet (40 mg total) by mouth 2 (two) times daily. 180 tablet 3  . potassium chloride SA (K-DUR,KLOR-CON) 20 MEQ tablet Take 1 tablet (20 mEq total) by mouth daily. 90 tablet 3  . tiZANidine (ZANAFLEX) 4 MG tablet Take 2 tablets by mouth 3 (three) times daily.    . pravastatin (PRAVACHOL) 20 MG tablet Take 1 tablet (20 mg total) by mouth daily. (Patient not taking: Reported on 11/09/2014) 90 tablet 3  . testosterone cypionate (DEPOTESTOTERONE CYPIONATE) 200 MG/ML injection Inject 200 mg into the muscle every 14 (fourteen) days.    . [DISCONTINUED] esomeprazole (NEXIUM) 40 MG capsule Take 40 mg by mouth daily before breakfast.      . [DISCONTINUED] ibuprofen (ADVIL,MOTRIN) 800 MG tablet Take 800 mg by mouth every 8 (eight) hours as needed.    . [DISCONTINUED] potassium chloride (KLOR-CON) 10 MEQ CR tablet Take 10 mEq by mouth daily.       Current Facility-Administered Medications on File Prior to Visit  Medication Dose Route Frequency Provider Last Rate Last Dose  . testosterone  cypionate (DEPOTESTOTERONE CYPIONATE) injection 200 mg  200 mg Intramuscular Q14 Days Susy Frizzle, MD   200 mg at 07/21/13 0944   Allergies  Allergen Reactions  . Testosterone     Edema from topical applications   History   Social History  . Marital Status: Married    Spouse Name: N/A    Number of Children: N/A  . Years of Education: N/A   Occupational History  . [employer] at [employer] Part Time    Social History Main Topics  . Smoking status: Current Every Day Smoker -- 1.00 packs/day    Types: Cigarettes  . Smokeless tobacco: Not on file  . Alcohol Use: No  . Drug Use: No  . Sexual Activity: Not on file   Other Topics Concern  . Not on file   Social History Narrative       Review of Systems  All other systems reviewed and are negative.      Objective:   Physical Exam  Constitutional: He appears well-developed and well-nourished.  Neck: No thyromegaly present.  Cardiovascular: Normal rate, regular rhythm and normal heart sounds.   No murmur heard. Pulmonary/Chest: Effort normal and breath sounds normal. No respiratory distress. He has no wheezes. He has no rales.  Abdominal: Soft. Bowel sounds are normal. He exhibits no distension. There is no tenderness. There is no rebound and no guarding.  Musculoskeletal: He exhibits no edema.  Vitals reviewed.         Assessment & Plan:  Benign essential HTN - Plan: losartan-hydrochlorothiazide (HYZAAR) 50-12.5 MG per tablet, COMPLETE METABOLIC PANEL WITH GFR, Lipid panel, CBC with Differential  HSV (herpes simplex virus) infection - Plan: valACYclovir (VALTREX) 500 MG tablet  Other specified hypothyroidism - Plan: TSH  HLD (hyperlipidemia)  Patient's blood pressure is extremely high. I will start him on Hyzaar 50/12.5 one by mouth daily. Recheck blood pressure in 2 weeks. I have asked him to return fasting for a CMP, CBC, lipid panel. Goal LDL cholesterol is less than 130. I will start him on Valtrex 500 mg by mouth daily as a preventative for herpes. I will also check a TSH to monitor his hypothyroidism. When the patient was in the emergency room in September he had a leukocytosis, I will check a CBC to ensure that that has resolved.

## 2014-11-17 ENCOUNTER — Other Ambulatory Visit: Payer: Medicare Other

## 2014-11-17 DIAGNOSIS — E038 Other specified hypothyroidism: Secondary | ICD-10-CM

## 2014-11-17 DIAGNOSIS — I10 Essential (primary) hypertension: Secondary | ICD-10-CM

## 2014-11-17 LAB — COMPLETE METABOLIC PANEL WITH GFR
ALBUMIN: 4.1 g/dL (ref 3.5–5.2)
ALT: 9 U/L (ref 0–53)
AST: 13 U/L (ref 0–37)
Alkaline Phosphatase: 83 U/L (ref 39–117)
BUN: 12 mg/dL (ref 6–23)
CO2: 30 meq/L (ref 19–32)
Calcium: 9.6 mg/dL (ref 8.4–10.5)
Chloride: 103 mEq/L (ref 96–112)
Creat: 0.98 mg/dL (ref 0.50–1.35)
GLUCOSE: 84 mg/dL (ref 70–99)
POTASSIUM: 4.5 meq/L (ref 3.5–5.3)
SODIUM: 140 meq/L (ref 135–145)
Total Bilirubin: 0.5 mg/dL (ref 0.2–1.2)
Total Protein: 6 g/dL (ref 6.0–8.3)

## 2014-11-17 LAB — LIPID PANEL
Cholesterol: 221 mg/dL — ABNORMAL HIGH (ref 0–200)
HDL: 42 mg/dL (ref >39)
LDL Cholesterol: 151 mg/dL — ABNORMAL HIGH (ref 0–99)
Total CHOL/HDL Ratio: 5.3 Ratio
Triglycerides: 139 mg/dL (ref <150)
VLDL: 28 mg/dL (ref 0–40)

## 2014-11-17 LAB — CBC WITH DIFFERENTIAL/PLATELET
BASOS PCT: 1 % (ref 0–1)
Basophils Absolute: 0.1 10*3/uL (ref 0.0–0.1)
Eosinophils Absolute: 0.1 10*3/uL (ref 0.0–0.7)
Eosinophils Relative: 1 % (ref 0–5)
HEMATOCRIT: 41.3 % (ref 39.0–52.0)
HEMOGLOBIN: 14.1 g/dL (ref 13.0–17.0)
LYMPHS ABS: 5.5 10*3/uL — AB (ref 0.7–4.0)
Lymphocytes Relative: 46 % (ref 12–46)
MCH: 29.6 pg (ref 26.0–34.0)
MCHC: 34.1 g/dL (ref 30.0–36.0)
MCV: 86.8 fL (ref 78.0–100.0)
MONOS PCT: 8 % (ref 3–12)
MPV: 9.7 fL (ref 9.4–12.4)
Monocytes Absolute: 1 10*3/uL (ref 0.1–1.0)
NEUTROS PCT: 44 % (ref 43–77)
Neutro Abs: 5.3 10*3/uL (ref 1.7–7.7)
Platelets: 228 10*3/uL (ref 150–400)
RBC: 4.76 MIL/uL (ref 4.22–5.81)
RDW: 14.3 % (ref 11.5–15.5)
WBC: 12 10*3/uL — ABNORMAL HIGH (ref 4.0–10.5)

## 2014-11-18 LAB — TSH: TSH: 1.901 u[IU]/mL (ref 0.350–4.500)

## 2014-11-24 ENCOUNTER — Encounter: Payer: Self-pay | Admitting: Family Medicine

## 2015-01-13 ENCOUNTER — Other Ambulatory Visit: Payer: Self-pay | Admitting: Family Medicine

## 2015-01-13 MED ORDER — LEVOTHYROXINE SODIUM 125 MCG PO TABS: 125.0000 ug | ORAL_TABLET | Freq: Every day | ORAL | Status: DC

## 2015-01-13 NOTE — Telephone Encounter (Signed)
Med sent to pharm 

## 2015-01-15 ENCOUNTER — Telehealth: Payer: Self-pay | Admitting: Family Medicine

## 2015-01-15 MED ORDER — LEVOTHYROXINE SODIUM 125 MCG PO TABS: 125.0000 ug | ORAL_TABLET | Freq: Every day | ORAL | Status: DC

## 2015-01-15 NOTE — Telephone Encounter (Signed)
Medication refilled per protocol. 

## 2015-04-01 ENCOUNTER — Telehealth: Payer: Self-pay | Admitting: Family Medicine

## 2015-04-01 NOTE — Telephone Encounter (Signed)
PA Submitted through [email or web address]    DX: K21.0  Pt has tried Nexium, omeprazole and Protonix QD

## 2015-04-06 MED ORDER — PANTOPRAZOLE SODIUM 40 MG PO TBEC: 40.0000 mg | DELAYED_RELEASE_TABLET | Freq: Two times a day (BID) | ORAL | Status: DC

## 2015-04-06 NOTE — Telephone Encounter (Signed)
Approved through 03/31/16 - sent to pharm

## 2015-04-13 ENCOUNTER — Other Ambulatory Visit (HOSPITAL_COMMUNITY): Payer: Self-pay | Admitting: Physical Medicine and Rehabilitation

## 2015-04-13 DIAGNOSIS — M5440 Lumbago with sciatica, unspecified side: Secondary | ICD-10-CM

## 2015-04-16 ENCOUNTER — Ambulatory Visit (HOSPITAL_COMMUNITY)
Admission: RE | Admit: 2015-04-16 | Discharge: 2015-04-16 | Disposition: A | Discharge status: Home / Self Care | Payer: Medicare Other | Source: Ambulatory Visit | Attending: Physical Medicine and Rehabilitation | Admitting: Physical Medicine and Rehabilitation

## 2015-04-16 DIAGNOSIS — M5440 Lumbago with sciatica, unspecified side: Secondary | ICD-10-CM

## 2015-04-16 DIAGNOSIS — R531 Weakness: Secondary | ICD-10-CM | POA: Diagnosis not present

## 2015-04-16 DIAGNOSIS — M545 Low back pain: Secondary | ICD-10-CM | POA: Insufficient documentation

## 2015-04-16 DIAGNOSIS — G8929 Other chronic pain: Secondary | ICD-10-CM | POA: Insufficient documentation

## 2015-04-22 ENCOUNTER — Ambulatory Visit (HOSPITAL_COMMUNITY): Payer: Medicare Other

## 2015-05-12 ENCOUNTER — Other Ambulatory Visit: Payer: Medicare Other

## 2015-05-12 DIAGNOSIS — F329 Major depressive disorder, single episode, unspecified: Secondary | ICD-10-CM

## 2015-05-12 DIAGNOSIS — E785 Hyperlipidemia, unspecified: Secondary | ICD-10-CM

## 2015-05-12 DIAGNOSIS — E038 Other specified hypothyroidism: Secondary | ICD-10-CM

## 2015-05-12 DIAGNOSIS — Z79899 Other long term (current) drug therapy: Secondary | ICD-10-CM

## 2015-05-12 DIAGNOSIS — F411 Generalized anxiety disorder: Secondary | ICD-10-CM

## 2015-05-12 DIAGNOSIS — F32A Depression, unspecified: Secondary | ICD-10-CM

## 2015-05-12 DIAGNOSIS — I10 Essential (primary) hypertension: Secondary | ICD-10-CM

## 2015-05-12 LAB — CBC WITH DIFFERENTIAL/PLATELET
BASOS PCT: 0 % (ref 0–1)
Basophils Absolute: 0 10*3/uL (ref 0.0–0.1)
Eosinophils Absolute: 0.1 10*3/uL (ref 0.0–0.7)
Eosinophils Relative: 1 % (ref 0–5)
HCT: 39 % (ref 39.0–52.0)
Hemoglobin: 13.5 g/dL (ref 13.0–17.0)
Lymphocytes Relative: 32 % (ref 12–46)
Lymphs Abs: 3.2 10*3/uL (ref 0.7–4.0)
MCH: 30.8 pg (ref 26.0–34.0)
MCHC: 34.6 g/dL (ref 30.0–36.0)
MCV: 89 fL (ref 78.0–100.0)
MPV: 9.7 fL (ref 8.6–12.4)
Monocytes Absolute: 0.8 10*3/uL (ref 0.1–1.0)
Monocytes Relative: 8 % (ref 3–12)
NEUTROS ABS: 5.8 10*3/uL (ref 1.7–7.7)
NEUTROS PCT: 59 % (ref 43–77)
PLATELETS: 228 10*3/uL (ref 150–400)
RBC: 4.38 MIL/uL (ref 4.22–5.81)
RDW: 13.7 % (ref 11.5–15.5)
WBC: 9.9 10*3/uL (ref 4.0–10.5)

## 2015-05-12 LAB — LIPID PANEL
CHOLESTEROL: 238 mg/dL — AB (ref 0–200)
HDL: 39 mg/dL — ABNORMAL LOW (ref >=40)
LDL Cholesterol: 156 mg/dL — ABNORMAL HIGH (ref 0–99)
Total CHOL/HDL Ratio: 6.1 Ratio
Triglycerides: 217 mg/dL — ABNORMAL HIGH (ref <150)
VLDL: 43 mg/dL — ABNORMAL HIGH (ref 0–40)

## 2015-05-12 LAB — COMPLETE METABOLIC PANEL WITH GFR
ALK PHOS: 73 U/L (ref 39–117)
ALT: 9 U/L (ref 0–53)
AST: 17 U/L (ref 0–37)
Albumin: 3.9 g/dL (ref 3.5–5.2)
BUN: 13 mg/dL (ref 6–23)
CO2: 31 mEq/L (ref 19–32)
CREATININE: 0.9 mg/dL (ref 0.50–1.35)
Calcium: 9.4 mg/dL (ref 8.4–10.5)
Chloride: 103 mEq/L (ref 96–112)
GFR, Est African American: >89 mL/min
GFR, Est Non African American: >89 mL/min
Glucose, Bld: 92 mg/dL (ref 70–99)
Potassium: 4.9 mEq/L (ref 3.5–5.3)
SODIUM: 140 meq/L (ref 135–145)
TOTAL PROTEIN: 5.8 g/dL — AB (ref 6.0–8.3)
Total Bilirubin: 0.4 mg/dL (ref 0.2–1.2)

## 2015-05-12 LAB — TSH: TSH: 0.484 u[IU]/mL (ref 0.350–4.500)

## 2015-06-04 DIAGNOSIS — G5701 Lesion of sciatic nerve, right lower limb: Secondary | ICD-10-CM | POA: Insufficient documentation

## 2015-06-23 ENCOUNTER — Other Ambulatory Visit: Payer: Self-pay | Admitting: Family Medicine

## 2015-06-23 ENCOUNTER — Encounter: Payer: Self-pay | Admitting: Family Medicine

## 2015-06-23 MED ORDER — ATORVASTATIN CALCIUM 40 MG PO TABS: 40.0000 mg | ORAL_TABLET | Freq: Every day | ORAL | Status: DC

## 2015-07-07 ENCOUNTER — Other Ambulatory Visit: Payer: Self-pay | Admitting: Family Medicine

## 2015-07-07 NOTE — Telephone Encounter (Signed)
Medication refilled per protocol. 

## 2015-07-21 ENCOUNTER — Other Ambulatory Visit (HOSPITAL_COMMUNITY): Payer: Self-pay | Admitting: Physical Medicine and Rehabilitation

## 2015-07-21 DIAGNOSIS — R51 Headache: Principal | ICD-10-CM

## 2015-07-21 DIAGNOSIS — R519 Headache, unspecified: Secondary | ICD-10-CM

## 2015-08-03 ENCOUNTER — Ambulatory Visit (HOSPITAL_COMMUNITY)
Admission: RE | Admit: 2015-08-03 | Discharge: 2015-08-03 | Disposition: A | Discharge status: Home / Self Care | Payer: Medicare Other | Source: Ambulatory Visit | Attending: Physical Medicine and Rehabilitation | Admitting: Physical Medicine and Rehabilitation

## 2015-08-03 DIAGNOSIS — R519 Headache, unspecified: Secondary | ICD-10-CM

## 2015-08-03 DIAGNOSIS — R51 Headache: Secondary | ICD-10-CM | POA: Diagnosis not present

## 2015-08-09 DIAGNOSIS — I739 Peripheral vascular disease, unspecified: Secondary | ICD-10-CM | POA: Insufficient documentation

## 2015-08-17 ENCOUNTER — Telehealth: Payer: Self-pay | Admitting: Family Medicine

## 2015-08-17 NOTE — Telephone Encounter (Signed)
[phone] [facility] [place]  Patient calling to see if you can send in chantix rx for him

## 2015-08-18 NOTE — Telephone Encounter (Signed)
Ok to send in this RX

## 2015-08-19 MED ORDER — VARENICLINE TARTRATE 0.5 MG X 11 & 1 MG X 42 PO MISC: ORAL | Status: DC

## 2015-08-19 NOTE — Telephone Encounter (Signed)
Medication called/sent to requested pharmacy and pt aware 

## 2015-08-19 NOTE — Telephone Encounter (Signed)
He can try a starter pack.

## 2015-08-23 ENCOUNTER — Encounter: Payer: Self-pay | Admitting: Family Medicine

## 2015-09-21 ENCOUNTER — Ambulatory Visit (INDEPENDENT_AMBULATORY_CARE_PROVIDER_SITE_OTHER): Payer: Medicare Other | Admitting: Family Medicine

## 2015-09-21 ENCOUNTER — Encounter: Payer: Self-pay | Admitting: Family Medicine

## 2015-09-21 VITALS — BP 128/72 | HR 84 | Temp 99.5°F | Resp 18 | Ht 72.0 in | Wt 248.0 lb

## 2015-09-21 DIAGNOSIS — T300 Burn of unspecified body region, unspecified degree: Secondary | ICD-10-CM | POA: Diagnosis not present

## 2015-09-21 DIAGNOSIS — IMO0002 Reserved for concepts with insufficient information to code with codable children: Secondary | ICD-10-CM

## 2015-09-21 MED ORDER — SILVER SULFADIAZINE 1 % EX CREA: 1.0000 "application " | TOPICAL_CREAM | Freq: Every day | CUTANEOUS | Status: DC
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
## 2015-09-24 DIAGNOSIS — I70219 Atherosclerosis of native arteries of extremities with intermittent claudication, unspecified extremity: Secondary | ICD-10-CM | POA: Insufficient documentation

## 2015-09-27 ENCOUNTER — Other Ambulatory Visit: Payer: Self-pay | Admitting: Family Medicine

## 2015-09-27 DIAGNOSIS — IMO0002 Reserved for concepts with insufficient information to code with codable children: Secondary | ICD-10-CM

## 2015-09-27 MED ORDER — SILVER SULFADIAZINE 1 % EX CREA: 1.0000 "application " | TOPICAL_CREAM | Freq: Every day | CUTANEOUS | Status: DC

## 2015-09-29 ENCOUNTER — Other Ambulatory Visit: Payer: Self-pay | Admitting: Family Medicine

## 2015-09-29 NOTE — Telephone Encounter (Signed)
Refill appropriate and filled per protocol. 

## 2015-09-30 DIAGNOSIS — G47419 Narcolepsy without cataplexy: Secondary | ICD-10-CM | POA: Insufficient documentation

## 2015-09-30 DIAGNOSIS — F3181 Bipolar II disorder: Secondary | ICD-10-CM | POA: Insufficient documentation

## 2015-09-30 DIAGNOSIS — T300 Burn of unspecified body region, unspecified degree: Secondary | ICD-10-CM | POA: Insufficient documentation

## 2015-09-30 DIAGNOSIS — G8929 Other chronic pain: Secondary | ICD-10-CM | POA: Insufficient documentation

## 2015-10-11 ENCOUNTER — Encounter: Payer: Self-pay | Admitting: Family Medicine

## 2015-10-11 ENCOUNTER — Ambulatory Visit (INDEPENDENT_AMBULATORY_CARE_PROVIDER_SITE_OTHER): Payer: Medicare Other | Admitting: Family Medicine

## 2015-10-11 ENCOUNTER — Other Ambulatory Visit: Payer: Self-pay | Admitting: Family Medicine

## 2015-10-11 VITALS — BP 138/72 | HR 78 | Temp 98.0°F | Resp 14 | Ht 72.0 in | Wt 246.0 lb

## 2015-10-11 DIAGNOSIS — R609 Edema, unspecified: Secondary | ICD-10-CM | POA: Diagnosis not present

## 2015-10-11 DIAGNOSIS — T2130XA Burn of third degree of trunk, unspecified site, initial encounter: Secondary | ICD-10-CM | POA: Insufficient documentation

## 2015-10-11 DIAGNOSIS — T2130XD Burn of third degree of trunk, unspecified site, subsequent encounter: Secondary | ICD-10-CM | POA: Diagnosis not present

## 2015-10-11 DIAGNOSIS — E039 Hypothyroidism, unspecified: Secondary | ICD-10-CM

## 2015-10-11 MED ORDER — SULFAMETHOXAZOLE-TRIMETHOPRIM 800-160 MG PO TABS: 1.0000 | ORAL_TABLET | Freq: Two times a day (BID) | ORAL | Status: DC

## 2015-10-11 MED ORDER — FUROSEMIDE 40 MG PO TABS: ORAL_TABLET | ORAL | Status: DC

## 2015-10-11 MED ORDER — POTASSIUM CHLORIDE CRYS ER 10 MEQ PO TBCR: 10.0000 meq | EXTENDED_RELEASE_TABLET | Freq: Two times a day (BID) | ORAL | Status: DC | PRN

## 2015-10-11 NOTE — Assessment & Plan Note (Signed)
I think he is getting some infection setting in though not severe. On that up with him on Bactrim twice a day. He will continue cleaning the wound and bandage changes. In general think it is healing quite well with the exception of those 2 tiny areas and that one quarter size area. I did use a Q-tip to do a little debridement I do not see any necrotic tissue and he has some good vascular supply to the area.

## 2015-10-11 NOTE — Assessment & Plan Note (Signed)
I'll put him on Lasix 60 mg twice a day with the addition of potassium he will do this for 3 days and then decrease his Lasix to 40 mg daily then stop.. I will check his renal function as well as liver function. I'm also to check his thyroid labs to make sure this is not a cause of some of the fluid retention. He has been worked up by vascular and per him there is no other cause of his fluid retention. It is often intermittent
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
## 2015-10-11 NOTE — Patient Instructions (Addendum)
Lasix 60mg  BID for 3 days, then 40 daily x 3 days then stop  Take potassium with lasix Bactrim antibiotics F/U Monday with Dr. Dennard Schaumann

## 2015-10-12 ENCOUNTER — Ambulatory Visit: Payer: Medicare Other | Admitting: Family Medicine

## 2015-10-12 ENCOUNTER — Telehealth: Payer: Self-pay | Admitting: *Deleted

## 2015-10-12 LAB — TSH: TSH: 1.665 u[IU]/mL (ref 0.350–4.500)

## 2015-10-12 LAB — COMPREHENSIVE METABOLIC PANEL
ALT: 9 U/L (ref 9–46)
AST: 22 U/L (ref 10–40)
Albumin: 3.9 g/dL (ref 3.6–5.1)
Alkaline Phosphatase: 72 U/L (ref 40–115)
BUN: 8 mg/dL (ref 7–25)
CALCIUM: 9.1 mg/dL (ref 8.6–10.3)
CHLORIDE: 96 mmol/L — AB (ref 98–110)
CO2: 34 mmol/L — AB (ref 20–31)
Creat: 0.94 mg/dL (ref 0.60–1.35)
GLUCOSE: 113 mg/dL — AB (ref 70–99)
POTASSIUM: 3.4 mmol/L — AB (ref 3.5–5.3)
Sodium: 138 mmol/L (ref 135–146)
Total Bilirubin: 0.4 mg/dL (ref 0.2–1.2)
Total Protein: 5.8 g/dL — ABNORMAL LOW (ref 6.1–8.1)

## 2015-10-12 LAB — CBC WITH DIFFERENTIAL/PLATELET
BASOS ABS: 0 10*3/uL (ref 0.0–0.1)
Basophils Relative: 0 % (ref 0–1)
EOS ABS: 0.1 10*3/uL (ref 0.0–0.7)
EOS PCT: 1 % (ref 0–5)
HEMATOCRIT: 34.6 % — AB (ref 39.0–52.0)
Hemoglobin: 11.9 g/dL — ABNORMAL LOW (ref 13.0–17.0)
LYMPHS ABS: 4.2 10*3/uL — AB (ref 0.7–4.0)
LYMPHS PCT: 34 % (ref 12–46)
MCH: 30.2 pg (ref 26.0–34.0)
MCHC: 34.4 g/dL (ref 30.0–36.0)
MCV: 87.8 fL (ref 78.0–100.0)
MONO ABS: 1.1 10*3/uL — AB (ref 0.1–1.0)
MPV: 9.9 fL (ref 8.6–12.4)
Monocytes Relative: 9 % (ref 3–12)
Neutro Abs: 6.9 10*3/uL (ref 1.7–7.7)
Neutrophils Relative %: 56 % (ref 43–77)
PLATELETS: 249 10*3/uL (ref 150–400)
RBC: 3.94 MIL/uL — ABNORMAL LOW (ref 4.22–5.81)
RDW: 13.8 % (ref 11.5–15.5)
WBC: 12.4 10*3/uL — AB (ref 4.0–10.5)

## 2015-10-12 LAB — T3, FREE: T3, Free: 2.3 pg/mL (ref 2.3–4.2)

## 2015-10-12 LAB — T4, FREE: FREE T4: 1.21 ng/dL (ref 0.80–1.80)

## 2015-10-12 NOTE — Telephone Encounter (Signed)
Okay to add- Dx- Low testosterone

## 2015-10-12 NOTE — Telephone Encounter (Signed)
To Lab

## 2015-10-12 NOTE — Telephone Encounter (Signed)
Pt called wanting to know if you could add testosterone level to his blood work from yesterday. Stated he forgot to ask when was here yesterday.

## 2015-10-13 LAB — TESTOSTERONE, FREE, TOTAL, SHBG
SEX HORMONE BINDING: 36 nmol/L (ref 10–50)
TESTOSTERONE FREE: 24.2 pg/mL — AB (ref 47.0–244.0)
TESTOSTERONE-% FREE: 1.8 % (ref 1.6–2.9)
TESTOSTERONE: 137 ng/dL — AB (ref 300–890)

## 2015-10-18 ENCOUNTER — Ambulatory Visit (INDEPENDENT_AMBULATORY_CARE_PROVIDER_SITE_OTHER): Payer: Medicare Other | Admitting: Family Medicine

## 2015-10-18 ENCOUNTER — Other Ambulatory Visit: Payer: Self-pay | Admitting: Family Medicine

## 2015-10-18 ENCOUNTER — Encounter: Payer: Self-pay | Admitting: Family Medicine

## 2015-10-18 VITALS — BP 130/80 | HR 88 | Temp 98.0°F | Resp 18 | Ht 72.0 in | Wt 245.0 lb

## 2015-10-18 DIAGNOSIS — T2130XD Burn of third degree of trunk, unspecified site, subsequent encounter: Secondary | ICD-10-CM

## 2015-10-18 DIAGNOSIS — G4710 Hypersomnia, unspecified: Secondary | ICD-10-CM

## 2015-10-18 DIAGNOSIS — E291 Testicular hypofunction: Secondary | ICD-10-CM | POA: Diagnosis not present

## 2015-10-18 DIAGNOSIS — R5383 Other fatigue: Secondary | ICD-10-CM | POA: Diagnosis not present

## 2015-10-18 LAB — CBC WITH DIFFERENTIAL/PLATELET
Basophils Absolute: 0.1 10*3/uL (ref 0.0–0.1)
Basophils Relative: 1 % (ref 0–1)
EOS ABS: 0.2 10*3/uL (ref 0.0–0.7)
EOS PCT: 2 % (ref 0–5)
HEMATOCRIT: 36.7 % — AB (ref 39.0–52.0)
Hemoglobin: 12.3 g/dL — ABNORMAL LOW (ref 13.0–17.0)
LYMPHS ABS: 3.2 10*3/uL (ref 0.7–4.0)
Lymphocytes Relative: 38 % (ref 12–46)
MCH: 30.2 pg (ref 26.0–34.0)
MCHC: 33.5 g/dL (ref 30.0–36.0)
MCV: 90.2 fL (ref 78.0–100.0)
MONO ABS: 0.8 10*3/uL (ref 0.1–1.0)
MONOS PCT: 10 % (ref 3–12)
MPV: 9.6 fL (ref 8.6–12.4)
Neutro Abs: 4.1 10*3/uL (ref 1.7–7.7)
Neutrophils Relative %: 49 % (ref 43–77)
PLATELETS: 231 10*3/uL (ref 150–400)
RBC: 4.07 MIL/uL — ABNORMAL LOW (ref 4.22–5.81)
RDW: 13.7 % (ref 11.5–15.5)
WBC: 8.4 10*3/uL (ref 4.0–10.5)
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
## 2015-10-19 LAB — HEMOGLOBIN A1C
HEMOGLOBIN A1C: 5.4 % (ref <5.7)
Mean Plasma Glucose: 108 mg/dL (ref <117)

## 2015-10-20 ENCOUNTER — Ambulatory Visit: Payer: Medicare Other | Admitting: Family Medicine

## 2015-11-01 ENCOUNTER — Other Ambulatory Visit: Payer: Self-pay | Admitting: Family Medicine

## 2015-11-02 ENCOUNTER — Ambulatory Visit (INDEPENDENT_AMBULATORY_CARE_PROVIDER_SITE_OTHER): Payer: Medicare Other | Admitting: Family Medicine

## 2015-11-02 ENCOUNTER — Encounter: Payer: Self-pay | Admitting: Family Medicine

## 2015-11-02 VITALS — BP 96/60 | HR 92 | Temp 99.1°F | Resp 18 | Ht 72.0 in | Wt 234.0 lb

## 2015-11-02 DIAGNOSIS — J208 Acute bronchitis due to other specified organisms: Secondary | ICD-10-CM

## 2015-11-02 MED ORDER — AZITHROMYCIN 250 MG PO TABS: ORAL_TABLET | ORAL | Status: DC

## 2015-11-02 NOTE — Progress Notes (Signed)
Subjective:    Patient ID: Tony Campos, male    DOB: [birth year], 49 y.o.   MRN: [id number]  HPI  Patient is a 49 year old smoker who presents with 4-5 days of chest congestion, worsening cough, increasing shortness of breath and low-grade fevers. The cough is productive of yellow and green sputum. He reports pleurisy. He reports severe pressure-like headaches with coughing. He is coughing so hard at times he feels like he may pass out. He also denies any sinus headache, rhinorrhea, or sore throat Past Medical History  Diagnosis Date  . Acid reflux   . Anxiety   . Depression   . Hypothyroidism   . Hypertension   . Hyperlipidemia   . Testosterone deficiency   . Spondylosis   . Genital warts   . Smoker   . Meningitis   . PAD (peripheral artery disease) ([place])   . Smoker    Past Surgical History  Procedure Laterality Date  . Knee surgery      right  . Wrist surgery      right and left  . Cholecystectomy  2007  . Nasal septum surgery    . Nasal sinus surgery     Current Outpatient Prescriptions on File Prior to Visit  Medication Sig Dispense Refill  . baclofen (LIORESAL) 20 MG tablet Take 20 mg by mouth 3 (three) times daily.    Marland Kitchen buPROPion (WELLBUTRIN XL) 150 MG 24 hr tablet Take 450 mg by mouth daily.    . cilostazol (PLETAL) 100 MG tablet Take 100 mg by mouth 2 (two) times daily.    . clonazePAM (KLONOPIN) 1 MG tablet Take 1 mg by mouth 2 (two) times daily as needed for anxiety.    . fentaNYL (DURAGESIC - DOSED MCG/HR) 75 MCG/HR Place 75 mcg onto the skin every 3 (three) days.    . furosemide (LASIX) 40 MG tablet Take 40mg  BID prn 60 tablet 6  . lamoTRIgine (LAMICTAL) 200 MG tablet Take 200 mg by mouth daily.    Marland Kitchen levothyroxine (SYNTHROID, LEVOTHROID) 125 MCG tablet TAKE 1 TABLET BY MOUTH EVERY DAY BEFORE BREAKFAST 90 tablet 3  . losartan-hydrochlorothiazide (HYZAAR) 50-12.5 MG tablet TAKE 1 TABLET BY MOUTH EVERY DAY 90 tablet 3  . morphine (MS CONTIN) 60 MG 12 hr  tablet Take 60 mg by mouth 3 (three) times daily.    Marland Kitchen morphine (MSIR) 15 MG tablet Take 15 mg by mouth every 4 (four) hours as needed for severe pain.    . pantoprazole (PROTONIX) 40 MG tablet Take 1 tablet (40 mg total) by mouth 2 (two) times daily. 180 tablet 3  . potassium chloride SA (K-DUR,KLOR-CON) 10 MEQ tablet Take 1 tablet (10 mEq total) by mouth 2 (two) times daily as needed. With lasix 60 tablet 2  . silver sulfADIAZINE (SILVADENE) 1 % cream Apply 1 application topically daily. 400 g 2  . simvastatin (ZOCOR) 40 MG tablet Take 40 mg by mouth daily.    Marland Kitchen sulfamethoxazole-trimethoprim (BACTRIM DS,SEPTRA DS) 800-160 MG tablet Take 1 tablet by mouth 2 (two) times daily. 14 tablet 0  . valACYclovir (VALTREX) 500 MG tablet Take 1 tablet (500 mg total) by mouth daily. 30 tablet 11  . [DISCONTINUED] esomeprazole (NEXIUM) 40 MG capsule Take 40 mg by mouth daily before breakfast.      . [DISCONTINUED] ibuprofen (ADVIL,MOTRIN) 800 MG tablet Take 800 mg by mouth every 8 (eight) hours as needed.    . [DISCONTINUED] potassium chloride (KLOR-CON) 10 MEQ CR tablet  Take 10 mEq by mouth daily.       No current facility-administered medications on file prior to visit.   Allergies  Allergen Reactions  . Testosterone     Edema from topical applications   Social History   Social History  . Marital Status: Married    Spouse Name: N/A  . Number of Children: N/A  . Years of Education: N/A   Occupational History  . [employer] Part Time    Social History Main Topics  . Smoking status: Current Every Day Smoker -- 1.00 packs/day    Types: Cigarettes  . Smokeless tobacco: Not on file  . Alcohol Use: No  . Drug Use: No  . Sexual Activity: Not on file   Other Topics Concern  . Not on file   Social History Narrative     Review of Systems  All other systems reviewed and are negative.      Objective:   Physical Exam  Constitutional: He appears well-developed and  well-nourished. No distress.  HENT:  Right Ear: External ear normal.  Left Ear: External ear normal.  Nose: Nose normal.  Mouth/Throat: Oropharynx is clear and moist. No oropharyngeal exudate.  Eyes: Conjunctivae are normal. Pupils are equal, round, and reactive to light.  Neck: Neck supple.  Cardiovascular: Normal rate, regular rhythm and normal heart sounds.   No murmur heard. Pulmonary/Chest: Effort normal. He has wheezes. He has no rales. He exhibits no tenderness.  Abdominal: Soft. Bowel sounds are normal.  Lymphadenopathy:    He has no cervical adenopathy.  Skin: He is not diaphoretic.  Vitals reviewed.         Assessment & Plan:  Acute bronchitis due to other specified organisms - Plan: azithromycin (ZITHROMAX) 250 MG tablet  I will treat the patient has bronchitis with a Z-Pak. I recommended Mucinex DM as needed for coughing. Recheck in one week if no better or sooner if worse

## 2015-11-15 ENCOUNTER — Institutional Professional Consult (permissible substitution): Payer: Medicare Other | Admitting: Neurology

## 2015-11-15 ENCOUNTER — Telehealth: Payer: Self-pay

## 2015-11-15 NOTE — Telephone Encounter (Signed)
Pt did not show for their appt with Dr. Dohmeier today.  

## 2015-11-16 ENCOUNTER — Encounter: Payer: Self-pay | Admitting: Neurology

## 2015-11-25 ENCOUNTER — Encounter: Payer: Self-pay | Admitting: Family Medicine

## 2015-11-25 ENCOUNTER — Ambulatory Visit (INDEPENDENT_AMBULATORY_CARE_PROVIDER_SITE_OTHER): Payer: Medicare Other | Admitting: Family Medicine

## 2015-11-25 VITALS — BP 152/70 | HR 94 | Temp 98.8°F | Resp 20 | Ht 72.0 in | Wt 244.0 lb

## 2015-11-25 DIAGNOSIS — R5382 Chronic fatigue, unspecified: Secondary | ICD-10-CM | POA: Diagnosis not present

## 2015-11-25 NOTE — Progress Notes (Signed)
Subjective:    Patient ID: Tony Campos, male    DOB: Jun 02, 1966, 49 y.o.   MRN: [id number]  HPI  11/02/15 Patient is a 49 year old smoker who presents with 4-5 days of chest congestion, worsening cough, increasing shortness of breath and low-grade fevers. The cough is productive of yellow and green sputum. He reports pleurisy. He reports severe pressure-like headaches with coughing. He is coughing so hard at times he feels like he may pass out. He also denies any sinus headache, rhinorrhea, or sore throat.  At that time, my plan was: I will treat the patient has bronchitis with a Z-Pak. I recommended Mucinex DM as needed for coughing. Recheck in one week if no better or sooner if worse  11/25/15 Patient states that he still feels tired. He is hypersomnolent. He sleeps 7 hours every night when he wakes up he still feels tired. I have scheduled him to me with the neurologist for a sleep study however he missed the first appointment because he overslept. He denies any chest pain or shortness of breath or dyspnea on exertion. He does feel achy all over although that has subsided. His thyroid was recently checked and found to be normal. His testosterone is low but I recommended not treating that because of his known cardiovascular disease. He was found to be mildly anemic and I have not checked the patient for low B12 Past Medical History  Diagnosis Date  . Acid reflux   . Anxiety   . Depression   . Hypothyroidism   . Hypertension   . Hyperlipidemia   . Testosterone deficiency   . Spondylosis   . Genital warts   . Smoker   . Meningitis   . PAD (peripheral artery disease) ([place])   . Smoker    Past Surgical History  Procedure Laterality Date  . Knee surgery      right  . Wrist surgery      right and left  . Cholecystectomy  2007  . Nasal septum surgery    . Nasal sinus surgery     Current Outpatient Prescriptions on File Prior to Visit  Medication Sig Dispense Refill  .  azithromycin (ZITHROMAX) 250 MG tablet 2 tabs poqday1, 1 tab poqday 2-5 6 tablet 0  . baclofen (LIORESAL) 20 MG tablet Take 20 mg by mouth 3 (three) times daily.    Marland Kitchen buPROPion (WELLBUTRIN XL) 150 MG 24 hr tablet Take 450 mg by mouth daily.    . cilostazol (PLETAL) 100 MG tablet Take 100 mg by mouth 2 (two) times daily.    . clonazePAM (KLONOPIN) 1 MG tablet Take 1 mg by mouth 2 (two) times daily as needed for anxiety.    . fentaNYL (DURAGESIC - DOSED MCG/HR) 75 MCG/HR Place 75 mcg onto the skin every 3 (three) days.    . furosemide (LASIX) 40 MG tablet Take 40mg  BID prn 60 tablet 6  . lamoTRIgine (LAMICTAL) 200 MG tablet Take 200 mg by mouth daily.    Marland Kitchen levothyroxine (SYNTHROID, LEVOTHROID) 125 MCG tablet TAKE 1 TABLET BY MOUTH EVERY DAY BEFORE BREAKFAST 90 tablet 3  . losartan-hydrochlorothiazide (HYZAAR) 50-12.5 MG tablet TAKE 1 TABLET BY MOUTH EVERY DAY 90 tablet 3  . morphine (MS CONTIN) 60 MG 12 hr tablet Take 60 mg by mouth 3 (three) times daily.    Marland Kitchen morphine (MSIR) 15 MG tablet Take 15 mg by mouth every 4 (four) hours as needed for severe pain.    . pantoprazole (PROTONIX)  40 MG tablet Take 1 tablet (40 mg total) by mouth 2 (two) times daily. 180 tablet 3  . potassium chloride SA (K-DUR,KLOR-CON) 10 MEQ tablet Take 1 tablet (10 mEq total) by mouth 2 (two) times daily as needed. With lasix 60 tablet 2  . silver sulfADIAZINE (SILVADENE) 1 % cream Apply 1 application topically daily. 400 g 2  . simvastatin (ZOCOR) 40 MG tablet Take 40 mg by mouth daily.    Marland Kitchen sulfamethoxazole-trimethoprim (BACTRIM DS,SEPTRA DS) 800-160 MG tablet Take 1 tablet by mouth 2 (two) times daily. 14 tablet 0  . valACYclovir (VALTREX) 500 MG tablet Take 1 tablet (500 mg total) by mouth daily. 30 tablet 11  . [DISCONTINUED] esomeprazole (NEXIUM) 40 MG capsule Take 40 mg by mouth daily before breakfast.      . [DISCONTINUED] ibuprofen (ADVIL,MOTRIN) 800 MG tablet Take 800 mg by mouth every 8 (eight) hours as needed.      . [DISCONTINUED] potassium chloride (KLOR-CON) 10 MEQ CR tablet Take 10 mEq by mouth daily.       No current facility-administered medications on file prior to visit.   Allergies  Allergen Reactions  . Testosterone     Edema from topical applications   Social History   Social History  . Marital Status: Married    Spouse Name: N/A  . Number of Children: N/A  . Years of Education: N/A   Occupational History  . [employer] Part Time    Social History Main Topics  . Smoking status: Current Every Day Smoker -- 1.00 packs/day    Types: Cigarettes  . Smokeless tobacco: Not on file  . Alcohol Use: No  . Drug Use: No  . Sexual Activity: Not on file   Other Topics Concern  . Not on file   Social History Narrative     Review of Systems  All other systems reviewed and are negative.      Objective:   Physical Exam  Constitutional: He appears well-developed and well-nourished. No distress.  HENT:  Right Ear: External ear normal.  Left Ear: External ear normal.  Nose: Nose normal.  Mouth/Throat: Oropharynx is clear and moist. No oropharyngeal exudate.  Eyes: Conjunctivae are normal. Pupils are equal, round, and reactive to light.  Neck: Neck supple.  Cardiovascular: Normal rate, regular rhythm and normal heart sounds.   No murmur heard. Pulmonary/Chest: Effort normal. He has wheezes. He has no rales. He exhibits no tenderness.  Abdominal: Soft. Bowel sounds are normal.  Lymphadenopathy:    He has no cervical adenopathy.  Skin: He is not diaphoretic.  Vitals reviewed.         Assessment & Plan:  Chronic fatigue - Plan: Vitamin B12  I believe the majority of his fatigue and hypersomnolence is secondary to sleep apnea. I recommended to the patient that we await the results of the sleep study as I believe this will likely demonstrate the cause of his fatigue and also provide as a way to treat it. Given his anemia I will check a B12 level. His  thyroid has been checked and found to be normal. His testosterone is low but given his known peripheral artery disease and the risk of cardiovascular disease and testosterone I recommended against treating this. I was also straight for the patient and explained that I believe a lot of his fatigue and hypersomnolence could be due to polypharmacy. He is currently taking morphine, fentanyl, Klonopin, and baclofen for chronic pain and I believe that  these could be contributing to some of his hypersomnolence and fatigue. I recommended that he discuss this with his pain doctor and try to wean back gradually on these as tolerated

## 2015-11-26 ENCOUNTER — Other Ambulatory Visit: Payer: Self-pay | Admitting: Family Medicine

## 2015-11-26 DIAGNOSIS — B009 Herpesviral infection, unspecified: Secondary | ICD-10-CM

## 2015-11-26 LAB — VITAMIN B12: VITAMIN B 12: 1550 pg/mL — AB (ref 211–911)

## 2015-11-26 MED ORDER — VALACYCLOVIR HCL 500 MG PO TABS: 500.0000 mg | ORAL_TABLET | Freq: Every day | ORAL | Status: DC

## 2015-11-26 MED ORDER — VALACYCLOVIR HCL 500 MG PO TABS
500.0000 mg | ORAL_TABLET | Freq: Every day | ORAL | Status: DC
Start: 2015-11-26 — End: 2015-11-26

## 2015-11-26 NOTE — Telephone Encounter (Signed)
Medication refilled per protocol. 

## 2015-11-26 NOTE — Addendum Note (Signed)
Addended by: Shary Decamp B on: 11/26/2015 05:11 PM   Modules accepted: Orders

## 2015-12-06 ENCOUNTER — Institutional Professional Consult (permissible substitution): Payer: Medicare Other | Admitting: Neurology

## 2015-12-09 ENCOUNTER — Encounter: Payer: Self-pay | Admitting: Neurology

## 2015-12-09 ENCOUNTER — Ambulatory Visit (INDEPENDENT_AMBULATORY_CARE_PROVIDER_SITE_OTHER): Payer: Medicare Other | Admitting: Neurology

## 2015-12-09 ENCOUNTER — Encounter (INDEPENDENT_AMBULATORY_CARE_PROVIDER_SITE_OTHER): Payer: Self-pay

## 2015-12-09 VITALS — BP 158/88 | HR 88 | Resp 20 | Ht 72.0 in | Wt 239.0 lb

## 2015-12-09 DIAGNOSIS — F3162 Bipolar disorder, current episode mixed, moderate: Secondary | ICD-10-CM | POA: Diagnosis not present

## 2015-12-09 DIAGNOSIS — F200 Paranoid schizophrenia: Secondary | ICD-10-CM

## 2015-12-09 DIAGNOSIS — F988 Other specified behavioral and emotional disorders with onset usually occurring in childhood and adolescence: Secondary | ICD-10-CM

## 2015-12-09 DIAGNOSIS — F909 Attention-deficit hyperactivity disorder, unspecified type: Secondary | ICD-10-CM | POA: Diagnosis not present

## 2015-12-09 DIAGNOSIS — F518 Other sleep disorders not due to a substance or known physiological condition: Secondary | ICD-10-CM | POA: Diagnosis not present

## 2015-12-09 DIAGNOSIS — R400 Somnolence: Secondary | ICD-10-CM | POA: Diagnosis not present

## 2015-12-09 NOTE — Progress Notes (Signed)
SLEEP MEDICINE CLINIC   Provider:  Larey Seat, M D  Referring Provider: Susy Frizzle, MD Primary Care Physician:  Odette Fraction, MD  Chief Complaint  Patient presents with  . New Patient (Initial Visit)    sleepy all the time during the day, had sleep study 10 years ago, rm 10, alone    HPI:  Tony Campos is a 50 y.o. male , seen here as a referral  from Dr. Dennard Schaumann for a sleep evaluation.   Chief complaint according to patient : " Hypersomnia, diagnosed with a touch of narcolepsy " .  Tony Campos is an established patient of Dr. Jenna Luo at [facility]. He feels always tired and also sleep he sleeps at least 7 hours every night he does not feel that his sleep is restorative or refreshing. 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  He is considered disabled since late 2008 , reason listed as bipolar, paranoid psychosis.   It is hard for him to arise and he feels fatigued as well as easily distractible.  He is still working on his paperwork that was given to him 45 minutes ago in the waiting room, where he played on his smart phone.  He reports he can watch TV and fall asleep within minutes. He may go to bed to fall asleep in minutes.  He was found to be mildly anemic in recent lab test and was also undergoing vitamin B12 checks.  He reports that he has cyclic sleep needs that he may do for several days well with just 2 or 3 hours of sleep and then "as if a switch was flipped " Sunday needs 10 or 12 hours of sleep and still feels sleepy at day time. This cyclic pattern is usually seen with behavior health issues.   Sleep habits are as follows: The patient usually falls asleep on his couch often by watching TV. Usually he falls asleep in a seated position. This occurs between 5 PM and baby 8 PM depending on when he sits down to watch TV. However it seems to be predictable that he will  fall asleep. Between 3 and 4 AM he will transfer to his bedroom and continues to sleep there until his dog will wake him up which is somewhere between 8 and 10 AM. Spontaneously he may not wake up otherwise and  oversleep. If he has appointments in the morning he has to set an alarm. His wife shares the same bedroom with him. His wife reports him to snore but has not witnessed apneas. The patient reports that he may wake up with headaches and with a dry mouth, the mouth is dry because of medications as well. At the current time he seems to easily sleep 12 hours a day. And still feels in daytime that he could go to sleep any time.   Sleep medical history and family sleep history: No known family members with hypersomnia, and no diagnosed apnea.   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  Review of Systems: Out of a complete 14 system review, the patient complains of only the following symptoms, and all other reviewed systems are negative. Review of systems is endorsed for blurred vision for loud snoring, for feeling cold, for increased thirst and dry mouth, easy bruising, joint  pain and aching muscles, fatigue, weight gain, tinnitus, leg swelling, death interest in activities, racing thoughts, the feeling of never having enough sleep but gets off too much sleep. He also endorsed depression, anxiety, confusion, memory loss, headaches, insomnia, and restless legs.  Past medical history is positive for hypothyroidism, high blood pressure, depression anxiety. At one time he was labeled as a paranoid schizophrenic. He has undergone right knee surgery in 1992 couple tunnel surgeries 1998 sinus surgery 2005, cholecystectomy 2007, right knee surgery 2013 left knee surgery 2014. Both were arthroscopic knee surgeries.  One of the patient's concerns is that he may expect a knee replacement  surgery in the near future and that should he have apnea he will need special dispensation for his anesthesiologist.  Epworth score 18  , Fatigue severity score 55  ,  How likely are you to doze in the following situations: 0 = not likely, 1 = slight chance, 2 = moderate chance, 3 = high chance  Sitting and Reading?3 Watching Television?3 Sitting inactive in a public place (theater or meeting)?3 Lying down in the afternoon when circumstances permit?2 Sitting and talking to someone?1 Sitting quietly after lunch without alcohol?3 In a car, while stopped for a few minutes in traffic?1 As a passenger in a car for an hour without a break?1  Total =18    Social History   Social History  . Marital Status: Married    Spouse Name: N/A  . Number of Children: N/A  . Years of Education: N/A   Occupational History  . [employer] Part Time    Social History Main Topics  . Smoking status: Current Every Day Smoker -- 1.00 packs/day    Types: Cigarettes  . Smokeless tobacco: Not on file  . Alcohol Use: No  . Drug Use: No  . Sexual Activity: Not on file   Other Topics Concern  . Not on file   Social History Narrative    Family History  Problem Relation Age of Onset  . Cancer Maternal Grandfather     stomach    Past Medical History  Diagnosis Date  . Acid reflux   . Anxiety   . Depression   . Hypothyroidism   . Hypertension   . Hyperlipidemia   . Testosterone deficiency   . Spondylosis   . Genital warts   . Smoker   . Meningitis   . PAD (peripheral artery disease) ([place])   . Smoker     Past Surgical History  Procedure Laterality Date  . Knee surgery      right  . Wrist surgery      right and left  . Cholecystectomy  2007  . Nasal septum surgery    . Nasal sinus surgery      Current Outpatient Prescriptions  Medication Sig Dispense Refill  . aspirin 81 MG tablet Take 81 mg by mouth daily.    . baclofen (LIORESAL) 20 MG tablet Take 20 mg  by mouth 3 (three) times daily.    Marland Kitchen buPROPion (WELLBUTRIN XL) 150 MG 24 hr tablet Take 450 mg by mouth daily.    . cilostazol (PLETAL) 100 MG tablet Take 100 mg by mouth 2 (two) times daily.    . clonazePAM (KLONOPIN) 1 MG tablet Take 1 mg by mouth 2 (two) times daily as needed for anxiety.    . fentaNYL (DURAGESIC - DOSED MCG/HR) 75 MCG/HR Place 75 mcg onto the skin every 3 (three) days.    Marland Kitchen  furosemide (LASIX) 40 MG tablet Take 40mg  BID prn 60 tablet 6  . ibuprofen (ADVIL,MOTRIN) 800 MG tablet Take 800 mg by mouth as needed.    . lamoTRIgine (LAMICTAL) 200 MG tablet Take 200 mg by mouth daily.    Marland Kitchen levothyroxine (SYNTHROID, LEVOTHROID) 125 MCG tablet TAKE 1 TABLET BY MOUTH EVERY DAY BEFORE BREAKFAST 90 tablet 3  . losartan-hydrochlorothiazide (HYZAAR) 50-12.5 MG tablet TAKE 1 TABLET BY MOUTH EVERY DAY 90 tablet 3  . morphine (MSIR) 15 MG tablet Take 15 mg by mouth every 4 (four) hours as needed for severe pain.    . naproxen sodium (ANAPROX) 220 MG tablet Take 220 mg by mouth 2 (two) times daily with a meal.    . niacin (NIASPAN) 500 MG CR tablet   2  . pantoprazole (PROTONIX) 40 MG tablet Take 1 tablet (40 mg total) by mouth 2 (two) times daily. 180 tablet 3  . potassium chloride SA (K-DUR,KLOR-CON) 10 MEQ tablet Take 1 tablet (10 mEq total) by mouth 2 (two) times daily as needed. With lasix 60 tablet 2  . simvastatin (ZOCOR) 40 MG tablet Take 40 mg by mouth daily.    . valACYclovir (VALTREX) 500 MG tablet Take 1 tablet (500 mg total) by mouth daily. 90 tablet 3  . [DISCONTINUED] esomeprazole (NEXIUM) 40 MG capsule Take 40 mg by mouth daily before breakfast.      . [DISCONTINUED] potassium chloride (KLOR-CON) 10 MEQ CR tablet Take 10 mEq by mouth daily.       No current facility-administered medications for this visit.    Allergies as of 12/09/2015 - Review Complete 12/09/2015  Allergen Reaction Noted  . Testosterone  05/19/2011    Vitals: BP 158/88 mmHg  Pulse 88  Resp 20  Ht 6'  (1.829 m)  Wt 239 lb (108.41 kg)  BMI 32.41 kg/m2 Last Weight:  Wt Readings from Last 1 Encounters:  12/09/15 239 lb (108.41 kg)   [id number] mass index is 32.41 kg/(m^2).     Last Height:   Ht Readings from Last 1 Encounters:  12/09/15 6' (1.829 m)    Physical exam:  General: The patient is awake, alert and appears not in acute distress. The patient is well groomed. Head: Normocephalic, atraumatic. Neck is supple. Mallampati 4,  neck circumference:17. Nasal airflow unrestricted today , often congested , TMJ is  Not  evident . Retrognathia is seen. Full facial hair.  Cardiovascular:  Regular rate and rhythm , without  murmurs or carotid bruit, and without distended neck veins. Respiratory: Lungs are clear to auscultation. Skin:  Without evidence of edema, or rash Trunk: BMI is elevated   Neurologic exam : The patient is awake and alert, oriented to place and time.   Memory subjective described as impaired - addressed with psychiatry.   Attention span & concentration ability appears impaired, he is easily distractible.Speech is hesitant , became fluent,  without  dysarthria, dysphonia or aphasia.  Mood and affect are appropriate.  Cranial nerves: Pupils are equal and briskly reactive to light. Funduscopic exam without  evidence of pallor or edema. Extraocular movements  in vertical and horizontal planes intact and without nystagmus. Visual fields by finger perimetry are intact. Hearing to finger rub intact.   Facial sensation intact to fine touch.  Facial motor strength is symmetric and tongue and uvula move midline. Shoulder shrug was symmetrical.   Motor exam:   Normal tone, muscle bulk and symmetric strength in all extremities.  Sensory:  Fine touch, pinprick  and vibration were tested in all extremities. Proprioception tested in the upper extremities was normal.  Coordination: Rapid alternating movements in the fingers/hands was normal. Finger-to-nose maneuver  normal without  evidence of ataxia, dysmetria or tremor.  Gait and station: Patient walks without assistive device and is able unassisted to climb up to the exam table. Strength within normal limits.  Stance is stable and normal.  Deep tendon reflexes: in the  upper and lower extremities are symmetric and intact.  The patient was advised of the nature of the diagnosed sleep disorder , the treatment options and risks for general a health and wellness arising from not treating the condition.  I spent more than 50  minutes of face to face time with the patient.  Greater than 50% of time was spent in counseling and coordination of care. We have discussed the diagnosis and differential and I answered the patient's questions.     Assessment:  After physical and neurologic examination, review of laboratory studies,  Personal review of imaging studies, reports of other /same  Imaging studies ,  Results of polysomnography/ neurophysiology testing and pre-existing records as far as provided in visit., my assessment is   1) Mr. Quispe has several symptoms that would lead to the assumption that he may have obstructive sleep apnea including nasal rhinitis, he has retrognathia. He has a high-grade Mallampati at the 17 inch neck circumference. His wife has noted him to snore and he seems to sleep a significant amount of time in a seated position on the sofa. Because he has acid reflux he has learned to sleep on 2 pillows elevating the head of bed and allowing him also to breathe easier. 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  2) hypersomnia this could be related to mental health issues because of his cyclic character their weeks of insomnia alternating with weeks of hypersomnia the more rapid at patient cycles is usually a sign of a bipolar underlying disorder. It would be important to work  with a psychiatrist to see if a stabilization can be achieved.  3) with his current  level of sleepiness he experiences a high degree of social and other impairment in his daily life. He refrains from participating in social activities feeling that he has neither the energy nor the ability to participate and pay attention.  4) sleep paralysis reported, Trance stages , related to mental health? Vivid dreams, dream intrusion. This could be narcoleptic. He is able to continue the same dream after going back to bed.     Plan:  Treatment plan and additional workup :  Dear Dr. Dennard Schaumann, I will ask Mr. Zwack to schedule a overnight sleep study with [place]. Since he is hypersonic it should not be a problem for him to fall asleep it may be a problem for him to wake up at 6 AM when the usually closer lap in the morning. I would like for him not to change any of his current medications since there important in stabilizing his mental health condition. A valid MS LT cannot be obtained on the patient taking Lamictal, bupropion, as these suppress REM sleep. The goal here is to rule out another sleep disorder causing his hypersomnia such as apnea but non-organic reasons as described above place certainly a role. There is a HL a test for narcolepsy but I would like to obtain since an MS LT is certainly invalid.   Asencion Partridge Jaena Brocato  MD  12/09/2015   CC: Susy Frizzle, Md [address] [place], [place] [address]    Patient no showed for first consult appointment 11-15-15

## 2015-12-09 NOTE — Patient Instructions (Signed)
Hypersomnia Hypersomnia is when you feel extremely tired during the day even though you're getting plenty of sleep at night. You may need to take naps during the day, and you may also be extremely difficult to wake up when you are sleeping.  CAUSES  The cause of your hypersomnia may not be known. Hypersomnia may be caused by:   Medicines.  Sleep disorders, such as narcolepsy.  Trauma or injury to your head or nervous system.  Using drugs or alcohol.  Tumors.  Medical conditions, such as depression or hypothyroidism.  Genetics. SIGNS AND SYMPTOMS  The main symptoms of hypersomnia include:   Feeling extremely tired throughout the day.  Being very difficult to wake up.  Sleeping for longer and longer periods.  Taking naps throughout the day. Other symptoms may include:   Feeling:  Restless.  Annoyed.  Anxious.  Low energy.  Having difficulty:  Remembering.  Speaking.  Thinking.  Losing your appetite.  Experiencing hallucinations. DIAGNOSIS  Hypersomnia may be diagnosed by:  Medical history and physical exam. This will include a sleep history.  Completing sleep logs.  Tests may also be done, such as:  Polysomnography.  Multiple sleep latency test (MSLT). TREATMENT  There is no cure for hypersomnia, but treatment can be very effective in helping manage the condition. Treatment may include:  Lifestyle and sleeping strategies to help cope with the condition.  Stimulant medicines.  Treating any underlying causes of hypersomnia. HOME CARE INSTRUCTIONS  Take medicines only as directed by your health care provider.  Schedule short naps for when you feel sleepiest during the day. Tell your employer or teachers that you have hypersomnia. You may be able to adjust your schedule to include time for naps.  Avoid drinking alcohol or caffeinated beverages.  Do not eat a heavy meal before bedtime. Eat at about the same times every day.  Do not drive or  operate heavy machinery if you are sleepy.  Do not swim or go out on the water without a life jacket.  If possible, adjust your schedule so that you do not have to work or be active at night.  Keep all follow-up visits as directed by your health care provider. This is important. SEEK MEDICAL CARE IF:   You have new symptoms.  Your symptoms get worse. SEEK IMMEDIATE MEDICAL CARE IF:  You have serious thoughts of hurting yourself or someone else.   This information is not intended to replace advice given to you by your health care provider. Make sure you discuss any questions you have with your health care provider.   Document Released: 11/03/2002 Document Revised: 12/04/2014 Document Reviewed: 06/18/2014 Elsevier Interactive Patient Education 2016 [employer] Inc[employer]  

## 2015-12-15 ENCOUNTER — Other Ambulatory Visit (HOSPITAL_COMMUNITY): Payer: Self-pay | Admitting: Psychiatry

## 2015-12-20 ENCOUNTER — Telehealth: Payer: Self-pay

## 2015-12-20 NOTE — Telephone Encounter (Signed)
Called to to tell him that his HLA was negative (normal.) I left a message telling him that his blood work was normal (per DPR) on his cell phone, and asked him to call me back with further questions.

## 2015-12-29 DIAGNOSIS — G4730 Sleep apnea, unspecified: Secondary | ICD-10-CM | POA: Insufficient documentation

## 2015-12-29 HISTORY — DX: Sleep apnea, unspecified: G47.30

## 2016-01-10 ENCOUNTER — Ambulatory Visit (INDEPENDENT_AMBULATORY_CARE_PROVIDER_SITE_OTHER): Payer: Medicare Other | Admitting: Neurology

## 2016-01-10 DIAGNOSIS — F3162 Bipolar disorder, current episode mixed, moderate: Secondary | ICD-10-CM

## 2016-01-10 DIAGNOSIS — F518 Other sleep disorders not due to a substance or known physiological condition: Secondary | ICD-10-CM

## 2016-01-10 DIAGNOSIS — G4733 Obstructive sleep apnea (adult) (pediatric): Secondary | ICD-10-CM

## 2016-01-10 DIAGNOSIS — F200 Paranoid schizophrenia: Secondary | ICD-10-CM

## 2016-01-10 DIAGNOSIS — R400 Somnolence: Secondary | ICD-10-CM

## 2016-01-10 DIAGNOSIS — F988 Other specified behavioral and emotional disorders with onset usually occurring in childhood and adolescence: Secondary | ICD-10-CM

## 2016-01-11 NOTE — Sleep Study (Signed)
Please see the scanned sleep study interpretation located in the Procedure tab within the Chart Review section. 

## 2016-01-12 HISTORY — PX: OTHER SURGICAL HISTORY: SHX169

## 2016-01-17 ENCOUNTER — Telehealth: Payer: Self-pay

## 2016-01-17 DIAGNOSIS — G4731 Primary central sleep apnea: Secondary | ICD-10-CM

## 2016-01-17 NOTE — Telephone Encounter (Signed)
I called pt to discuss sleep study results. I advised pt that his sleep study revealed complex, mostly obstructive sleep apnea, and Dr .Brett Fairy recommends returning for bipap titration because the use of cpap did not influence the oxygen desaturations much and additional oxygen titration will be ordered if bipap does not lift the nadir. Pt is agreeable to coming in for a bipap titration study. I advised pt that our sleep lab will call him to set up his study. Pt verbalized understanding.

## 2016-01-20 ENCOUNTER — Encounter: Payer: Self-pay | Admitting: Family Medicine

## 2016-02-02 ENCOUNTER — Ambulatory Visit (INDEPENDENT_AMBULATORY_CARE_PROVIDER_SITE_OTHER): Payer: Medicare Other | Admitting: Neurology

## 2016-02-02 DIAGNOSIS — G4731 Primary central sleep apnea: Secondary | ICD-10-CM

## 2016-02-02 DIAGNOSIS — G4733 Obstructive sleep apnea (adult) (pediatric): Secondary | ICD-10-CM

## 2016-02-03 NOTE — Sleep Study (Signed)
Please see the scanned sleep study interpretation located in the procedure tab within the chart review section.   

## 2016-02-10 ENCOUNTER — Telehealth: Payer: Self-pay

## 2016-02-10 DIAGNOSIS — G4731 Primary central sleep apnea: Secondary | ICD-10-CM

## 2016-02-10 NOTE — Telephone Encounter (Signed)
I called to discuss sleep study results with pt. No answer, left a message asking him to call me back.

## 2016-02-11 NOTE — Telephone Encounter (Signed)
Patient is returning your call.  

## 2016-02-14 NOTE — Telephone Encounter (Signed)
Spoke to pt regarding his sleep study results. I advised him that his cpap titration showed significant improvement and less respiratory events during titration. Dr. Brett Fairy recommends starting an ASV. Pt is agreeable, he states that he woke up "feeling really good" after using it. I advised pt that I would send an order to a medical equipment company and they would contact him to get it set up. I advised pt to use his bipap at least four hours per night. Pt said that he had trouble with the FFM but once he got used to the mask he was ok. I advised pt to avoid caffeine-containing beverages and chocolate. A follow up appt was scheduled for 5/30 at 2:30. Pt verbalized understanding. Will send to [facility] [name].  Pt states that he is waking up with severe reflux and has episodes with waking up with acid in his mouth. His PCP is going to refer him to a GI practice to potentially have him scoped. He is already on protonix. He wants to know if the BIPAP is going to cause aspiration from the reflux. He is concerned that the pressure will force the reflux into his lungs. I advised him that I would ask Dr. Brett Fairy for suggestions and I would call him back. Pt verbalized understanding.

## 2016-02-15 ENCOUNTER — Telehealth: Payer: Self-pay

## 2016-02-15 DIAGNOSIS — G4731 Primary central sleep apnea: Secondary | ICD-10-CM

## 2016-02-15 NOTE — Telephone Encounter (Signed)
Received this notice from [facility].  Can you please clarify whether this pt needs an ASV or bipap ST?  The current order is "Start ASV 13/5 cm H2O with 15 cm H2O pressure support." I have the sleep study at my desk for further review, if needed.

## 2016-02-15 NOTE — Telephone Encounter (Signed)
Robin , please review the titration . ASAP

## 2016-02-15 NOTE — Telephone Encounter (Signed)
-----   Message from Leeroy Bock sent at 02/15/2016 11:23 AM EDT ----- I have been asked to find out exactly which settings are needed for this patient. Per our RT Team.. "-The pressure setting submitted is not an ASV pressure setting.  This looks like a BIPAP ST pressure setting. (ASV pressure settings are EPAP______, Min Pressure Support__________, Max Pressure Support___________) (BIPAP ST pressure settings are Inspiratory Pressure/Expiratory Pressure with BUR_____) Can you clarify which settings he needs to be at and whether it is for ASV or BIPAP ST.  Also, There are 3 insurances listed in the patients chart. I have left a msg with the patient to try and confirm which one he is using.  Do you guys happen to know?  Thanks! Mary   ----- Message -----    From: Lester [place], RN    Sent: 02/14/2016  11:59 AM      To: Leeroy Bock  New order in! Does he need a follow up appt with [place] 60-90 days for insurance purposes?

## 2016-02-16 ENCOUNTER — Encounter: Payer: Self-pay | Admitting: *Deleted

## 2016-02-17 NOTE — Telephone Encounter (Signed)
Robin reviewed the titration and corrected the ASV order to include ASV settings. New order placed.

## 2016-02-21 ENCOUNTER — Ambulatory Visit: Payer: Medicare Other | Admitting: Family Medicine

## 2016-02-22 ENCOUNTER — Ambulatory Visit (INDEPENDENT_AMBULATORY_CARE_PROVIDER_SITE_OTHER): Payer: Medicare Other | Admitting: Family Medicine

## 2016-02-22 ENCOUNTER — Encounter: Payer: Self-pay | Admitting: Family Medicine

## 2016-02-22 VITALS — BP 118/78 | HR 84 | Temp 98.1°F | Resp 18 | Ht 72.0 in | Wt 244.0 lb

## 2016-02-22 DIAGNOSIS — K219 Gastro-esophageal reflux disease without esophagitis: Secondary | ICD-10-CM

## 2016-02-22 MED ORDER — DEXLANSOPRAZOLE 60 MG PO CPDR: 60.0000 mg | DELAYED_RELEASE_CAPSULE | Freq: Every day | ORAL | Status: DC

## 2016-02-22 NOTE — Progress Notes (Signed)
Subjective:    Patient ID: Tony Campos, male    DOB: 1966/04/01, 50 y.o.   MRN: [id number]  Gastroesophageal Reflux  Has severe reflux.  Currently on protonix 40 mg po bid with recalcitrant heartburn.  Unable to lie supine at night due to regurgitation and severe indigestion.  Keeps him awake at night.  Unable to take prilosec or nexium due to the fact he is on plavix for PAD with recent stenting of the arteries in his right leg.   Past Medical History  Diagnosis Date  . Acid reflux   . Anxiety   . Depression   . Hypothyroidism   . Hypertension   . Hyperlipidemia   . Testosterone deficiency   . Spondylosis   . Genital warts   . Smoker   . Meningitis   . PAD (peripheral artery disease) ([place])   . Smoker   . Sleep apnea 12/2015    needs BIPAP, AHI 55/hr   Past Surgical History  Procedure Laterality Date  . Knee surgery      right  . Wrist surgery      right and left  . Cholecystectomy  2007  . Nasal septum surgery    . Nasal sinus surgery     Current Outpatient Prescriptions on File Prior to Visit  Medication Sig Dispense Refill  . aspirin 81 MG tablet Take 81 mg by mouth daily.    . baclofen (LIORESAL) 20 MG tablet Take 20 mg by mouth 3 (three) times daily.    Marland Kitchen buPROPion (WELLBUTRIN XL) 150 MG 24 hr tablet Take 450 mg by mouth daily.    . cilostazol (PLETAL) 100 MG tablet Take 100 mg by mouth 2 (two) times daily.    . clonazePAM (KLONOPIN) 1 MG tablet Take 1 mg by mouth 2 (two) times daily as needed for anxiety.    . fentaNYL (DURAGESIC - DOSED MCG/HR) 75 MCG/HR Place 75 mcg onto the skin every 3 (three) days.    . furosemide (LASIX) 40 MG tablet Take 40mg  BID prn 60 tablet 6  . ibuprofen (ADVIL,MOTRIN) 800 MG tablet Take 800 mg by mouth as needed.    . lamoTRIgine (LAMICTAL) 200 MG tablet Take 200 mg by mouth daily.    Marland Kitchen levothyroxine (SYNTHROID, LEVOTHROID) 125 MCG tablet TAKE 1 TABLET BY MOUTH EVERY DAY BEFORE BREAKFAST 90 tablet 3  . losartan-hydrochlorothiazide  (HYZAAR) 50-12.5 MG tablet TAKE 1 TABLET BY MOUTH EVERY DAY 90 tablet 3  . morphine (MSIR) 15 MG tablet Take 15 mg by mouth every 4 (four) hours as needed for severe pain.    . naproxen sodium (ANAPROX) 220 MG tablet Take 220 mg by mouth 2 (two) times daily with a meal.    . niacin (NIASPAN) 500 MG CR tablet   2  . pantoprazole (PROTONIX) 40 MG tablet Take 1 tablet (40 mg total) by mouth 2 (two) times daily. 180 tablet 3  . potassium chloride SA (K-DUR,KLOR-CON) 10 MEQ tablet Take 1 tablet (10 mEq total) by mouth 2 (two) times daily as needed. With lasix 60 tablet 2  . simvastatin (ZOCOR) 40 MG tablet Take 40 mg by mouth daily.    . valACYclovir (VALTREX) 500 MG tablet Take 1 tablet (500 mg total) by mouth daily. 90 tablet 3  . [DISCONTINUED] esomeprazole (NEXIUM) 40 MG capsule Take 40 mg by mouth daily before breakfast.      . [DISCONTINUED] potassium chloride (KLOR-CON) 10 MEQ CR tablet Take 10 mEq by mouth daily.  No current facility-administered medications on file prior to visit.   Allergies  Allergen Reactions  . Testosterone     Edema from topical applications   Social History   Social History  . Marital Status: Married    Spouse Name: N/A  . Number of Children: N/A  . Years of Education: N/A   Occupational History  . [employer] at [employer] Part Time    Social History Main Topics  . Smoking status: Current Every Day Smoker -- 1.00 packs/day    Types: Cigarettes  . Smokeless tobacco: Not on file  . Alcohol Use: No  . Drug Use: No  . Sexual Activity: Not on file   Other Topics Concern  . Not on file   Social History Narrative     Review of Systems  All other systems reviewed and are negative.      Objective:   Physical Exam  Constitutional: He appears well-developed and well-nourished. No distress.  HENT:  Right Ear: External ear normal.  Left Ear: External ear normal.  Nose: Nose normal.  Mouth/Throat: Oropharynx is clear and moist. No  oropharyngeal exudate.  Eyes: Conjunctivae are normal. Pupils are equal, round, and reactive to light.  Neck: Neck supple.  Cardiovascular: Normal rate, regular rhythm and normal heart sounds.   No murmur heard. Pulmonary/Chest: Effort normal. He has no wheezes. He has no rales. He exhibits no tenderness.  Abdominal: Soft. Bowel sounds are normal.  Musculoskeletal: He exhibits edema.  Lymphadenopathy:    He has no cervical adenopathy.  Skin: He is not diaphoretic.  Vitals reviewed.         Assessment & Plan:  Gastroesophageal reflux disease, esophagitis presence not specified - Plan: dexlansoprazole (DEXILANT) 60 MG capsule  Dc protonix.  Begin dexilant 60 mg poqday.  Discontinue all NSAIDS (motrin, alleve, naprosyn).  Recheck in 4 weeks.

## 2016-02-28 ENCOUNTER — Telehealth: Payer: Self-pay | Admitting: Neurology

## 2016-02-28 NOTE — Telephone Encounter (Signed)
Patient called, states DME company [facility] is wanting him to come in to pick up CPAP machine and go over settings. Patient "hasn't heard back from the sleep study regarding results" would like to know the results before he picks up machine also thinks it's a lot of money for a CPAP machine, when he has insurance.

## 2016-02-28 NOTE — Telephone Encounter (Signed)
Returned pt's call. No answer. Left a message asking him to call me back.

## 2016-02-29 NOTE — Telephone Encounter (Signed)
I spoke to pt. He states that he "doesn't understand what is wrong with him" and wants to understand it before he picks up his ASV. He states that the ASV will cost $91/month for 13 months. Pt is coming in with his wife today for her appt and will discuss with Dr. Brett Fairy then.

## 2016-03-20 ENCOUNTER — Telehealth: Payer: Self-pay | Admitting: Neurology

## 2016-03-20 ENCOUNTER — Telehealth: Payer: Self-pay

## 2016-03-20 NOTE — Telephone Encounter (Signed)
Thanks, Robin. Let me know if you need anything from [place].

## 2016-03-20 NOTE — Telephone Encounter (Signed)
Left patient message to return my call regarding his question on ASV machine compared to his auto cpap machine.

## 2016-03-20 NOTE — Telephone Encounter (Signed)
Patient called regarding CPAP machine, Aircurve 10 ASV, this is the one that's been prescribed that he hasn't picked up yet. States he has Aircurve 10 LAUTO, wants to know if he could use the machine he currently has, he's not sure that he can afford Aircurve 10 ASV. Please call to advise [phone]. Can leave message with the answer.

## 2016-03-21 ENCOUNTER — Other Ambulatory Visit: Payer: Self-pay | Admitting: Family Medicine

## 2016-03-21 NOTE — Telephone Encounter (Signed)
Refill appropriate and filled per protocol. 

## 2016-03-30 ENCOUNTER — Telehealth: Payer: Self-pay | Admitting: Neurology

## 2016-03-30 NOTE — Telephone Encounter (Signed)
Spoke to pt. He has not started his new ASV (he gets it on Monday). He also has not been using his old cpap. He wants to discuss memory in an office visit with Dr. Brett Fairy. I encouraged pt to use his old cpap while he is waiting for the new cpap. An office visit was made for 5/23 at 10:30. His 5/30 appt was cancelled.Pt verbalized understanding.  I advised pt that he will also have to follow up with [place] after 60 days of ASV use for insurance purposes, and this will be at the end of July. Pt verbalized understanding.

## 2016-03-30 NOTE — Telephone Encounter (Signed)
Pt called sts he is falling asleep, he can't remember having a conversation from last night, can't get words together to talk..these things happen everyday. Please call [phone] or  [phone]

## 2016-04-18 ENCOUNTER — Telehealth: Payer: Self-pay | Admitting: Neurology

## 2016-04-18 ENCOUNTER — Ambulatory Visit (INDEPENDENT_AMBULATORY_CARE_PROVIDER_SITE_OTHER): Payer: Medicare Other | Admitting: Neurology

## 2016-04-18 ENCOUNTER — Encounter: Payer: Self-pay | Admitting: Neurology

## 2016-04-18 VITALS — BP 160/80 | HR 80 | Resp 20 | Ht 72.0 in | Wt 241.0 lb

## 2016-04-18 DIAGNOSIS — F329 Major depressive disorder, single episode, unspecified: Secondary | ICD-10-CM | POA: Insufficient documentation

## 2016-04-18 DIAGNOSIS — G4737 Central sleep apnea in conditions classified elsewhere: Secondary | ICD-10-CM | POA: Insufficient documentation

## 2016-04-18 DIAGNOSIS — G3184 Mild cognitive impairment, so stated: Secondary | ICD-10-CM

## 2016-04-18 DIAGNOSIS — T7840XA Allergy, unspecified, initial encounter: Principal | ICD-10-CM

## 2016-04-18 DIAGNOSIS — R51 Headache: Secondary | ICD-10-CM

## 2016-04-18 DIAGNOSIS — F32A Depression, unspecified: Secondary | ICD-10-CM

## 2016-04-18 NOTE — Progress Notes (Signed)
SLEEP MEDICINE CLINIC   Provider:  Larey Seat, M D  Referring Provider: Susy Frizzle, MD Primary Care Physician:  Odette Fraction, MD  Chief Complaint  Patient presents with  . Follow-up    just started asv beginning of this month, needs to follow up in July for insuranc purposes  . Memory Loss    having spells of memory    HPI:  Tony Campos is a 50 y.o. male , was seen here as a referral  from Dr. Dennard Schaumann for a sleep evaluation.   Chief complaint according to patient : " Hypersomnia, diagnosed with a touch of narcolepsy " .  Tony Campos is an established patient of Dr. Jenna Luo at [facility]. He feels always tired and also sleep he sleeps at least 7 hours every night he does not feel that his sleep is restorative or refreshing. 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  He is considered disabled since late 2008 , reason listed as bipolar, paranoid psychosis.   It is hard for him to arise and he feels fatigued as well as easily distractible.  He is still working on his paperwork that was given to him 45 minutes ago in the waiting room, where he played on his smart phone.  He reports he can watch TV and fall asleep within minutes. He may go to bed to fall asleep in minutes. He was found to be mildly anemic in recent lab test and was also undergoing vitamin B12 checks.  He reports that he has cyclic sleep needs that he may do for several days well with just 2 or 3 hours of sleep and then "as if a switch was flipped " Sunday needs 10 or 12 hours of sleep and still feels sleepy at day time. This cyclic pattern is usually seen with behavior health issues.   Sleep habits are as follows: The patient usually falls asleep on his couch often by watching TV. Usually he falls asleep in a seated position. This occurs between 5 PM and baby 8 PM depending on when he sits down to watch TV. However  it seems to be predictable that he will fall asleep. Between 3 and 4 AM he will transfer to his bedroom and continues to sleep there until his dog will wake him up which is somewhere between 8 and 10 AM. Spontaneously he may not wake up otherwise and  oversleep. If he has appointments in the morning he has to set an alarm. His wife shares the same bedroom with him. His wife reports him to snore but has not witnessed apneas. The patient reports that he may wake up with headaches and with a dry mouth, the mouth is dry because of medications as well. At the current time he seems to easily sleep 12 hours a day. And still feels in daytime that he could go to sleep any time.   Interval history from 04/18/2016, Tony Campos is here today for 2 reasons one is to follow-up on his recent sleep studies the other to undergo memory testing.  The patient underwent a split night polysomnography on 01/10/2016 the patient was diagnosed with a severe AHI of 55.0 during REM sleep interestingly only 40.0 but in supine accentuated to 73.0. He has chronic back pain and therefore tries to avoid sleeping in certain positions. He cannot tolerate prone position, for example. CPAP was initiated at 5  cm water pressure and explored up to 12 cm water pressure. It was evident that he tolerated a pressure of 7 cm water very well and at higher pressures seemed to trigger central apneas. During his Pap titration there was no satisfying result noted and he had to return for a full night BiPAP titration on March 8. This time he was started on adapt SV 13 cm maximum, 5 cm minimum pressure support fullface mask. The AHI was still not improved until he reached the highest setting at about 3:30 AM.  I am very pleased to see his download and compliance report today he had the machine available for 15 days and has used it for 93 percent of the time. Average user time is 5 hours and 40 minutes at night, the ASV is set at an expiratory pressure of 5  minimum pressure support of 4 maximum pressure support of 15 and his residual AHI is 1.6. This is an excellent alleviation of his previously severe sleep apnea. There is also no major air leak noted ,the mask seems to fit well.  He has no problems sleeping with this mask and at the current pressures. I would like to react to read that the patient has full facial hair yet seems to achieve a good air seal.  We also tested his memory and cognition today Tony Campos underwent a [place] cognitive assessment was 26 out of 30 points. His main difficulties with the recall of words after about 5 minutes. His short term memory seems to be impaired. He did not show signs of difficulties with spelling, subtraction, naming to visual spatial orientation. He was fully oriented to date and place.   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  Review of Systems: Out of a complete 14 system review, the patient complains of only the following symptoms, and all other reviewed systems are negative. Review of systems is endorsed for blurred vision for loud snoring, for feeling cold, for increased thirst and dry mouth, easy bruising, joint pain and aching muscles, fatigue, weight gain, tinnitus, leg swelling, death interest in activities, racing thoughts, the feeling of never having enough sleep but gets off too much sleep. He also endorsed depression, anxiety, confusion, memory loss, headaches, insomnia, and restless legs.  Past medical history is positive for hypothyroidism, high blood pressure, depression anxiety. At one time he was labeled as a paranoid schizophrenic. He has undergone right knee surgery in 1992 couple tunnel surgeries 1998 sinus surgery 2005, cholecystectomy 2007, right knee surgery 2013 left knee surgery 2014. Both were arthroscopic knee surgeries. One of the patient's concerns is  that he may expect a knee replacement surgery in the near future and that should he have apnea he will need special dispensation for his anesthesiologist.  Epworth score 18  , Fatigue severity score 55  ,  How likely are you to doze in the following situations: 0 = not likely, 1 = slight chance, 2 = moderate chance, 3 = high chance  Sitting and Reading?3 Watching Television?2 Sitting inactive in a public place (theater or meeting)?2 Lying down in the afternoon when circumstances permit?2 Sitting and talking to someone?1 Sitting quietly after lunch without alcohol?3 In a car, while stopped for a few minutes in traffic?1 As a passenger in a car for an hour without a break?2  Total =18    Social History   Social History  .  Marital Status: Married    Spouse Name: N/A  . Number of Children: N/A  . Years of Education: N/A   Occupational History  . [employer] at [employer] Part Time    Social History Main Topics  . Smoking status: Current Every Day Smoker -- 1.00 packs/day    Types: Cigarettes  . Smokeless tobacco: Not on file  . Alcohol Use: No  . Drug Use: No  . Sexual Activity: Not on file   Other Topics Concern  . Not on file   Social History Narrative    Family History  Problem Relation Age of Onset  . Cancer Maternal Grandfather     stomach    Past Medical History  Diagnosis Date  . Acid reflux   . Anxiety   . Depression   . Hypothyroidism   . Hypertension   . Hyperlipidemia   . Testosterone deficiency   . Spondylosis   . Genital warts   . Smoker   . Meningitis   . PAD (peripheral artery disease) ([place])   . Smoker   . Sleep apnea 12/2015    needs BIPAP, AHI 55/hr    Past Surgical History  Procedure Laterality Date  . Knee surgery      right  . Wrist surgery      right and left  . Cholecystectomy  2007  . Nasal septum surgery    . Nasal sinus surgery      Current Outpatient Prescriptions  Medication Sig Dispense Refill  . aspirin 81  MG tablet Take 81 mg by mouth daily.    . baclofen (LIORESAL) 20 MG tablet Take 20 mg by mouth 3 (three) times daily.    Marland Kitchen buPROPion (WELLBUTRIN XL) 150 MG 24 hr tablet Take 450 mg by mouth daily.    . cilostazol (PLETAL) 100 MG tablet Take 100 mg by mouth 2 (two) times daily.    . clonazePAM (KLONOPIN) 1 MG tablet Take 1 mg by mouth 2 (two) times daily as needed for anxiety.    . clopidogrel (PLAVIX) 75 MG tablet Take 75 mg by mouth.    . dexlansoprazole (DEXILANT) 60 MG capsule Take 1 capsule (60 mg total) by mouth daily. 30 capsule 11  . fentaNYL (DURAGESIC - DOSED MCG/HR) 25 MCG/HR patch 25 mcg.  0  . furosemide (LASIX) 40 MG tablet Take 40mg  BID prn 60 tablet 6  . ibuprofen (ADVIL,MOTRIN) 800 MG tablet Take 800 mg by mouth as needed.    . lamoTRIgine (LAMICTAL) 200 MG tablet Take 200 mg by mouth daily.    Marland Kitchen levothyroxine (SYNTHROID, LEVOTHROID) 125 MCG tablet TAKE 1 TABLET BY MOUTH EVERY DAY BEFORE BREAKFAST 90 tablet 3  . losartan-hydrochlorothiazide (HYZAAR) 50-12.5 MG tablet TAKE 1 TABLET BY MOUTH EVERY DAY 90 tablet 3  . morphine (MSIR) 15 MG tablet Take 15 mg by mouth every 4 (four) hours as needed for severe pain.    . naproxen sodium (ANAPROX) 220 MG tablet Take 220 mg by mouth 2 (two) times daily with a meal.    . niacin (NIASPAN) 500 MG CR tablet   2  . pantoprazole (PROTONIX) 40 MG tablet Take 1 tablet (40 mg total) by mouth 2 (two) times daily. 180 tablet 3  . potassium chloride (K-DUR) 10 MEQ tablet TAKE 1 TABLET BY MOUTH TWICE DAILY AS NEEDED WITH LASIX. 60 tablet 0  . potassium chloride SA (K-DUR,KLOR-CON) 10 MEQ tablet Take 1 tablet (10 mEq total) by mouth 2 (two) times daily  as needed. With lasix 60 tablet 2  . simvastatin (ZOCOR) 40 MG tablet Take 40 mg by mouth daily.    . valACYclovir (VALTREX) 500 MG tablet Take 1 tablet (500 mg total) by mouth daily. 90 tablet 3  . [DISCONTINUED] esomeprazole (NEXIUM) 40 MG capsule Take 40 mg by mouth daily before breakfast.       No  current facility-administered medications for this visit.    Allergies as of 04/18/2016 - Review Complete 04/18/2016  Allergen Reaction Noted  . Testosterone  05/19/2011    Vitals: BP 160/80 mmHg  Pulse 80  Resp 20  Ht 6' (1.829 m)  Wt 241 lb (109.317 kg)  BMI 32.68 kg/m2 Last Weight:  Wt Readings from Last 1 Encounters:  04/18/16 241 lb (109.317 kg)   [id number] mass index is 32.68 kg/(m^2).     Last Height:   Ht Readings from Last 1 Encounters:  04/18/16 6' (1.829 m)    Physical exam:  General: The patient is awake, alert and appears not in acute distress. The patient is well groomed. Head: Normocephalic, atraumatic. Neck is supple. Mallampati 4,  neck circumference:17. Nasal airflow unrestricted today , often congested , TMJ is  Not  evident . Retrognathia is seen. Full facial hair.  Cardiovascular:  Regular rate and rhythm , without  murmurs or carotid bruit, and without distended neck veins. Respiratory: Lungs are clear to auscultation. Skin:  Without evidence of edema, or rash Trunk: BMI is elevated   Neurologic exam : The patient is awake and alert, oriented to place and time.   Memory subjective described as impaired - addressed with psychiatry.   Attention span & concentration ability appears impaired, he is easily distractible. Speech is hesitant , became more fluent without  dysarthria, dysphonia or aphasia.  Mood and affect are depressed. Montreal Cognitive Assessment  04/18/2016  Visuospatial/ Executive (0/5) 5  Naming (0/3) 3  Attention: Read list of digits (0/2) 1  Attention: Read list of letters (0/1) 1  Attention: Serial 7 subtraction starting at 100 (0/3) 3  Language: Repeat phrase (0/2) 2  Language : Fluency (0/1) 1  Abstraction (0/2) 2  Delayed Recall (0/5) 2  Orientation (0/6) 6  Total 26  Adjusted Score (based on education) 26     Cranial nerves: Pupils are equal and briskly reactive to light. Hearing to finger rub intact.   Facial  sensation intact to fine touch.  Facial motor strength is symmetric and tongue and uvula move midline. Shoulder shrug was symmetrical.   Motor exam:   Normal tone, muscle bulk and symmetric strength in all extremities. Sensory:  Fine touch, pinprick and vibration were tested in all extremities. Proprioception tested in the upper extremities was normal. Coordination: Rapid alternating movements in the fingers/hands was normal. Finger-to-nose maneuver  normal without evidence of ataxia, dysmetria or tremor. Gait and station: Patient walks without assistive device and is able unassisted to climb up to the exam table. Strength within normal limits.  Stance is stable and normal.  Deep tendon reflexes: in the  upper and lower extremities are symmetric and intact.   The patient was advised of the nature of the diagnosed sleep disorder , the treatment options and risks for general a health and wellness arising from not treating the condition.  I spent more than 40  minutes of face to face time with the patient. The patient has a low trigger point for conversion from obstructive sleep apnea to central apnea, this may be partially due  to the medication such as muscle relaxants and pain medication. These medications can induce central apneas easily. His CPAP titration was unsuccessful and he had to switched from BiPAP to ASV. He is severely depressed, financially strained. His MOCA was evident for short term memory loss.  Greater than 50% of time was spent in counseling and coordination of care. We have discussed the diagnosis and differential and I answered the patient's questions.     Assessment:  After physical and neurologic examination, review of laboratory studies,  Personal review of imaging studies, reports of other /same  Imaging studies ,  Results of polysomnography/ neurophysiology testing and pre-existing records as far as provided in visit., my assessment is   1) severe apnea, central conversion  under simple CPAP.  2) hypersomnia persistent  this could be related to mental health issues because of his cyclic character their weeks of insomnia alternating with weeks of hypersomnia the more rapid at patient cycles is usually a sign of a bipolar underlying disorder. It would be important to work  with a psychiatrist to see if a stabilization can be achieved.  3) depression.  with his current level of sleepiness he experiences a high degree of social and other impairment in his daily life. He refrains from participating in social activities feeling that he has neither the energy nor the ability to participate and pay attention.  4) Memory impairment , not to the degree of dementia but cognitive impairment - short-term memory loss is evident. 3 out of the 4 lost points doing a smoker were in the delayed recall of words. Only one which seemed to have reported accidental was and repetition of a sequence of numbers .  Plan:  Treatment plan and additional workup :  Dear Dr. Dennard Schaumann, I will ask Tony Campos to continue using his ASV, but he is financially strained. He is significantly depressed and I believe this affects his Epworth score. I will recommend to see a psychology/ psychiatry regularly. He follows here for sleep care in July and thereafter yearly.   Asencion Partridge Lewie Deman MD  04/18/2016   CC: Susy Frizzle, Md [address] [place], [place] [address]    Patient no showed for first consult appointment 11-15-15

## 2016-04-18 NOTE — Telephone Encounter (Signed)
Patient called to advise he is running 7-10 minutes late for 10:30 appointment this morning, to arrive at 10:15am, will be here by or before 10:30am.

## 2016-04-20 ENCOUNTER — Other Ambulatory Visit: Payer: Self-pay | Admitting: Family Medicine

## 2016-04-20 NOTE — Telephone Encounter (Signed)
Refill appropriate and filled per protocol. 

## 2016-04-25 ENCOUNTER — Ambulatory Visit: Payer: Self-pay | Admitting: Neurology

## 2016-05-09 ENCOUNTER — Ambulatory Visit: Payer: Medicare Other | Admitting: Neurology

## 2016-05-22 ENCOUNTER — Other Ambulatory Visit: Payer: Self-pay | Admitting: Family Medicine

## 2016-06-15 ENCOUNTER — Encounter: Payer: Self-pay | Admitting: Neurology

## 2016-06-15 ENCOUNTER — Ambulatory Visit (INDEPENDENT_AMBULATORY_CARE_PROVIDER_SITE_OTHER): Payer: Medicare Other | Admitting: Neurology

## 2016-06-15 VITALS — BP 160/80 | HR 88 | Resp 20 | Ht 72.0 in | Wt 238.0 lb

## 2016-06-15 DIAGNOSIS — Z7189 Other specified counseling: Secondary | ICD-10-CM | POA: Insufficient documentation

## 2016-06-15 DIAGNOSIS — G4710 Hypersomnia, unspecified: Secondary | ICD-10-CM | POA: Diagnosis not present

## 2016-06-15 DIAGNOSIS — G4714 Hypersomnia due to medical condition: Secondary | ICD-10-CM

## 2016-06-15 NOTE — Progress Notes (Signed)
SLEEP MEDICINE CLINIC   Provider:  Larey Seat, M D  Referring Provider: Susy Frizzle, MD Primary Care Physician:  Odette Fraction, MD  Chief Complaint  Patient presents with  . Follow-up    asv follow up    HPI:  Tony Campos is a 50 y.o. male , was seen here as a referral  from Dr. Dennard Schaumann for a sleep evaluation.   Chief complaint according to patient : " Hypersomnia, diagnosed with a touch of narcolepsy " .  Mr. Tony Campos is an established patient of Dr. Jenna Campos at [facility]. He feels always tired and also sleep he sleeps at least 7 hours every night he does not feel that his sleep is restorative or refreshing. 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  He is considered disabled since late 2008 , reason listed as bipolar, paranoid psychosis.   It is hard for him to arise and he feels fatigued as well as easily distractible.  He is still working on his paperwork that was given to him 45 minutes ago in the waiting room, where he played on his smart phone.  He reports he can watch TV and fall asleep within minutes. He may go to bed to fall asleep in minutes. He was found to be mildly anemic in recent lab test and was also undergoing vitamin B12 checks.  He reports that he has cyclic sleep needs that he may do for several days well with just 2 or 3 hours of sleep and then "as if a switch was flipped " Sunday needs 10 or 12 hours of sleep and still feels sleepy at day time. This cyclic pattern is usually seen with behavior health issues.   Sleep habits are as follows: The patient usually falls asleep on his couch often by watching TV. Usually he falls asleep in a seated position. This occurs between 5 PM and baby 8 PM depending on when he sits down to watch TV. However it seems to be predictable that he will fall asleep. Between 3 and 4 AM he will transfer to his bedroom and continues to  sleep there until his dog will wake him up which is somewhere between 8 and 10 AM. Spontaneously he may not wake up otherwise and  oversleep. If he has appointments in the morning he has to set an alarm. His wife shares the same bedroom with him. His wife reports him to snore but has not witnessed apneas. The patient reports that he may wake up with headaches and with a dry mouth, the mouth is dry because of medications as well. At the current time he seems to easily sleep 12 hours a day. And still feels in daytime that he could go to sleep any time.   Interval history from 04/18/2016, Mr. Tony Campos is here today for 2 reasons one is to follow-up on his recent sleep studies the other to undergo memory testing.  The patient underwent a split night polysomnography on 01/10/2016 the patient was diagnosed with a severe AHI of 55.0 during REM sleep interestingly only 40.0 but in supine accentuated to 73.0. He has chronic back pain and therefore tries to avoid sleeping in certain positions. He cannot tolerate prone position, for example. CPAP was initiated at 5 cm water pressure and explored up to 12 cm water pressure. It was evident that he tolerated a pressure of 7 cm water very  well and at higher pressures seemed to trigger central apneas. During his Pap titration there was no satisfying result noted and he had to return for a full night BiPAP titration on March 8. This time he was started on adapt SV 13 cm maximum, 5 cm minimum pressure support fullface mask. The AHI was still not improved until he reached the highest setting at about 3:30 AM. I am very pleased to see his download and compliance report today he had the machine available for 15 days and has used it for 93 percent of the time. Average user time is 5 hours and 40 minutes at night, the ASV is set at an expiratory pressure of 5 minimum pressure support of 4 maximum pressure support of 15 and his residual AHI is 1.6. This is an excellent alleviation  of his previously severe sleep apnea. There is also no major air leak noted ,the mask seems to fit well. He has no problems sleeping with this mask and at the current pressures. I would like to react to read that the patient has full facial hair yet seems to achieve a good air seal. Past medical history is positive for hypothyroidism, high blood pressure, depression anxiety. At one time he was labeled as a paranoid schizophrenic. He has undergone right knee surgery in 1992 couple tunnel surgeries 1998 sinus surgery 2005, cholecystectomy 2007, right knee surgery 2013 left knee surgery 2014. Both were arthroscopic knee surgeries. One of the patient's concerns is that he may expect a knee replacement surgery in the near future and that should he have apnea he will need special dispensation for his anesthesiologist.   We also tested his memory and cognition today Mr. Tony Campos underwent a [place] cognitive assessment was 26 out of 30 points. His main difficulties with the recall of words after about 5 minutes. His short term memory seems to be impaired. He did not show signs of difficulties with spelling, subtraction, naming to visual spatial orientation. He was fully oriented to date and place. 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   Interval history from 06/15/2016, Mr. Tony Campos has continued to use his CPAP was 90% compliance. On the same note he still very excessively daytime sleepy with 16 points on his Epworth score and 48 points on his fatigue severity score. [employer] [facility] [employer] is his durable medical equipment company he is using an air sense or air curve machine ASV. The download today showed excellent resolution of apnea his residual AHI is 1.6 his inspiratory pressure relief is 5 cm water his minimum pressure support for maximum pressure support 15 cm water he has minimal air  leaks and he uses the machine on average 5 hours and 2 minutes a day. He feels comfortable with using the CPAP  And has no issues with the interface   Review of Systems: Out of a complete 14 system review, the patient complains of only the following symptoms, and all other reviewed systems are negative. Review of systems is endorsed for blurred vision for loud snoring, for feeling cold, for increased thirst and dry mouth, easy bruising, joint pain and aching muscles, fatigue, weight gain, tinnitus, leg swelling, death interest in activities, racing thoughts, the feeling of never having enough sleep but gets off too much sleep. He also endorsed depression, anxiety, confusion, memory loss, headaches, insomnia, and restless legs.   Epworth score  16 from 18  ,  Fatigue severity score 48 from  55  ,  How likely are you to doze in the following situations: 0 = not likely, 1 = slight chance, 2 = moderate chance, 3 = high chance  Sitting and Reading?3 Watching Television?2 Sitting inactive in a public place (theater or meeting)?2 Lying down in the afternoon when circumstances permit?1 Sitting and talking to someone?1 Sitting quietly after lunch without alcohol?2 In a car, while stopped for a few minutes in traffic?1 As a passenger in a car for an hour without a break?2  Total =16 on 06-15-2016     Social History   Social History  . Marital Status: Married    Spouse Name: N/A  . Number of Children: N/A  . Years of Education: N/A   Occupational History  . [employer] at [employer] Part Time    Social History Main Topics  . Smoking status: Current Every Day Smoker -- 1.00 packs/day    Types: Cigarettes  . Smokeless tobacco: Not on file  . Alcohol Use: No  . Drug Use: No  . Sexual Activity: Not on file   Other Topics Concern  . Not on file   Social History Narrative    Family History  Problem Relation Age of Onset  . Cancer Maternal Grandfather     stomach    Past  Medical History  Diagnosis Date  . Acid reflux   . Anxiety   . Depression   . Hypothyroidism   . Hypertension   . Hyperlipidemia   . Testosterone deficiency   . Spondylosis   . Genital warts   . Smoker   . Meningitis   . PAD (peripheral artery disease) ([place])   . Smoker   . Sleep apnea 12/2015    needs BIPAP, AHI 55/hr    Past Surgical History  Procedure Laterality Date  . Knee surgery      right  . Wrist surgery      right and left  . Cholecystectomy  2007  . Nasal septum surgery    . Nasal sinus surgery      Current Outpatient Prescriptions  Medication Sig Dispense Refill  . aspirin 81 MG tablet Take 81 mg by mouth daily.    . baclofen (LIORESAL) 20 MG tablet Take 20 mg by mouth 3 (three) times daily.    Marland Kitchen buPROPion (WELLBUTRIN XL) 150 MG 24 hr tablet Take 450 mg by mouth daily.    . cilostazol (PLETAL) 100 MG tablet Take 100 mg by mouth 2 (two) times daily.    . clonazePAM (KLONOPIN) 1 MG tablet Take 1 mg by mouth 2 (two) times daily as needed for anxiety.    . clopidogrel (PLAVIX) 75 MG tablet Take 75 mg by mouth.    . dexlansoprazole (DEXILANT) 60 MG capsule Take 1 capsule (60 mg total) by mouth daily. 30 capsule 11  . fentaNYL (DURAGESIC - DOSED MCG/HR) 25 MCG/HR patch 25 mcg.  0  . furosemide (LASIX) 40 MG tablet Take 40mg  BID prn 60 tablet 6  . ibuprofen (ADVIL,MOTRIN) 800 MG tablet Take 800 mg by mouth as needed.    . lamoTRIgine (LAMICTAL) 200 MG tablet Take 200 mg by mouth daily.    Marland Kitchen levothyroxine (SYNTHROID, LEVOTHROID) 125 MCG tablet TAKE 1 TABLET BY MOUTH EVERY DAY BEFORE BREAKFAST 90 tablet 3  . losartan-hydrochlorothiazide (HYZAAR) 50-12.5 MG tablet TAKE 1 TABLET BY MOUTH EVERY DAY 90 tablet 3  . morphine (MSIR) 15 MG tablet Take 15 mg by  mouth every 4 (four) hours as needed for severe pain.    . naproxen sodium (ANAPROX) 220 MG tablet Take 220 mg by mouth 2 (two) times daily with a meal.    . niacin (NIASPAN) 500 MG CR tablet   2  . pantoprazole  (PROTONIX) 40 MG tablet TAKE (1) TABLET TWICE DAILY. 60 tablet 11  . potassium chloride (K-DUR) 10 MEQ tablet TAKE 1 TABLET BY MOUTH TWICE DAILY AS NEEDED WITH LASIX. 60 tablet 2  . potassium chloride SA (K-DUR,KLOR-CON) 10 MEQ tablet Take 1 tablet (10 mEq total) by mouth 2 (two) times daily as needed. With lasix 60 tablet 2  . simvastatin (ZOCOR) 40 MG tablet Take 40 mg by mouth daily.    . valACYclovir (VALTREX) 500 MG tablet Take 1 tablet (500 mg total) by mouth daily. 90 tablet 3  . [DISCONTINUED] esomeprazole (NEXIUM) 40 MG capsule Take 40 mg by mouth daily before breakfast.       No current facility-administered medications for this visit.    Allergies as of 06/15/2016 - Review Complete 06/15/2016  Allergen Reaction Noted  . Testosterone  05/19/2011    Vitals: BP 160/80 mmHg  Pulse 88  Resp 20  Ht 6' (1.829 m)  Wt 238 lb (107.956 kg)  BMI 32.27 kg/m2 Last Weight:  Wt Readings from Last 1 Encounters:  06/15/16 238 lb (107.956 kg)   [id number] mass index is 32.27 kg/(m^2).     Last Height:   Ht Readings from Last 1 Encounters:  06/15/16 6' (1.829 m)    Physical exam:  General: The patient is awake, alert and appears not in acute distress. The patient is well groomed. Head: Normocephalic, atraumatic. Neck is supple. Mallampati 4,  neck circumference:17. Nasal airflow unrestricted today , often congested , TMJ is  Not  evident . Retrognathia is seen. Full facial hair.  Cardiovascular:  Regular rate and rhythm , without  murmurs or carotid bruit, and without distended neck veins. Respiratory: Lungs are clear to auscultation. Skin:  Without evidence of edema, or rash Trunk: BMI is elevated   Neurologic exam : The patient is awake and alert, oriented to place and time.   Memory subjective described as impaired - addressed with psychiatry. Attention span & concentration ability appears impaired, he is easily distractible. Speech is hesitant , became more fluent without   dysarthria, dysphonia or aphasia.  Mood and affect are depressed. Montreal Cognitive Assessment  04/18/2016  Visuospatial/ Executive (0/5) 5  Naming (0/3) 3  Attention: Read list of digits (0/2) 1  Attention: Read list of letters (0/1) 1  Attention: Serial 7 subtraction starting at 100 (0/3) 3  Language: Repeat phrase (0/2) 2  Language : Fluency (0/1) 1  Abstraction (0/2) 2  Delayed Recall (0/5) 2  Orientation (0/6) 6  Total 26  Adjusted Score (based on education) 26     Cranial nerves: Pupils are equal and briskly reactive to light. Hearing to finger rub intact.   Facial sensation intact to fine touch.  Facial motor strength is symmetric and tongue and uvula move midline. Shoulder shrug was symmetrical.   Visit duration was 20 minutes for CPAP adapt SV compliance only, no memory testing today.  Greater than 50% of time was spent in counseling and coordination of care. We have discussed the diagnosis and differential and I answered the patient's questions.     Assessment:  After physical and neurologic examination, review of laboratory studies,  Personal review of imaging studies, reports of other /  same  Imaging studies ,  Results of polysomnography/ neurophysiology testing and pre-existing records as far as provided in visit., my assessment is   1) severe apnea, central conversion under simple CPAP- likely related to morphine/ fentanyl  pain therapy .   2) hypersomnia persistent  this could be related to mental health issues because of his cyclic character - alternating  weeks of insomniawith weeks of hypersomnia , and the more rapid changing   cycles are usually a sign of a bipolar underlying disorder.   It would be important to work with a psychiatrist to see if a stabilization can be achieved.   Plan:  Treatment plan and additional workup :  Dear Dr. Dennard Schaumann, I will ask Mr. Delarocha to continue using his ASV, but he is financially strained.  He is significantly depressed and  I believe this affects his Epworth score. I will recommend to see a psychology/ psychiatry regularly. He follows here for sleep care in July  2018 and thereafter yearly.   Asencion Partridge Odis Turck MD  06/15/2016   CC: Susy Frizzle, [address] [place], [place] [address]

## 2016-07-11 ENCOUNTER — Other Ambulatory Visit (HOSPITAL_COMMUNITY): Payer: Self-pay | Admitting: Specialist

## 2016-07-11 DIAGNOSIS — M25561 Pain in right knee: Secondary | ICD-10-CM

## 2016-07-21 ENCOUNTER — Ambulatory Visit (HOSPITAL_COMMUNITY): Payer: Medicare Other

## 2016-08-11 ENCOUNTER — Other Ambulatory Visit: Payer: Self-pay | Admitting: Family Medicine

## 2016-08-27 DIAGNOSIS — E222 Syndrome of inappropriate secretion of antidiuretic hormone: Secondary | ICD-10-CM

## 2016-08-27 HISTORY — DX: Syndrome of inappropriate secretion of antidiuretic hormone: E22.2

## 2016-09-01 ENCOUNTER — Other Ambulatory Visit: Payer: Self-pay | Admitting: Family Medicine

## 2016-09-01 MED ORDER — LEVOTHYROXINE SODIUM 125 MCG PO TABS: ORAL_TABLET | ORAL | 0 refills | Status: DC

## 2016-09-01 NOTE — Telephone Encounter (Signed)
Medication called/sent to requested pharmacy and pt will require ov and labs before further refills.

## 2016-09-11 ENCOUNTER — Other Ambulatory Visit: Payer: Medicare Other

## 2016-09-11 ENCOUNTER — Other Ambulatory Visit: Payer: Self-pay | Admitting: Family Medicine

## 2016-09-11 DIAGNOSIS — Z79899 Other long term (current) drug therapy: Secondary | ICD-10-CM

## 2016-09-11 DIAGNOSIS — I10 Essential (primary) hypertension: Secondary | ICD-10-CM

## 2016-09-11 DIAGNOSIS — F419 Anxiety disorder, unspecified: Secondary | ICD-10-CM

## 2016-09-11 DIAGNOSIS — E039 Hypothyroidism, unspecified: Secondary | ICD-10-CM

## 2016-09-11 DIAGNOSIS — E785 Hyperlipidemia, unspecified: Secondary | ICD-10-CM

## 2016-09-13 ENCOUNTER — Other Ambulatory Visit: Payer: Medicare Other

## 2016-09-13 DIAGNOSIS — Z79899 Other long term (current) drug therapy: Secondary | ICD-10-CM

## 2016-09-13 DIAGNOSIS — F419 Anxiety disorder, unspecified: Secondary | ICD-10-CM

## 2016-09-13 DIAGNOSIS — I10 Essential (primary) hypertension: Secondary | ICD-10-CM

## 2016-09-13 DIAGNOSIS — E039 Hypothyroidism, unspecified: Secondary | ICD-10-CM

## 2016-09-13 DIAGNOSIS — E785 Hyperlipidemia, unspecified: Secondary | ICD-10-CM

## 2016-09-13 LAB — CBC WITH DIFFERENTIAL/PLATELET
BASOS ABS: 0 {cells}/uL (ref 0–200)
Basophils Relative: 0 %
EOS PCT: 1 %
Eosinophils Absolute: 100 cells/uL (ref 15–500)
HEMATOCRIT: 32.4 % — AB (ref 38.5–50.0)
HEMOGLOBIN: 11.2 g/dL — AB (ref 13.0–17.0)
LYMPHS ABS: 2500 {cells}/uL (ref 850–3900)
Lymphocytes Relative: 25 %
MCH: 29.2 pg (ref 27.0–33.0)
MCHC: 34.6 g/dL (ref 32.0–36.0)
MCV: 84.4 fL (ref 80.0–100.0)
MONO ABS: 1200 {cells}/uL — AB (ref 200–950)
MPV: 8 fL (ref 7.5–12.5)
Monocytes Relative: 12 %
NEUTROS PCT: 62 %
Neutro Abs: 6200 cells/uL (ref 1500–7800)
Platelets: 214 10*3/uL (ref 140–400)
RBC: 3.84 MIL/uL — ABNORMAL LOW (ref 4.20–5.80)
RDW: 14 % (ref 11.0–15.0)
WBC: 10 10*3/uL (ref 3.8–10.8)

## 2016-09-13 LAB — LIPID PANEL
Cholesterol: 152 mg/dL (ref 125–200)
HDL: 46 mg/dL (ref >=40)
LDL CALC: 78 mg/dL (ref <130)
TRIGLYCERIDES: 138 mg/dL (ref <150)
Total CHOL/HDL Ratio: 3.3 Ratio (ref <=5.0)
VLDL: 28 mg/dL (ref <30)

## 2016-09-13 LAB — COMPLETE METABOLIC PANEL WITH GFR
ALBUMIN: 3.6 g/dL (ref 3.6–5.1)
ALK PHOS: 108 U/L (ref 40–115)
ALT: 5 U/L — AB (ref 9–46)
AST: 13 U/L (ref 10–40)
BILIRUBIN TOTAL: 0.4 mg/dL (ref 0.2–1.2)
BUN: 3 mg/dL — AB (ref 7–25)
CO2: 31 mmol/L (ref 20–31)
Calcium: 8.9 mg/dL (ref 8.6–10.3)
Chloride: 89 mmol/L — ABNORMAL LOW (ref 98–110)
Creat: 0.64 mg/dL (ref 0.60–1.35)
GFR, Est African American: >89 mL/min (ref >=60)
GFR, Est Non African American: >89 mL/min (ref >=60)
GLUCOSE: 83 mg/dL (ref 70–99)
Potassium: 4 mmol/L (ref 3.5–5.3)
SODIUM: 123 mmol/L — AB (ref 135–146)
TOTAL PROTEIN: 5.4 g/dL — AB (ref 6.1–8.1)

## 2016-09-13 LAB — TSH: TSH: 1.67 mIU/L (ref 0.40–4.50)

## 2016-09-15 ENCOUNTER — Ambulatory Visit (HOSPITAL_COMMUNITY)
Admission: RE | Admit: 2016-09-15 | Discharge: 2016-09-15 | Disposition: A | Discharge status: Home / Self Care | Payer: Medicare Other | Source: Ambulatory Visit | Attending: Vascular Surgery | Admitting: Vascular Surgery

## 2016-09-15 ENCOUNTER — Telehealth: Payer: Self-pay | Admitting: Family Medicine

## 2016-09-15 ENCOUNTER — Encounter: Payer: Self-pay | Admitting: Family Medicine

## 2016-09-15 ENCOUNTER — Ambulatory Visit (INDEPENDENT_AMBULATORY_CARE_PROVIDER_SITE_OTHER): Payer: Medicare Other | Admitting: Family Medicine

## 2016-09-15 VITALS — BP 130/60 | HR 120 | Temp 98.6°F | Resp 18 | Ht 72.0 in | Wt 257.0 lb

## 2016-09-15 DIAGNOSIS — E871 Hypo-osmolality and hyponatremia: Secondary | ICD-10-CM

## 2016-09-15 DIAGNOSIS — R000 Tachycardia, unspecified: Secondary | ICD-10-CM | POA: Diagnosis not present

## 2016-09-15 DIAGNOSIS — R609 Edema, unspecified: Secondary | ICD-10-CM

## 2016-09-15 MED ORDER — FUROSEMIDE 40 MG PO TABS: ORAL_TABLET | ORAL | 6 refills | Status: DC

## 2016-09-15 MED ORDER — METOPROLOL TARTRATE 25 MG PO TABS: 25.0000 mg | ORAL_TABLET | Freq: Two times a day (BID) | ORAL | 3 refills | Status: DC

## 2016-09-15 NOTE — Telephone Encounter (Signed)
Patient returned call and made aware.

## 2016-09-15 NOTE — Telephone Encounter (Signed)
[facility] called to report NO DVT in pt's legs.  Per provider, let pt know and tell him to follow the instructions given to him at office visit this morning.  I tried to call left message on voice mail.

## 2016-09-15 NOTE — Progress Notes (Signed)
Subjective:    Patient ID: Tony Campos, male    DOB: [birth year], 50 y.o.   MRN: [id number]  HPI Wt Readings from Last 3 Encounters:  09/15/16 257 lb (116.6 kg)  06/15/16 238 lb (108 kg)  04/18/16 241 lb (109.3 kg)   Patient states that his legs up and swelling significantly over the last few months. Of note his weight is up almost 20 pounds since his last weight check in July area and he has +2 pitting edema in both legs distal to his knee. He also has significant tachycardia date 120 bpm. Although not listed on his medicine list, apparently he is taking a stimulant prescribed by psychiatrist. He states that he is taking this to help him stay awake. He is not exactly sure the name that he believes it's "dextro-something".  I am assuming he is talking about some type of amphetamine. I recommended that he stop that immediately given the tachycardia. Of note on lab work procedure yesterday, he has significant hyponatremia.  Appointment on 09/13/2016  Component Date Value Ref Range Status  . Sodium 09/13/2016 123* 135 - 146 mmol/L Final  . Potassium 09/13/2016 4.0  3.5 - 5.3 mmol/L Final  . Chloride 09/13/2016 89* 98 - 110 mmol/L Final  . CO2 09/13/2016 31  20 - 31 mmol/L Final  . Glucose, Bld 09/13/2016 83  70 - 99 mg/dL Final  . BUN 09/13/2016 3* 7 - 25 mg/dL Final  . Creat 09/13/2016 0.64  0.60 - 1.35 mg/dL Final  . Total Bilirubin 09/13/2016 0.4  0.2 - 1.2 mg/dL Final  . Alkaline Phosphatase 09/13/2016 108  40 - 115 U/L Final  . AST 09/13/2016 13  10 - 40 U/L Final  . ALT 09/13/2016 5* 9 - 46 U/L Final  . Total Protein 09/13/2016 5.4* 6.1 - 8.1 g/dL Final  . Albumin 09/13/2016 3.6  3.6 - 5.1 g/dL Final  . Calcium 09/13/2016 8.9  8.6 - 10.3 mg/dL Final  . GFR, Est African American 09/13/2016 >89  >=60 mL/min Final  . GFR, Est Non African American 09/13/2016 >89  >=60 mL/min Final  . TSH 09/13/2016 1.67  0.40 - 4.50 mIU/L Final  . Cholesterol 09/13/2016 152  125 - 200 mg/dL Final    . Triglycerides 09/13/2016 138  <150 mg/dL Final  . HDL 09/13/2016 46  >=40 mg/dL Final  . Total CHOL/HDL Ratio 09/13/2016 3.3  <=5.0 Ratio Final  . VLDL 09/13/2016 28  <30 mg/dL Final  . LDL Cholesterol 09/13/2016 78  <130 mg/dL Final   Comment:   Total Cholesterol/HDL Ratio:CHD Risk                        Coronary Heart Disease Risk Table                                        Men       Women          1/2 Average Risk              3.4        3.3              Average Risk              5.0        4.4  2X Average Risk              9.6        7.1           3X Average Risk             23.4       11.0 Use the calculated Patient Ratio above and the CHD Risk table  to determine the patient's CHD Risk.   . WBC 09/13/2016 10.0  3.8 - 10.8 K/uL Final  . RBC 09/13/2016 3.84* 4.20 - 5.80 MIL/uL Final  . Hemoglobin 09/13/2016 11.2* 13.0 - 17.0 g/dL Final  . HCT 09/13/2016 32.4* 38.5 - 50.0 % Final  . MCV 09/13/2016 84.4  80.0 - 100.0 fL Final  . MCH 09/13/2016 29.2  27.0 - 33.0 pg Final  . MCHC 09/13/2016 34.6  32.0 - 36.0 g/dL Final  . RDW 09/13/2016 14.0  11.0 - 15.0 % Final  . Platelets 09/13/2016 214  140 - 400 K/uL Final  . MPV 09/13/2016 8.0  7.5 - 12.5 fL Final  . Neutro Abs 09/13/2016 6200  1,500 - 7,800 cells/uL Final  . Lymphs Abs 09/13/2016 2500  850 - 3,900 cells/uL Final  . Monocytes Absolute 09/13/2016 1200* 200 - 950 cells/uL Final  . Eosinophils Absolute 09/13/2016 100  15 - 500 cells/uL Final  . Basophils Absolute 09/13/2016 0  0 - 200 cells/uL Final  . Neutrophils Relative % 09/13/2016 62  % Final  . Lymphocytes Relative 09/13/2016 25  % Final  . Monocytes Relative 09/13/2016 12  % Final  . Eosinophils Relative 09/13/2016 1  % Final  . Basophils Relative 09/13/2016 0  % Final  . Smear Review 09/13/2016 Criteria for review not met   Final   Past Medical History:  Diagnosis Date  . Acid reflux   . Anxiety   . Depression   . Genital warts   . Hyperlipidemia    . Hypertension   . Hypothyroidism   . Meningitis   . PAD (peripheral artery disease) ([place])   . Sleep apnea 12/2015   needs BIPAP, AHI 55/hr  . Smoker   . Smoker   . Spondylosis   . Testosterone deficiency    Past Surgical History:  Procedure Laterality Date  . CHOLECYSTECTOMY  2007  . KNEE SURGERY     right  . NASAL SEPTUM SURGERY    . NASAL SINUS SURGERY    . WRIST SURGERY     right and left   Current Outpatient Prescriptions on File Prior to Visit  Medication Sig Dispense Refill  . aspirin 81 MG tablet Take 81 mg by mouth daily.    . baclofen (LIORESAL) 20 MG tablet Take 20 mg by mouth 3 (three) times daily.    Marland Kitchen buPROPion (WELLBUTRIN XL) 150 MG 24 hr tablet Take 450 mg by mouth daily.    . cilostazol (PLETAL) 100 MG tablet Take 100 mg by mouth 2 (two) times daily.    . clonazePAM (KLONOPIN) 1 MG tablet Take 1 mg by mouth 2 (two) times daily as needed for anxiety.    . clopidogrel (PLAVIX) 75 MG tablet Take 75 mg by mouth.    . fentaNYL (DURAGESIC - DOSED MCG/HR) 25 MCG/HR patch 50 mcg.   0  . furosemide (LASIX) 40 MG tablet Take 68m BID prn 60 tablet 6  . lamoTRIgine (LAMICTAL) 200 MG tablet Take 200 mg by mouth daily.    .Marland Kitchenlevothyroxine (SYNTHROID, LEVOTHROID) 125 MCG tablet  TAKE 1 TABLET BY MOUTH EVERY DAY BEFORE BREAKFAST 90 tablet 0  . losartan-hydrochlorothiazide (HYZAAR) 50-12.5 MG tablet TAKE (1) TABLET BY MOUTH ONCE DAILY. 30 tablet 0  . morphine (MSIR) 15 MG tablet Take 15 mg by mouth every 4 (four) hours as needed for severe pain.    . niacin (NIASPAN) 500 MG CR tablet   2  . pantoprazole (PROTONIX) 40 MG tablet TAKE (1) TABLET TWICE DAILY. 60 tablet 11  . potassium chloride (K-DUR) 10 MEQ tablet TAKE 1 TABLET BY MOUTH TWICE DAILY AS NEEDED WITH LASIX. 60 tablet 2  . simvastatin (ZOCOR) 40 MG tablet Take 40 mg by mouth daily.    . valACYclovir (VALTREX) 500 MG tablet Take 1 tablet (500 mg total) by mouth daily. 90 tablet 3  . [DISCONTINUED] esomeprazole (NEXIUM)  40 MG capsule Take 40 mg by mouth daily before breakfast.       No current facility-administered medications on file prior to visit.    Allergies  Allergen Reactions  . Testosterone     Edema from topical applications   Social History   Social History  . Marital status: Married    Spouse name: N/A  . Number of children: N/A  . Years of education: N/A   Occupational History  . [employer] at [employer] Part Time    Social History Main Topics  . Smoking status: Current Every Day Smoker    Packs/day: 1.00    Types: Cigarettes  . Smokeless tobacco: Not on file  . Alcohol use No  . Drug use: No  . Sexual activity: Not on file   Other Topics Concern  . Not on file   Social History Narrative  . No narrative on file      Review of Systems  All other systems reviewed and are negative.      Objective:   Physical Exam  Constitutional: He appears well-developed and well-nourished.  Neck: Neck supple. No JVD present.  Cardiovascular: Regular rhythm.  Tachycardia present.   No murmur heard. Pulmonary/Chest: Effort normal and breath sounds normal. No respiratory distress. He has no wheezes. He has no rales.  Abdominal: Soft. Bowel sounds are normal. He exhibits no distension. There is no tenderness. There is no rebound and no guarding.  Musculoskeletal: He exhibits edema.  Vitals reviewed.         Assessment & Plan:  Rapid heart beat - Plan: EKG 12-Lead  Tachycardia - Plan: VAS [place] LOWER EXTREMITY VENOUS (DVT), metoprolol tartrate (LOPRESSOR) 25 MG tablet  Edema, unspecified type - Plan: VAS [place] LOWER EXTREMITY VENOUS (DVT)  Hyponatremia   Given his significant edema and tachycardia, I believe he needs venous ultrasounds to rule out DVT/PE. I recommended that he discontinue his stimulant medicine immediately. Patient needs diuresis. Therefore I'm going to have him discontinue Hyzaar and increase Lasix 80 mg by mouth BID over the weekend and recheck on Monday.  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 I recommended less than 1200 mL of fluid per day. Meanwhile I will start the patient on Lopressor 25 mg by mouth twice a day to help address the tachycardia and recheck on Monday.  Also I had a discussion with the patient. He seems overmedicated. His affect is inappropriate. As I explained to the patient, he seems "high". I believe he is taking entirely too much pain medication. I do not believe that he is safe to drive. I  recommended that he avoid driving and discuss with his pain clinic reducing his pain medication.

## 2016-09-19 ENCOUNTER — Encounter: Payer: Self-pay | Admitting: Family Medicine

## 2016-09-19 ENCOUNTER — Ambulatory Visit (INDEPENDENT_AMBULATORY_CARE_PROVIDER_SITE_OTHER): Payer: Medicare Other | Admitting: Family Medicine

## 2016-09-19 VITALS — BP 156/90 | HR 98 | Temp 98.3°F | Resp 18 | Ht 72.0 in | Wt 252.0 lb

## 2016-09-19 DIAGNOSIS — E871 Hypo-osmolality and hyponatremia: Secondary | ICD-10-CM

## 2016-09-19 LAB — BASIC METABOLIC PANEL WITH GFR
BUN: 6 mg/dL — AB (ref 7–25)
CO2: 31 mmol/L (ref 20–31)
CREATININE: 0.85 mg/dL (ref 0.60–1.35)
Calcium: 9.4 mg/dL (ref 8.6–10.3)
Chloride: 97 mmol/L — ABNORMAL LOW (ref 98–110)
GFR, Est Non African American: >89 mL/min (ref >=60)
Glucose, Bld: 96 mg/dL (ref 70–99)
POTASSIUM: 4.1 mmol/L (ref 3.5–5.3)
SODIUM: 136 mmol/L (ref 135–146)

## 2016-09-19 NOTE — Progress Notes (Signed)
Subjective:    Patient ID: Tony Campos, male    DOB: July 11, 1966, 50 y.o.   MRN: [id number]  HPI Wt Readings from Last 3 Encounters:  09/19/16 252 lb (114.3 kg)  09/15/16 257 lb (116.6 kg)  06/15/16 238 lb (108 kg)   Patient states that his legs up and swelling significantly over the last few months. Of note his weight is up almost 20 pounds since his last weight check in July area and he has +2 pitting edema in both legs distal to his knee. He also has significant tachycardia date 120 bpm. Although not listed on his medicine list, apparently he is taking a stimulant prescribed by psychiatrist. He states that he is taking this to help him stay awake. He is not exactly sure the name that he believes it's "dextro-something".  I am assuming he is talking about some type of amphetamine. I recommended that he stop that immediately given the tachycardia. Of note on lab work procedure yesterday, he has significant hyponatremia.  At that time, my plan was: Given his significant edema and tachycardia, I believe he needs venous ultrasounds to rule out DVT/PE. I recommended that he discontinue his stimulant medicine immediately. Patient needs diuresis. Therefore I'm going to have him discontinue Hyzaar and increase Lasix 80 mg by mouth BID over the weekend and recheck on Monday. 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 I recommended less than 1200 mL of fluid per day. Meanwhile I will start the patient on Lopressor 25 mg by mouth twice a day to help address the tachycardia and recheck on Monday.  Also I had a discussion with the patient. He seems overmedicated. His affect is inappropriate. As I explained to the patient, he seems "high". I believe he is taking entirely too much pain medication. I do not believe that he is safe to drive. I recommended that he avoid driving and discuss with his pain clinic reducing his  pain medication.  Appointment on 09/13/2016  Component Date Value Ref Range Status  . Sodium 09/13/2016 123* 135 - 146 mmol/L Final  . Potassium 09/13/2016 4.0  3.5 - 5.3 mmol/L Final  . Chloride 09/13/2016 89* 98 - 110 mmol/L Final  . CO2 09/13/2016 31  20 - 31 mmol/L Final  . Glucose, Bld 09/13/2016 83  70 - 99 mg/dL Final  . BUN 09/13/2016 3* 7 - 25 mg/dL Final  . Creat 09/13/2016 0.64  0.60 - 1.35 mg/dL Final  . Total Bilirubin 09/13/2016 0.4  0.2 - 1.2 mg/dL Final  . Alkaline Phosphatase 09/13/2016 108  40 - 115 U/L Final  . AST 09/13/2016 13  10 - 40 U/L Final  . ALT 09/13/2016 5* 9 - 46 U/L Final  . Total Protein 09/13/2016 5.4* 6.1 - 8.1 g/dL Final  . Albumin 09/13/2016 3.6  3.6 - 5.1 g/dL Final  . Calcium 09/13/2016 8.9  8.6 - 10.3 mg/dL Final  . GFR, Est African American 09/13/2016 >89  >=60 mL/min Final  . GFR, Est Non African American 09/13/2016 >89  >=60 mL/min Final  . TSH 09/13/2016 1.67  0.40 - 4.50 mIU/L Final  . Cholesterol 09/13/2016 152  125 - 200 mg/dL Final  . Triglycerides 09/13/2016 138  <150 mg/dL Final  . HDL 09/13/2016 46  >=40 mg/dL Final  . Total CHOL/HDL Ratio 09/13/2016 3.3  <=5.0 Ratio Final  . VLDL 09/13/2016 28  <30 mg/dL Final  . LDL  Cholesterol 09/13/2016 78  <130 mg/dL Final   Comment:   Total Cholesterol/HDL Ratio:CHD Risk                        Coronary Heart Disease Risk Table                                        Men       Women          1/2 Average Risk              3.4        3.3              Average Risk              5.0        4.4           2X Average Risk              9.6        7.1           3X Average Risk             23.4       11.0 Use the calculated Patient Ratio above and the CHD Risk table  to determine the patient's CHD Risk.   . WBC 09/13/2016 10.0  3.8 - 10.8 K/uL Final  . RBC 09/13/2016 3.84* 4.20 - 5.80 MIL/uL Final  . Hemoglobin 09/13/2016 11.2* 13.0 - 17.0 g/dL Final  . HCT 09/13/2016 32.4* 38.5 - 50.0 % Final  .  MCV 09/13/2016 84.4  80.0 - 100.0 fL Final  . MCH 09/13/2016 29.2  27.0 - 33.0 pg Final  . MCHC 09/13/2016 34.6  32.0 - 36.0 g/dL Final  . RDW 09/13/2016 14.0  11.0 - 15.0 % Final  . Platelets 09/13/2016 214  140 - 400 K/uL Final  . MPV 09/13/2016 8.0  7.5 - 12.5 fL Final  . Neutro Abs 09/13/2016 6200  1,500 - 7,800 cells/uL Final  . Lymphs Abs 09/13/2016 2500  850 - 3,900 cells/uL Final  . Monocytes Absolute 09/13/2016 1200* 200 - 950 cells/uL Final  . Eosinophils Absolute 09/13/2016 100  15 - 500 cells/uL Final  . Basophils Absolute 09/13/2016 0  0 - 200 cells/uL Final  . Neutrophils Relative % 09/13/2016 62  % Final  . Lymphocytes Relative 09/13/2016 25  % Final  . Monocytes Relative 09/13/2016 12  % Final  . Eosinophils Relative 09/13/2016 1  % Final  . Basophils Relative 09/13/2016 0  % Final  . Smear Review 09/13/2016 Criteria for review not met   Final  09/19/16 Here for follow up.  He has lost 5 pounds since when I last saw him although he still has +1 pitting edema in both legs distal to the knee. His mental status has improved as he is been restricting his fluids as I recommended. His blood pressure is elevated but he's only been taking Lopressor once a day Past Medical History:  Diagnosis Date  . Acid reflux   . Anxiety   . Depression   . Genital warts   . Hyperlipidemia   . Hypertension   . Hypothyroidism   . Meningitis   . PAD (peripheral artery disease) ([place])   . Sleep apnea 12/2015   needs BIPAP, AHI 55/hr  . Smoker   .  Smoker   . Spondylosis   . Testosterone deficiency    Past Surgical History:  Procedure Laterality Date  . CHOLECYSTECTOMY  2007  . KNEE SURGERY     right  . NASAL SEPTUM SURGERY    . NASAL SINUS SURGERY    . WRIST SURGERY     right and left   Current Outpatient Prescriptions on File Prior to Visit  Medication Sig Dispense Refill  . aspirin 81 MG tablet Take 81 mg by mouth daily.    . baclofen (LIORESAL) 20 MG tablet Take 20 mg by mouth  3 (three) times daily.    Marland Kitchen buPROPion (WELLBUTRIN XL) 150 MG 24 hr tablet Take 450 mg by mouth daily.    . cilostazol (PLETAL) 100 MG tablet Take 100 mg by mouth 2 (two) times daily.    . clonazePAM (KLONOPIN) 1 MG tablet Take 1 mg by mouth 2 (two) times daily as needed for anxiety.    . clopidogrel (PLAVIX) 75 MG tablet Take 75 mg by mouth.    . fentaNYL (DURAGESIC - DOSED MCG/HR) 25 MCG/HR patch 50 mcg.   0  . furosemide (LASIX) 40 MG tablet Take 60m BID prn 60 tablet 6  . lamoTRIgine (LAMICTAL) 200 MG tablet Take 200 mg by mouth daily.    .Marland Kitchenlevothyroxine (SYNTHROID, LEVOTHROID) 125 MCG tablet TAKE 1 TABLET BY MOUTH EVERY DAY BEFORE BREAKFAST 90 tablet 0  . losartan-hydrochlorothiazide (HYZAAR) 50-12.5 MG tablet TAKE (1) TABLET BY MOUTH ONCE DAILY. 30 tablet 0  . metoprolol tartrate (LOPRESSOR) 25 MG tablet Take 1 tablet (25 mg total) by mouth 2 (two) times daily. 60 tablet 3  . morphine (MSIR) 15 MG tablet Take 15 mg by mouth every 4 (four) hours as needed for severe pain.    . niacin (NIASPAN) 500 MG CR tablet   2  . pantoprazole (PROTONIX) 40 MG tablet TAKE (1) TABLET TWICE DAILY. 60 tablet 11  . potassium chloride (K-DUR) 10 MEQ tablet TAKE 1 TABLET BY MOUTH TWICE DAILY AS NEEDED WITH LASIX. 60 tablet 2  . simvastatin (ZOCOR) 40 MG tablet Take 40 mg by mouth daily.    . valACYclovir (VALTREX) 500 MG tablet Take 1 tablet (500 mg total) by mouth daily. 90 tablet 3  . [DISCONTINUED] esomeprazole (NEXIUM) 40 MG capsule Take 40 mg by mouth daily before breakfast.       No current facility-administered medications on file prior to visit.    Allergies  Allergen Reactions  . Testosterone     Edema from topical applications   Social History   Social History  . Marital status: Married    Spouse name: N/A  . Number of children: N/A  . Years of education: N/A   Occupational History  . [employer]    Social History Main Topics  . Smoking status: Current  Every Day Smoker    Packs/day: 1.00    Types: Cigarettes  . Smokeless tobacco: Not on file  . Alcohol use No  . Drug use: No  . Sexual activity: Not on file   Other Topics Concern  . Not on file   Social History Narrative  . No narrative on file      Review of Systems  All other systems reviewed and are negative.      Objective:   Physical Exam  Constitutional: He appears well-developed and well-nourished.  Neck: Neck supple. No JVD present.  Cardiovascular: Regular rhythm.  Tachycardia present.  No murmur heard. Pulmonary/Chest: Effort normal and breath sounds normal. No respiratory distress. He has no wheezes. He has no rales.  Abdominal: Soft. Bowel sounds are normal. He exhibits no distension. There is no tenderness. There is no rebound and no guarding.  Musculoskeletal: He exhibits edema.  Vitals reviewed.         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 We discussed this in detail. I want him to increase Lopressor to twice a day and then recheck his blood pressure on Monday. Continue Lasix 80 mg twice daily for the present time. Recheck a BMP to monitor his renal function, potassium, and his sodium given the diuresis and fluid restriction. I believe once we achieve adequate diuresis, hopefully we'll be able to maintain his weight on a lower dose of Lasix such as 40 mg twice a day if he will adhere to a 1200 mL per day fluid restriction due to his SIADH. Recheck on Monday

## 2016-09-25 ENCOUNTER — Ambulatory Visit: Payer: Medicare Other | Admitting: Family Medicine

## 2016-09-29 ENCOUNTER — Encounter: Payer: Self-pay | Admitting: Family Medicine

## 2016-09-29 ENCOUNTER — Ambulatory Visit (INDEPENDENT_AMBULATORY_CARE_PROVIDER_SITE_OTHER): Payer: Medicare Other | Admitting: Family Medicine

## 2016-09-29 VITALS — BP 170/84 | HR 80 | Temp 98.1°F | Resp 18 | Ht 72.0 in | Wt 238.0 lb

## 2016-09-29 DIAGNOSIS — E871 Hypo-osmolality and hyponatremia: Secondary | ICD-10-CM | POA: Diagnosis not present

## 2016-09-29 DIAGNOSIS — E222 Syndrome of inappropriate secretion of antidiuretic hormone: Secondary | ICD-10-CM | POA: Diagnosis not present

## 2016-09-29 DIAGNOSIS — I10 Essential (primary) hypertension: Secondary | ICD-10-CM

## 2016-09-29 LAB — BASIC METABOLIC PANEL WITH GFR
BUN: 9 mg/dL (ref 7–25)
CALCIUM: 9.3 mg/dL (ref 8.6–10.3)
CO2: 25 mmol/L (ref 20–31)
CREATININE: 0.96 mg/dL (ref 0.60–1.35)
Chloride: 102 mmol/L (ref 98–110)
GFR, Est Non African American: >89 mL/min (ref >=60)
GLUCOSE: 122 mg/dL — AB (ref 70–99)
Potassium: 4.2 mmol/L (ref 3.5–5.3)
Sodium: 140 mmol/L (ref 135–146)

## 2016-09-29 MED ORDER — LOSARTAN POTASSIUM 50 MG PO TABS: 50.0000 mg | ORAL_TABLET | Freq: Every day | ORAL | 3 refills | Status: DC

## 2016-09-29 MED ORDER — POTASSIUM CHLORIDE ER 10 MEQ PO TBCR: EXTENDED_RELEASE_TABLET | ORAL | 2 refills | Status: DC

## 2016-09-29 NOTE — Progress Notes (Signed)
Subjective:    Patient ID: Tony Campos, male    DOB: Jan 26, 1966, 50 y.o.   MRN: [id number]  HPI Wt Readings from Last 3 Encounters:  09/19/16 252 lb (114.3 kg)  09/15/16 257 lb (116.6 kg)  06/15/16 238 lb (108 kg)   Patient states that his legs up and swelling significantly over the last few months. Of note his weight is up almost 20 pounds since his last weight check in July area and he has +2 pitting edema in both legs distal to his knee. He also has significant tachycardia date 120 bpm. Although not listed on his medicine list, apparently he is taking a stimulant prescribed by psychiatrist. He states that he is taking this to help him stay awake. He is not exactly sure the name that he believes it's "dextro-something".  I am assuming he is talking about some type of amphetamine. I recommended that he stop that immediately given the tachycardia. Of note on lab work procedure yesterday, he has significant hyponatremia.  At that time, my plan was: Given his significant edema and tachycardia, I believe he needs venous ultrasounds to rule out DVT/PE. I recommended that he discontinue his stimulant medicine immediately. Patient needs diuresis. Therefore I'm going to have him discontinue Hyzaar and increase Lasix 80 mg by mouth BID over the weekend and recheck on Monday. 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 I recommended less than 1200 mL of fluid per day. Meanwhile I will start the patient on Lopressor 25 mg by mouth twice a day to help address the tachycardia and recheck on Monday.  Also I had a discussion with the patient. He seems overmedicated. His affect is inappropriate. As I explained to the patient, he seems "high". I believe he is taking entirely too much pain medication. I do not believe that he is safe to drive. I recommended that he avoid driving and discuss with his pain clinic reducing his  pain medication.  No visits with results within 1 Week(s) from this visit.  Latest known visit with results is:  Office Visit on 09/19/2016  Component Date Value Ref Range Status  . Sodium 09/19/2016 136  135 - 146 mmol/L Final  . Potassium 09/19/2016 4.1  3.5 - 5.3 mmol/L Final  . Chloride 09/19/2016 97* 98 - 110 mmol/L Final  . CO2 09/19/2016 31  20 - 31 mmol/L Final  . Glucose, Bld 09/19/2016 96  70 - 99 mg/dL Final  . BUN 09/19/2016 6* 7 - 25 mg/dL Final  . Creat 09/19/2016 0.85  0.60 - 1.35 mg/dL Final  . Calcium 09/19/2016 9.4  8.6 - 10.3 mg/dL Final  . GFR, Est African American 09/19/2016 >89  >=60 mL/min Final  . GFR, Est Non African American 09/19/2016 >89  >=60 mL/min Final  09/19/16 Here for follow up.  He has lost 5 pounds since when I last saw him although he still has +1 pitting edema in both legs distal to the knee. His mental status has improved as he is been restricting his fluids as I recommended. His blood pressure is elevated but he's only been taking Lopressor once a day.  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 We discussed this in detail. I want him to increase Lopressor to twice a day and then recheck his blood pressure on Monday. Continue Lasix 80 mg twice daily for the present time. Recheck a BMP to  monitor his renal function, potassium, and his sodium given the diuresis and fluid restriction. I believe once we achieve adequate diuresis, hopefully we'll be able to maintain his weight on a lower dose of Lasix such as 40 mg twice a day if he will adhere to a 1200 mL per day fluid restriction due to his SIADH. Recheck on Monday  09/29/16 DNKA on Monday due to oversleeping.  Scheduled for today. He has now achieved his dry weight. The patient was 238 pounds in June. He is 237 pounds today. Therefore he is diuresed 14 additional pounds over the last week. I definitely believe that he is at his appropriate dry weight. There is no pitting edema in his legs  today. In fact, I'm concerned about possible dehydration. His blood pressure is elevated however he has not been on losartan hydrochlorothiazide due to the fact he is on high-dose Lasix. He has not been taking his potassium  Past Medical History:  Diagnosis Date  . Acid reflux   . Anxiety   . Depression   . Genital warts   . Hyperlipidemia   . Hypertension   . Hypothyroidism   . Meningitis   . PAD (peripheral artery disease) ([place])   . Sleep apnea 12/2015   needs BIPAP, AHI 55/hr  . Smoker   . Smoker   . Spondylosis   . Testosterone deficiency    Past Surgical History:  Procedure Laterality Date  . CHOLECYSTECTOMY  2007  . KNEE SURGERY     right  . NASAL SEPTUM SURGERY    . NASAL SINUS SURGERY    . WRIST SURGERY     right and left   Current Outpatient Prescriptions on File Prior to Visit  Medication Sig Dispense Refill  . aspirin 81 MG tablet Take 81 mg by mouth daily.    . baclofen (LIORESAL) 20 MG tablet Take 20 mg by mouth 3 (three) times daily.    Marland Kitchen buPROPion (WELLBUTRIN XL) 150 MG 24 hr tablet Take 450 mg by mouth daily.    . cilostazol (PLETAL) 100 MG tablet Take 100 mg by mouth 2 (two) times daily.    . clonazePAM (KLONOPIN) 1 MG tablet Take 1 mg by mouth 2 (two) times daily as needed for anxiety.    . clopidogrel (PLAVIX) 75 MG tablet Take 75 mg by mouth.    . fentaNYL (DURAGESIC - DOSED MCG/HR) 25 MCG/HR patch 50 mcg.   0  . furosemide (LASIX) 40 MG tablet Take 40mg  BID prn 60 tablet 6  . lamoTRIgine (LAMICTAL) 200 MG tablet Take 200 mg by mouth daily.    Marland Kitchen levothyroxine (SYNTHROID, LEVOTHROID) 125 MCG tablet TAKE 1 TABLET BY MOUTH EVERY DAY BEFORE BREAKFAST 90 tablet 0  . losartan-hydrochlorothiazide (HYZAAR) 50-12.5 MG tablet TAKE (1) TABLET BY MOUTH ONCE DAILY. 30 tablet 0  . metoprolol tartrate (LOPRESSOR) 25 MG tablet Take 1 tablet (25 mg total) by mouth 2 (two) times daily. 60 tablet 3  . morphine (MSIR) 15 MG tablet Take 15 mg by mouth every 4 (four) hours as  needed for severe pain.    . niacin (NIASPAN) 500 MG CR tablet   2  . pantoprazole (PROTONIX) 40 MG tablet TAKE (1) TABLET TWICE DAILY. 60 tablet 11  . potassium chloride (K-DUR) 10 MEQ tablet TAKE 1 TABLET BY MOUTH TWICE DAILY AS NEEDED WITH LASIX. 60 tablet 2  . simvastatin (ZOCOR) 40 MG tablet Take 40 mg by mouth daily.    . valACYclovir (VALTREX) 500  MG tablet Take 1 tablet (500 mg total) by mouth daily. 90 tablet 3  . [DISCONTINUED] esomeprazole (NEXIUM) 40 MG capsule Take 40 mg by mouth daily before breakfast.       No current facility-administered medications on file prior to visit.    Allergies  Allergen Reactions  . Testosterone     Edema from topical applications   Social History   Social History  . Marital status: Married    Spouse name: N/A  . Number of children: N/A  . Years of education: N/A   Occupational History  . [employer] Part Time    Social History Main Topics  . Smoking status: Current Every Day Smoker    Packs/day: 1.00    Types: Cigarettes  . Smokeless tobacco: Not on file  . Alcohol use No  . Drug use: No  . Sexual activity: Not on file   Other Topics Concern  . Not on file   Social History Narrative  . No narrative on file      Review of Systems  All other systems reviewed and are negative.      Objective:   Physical Exam  Constitutional: He appears well-developed and well-nourished.  Neck: Neck supple. No JVD present.  Cardiovascular: Normal rate and regular rhythm.   No murmur heard. Pulmonary/Chest: Effort normal and breath sounds normal. No respiratory distress. He has no wheezes. He has no rales.  Abdominal: Soft. Bowel sounds are normal. He exhibits no distension. There is no tenderness. There is no rebound and no guarding.  Musculoskeletal: He exhibits edema.  Vitals reviewed.         Assessment & Plan:  Hyponatremia - Plan: BASIC METABOLIC PANEL WITH GFR  SIADH (syndrome of inappropriate ADH  production) ([facility]) - Plan: BASIC METABOLIC PANEL WITH GFR Decrease Lasix to 40 mg by mouth twice a day. Resume KDur at its previous dose.  Maintain at 1200 mL fluid restriction per day to avoid hyponatremia and pitting edema. Monitor his BMP including his renal function and his potassium because I am concerned that he may have been over diuresed. Given the fact he has not been taking his potassium also concerned about transient hypokalemia.

## 2016-10-02 ENCOUNTER — Encounter: Payer: Self-pay | Admitting: Family Medicine

## 2016-10-10 ENCOUNTER — Other Ambulatory Visit: Payer: Self-pay | Admitting: Family Medicine

## 2016-10-10 DIAGNOSIS — B009 Herpesviral infection, unspecified: Secondary | ICD-10-CM

## 2016-12-11 ENCOUNTER — Encounter: Payer: Self-pay | Admitting: Family Medicine

## 2016-12-14 ENCOUNTER — Other Ambulatory Visit: Payer: Self-pay | Admitting: Family Medicine

## 2016-12-15 NOTE — Telephone Encounter (Signed)
Medication refilled per protocol. 

## 2016-12-18 ENCOUNTER — Other Ambulatory Visit: Payer: Self-pay | Admitting: Family Medicine

## 2016-12-22 ENCOUNTER — Other Ambulatory Visit: Payer: Self-pay | Admitting: Family Medicine

## 2016-12-22 DIAGNOSIS — B009 Herpesviral infection, unspecified: Secondary | ICD-10-CM

## 2017-02-14 ENCOUNTER — Other Ambulatory Visit: Payer: Self-pay | Admitting: Family Medicine

## 2017-02-14 DIAGNOSIS — R000 Tachycardia, unspecified: Secondary | ICD-10-CM

## 2017-03-01 ENCOUNTER — Other Ambulatory Visit: Payer: Medicare Other

## 2017-03-01 ENCOUNTER — Telehealth: Payer: Self-pay | Admitting: Family Medicine

## 2017-03-01 ENCOUNTER — Other Ambulatory Visit: Payer: Self-pay | Admitting: Family Medicine

## 2017-03-01 DIAGNOSIS — E039 Hypothyroidism, unspecified: Secondary | ICD-10-CM

## 2017-03-01 DIAGNOSIS — D72829 Elevated white blood cell count, unspecified: Secondary | ICD-10-CM

## 2017-03-01 DIAGNOSIS — Z79899 Other long term (current) drug therapy: Secondary | ICD-10-CM

## 2017-03-01 DIAGNOSIS — I10 Essential (primary) hypertension: Secondary | ICD-10-CM

## 2017-03-01 DIAGNOSIS — F419 Anxiety disorder, unspecified: Secondary | ICD-10-CM

## 2017-03-01 DIAGNOSIS — E785 Hyperlipidemia, unspecified: Secondary | ICD-10-CM

## 2017-03-01 LAB — CBC WITH DIFFERENTIAL/PLATELET
BASOS ABS: 0 {cells}/uL (ref 0–200)
Basophils Relative: 0 %
Eosinophils Absolute: 150 cells/uL (ref 15–500)
Eosinophils Relative: 2 %
HCT: 36.7 % — ABNORMAL LOW (ref 38.5–50.0)
Hemoglobin: 12.6 g/dL — ABNORMAL LOW (ref 13.0–17.0)
LYMPHS PCT: 40 %
Lymphs Abs: 3000 cells/uL (ref 850–3900)
MCH: 30.2 pg (ref 27.0–33.0)
MCHC: 34.3 g/dL (ref 32.0–36.0)
MCV: 88 fL (ref 80.0–100.0)
MONO ABS: 975 {cells}/uL — AB (ref 200–950)
MPV: 9.2 fL (ref 7.5–12.5)
Monocytes Relative: 13 %
NEUTROS PCT: 45 %
Neutro Abs: 3375 cells/uL (ref 1500–7800)
Platelets: 173 10*3/uL (ref 140–400)
RBC: 4.17 MIL/uL — AB (ref 4.20–5.80)
RDW: 13.7 % (ref 11.0–15.0)
WBC: 7.5 10*3/uL (ref 3.8–10.8)

## 2017-03-01 LAB — COMPLETE METABOLIC PANEL WITH GFR
ALT: 5 U/L — ABNORMAL LOW (ref 9–46)
AST: 16 U/L (ref 10–40)
Albumin: 3.9 g/dL (ref 3.6–5.1)
Alkaline Phosphatase: 127 U/L — ABNORMAL HIGH (ref 40–115)
BUN: 11 mg/dL (ref 7–25)
CHLORIDE: 98 mmol/L (ref 98–110)
CO2: 35 mmol/L — ABNORMAL HIGH (ref 20–31)
Calcium: 9.2 mg/dL (ref 8.6–10.3)
Creat: 0.86 mg/dL (ref 0.60–1.35)
GFR, Est African American: >89 mL/min (ref >=60)
GLUCOSE: 101 mg/dL — AB (ref 70–99)
POTASSIUM: 4.7 mmol/L (ref 3.5–5.3)
SODIUM: 139 mmol/L (ref 135–146)
Total Bilirubin: 0.5 mg/dL (ref 0.2–1.2)
Total Protein: 5.9 g/dL — ABNORMAL LOW (ref 6.1–8.1)

## 2017-03-01 LAB — LIPID PANEL
CHOL/HDL RATIO: 4.2 ratio (ref <5.0)
Cholesterol: 155 mg/dL (ref <200)
HDL: 37 mg/dL — AB (ref >40)
LDL CALC: 84 mg/dL (ref <100)
TRIGLYCERIDES: 170 mg/dL — AB (ref <150)
VLDL: 34 mg/dL — ABNORMAL HIGH (ref <30)

## 2017-03-01 LAB — TSH: TSH: 2.1 mIU/L (ref 0.40–4.50)

## 2017-03-02 ENCOUNTER — Encounter: Payer: Self-pay | Admitting: Family Medicine

## 2017-03-12 ENCOUNTER — Other Ambulatory Visit: Payer: Self-pay | Admitting: Family Medicine

## 2017-03-12 DIAGNOSIS — B009 Herpesviral infection, unspecified: Secondary | ICD-10-CM

## 2017-03-20 ENCOUNTER — Other Ambulatory Visit: Payer: Self-pay | Admitting: Family Medicine

## 2017-03-20 DIAGNOSIS — R000 Tachycardia, unspecified: Secondary | ICD-10-CM

## 2017-03-30 ENCOUNTER — Other Ambulatory Visit: Payer: Self-pay | Admitting: Family Medicine

## 2017-03-30 DIAGNOSIS — R000 Tachycardia, unspecified: Secondary | ICD-10-CM

## 2017-04-16 ENCOUNTER — Other Ambulatory Visit: Payer: Self-pay | Admitting: Family Medicine

## 2017-04-16 DIAGNOSIS — B009 Herpesviral infection, unspecified: Secondary | ICD-10-CM

## 2017-04-16 MED ORDER — VALACYCLOVIR HCL 500 MG PO TABS: 500.0000 mg | ORAL_TABLET | Freq: Every day | ORAL | 3 refills | Status: DC

## 2017-04-24 ENCOUNTER — Other Ambulatory Visit: Payer: Self-pay | Admitting: Family Medicine

## 2017-04-24 DIAGNOSIS — B009 Herpesviral infection, unspecified: Secondary | ICD-10-CM

## 2017-05-17 ENCOUNTER — Other Ambulatory Visit: Payer: Self-pay | Admitting: Family Medicine

## 2017-06-18 ENCOUNTER — Ambulatory Visit (INDEPENDENT_AMBULATORY_CARE_PROVIDER_SITE_OTHER): Payer: Medicare Other | Admitting: Adult Health

## 2017-06-18 ENCOUNTER — Encounter: Payer: Self-pay | Admitting: Adult Health

## 2017-06-18 VITALS — BP 133/85 | HR 75 | Wt 225.0 lb

## 2017-06-18 DIAGNOSIS — G479 Sleep disorder, unspecified: Secondary | ICD-10-CM

## 2017-06-18 DIAGNOSIS — F3181 Bipolar II disorder: Secondary | ICD-10-CM

## 2017-06-18 DIAGNOSIS — G4733 Obstructive sleep apnea (adult) (pediatric): Secondary | ICD-10-CM | POA: Diagnosis not present

## 2017-06-18 NOTE — Progress Notes (Signed)
I agree with the assessment and plan as directed by NP .The patient is known to me .   Glynna Failla, MD  

## 2017-06-18 NOTE — Patient Instructions (Signed)
Your Plan:  Try to establish a regular sleep and wake cycle No caffeine 6 hours before bedtime Try to use CPAP nightly >4 hours each night If your symptoms worsen or you develop new symptoms please let us know.    Thank you for coming to see [place] at [facility]. I hope we have been able to provide you high quality care today.  You may receive a patient satisfaction survey over the next few weeks. We would appreciate your feedback and comments so that we may continue to improve ourselves and the health of our patients.

## 2017-06-18 NOTE — Progress Notes (Signed)
PATIENT: Tony Campos DOB: Nov 03, 1966  REASON FOR VISIT: follow up- OSA on CPAP  HISTORY FROM: patient  HISTORY OF PRESENT ILLNESS: Tony Campos is a 51 year old male with a history of obstructive sleep apnea on CPAP. He returns today for follow-up. His download indicates that he uses his machine 20 out of 30 days for compliance of 67%. He uses machine greater than 4 hours 12 out of 30 days for compliance of 40%. On average he uses his machine 6 hours and 11 minutes. His residual AHI is 1.6 on a minimum pressure of 4 cm water and maximum pressure of 15 cm H20 with EPAP 5 cm H20. The patient's residual AHI is 1.6. He does not have a significant leak. The patient states that he does not endorse a regular sleep routine. He states that he goes to bed at various times and wakes at various times. He states that he "tends to binge watch TV since he is on disability and does not have to work." The patient states that he has followed with psychiatry now for bipolar type II. He also reports that he carries a diagnosis of narcolepsy which his psychiatrist treats with armodafinil and Vyvanse. The patient also has several other complaints today listed in the review of system.   HISTORY from 06/15/2016, Tony Campos has continued to use his CPAP was 90% compliance. On the same note he still very excessively daytime sleepy with 16 points on his Epworth score and 48 points on his fatigue severity score. [facility] [employer] is his durable medical equipment company he is using an air sense or air curve machine ASV. The download today showed excellent resolution of apnea his residual AHI is 1.6 his inspiratory pressure relief is 5 cm water his minimum pressure support for maximum pressure support 15 cm water he has minimal air leaks and he uses the machine on average 5 hours and 2 minutes a day. He feels comfortable with using the CPAP  And has no issues with the interface   history from 04/18/2016: Tony Campos is  here today for 2 reasons one is to follow-up on his recent sleep studies the other to undergo memory testing.  The patient underwent a split night polysomnography on 01/10/2016 the patient was diagnosed with a severe AHI of 55.0 during REM sleep interestingly only 40.0 but in supine accentuated to 73.0. He has chronic back pain and therefore tries to avoid sleeping in certain positions. He cannot tolerate prone position, for example. CPAP was initiated at 5 cm water pressure and explored up to 12 cm water pressure. It was evident that he tolerated a pressure of 7 cm water very well and at higher pressures seemed to trigger central apneas. During his Pap titration there was no satisfying result noted and he had to return for a full night BiPAP titration on March 8. This time he was started on adapt SV 13 cm maximum, 5 cm minimum pressure support fullface mask. The AHI was still not improved until he reached the highest setting at about 3:30 AM. I am very pleased to see his download and compliance report today he had the machine available for 15 days and has used it for 93 percent of the time. Average user time is 5 hours and 40 minutes at night, the ASV is set at an expiratory pressure of 5 minimum pressure support of 4 maximum pressure support of 15 and his residual AHI is 1.6. This is an excellent alleviation of  his previously severe sleep apnea. There is also no major air leak noted ,the mask seems to fit well. He has no problems sleeping with this mask and at the current pressures. I would like to react to read that the patient has full facial hair yet seems to achieve a good air seal. Past medical history is positive for hypothyroidism, high blood pressure, depression anxiety. At one time he was labeled as a paranoid schizophrenic. He has undergone right knee surgery in 1992 couple tunnel surgeries 1998 sinus surgery 2005, cholecystectomy 2007, right knee surgery 2013 left knee surgery 2014. Both were  arthroscopic knee surgeries. One of the patient's concerns is that he may expect a knee replacement surgery in the near future and that should he have apnea he will need special dispensation for his anesthesiologist.   We also tested his memory and cognition today Tony Campos underwent a [place] cognitive assessment was 26 out of 30 points. His main difficulties with the recall of words after about 5 minutes. His short term memory seems to be impaired. He did not show signs of difficulties with spelling, subtraction, naming to visual spatial orientation. He was fully oriented to date and place. 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   REVIEW OF SYSTEMS: Out of a complete 14 system review of symptoms, the patient complains only of the following symptoms, and all other reviewed systems are negative.  Ear discharge, ringing in ears, runny nose, eye discharge, eye itching, light sensitivity, blurred vision, leg swelling, constipation, restless leg, daytime sleepiness, cold intolerance  ALLERGIES: Allergies  Allergen Reactions  . Testosterone     Edema from topical applications    HOME MEDICATIONS: Outpatient Medications Prior to Visit  Medication Sig Dispense Refill  . aspirin 81 MG tablet Take 81 mg by mouth daily.    Marland Kitchen buPROPion (WELLBUTRIN XL) 150 MG 24 hr tablet Take 450 mg by mouth daily.    . cilostazol (PLETAL) 100 MG tablet Take 100 mg by mouth 2 (two) times daily.    . clonazePAM (KLONOPIN) 1 MG tablet Take 1 mg by mouth 2 (two) times daily as needed for anxiety.    . clopidogrel (PLAVIX) 75 MG tablet Take 75 mg by mouth.    . fentaNYL (DURAGESIC - DOSED MCG/HR) 25 MCG/HR patch 50 mcg.   0  . furosemide (LASIX) 40 MG tablet Take 40mg  BID prn (Patient taking differently: 80 mg. Take 40mg  BID prn) 60 tablet 6  . lamoTRIgine (LAMICTAL) 200 MG tablet Take 200  mg by mouth daily.    Marland Kitchen levothyroxine (SYNTHROID, LEVOTHROID) 125 MCG tablet TAKE 1 TABLET BY MOUTH EVERY DAY BEFORE BREAKFAST. 30 tablet 0  . losartan (COZAAR) 50 MG tablet Take 1 tablet (50 mg total) by mouth daily. 90 tablet 3  . metoprolol tartrate (LOPRESSOR) 25 MG tablet TAKE (1) TABLET TWICE DAILY. 60 tablet 5  . morphine (MSIR) 15 MG tablet Take 15 mg by mouth every 4 (four) hours as needed for severe pain.    . niacin (NIASPAN) 500 MG CR tablet   2  . pantoprazole (PROTONIX) 40 MG tablet TAKE (1) TABLET TWICE DAILY. 60 tablet 11  . potassium chloride (K-DUR) 10 MEQ tablet TAKE 1 TABLET BY MOUTH TWICE DAILY AS NEEDED WITH LASIX. 60 tablet 2  . simvastatin (ZOCOR) 40 MG tablet Take 40 mg by mouth daily.    . valACYclovir (  VALTREX) 500 MG tablet TAKE 1 TABLET ONCE DAILY. 30 tablet 5  . baclofen (LIORESAL) 20 MG tablet Take 20 mg by mouth 3 (three) times daily.    Marland Kitchen levothyroxine (SYNTHROID, LEVOTHROID) 125 MCG tablet TAKE 1 TABLET BY MOUTH EVERY MORNING BEFORE BREAKFAST. 30 tablet 0  . losartan-hydrochlorothiazide (HYZAAR) 50-12.5 MG tablet TAKE (1) TABLET BY MOUTH ONCE DAILY. (Patient not taking: Reported on 09/29/2016) 30 tablet 0   No facility-administered medications prior to visit.     PAST MEDICAL HISTORY: Past Medical History:  Diagnosis Date  . Acid reflux   . Anxiety   . Depression   . Genital warts   . Hyperlipidemia   . Hypertension   . Hypothyroidism   . Meningitis   . PAD (peripheral artery disease) ([place])   . SIADH (syndrome of inappropriate ADH production) ([place])   . Sleep apnea 12/2015   needs BIPAP, AHI 55/hr  . Smoker   . Smoker   . Spondylosis   . Testosterone deficiency     PAST SURGICAL HISTORY: Past Surgical History:  Procedure Laterality Date  . CHOLECYSTECTOMY  2007  . KNEE SURGERY     right  . NASAL SEPTUM SURGERY    . NASAL SINUS SURGERY    . right sfa recanalization and stenting      [facility] 12/2015  . WRIST SURGERY     right and left     FAMILY HISTORY: Family History  Problem Relation Age of Onset  . Cancer Maternal Grandfather        stomach    SOCIAL HISTORY: Social History   Social History  . Marital status: Married    Spouse name: N/A  . Number of children: N/A  . Years of education: N/A   Occupational History  . [employer] at [employer] Part Time    Social History Main Topics  . Smoking status: Current Every Day Smoker    Packs/day: 1.00    Types: Cigarettes  . Smokeless tobacco: Never Used  . Alcohol use No  . Drug use: No  . Sexual activity: Not on file   Other Topics Concern  . Not on file   Social History Narrative  . No narrative on file      PHYSICAL EXAM  Vitals:   06/18/17 1100  BP: 133/85  Pulse: 75  Weight: 225 lb (102.1 kg)   Body mass index is 30.52 kg/m.  Generalized: Well developed, in no acute distress   Neurological examination  Mentation: Alert oriented to time, place, history taking. Follows all commands speech and language fluent Cranial nerve II-XII: Pupils were equal round reactive to light. Extraocular movements were full, visual field were full on confrontational test. Facial sensation and strength were normal. Uvula tongue midline. Head turning and shoulder shrug  were normal and symmetric. Motor: The motor testing reveals 5 over 5 strength of all 4 extremities except slightly weaker in the right lower she really. Sensory: Sensory testing is intact to soft touch on all 4 extremities. No evidence of extinction is noted.  Coordination: Cerebellar testing reveals good finger-nose-finger and heel-to-shin bilaterally.  Gait and station: Gait is normal. Tandem gait is normal. Romberg is negative. No drift is seen.  Reflexes: Deep tendon reflexes are symmetric and normal bilaterally.   DIAGNOSTIC DATA (LABS, IMAGING, TESTING) - I reviewed patient records, labs, notes, testing and imaging myself where available.  Lab Results  Component Value Date   WBC  7.5 03/01/2017   HGB 12.6 (L)  03/01/2017   HCT 36.7 (L) 03/01/2017   MCV 88.0 03/01/2017   PLT 173 03/01/2017      Component Value Date/Time   NA 139 03/01/2017 1158   K 4.7 03/01/2017 1158   CL 98 03/01/2017 1158   CO2 35 (H) 03/01/2017 1158   GLUCOSE 101 (H) 03/01/2017 1158   BUN 11 03/01/2017 1158   CREATININE 0.86 03/01/2017 1158   CALCIUM 9.2 03/01/2017 1158   PROT 5.9 (L) 03/01/2017 1158   ALBUMIN 3.9 03/01/2017 1158   AST 16 03/01/2017 1158   ALT 5 (L) 03/01/2017 1158   ALKPHOS 127 (H) 03/01/2017 1158   BILITOT 0.5 03/01/2017 1158   GFRNONAA >89 03/01/2017 1158   GFRAA >89 03/01/2017 1158   Lab Results  Component Value Date   CHOL 155 03/01/2017   HDL 37 (L) 03/01/2017   LDLCALC 84 03/01/2017   TRIG 170 (H) 03/01/2017   CHOLHDL 4.2 03/01/2017   Lab Results  Component Value Date   HGBA1C 5.4 10/18/2015   Lab Results  Component Value Date   VITAMINB12 1,550 (H) 11/25/2015   Lab Results  Component Value Date   TSH 2.10 03/01/2017      ASSESSMENT AND PLAN 51 y.o. year old male  has a past medical history of Acid reflux; Anxiety; Depression; Genital warts; Hyperlipidemia; Hypertension; Hypothyroidism; Meningitis; PAD (peripheral artery disease) ([place]); SIADH (syndrome of inappropriate ADH production) ([place]); Sleep apnea (12/2015); Smoker; Smoker; Spondylosis; and Testosterone deficiency. here with:  1. OSA on CPAP 2. Sleep disturbance  3. Bipolar type 2  The patient's compliance download shows suboptimal compliance. This is mostly due to the fact that his sleep schedule is altered. I advised that is important for him to establish a regular sleep/wake cycle. He should also avoid caffeine 6 hours before bedtime. The patient should try to use the CPAP nightly and greater than 4 hours each night. He is advised that if his symptoms worsen or he develops new symptoms he should let us know. He will follow-up in 6 months with Dr. Brett Fairy.     Ward Givens, MSN,  NP-C 06/18/2017, 11:11 AM [facility] [address], [place],  [address] [phone]

## 2017-06-29 ENCOUNTER — Other Ambulatory Visit: Payer: Self-pay | Admitting: Family Medicine

## 2017-08-03 ENCOUNTER — Other Ambulatory Visit: Payer: Self-pay | Admitting: Family Medicine

## 2017-09-27 ENCOUNTER — Encounter: Payer: Self-pay | Admitting: Family Medicine

## 2017-10-11 ENCOUNTER — Other Ambulatory Visit: Payer: Self-pay | Admitting: Family Medicine

## 2017-11-01 ENCOUNTER — Encounter: Payer: Self-pay | Admitting: Family Medicine

## 2017-11-01 ENCOUNTER — Ambulatory Visit (INDEPENDENT_AMBULATORY_CARE_PROVIDER_SITE_OTHER): Payer: Medicare Other | Admitting: Family Medicine

## 2017-11-01 VITALS — BP 160/90 | HR 72 | Temp 98.7°F | Resp 16 | Ht 72.0 in | Wt 211.0 lb

## 2017-11-01 DIAGNOSIS — J40 Bronchitis, not specified as acute or chronic: Secondary | ICD-10-CM | POA: Diagnosis not present

## 2017-11-01 DIAGNOSIS — E039 Hypothyroidism, unspecified: Secondary | ICD-10-CM

## 2017-11-01 DIAGNOSIS — E7800 Pure hypercholesterolemia, unspecified: Secondary | ICD-10-CM | POA: Diagnosis not present

## 2017-11-01 MED ORDER — DOXYCYCLINE HYCLATE 100 MG PO TABS: 100.0000 mg | ORAL_TABLET | Freq: Two times a day (BID) | ORAL | 0 refills | Status: DC

## 2017-11-01 MED ORDER — FLUTICASONE PROPIONATE 50 MCG/ACT NA SUSP: 2.0000 | Freq: Every day | NASAL | 6 refills | Status: DC

## 2017-11-01 NOTE — Progress Notes (Signed)
Subjective:    Patient ID: Tony Campos, male    DOB: June 28, 1966, 51 y.o.   MRN: [id number]  HPI  Patient states he has been sick for over a month.  Symptoms include a cough productive of yellow sputum, wheezing, postnasal drip, rhinorrhea, generalized malaise and fatigue.  Cough is gradually getting worse.  He also reports left upper quadrant abdominal pain.  Pain is worse with twisting motions or bending over.  It sounds muscular in nature.  On exam today, his abdomen is soft, nondistended, nontender and I am unable to reproduce the pain with palpation.  However when he twists, the pain is located in his left side in the axillary line and seems to be chest/abdominal wall muscular in origin.  He also request that I draw his lab work while he is here.  He is overdue for a TSH for his hypothyroidism as well as a fasting lipid panel for his cholesterol.  Past Medical History:  Diagnosis Date  . Acid reflux   . Anxiety   . Depression   . Genital warts   . Hyperlipidemia   . Hypertension   . Hypothyroidism   . Meningitis   . PAD (peripheral artery disease) ([place])   . SIADH (syndrome of inappropriate ADH production) ([place])   . Sleep apnea 12/2015   needs BIPAP, AHI 55/hr  . Smoker   . Smoker   . Spondylosis   . Testosterone deficiency    Past Surgical History:  Procedure Laterality Date  . CHOLECYSTECTOMY  2007  . KNEE SURGERY     right  . NASAL SEPTUM SURGERY    . NASAL SINUS SURGERY    . right sfa recanalization and stenting      [facility] 12/2015  . WRIST SURGERY     right and left   Current Outpatient Medications on File Prior to Visit  Medication Sig Dispense Refill  . Armodafinil 250 MG tablet 250 mg daily.  1  . aspirin 81 MG tablet Take 81 mg by mouth daily.    Marland Kitchen buPROPion (WELLBUTRIN XL) 150 MG 24 hr tablet Take 450 mg by mouth daily.    . clonazePAM (KLONOPIN) 1 MG tablet Take 1 mg by mouth 2 (two) times daily as needed for anxiety.    . fentaNYL (DURAGESIC - DOSED  MCG/HR) 25 MCG/HR patch 50 mcg.   0  . furosemide (LASIX) 40 MG tablet Take 40mg  BID prn (Patient taking differently: 80 mg. Take 40mg  BID prn) 60 tablet 6  . lamoTRIgine (LAMICTAL) 200 MG tablet Take 200 mg by mouth daily.    Marland Kitchen levothyroxine (SYNTHROID, LEVOTHROID) 125 MCG tablet TAKE 1 TABLET BY MOUTH EVERY MORNING BEFORE BREAKFAST. 30 tablet 0  . losartan (COZAAR) 50 MG tablet Take 1 tablet (50 mg total) by mouth daily. 90 tablet 3  . METHOCARBAMOL PO Take by mouth. 06/18/17 unsure of dose    . metoprolol tartrate (LOPRESSOR) 25 MG tablet TAKE (1) TABLET TWICE DAILY. 60 tablet 5  . morphine (MSIR) 15 MG tablet Take 15 mg by mouth every 4 (four) hours as needed for severe pain.    . pantoprazole (PROTONIX) 40 MG tablet TAKE (1) TABLET TWICE DAILY. 60 tablet 0  . potassium chloride (K-DUR) 10 MEQ tablet TAKE 1 TABLET TWICE A DAY AS NEEDED WITH LASIX. 60 tablet 2  . simvastatin (ZOCOR) 40 MG tablet Take 40 mg by mouth daily.    . valACYclovir (VALTREX) 500 MG tablet TAKE 1 TABLET ONCE DAILY.  30 tablet 5  . VYVANSE 70 MG capsule 70 mg daily.  0  . niacin (NIASPAN) 500 MG CR tablet   2  . [DISCONTINUED] esomeprazole (NEXIUM) 40 MG capsule Take 40 mg by mouth daily before breakfast.       No current facility-administered medications on file prior to visit.    Allergies  Allergen Reactions  . Testosterone     Edema from topical applications   Social History   Socioeconomic History  . Marital status: Married    Spouse name: Not on file  . Number of children: Not on file  . Years of education: Not on file  . Highest education level: Not on file  Social Needs  . Financial resource strain: Not on file  . Food insecurity - worry: Not on file  . Food insecurity - inability: Not on file  . Transportation needs - medical: Not on file  . Transportation needs - non-medical: Not on file  Occupational History  . 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 Smoking  status: Current Every Day Smoker    Packs/day: 1.00    Types: Cigarettes  . Smokeless tobacco: Never Used  Substance and Sexual Activity  . Alcohol use: No  . Drug use: No  . Sexual activity: Not on file  Other Topics Concern  . Not on file  Social History Narrative  . Not on file     Review of Systems  All other systems reviewed and are negative.      Objective:   Physical Exam  Constitutional: He appears well-developed and well-nourished. No distress.  HENT:  Right Ear: External ear normal.  Left Ear: External ear normal.  Nose: Nose normal.  Mouth/Throat: Oropharynx is clear and moist. No oropharyngeal exudate.  Eyes: Conjunctivae are normal.  Neck: Neck supple. No thyromegaly present.  Cardiovascular: Normal rate, regular rhythm and normal heart sounds.  Pulmonary/Chest: [place] normal. He has wheezes. He has rales. He exhibits no tenderness.  Abdominal: Soft. Bowel sounds are normal. He exhibits no distension and no mass. There is no tenderness. There is no rebound and no guarding.  Musculoskeletal: He exhibits no edema.  Lymphadenopathy:    He has no cervical adenopathy.  Skin: No rash noted. He is not diaphoretic. No erythema.  Vitals reviewed.         Assessment & Plan:  Bronchitis - Plan: fluticasone (FLONASE) 50 MCG/ACT nasal spray, doxycycline (VIBRA-TABS) 100 MG tablet  Hypothyroidism, unspecified type - Plan: TSH  Pure hypercholesterolemia - Plan: CBC with Differential/Platelet, COMPLETE METABOLIC PANEL WITH GFR, Lipid panel  I believe the patient has bronchitis.  I will start the patient on doxycycline 100 mg p.o. twice daily for 10 days to cover for bronchitis.  I will also start the patient on Flonase 2 sprays each nostril daily for postnasal drip and possible allergic component to the chronic cough.  While obtaining fasting lab work, I will check a CBC, CMP, and fasting lipid panel.  His goal LDL cholesterol is less than 70 given his history of  ASCVD.  I will also check a TSH to monitor the management of his hypothyroidism.  Continue to encourage smoking cessation.  If cough is not improving, proceed with a chest x-ray.  Given the left upper quadrant pain and the fatigue, I did examine the patient thoroughly and I appreciate no splenomegaly.  I will check a CBC to evaluate for leukocytosis.  Blood pressure is very high but  the patient admits he has not taken his blood pressure medication in 2 days.  Encouraged him to take his blood pressure medication

## 2017-11-02 LAB — CBC WITH DIFFERENTIAL/PLATELET
BASOS ABS: 71 {cells}/uL (ref 0–200)
Basophils Relative: 0.7 %
EOS PCT: 1.1 %
Eosinophils Absolute: 112 cells/uL (ref 15–500)
HEMATOCRIT: 39.6 % (ref 38.5–50.0)
HEMOGLOBIN: 13.9 g/dL (ref 13.2–17.1)
LYMPHS ABS: 4304 {cells}/uL — AB (ref 850–3900)
MCH: 30.1 pg (ref 27.0–33.0)
MCHC: 35.1 g/dL (ref 32.0–36.0)
MCV: 85.7 fL (ref 80.0–100.0)
MPV: 10.5 fL (ref 7.5–12.5)
Monocytes Relative: 7.4 %
NEUTROS ABS: 4957 {cells}/uL (ref 1500–7800)
Neutrophils Relative %: 48.6 %
Platelets: 215 10*3/uL (ref 140–400)
RBC: 4.62 10*6/uL (ref 4.20–5.80)
RDW: 12.9 % (ref 11.0–15.0)
Total Lymphocyte: 42.2 %
WBC: 10.2 10*3/uL (ref 3.8–10.8)
WBCMIX: 755 {cells}/uL (ref 200–950)

## 2017-11-02 LAB — COMPLETE METABOLIC PANEL WITH GFR
AG Ratio: 2.2 (calc) (ref 1.0–2.5)
ALBUMIN MSPROF: 4.2 g/dL (ref 3.6–5.1)
ALT: 5 U/L — ABNORMAL LOW (ref 9–46)
AST: 12 U/L (ref 10–40)
Alkaline phosphatase (APISO): 100 U/L (ref 40–115)
BUN: 7 mg/dL (ref 7–25)
CO2: 29 mmol/L (ref 20–32)
CREATININE: 0.91 mg/dL (ref 0.60–1.35)
Calcium: 9.6 mg/dL (ref 8.6–10.3)
Chloride: 103 mmol/L (ref 98–110)
GFR, EST AFRICAN AMERICAN: 114 mL/min/{1.73_m2} (ref >=60)
GFR, EST NON AFRICAN AMERICAN: 99 mL/min/{1.73_m2} (ref >=60)
GLUCOSE: 100 mg/dL — AB (ref 65–99)
Globulin: 1.9 g/dL (calc) (ref 1.9–3.7)
Potassium: 4.6 mmol/L (ref 3.5–5.3)
Sodium: 140 mmol/L (ref 135–146)
TOTAL PROTEIN: 6.1 g/dL (ref 6.1–8.1)
Total Bilirubin: 0.3 mg/dL (ref 0.2–1.2)

## 2017-11-02 LAB — TSH: TSH: 1.47 m[IU]/L (ref 0.40–4.50)

## 2017-11-02 LAB — LIPID PANEL
CHOL/HDL RATIO: 5.8 (calc) — AB (ref <5.0)
Cholesterol: 221 mg/dL — ABNORMAL HIGH (ref <200)
HDL: 38 mg/dL — ABNORMAL LOW (ref >40)
LDL CHOLESTEROL (CALC): 144 mg/dL — AB
Non-HDL Cholesterol (Calc): 183 mg/dL (calc) — ABNORMAL HIGH (ref <130)
Triglycerides: 242 mg/dL — ABNORMAL HIGH (ref <150)

## 2017-11-08 ENCOUNTER — Telehealth: Payer: Self-pay | Admitting: Family Medicine

## 2017-11-08 ENCOUNTER — Other Ambulatory Visit: Payer: Self-pay | Admitting: Family Medicine

## 2017-11-08 DIAGNOSIS — R053 Chronic cough: Secondary | ICD-10-CM

## 2017-11-08 DIAGNOSIS — R05 Cough: Secondary | ICD-10-CM

## 2017-11-08 MED ORDER — ROSUVASTATIN CALCIUM 40 MG PO TABS: 40.0000 mg | ORAL_TABLET | Freq: Every day | ORAL | 3 refills | Status: DC

## 2017-11-08 NOTE — Telephone Encounter (Signed)
Pt called and states that he is no better from previous ov - still coughing a lot. Per LOV ok to send for CXR. Ordered entered for CXR and pt will go to [facility] to have this done.

## 2017-11-09 ENCOUNTER — Ambulatory Visit (HOSPITAL_COMMUNITY)
Admission: RE | Admit: 2017-11-09 | Discharge: 2017-11-09 | Disposition: A | Discharge status: Home / Self Care | Payer: Medicare Other | Source: Ambulatory Visit | Attending: Family Medicine | Admitting: Family Medicine

## 2017-11-09 DIAGNOSIS — R05 Cough: Secondary | ICD-10-CM | POA: Diagnosis present

## 2017-11-09 DIAGNOSIS — R053 Chronic cough: Secondary | ICD-10-CM

## 2017-11-26 ENCOUNTER — Other Ambulatory Visit: Payer: Self-pay | Admitting: *Deleted

## 2017-11-26 MED ORDER — LEVOTHYROXINE SODIUM 125 MCG PO TABS: ORAL_TABLET | ORAL | 3 refills | Status: DC

## 2017-12-05 DIAGNOSIS — I739 Peripheral vascular disease, unspecified: Secondary | ICD-10-CM | POA: Diagnosis not present

## 2017-12-05 DIAGNOSIS — Z95820 Peripheral vascular angioplasty status with implants and grafts: Secondary | ICD-10-CM | POA: Diagnosis not present

## 2017-12-06 DIAGNOSIS — G47419 Narcolepsy without cataplexy: Secondary | ICD-10-CM | POA: Diagnosis not present

## 2017-12-06 DIAGNOSIS — R69 Illness, unspecified: Secondary | ICD-10-CM | POA: Diagnosis not present

## 2017-12-06 DIAGNOSIS — M47816 Spondylosis without myelopathy or radiculopathy, lumbar region: Secondary | ICD-10-CM | POA: Diagnosis not present

## 2017-12-06 DIAGNOSIS — G894 Chronic pain syndrome: Secondary | ICD-10-CM | POA: Diagnosis not present

## 2017-12-06 DIAGNOSIS — F4312 Post-traumatic stress disorder, chronic: Secondary | ICD-10-CM | POA: Diagnosis not present

## 2017-12-06 DIAGNOSIS — G4730 Sleep apnea, unspecified: Secondary | ICD-10-CM | POA: Diagnosis not present

## 2017-12-06 DIAGNOSIS — M62830 Muscle spasm of back: Secondary | ICD-10-CM | POA: Diagnosis not present

## 2017-12-06 DIAGNOSIS — M47812 Spondylosis without myelopathy or radiculopathy, cervical region: Secondary | ICD-10-CM | POA: Diagnosis not present

## 2017-12-19 ENCOUNTER — Ambulatory Visit: Payer: Medicare Other | Admitting: Neurology

## 2017-12-21 ENCOUNTER — Encounter: Payer: Self-pay | Admitting: Neurology

## 2017-12-26 ENCOUNTER — Other Ambulatory Visit: Payer: Self-pay | Admitting: Family Medicine

## 2017-12-26 DIAGNOSIS — R000 Tachycardia, unspecified: Secondary | ICD-10-CM

## 2017-12-26 MED ORDER — METOPROLOL TARTRATE 25 MG PO TABS: ORAL_TABLET | ORAL | 2 refills | Status: DC

## 2018-01-03 DIAGNOSIS — G894 Chronic pain syndrome: Secondary | ICD-10-CM | POA: Diagnosis not present

## 2018-01-03 DIAGNOSIS — M47812 Spondylosis without myelopathy or radiculopathy, cervical region: Secondary | ICD-10-CM | POA: Diagnosis not present

## 2018-01-03 DIAGNOSIS — M47816 Spondylosis without myelopathy or radiculopathy, lumbar region: Secondary | ICD-10-CM | POA: Diagnosis not present

## 2018-01-03 DIAGNOSIS — M62830 Muscle spasm of back: Secondary | ICD-10-CM | POA: Diagnosis not present

## 2018-01-12 ENCOUNTER — Other Ambulatory Visit: Payer: Self-pay | Admitting: Family Medicine

## 2018-01-12 DIAGNOSIS — I10 Essential (primary) hypertension: Secondary | ICD-10-CM

## 2018-01-24 DIAGNOSIS — G47419 Narcolepsy without cataplexy: Secondary | ICD-10-CM | POA: Diagnosis not present

## 2018-01-24 DIAGNOSIS — G4730 Sleep apnea, unspecified: Secondary | ICD-10-CM | POA: Diagnosis not present

## 2018-01-24 DIAGNOSIS — F4312 Post-traumatic stress disorder, chronic: Secondary | ICD-10-CM | POA: Diagnosis not present

## 2018-01-24 DIAGNOSIS — R69 Illness, unspecified: Secondary | ICD-10-CM | POA: Diagnosis not present

## 2018-02-11 DIAGNOSIS — M47816 Spondylosis without myelopathy or radiculopathy, lumbar region: Secondary | ICD-10-CM | POA: Diagnosis not present

## 2018-02-11 DIAGNOSIS — M62830 Muscle spasm of back: Secondary | ICD-10-CM | POA: Diagnosis not present

## 2018-02-11 DIAGNOSIS — G894 Chronic pain syndrome: Secondary | ICD-10-CM | POA: Diagnosis not present

## 2018-02-11 DIAGNOSIS — M47812 Spondylosis without myelopathy or radiculopathy, cervical region: Secondary | ICD-10-CM | POA: Diagnosis not present

## 2018-02-11 DIAGNOSIS — Z79891 Long term (current) use of opiate analgesic: Secondary | ICD-10-CM | POA: Diagnosis not present

## 2018-02-18 ENCOUNTER — Encounter: Payer: Self-pay | Admitting: Family Medicine

## 2018-03-07 ENCOUNTER — Other Ambulatory Visit: Payer: Self-pay

## 2018-03-07 ENCOUNTER — Emergency Department (HOSPITAL_COMMUNITY): Payer: Medicare HMO

## 2018-03-07 ENCOUNTER — Encounter (HOSPITAL_COMMUNITY): Payer: Self-pay | Admitting: Emergency Medicine

## 2018-03-07 ENCOUNTER — Emergency Department (HOSPITAL_COMMUNITY)
Admission: EM | Admit: 2018-03-07 | Discharge: 2018-03-07 | Disposition: A | Discharge status: Home / Self Care | Payer: Medicare HMO | Attending: Emergency Medicine | Admitting: Emergency Medicine

## 2018-03-07 DIAGNOSIS — I10 Essential (primary) hypertension: Secondary | ICD-10-CM | POA: Diagnosis not present

## 2018-03-07 DIAGNOSIS — M7989 Other specified soft tissue disorders: Secondary | ICD-10-CM | POA: Diagnosis not present

## 2018-03-07 DIAGNOSIS — Y929 Unspecified place or not applicable: Secondary | ICD-10-CM | POA: Diagnosis not present

## 2018-03-07 DIAGNOSIS — S065X9A Traumatic subdural hemorrhage with loss of consciousness of unspecified duration, initial encounter: Secondary | ICD-10-CM | POA: Diagnosis not present

## 2018-03-07 DIAGNOSIS — S02641A Fracture of ramus of right mandible, initial encounter for closed fracture: Secondary | ICD-10-CM

## 2018-03-07 DIAGNOSIS — Z7982 Long term (current) use of aspirin: Secondary | ICD-10-CM | POA: Insufficient documentation

## 2018-03-07 DIAGNOSIS — I6200 Nontraumatic subdural hemorrhage, unspecified: Secondary | ICD-10-CM | POA: Diagnosis not present

## 2018-03-07 DIAGNOSIS — S63635A Sprain of interphalangeal joint of left ring finger, initial encounter: Secondary | ICD-10-CM | POA: Insufficient documentation

## 2018-03-07 DIAGNOSIS — Z79899 Other long term (current) drug therapy: Secondary | ICD-10-CM | POA: Insufficient documentation

## 2018-03-07 DIAGNOSIS — Y939 Activity, unspecified: Secondary | ICD-10-CM | POA: Insufficient documentation

## 2018-03-07 DIAGNOSIS — S065XAA Traumatic subdural hemorrhage with loss of consciousness status unknown, initial encounter: Secondary | ICD-10-CM

## 2018-03-07 DIAGNOSIS — F17210 Nicotine dependence, cigarettes, uncomplicated: Secondary | ICD-10-CM | POA: Insufficient documentation

## 2018-03-07 DIAGNOSIS — E039 Hypothyroidism, unspecified: Secondary | ICD-10-CM | POA: Diagnosis not present

## 2018-03-07 DIAGNOSIS — S0990XA Unspecified injury of head, initial encounter: Secondary | ICD-10-CM | POA: Diagnosis present

## 2018-03-07 DIAGNOSIS — S6992XA Unspecified injury of left wrist, hand and finger(s), initial encounter: Secondary | ICD-10-CM | POA: Diagnosis not present

## 2018-03-07 DIAGNOSIS — E785 Hyperlipidemia, unspecified: Secondary | ICD-10-CM | POA: Diagnosis not present

## 2018-03-07 DIAGNOSIS — S0269XA Fracture of mandible of other specified site, initial encounter for closed fracture: Secondary | ICD-10-CM | POA: Diagnosis not present

## 2018-03-07 DIAGNOSIS — Y998 Other external cause status: Secondary | ICD-10-CM | POA: Diagnosis not present

## 2018-03-07 DIAGNOSIS — R69 Illness, unspecified: Secondary | ICD-10-CM | POA: Diagnosis not present

## 2018-03-07 DIAGNOSIS — R51 Headache: Secondary | ICD-10-CM | POA: Diagnosis not present

## 2018-03-07 MED ORDER — OXYCODONE-ACETAMINOPHEN 5-325 MG PO TABS
1.0000 | ORAL_TABLET | Freq: Once | ORAL | Status: AC
  Administered 2018-03-07: 1 via ORAL
  Filled 2018-03-07: qty 1
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
## 2018-03-07 NOTE — Discharge Instructions (Addendum)
Return to the ER for more confusion or vomiting.  Return for increasing headaches.  Liquid only diet.
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
## 2018-03-08 ENCOUNTER — Other Ambulatory Visit: Payer: Self-pay

## 2018-03-08 ENCOUNTER — Encounter (HOSPITAL_COMMUNITY): Payer: Self-pay | Admitting: *Deleted

## 2018-03-08 DIAGNOSIS — S02621A Fracture of subcondylar process of right mandible, initial encounter for closed fracture: Secondary | ICD-10-CM | POA: Insufficient documentation

## 2018-03-08 NOTE — Progress Notes (Signed)
Spoke with pt's wife, Larena Glassman for pre-op call. Pt unable to talk with broken jaw. She states pt does not have any cardiac history and states pt is not diabetic.

## 2018-03-09 ENCOUNTER — Ambulatory Visit (HOSPITAL_COMMUNITY): Payer: Medicare HMO | Admitting: Anesthesiology

## 2018-03-09 ENCOUNTER — Encounter (HOSPITAL_COMMUNITY)
Admission: RE | Disposition: A | Discharge status: Home / Self Care | Payer: Self-pay | Source: Ambulatory Visit | Attending: Otolaryngology

## 2018-03-09 ENCOUNTER — Ambulatory Visit (HOSPITAL_COMMUNITY)
Admission: RE | Admit: 2018-03-09 | Discharge: 2018-03-09 | Disposition: A | Discharge status: Home / Self Care | Payer: Medicare HMO | Source: Ambulatory Visit | Attending: Otolaryngology | Admitting: Otolaryngology

## 2018-03-09 ENCOUNTER — Encounter (HOSPITAL_COMMUNITY): Payer: Self-pay | Admitting: *Deleted

## 2018-03-09 DIAGNOSIS — F419 Anxiety disorder, unspecified: Secondary | ICD-10-CM | POA: Diagnosis not present

## 2018-03-09 DIAGNOSIS — S02609A Fracture of mandible, unspecified, initial encounter for closed fracture: Secondary | ICD-10-CM | POA: Diagnosis not present

## 2018-03-09 DIAGNOSIS — E222 Syndrome of inappropriate secretion of antidiuretic hormone: Secondary | ICD-10-CM | POA: Insufficient documentation

## 2018-03-09 DIAGNOSIS — G4730 Sleep apnea, unspecified: Secondary | ICD-10-CM | POA: Diagnosis not present

## 2018-03-09 DIAGNOSIS — S02641A Fracture of ramus of right mandible, initial encounter for closed fracture: Secondary | ICD-10-CM | POA: Diagnosis not present

## 2018-03-09 DIAGNOSIS — E291 Testicular hypofunction: Secondary | ICD-10-CM | POA: Diagnosis not present

## 2018-03-09 DIAGNOSIS — F329 Major depressive disorder, single episode, unspecified: Secondary | ICD-10-CM | POA: Insufficient documentation

## 2018-03-09 DIAGNOSIS — I739 Peripheral vascular disease, unspecified: Secondary | ICD-10-CM | POA: Diagnosis not present

## 2018-03-09 DIAGNOSIS — Y939 Activity, unspecified: Secondary | ICD-10-CM | POA: Insufficient documentation

## 2018-03-09 DIAGNOSIS — R51 Headache: Secondary | ICD-10-CM | POA: Diagnosis not present

## 2018-03-09 DIAGNOSIS — E039 Hypothyroidism, unspecified: Secondary | ICD-10-CM | POA: Insufficient documentation

## 2018-03-09 DIAGNOSIS — Z9049 Acquired absence of other specified parts of digestive tract: Secondary | ICD-10-CM | POA: Diagnosis not present

## 2018-03-09 DIAGNOSIS — K219 Gastro-esophageal reflux disease without esophagitis: Secondary | ICD-10-CM | POA: Insufficient documentation

## 2018-03-09 DIAGNOSIS — E785 Hyperlipidemia, unspecified: Secondary | ICD-10-CM | POA: Diagnosis not present

## 2018-03-09 DIAGNOSIS — Z800 Family history of malignant neoplasm of digestive organs: Secondary | ICD-10-CM | POA: Diagnosis not present

## 2018-03-09 DIAGNOSIS — S065X9A Traumatic subdural hemorrhage with loss of consciousness of unspecified duration, initial encounter: Secondary | ICD-10-CM | POA: Insufficient documentation

## 2018-03-09 DIAGNOSIS — M1990 Unspecified osteoarthritis, unspecified site: Secondary | ICD-10-CM | POA: Diagnosis not present

## 2018-03-09 DIAGNOSIS — Z79899 Other long term (current) drug therapy: Secondary | ICD-10-CM | POA: Diagnosis not present

## 2018-03-09 DIAGNOSIS — S02671A Fracture of alveolus of right mandible, initial encounter for closed fracture: Secondary | ICD-10-CM

## 2018-03-09 DIAGNOSIS — Z7982 Long term (current) use of aspirin: Secondary | ICD-10-CM | POA: Insufficient documentation

## 2018-03-09 DIAGNOSIS — I10 Essential (primary) hypertension: Secondary | ICD-10-CM | POA: Diagnosis not present

## 2018-03-09 DIAGNOSIS — M479 Spondylosis, unspecified: Secondary | ICD-10-CM | POA: Insufficient documentation

## 2018-03-09 DIAGNOSIS — R69 Illness, unspecified: Secondary | ICD-10-CM | POA: Diagnosis not present

## 2018-03-09 DIAGNOSIS — F17200 Nicotine dependence, unspecified, uncomplicated: Secondary | ICD-10-CM | POA: Diagnosis not present

## 2018-03-09 HISTORY — PX: CLOSED REDUCTION MANDIBLE: SHX5307

## 2018-03-09 LAB — POCT I-STAT, CHEM 8
BUN: 6 mg/dL (ref 6–20)
CALCIUM ION: 1.14 mmol/L — AB (ref 1.15–1.40)
Chloride: 102 mmol/L (ref 101–111)
Creatinine, Ser: 1 mg/dL (ref 0.61–1.24)
GLUCOSE: 86 mg/dL (ref 65–99)
HCT: 32 % — ABNORMAL LOW (ref 39.0–52.0)
HEMOGLOBIN: 10.9 g/dL — AB (ref 13.0–17.0)
POTASSIUM: 4.2 mmol/L (ref 3.5–5.1)
SODIUM: 139 mmol/L (ref 135–145)
TCO2: 30 mmol/L (ref 22–32)

## 2018-03-09 SURGERY — CLOSED REDUCTION, MANDIBLE
Anesthesia: General | Site: Mouth

## 2018-03-09 MED ORDER — CEFAZOLIN SODIUM-DEXTROSE 2-4 GM/100ML-% IV SOLN
2000.0000 mg | Freq: Once | INTRAVENOUS | Status: AC
  Administered 2018-03-09: 2 g via INTRAVENOUS
  Filled 2018-03-09: qty 100

## 2018-03-09 MED ORDER — FENTANYL CITRATE (PF) 100 MCG/2ML IJ SOLN
INTRAMUSCULAR | Status: DC | PRN
  Administered 2018-03-09: 100 ug via INTRAVENOUS
  Administered 2018-03-09: 50 ug via INTRAVENOUS

## 2018-03-09 MED ORDER — MIDAZOLAM HCL 2 MG/2ML IJ SOLN
INTRAMUSCULAR | Status: AC
  Filled 2018-03-09: qty 2

## 2018-03-09 MED ORDER — FENTANYL CITRATE (PF) 250 MCG/5ML IJ SOLN
INTRAMUSCULAR | Status: AC
  Filled 2018-03-09: qty 5

## 2018-03-09 MED ORDER — ONDANSETRON HCL 4 MG/2ML IJ SOLN
INTRAMUSCULAR | Status: DC | PRN
  Administered 2018-03-09: 4 mg via INTRAVENOUS

## 2018-03-09 MED ORDER — LIDOCAINE-EPINEPHRINE 1 %-1:100000 IJ SOLN
INTRAMUSCULAR | Status: AC
  Filled 2018-03-09: qty 1

## 2018-03-09 MED ORDER — 0.9 % SODIUM CHLORIDE (POUR BTL) OPTIME
TOPICAL | Status: DC | PRN
  Administered 2018-03-09: 1000 mL

## 2018-03-09 MED ORDER — GLYCOPYRROLATE 0.2 MG/ML IJ SOLN
INTRAMUSCULAR | Status: DC | PRN
  Administered 2018-03-09: 0.2 mg via INTRAVENOUS

## 2018-03-09 MED ORDER — EPHEDRINE SULFATE 50 MG/ML IJ SOLN
INTRAMUSCULAR | Status: DC | PRN
  Administered 2018-03-09: 10 mg via INTRAVENOUS
  Administered 2018-03-09: 5 mg via INTRAVENOUS

## 2018-03-09 MED ORDER — LIDOCAINE-EPINEPHRINE 1 %-1:100000 IJ SOLN
INTRAMUSCULAR | Status: DC | PRN
  Administered 2018-03-09: 2 mL

## 2018-03-09 MED ORDER — OXYMETAZOLINE HCL 0.05 % NA SOLN
NASAL | Status: AC
  Filled 2018-03-09: qty 15

## 2018-03-09 MED ORDER — NEOMYCIN-POLYMYXIN-DEXAMETH 3.5-10000-0.1 OP OINT
TOPICAL_OINTMENT | OPHTHALMIC | Status: AC
  Filled 2018-03-09: qty 3.5

## 2018-03-09 MED ORDER — LACTATED RINGERS IV SOLN
INTRAVENOUS | Status: DC
  Administered 2018-03-09: 08:00:00 via INTRAVENOUS

## 2018-03-09 MED ORDER — HYDROMORPHONE HCL 1 MG/ML IJ SOLN
INTRAMUSCULAR | Status: AC
  Administered 2018-03-09: 0.5 mg via INTRAVENOUS
  Filled 2018-03-09: qty 1

## 2018-03-09 MED ORDER — CLINDAMYCIN HCL 300 MG PO CAPS: 300.0000 mg | ORAL_CAPSULE | Freq: Three times a day (TID) | ORAL | 0 refills | Status: AC

## 2018-03-09 MED ORDER — PROPOFOL 10 MG/ML IV BOLUS
INTRAVENOUS | Status: AC
  Filled 2018-03-09: qty 20

## 2018-03-09 MED ORDER — LIDOCAINE HCL (CARDIAC) 20 MG/ML IV SOLN
INTRAVENOUS | Status: DC | PRN
  Administered 2018-03-09: 50 mg via INTRAVENOUS

## 2018-03-09 MED ORDER — CEFAZOLIN SODIUM-DEXTROSE 2-4 GM/100ML-% IV SOLN
INTRAVENOUS | Status: AC
  Filled 2018-03-09: qty 100

## 2018-03-09 MED ORDER — PHENYLEPHRINE HCL 10 MG/ML IJ SOLN
INTRAVENOUS | Status: DC | PRN
  Administered 2018-03-09: 20 ug/min via INTRAVENOUS

## 2018-03-09 MED ORDER — HYDROMORPHONE HCL 1 MG/ML IJ SOLN
0.2500 mg | INTRAMUSCULAR | Status: DC | PRN
  Administered 2018-03-09 (×2): 0.5 mg via INTRAVENOUS

## 2018-03-09 MED ORDER — CEFAZOLIN SODIUM-DEXTROSE 2-4 GM/100ML-% IV SOLN: 2.0000 g | Freq: Once | INTRAVENOUS | Status: DC

## 2018-03-09 MED ORDER — DEXAMETHASONE SODIUM PHOSPHATE 10 MG/ML IJ SOLN
10.0000 mg | Freq: Once | INTRAMUSCULAR | Status: AC
  Administered 2018-03-09: 10 mg via INTRAVENOUS
  Filled 2018-03-09: qty 1

## 2018-03-09 MED ORDER — SUCCINYLCHOLINE CHLORIDE 20 MG/ML IJ SOLN
INTRAMUSCULAR | Status: DC | PRN
  Administered 2018-03-09: 100 mg via INTRAVENOUS

## 2018-03-09 MED ORDER — PROPOFOL 10 MG/ML IV BOLUS
INTRAVENOUS | Status: DC | PRN
  Administered 2018-03-09: 150 mg via INTRAVENOUS

## 2018-03-09 MED ORDER — PROPOFOL 10 MG/ML IV BOLUS
INTRAVENOUS | Status: AC
  Filled 2018-03-09: qty 40

## 2018-03-09 SURGICAL SUPPLY — 21 items
CANISTER SUCT 3000ML PPV (MISCELLANEOUS) ×2 IMPLANT
COVER SURGICAL LIGHT HANDLE (MISCELLANEOUS) ×2 IMPLANT
DECANTER SPIKE VIAL GLASS SM (MISCELLANEOUS) ×2 IMPLANT
DRAPE HALF SHEET 40X57 (DRAPES) ×2 IMPLANT
ELECT COATED BLADE 2.86 ST (ELECTRODE) ×2 IMPLANT
GLOVE BIOGEL M 7.0 STRL (GLOVE) ×2 IMPLANT
GOWN STRL REUS W/ TWL LRG LVL3 (GOWN DISPOSABLE) ×2 IMPLANT
GOWN STRL REUS W/TWL LRG LVL3 (GOWN DISPOSABLE) ×2
KIT BASIN OR (CUSTOM PROCEDURE TRAY) ×2 IMPLANT
KIT TURNOVER KIT B (KITS) ×2 IMPLANT
NEEDLE HYPO 25GX1X1/2 BEV (NEEDLE) ×2 IMPLANT
NS IRRIG 1000ML POUR BTL (IV SOLUTION) ×2 IMPLANT
PAD ARMBOARD 7.5X6 YLW CONV (MISCELLANEOUS) ×4 IMPLANT
PENCIL BUTTON HOLSTER BLD 10FT (ELECTRODE) ×2 IMPLANT
SCREW UPPER FACE 2.0X12MM (Screw) ×4 IMPLANT
SCREW UPPER FACE 2.0X8MM (Screw) ×4 IMPLANT
SUT STEEL 1 (SUTURE) ×2 IMPLANT
SYR CONTROL 10ML LL (SYRINGE) ×2 IMPLANT
TRAY ENT MC OR (CUSTOM PROCEDURE TRAY) ×2 IMPLANT
TUBE CONNECTING 12X1/4 (SUCTIONS) ×2 IMPLANT
YANKAUER SUCT BULB TIP NO VENT (SUCTIONS) ×2 IMPLANT

## 2018-03-09 NOTE — Progress Notes (Signed)
Per Dr. Ola Spurr ok to get I- STAT 8 instead of BMET

## 2018-03-09 NOTE — Anesthesia Procedure Notes (Signed)
Procedure Name: Intubation Date/Time: 03/09/2018 9:25 AM Performed by: Adalberto Ill, CRNA Pre-anesthesia Checklist: Patient identified, Emergency Drugs available, Suction available, Patient being monitored and Timeout performed Patient Re-evaluated:Patient Re-evaluated prior to induction Oxygen Delivery Method: Circle system utilized Preoxygenation: Pre-oxygenation with 100% oxygen Induction Type: IV induction Ventilation: Mask ventilation without difficulty Laryngoscope Size: 3 and Glidescope Grade View: Grade I Nasal Tubes: Nasal prep performed and Nasal Rae Tube size: 7.5 mm Number of attempts: 1 Airway Equipment and Method: Video-laryngoscopy Placement Confirmation: ETT inserted through vocal cords under direct vision,  positive ETCO2 and breath sounds checked- equal and bilateral Secured at: 26 cm Tube secured with: Tape Dental Injury: Teeth and Oropharynx as per pre-operative assessment

## 2018-03-09 NOTE — Addendum Note (Signed)
Addendum  created 03/09/18 1338 by Bryson Corona, CRNA   Intraprocedure Event edited

## 2018-03-09 NOTE — Transfer of Care (Signed)
Immediate Anesthesia Transfer of Care Note  Patient: Tony Campos  Procedure(s) Performed: CLOSED REDUCTION MANDIBULAR MMF (N/A Mouth)  Patient Location: PACU  Anesthesia Type:General  Level of Consciousness: awake, alert , oriented and patient cooperative  Airway & Oxygen Therapy: Patient Spontanous Breathing and Patient connected to nasal cannula oxygen  Post-op Assessment: Report given to RN and Post -op Vital signs reviewed and stable  Post vital signs: Reviewed and stable  Last Vitals:  Vitals Value Taken Time  BP 146/78 03/09/2018 10:19 AM  Temp    Pulse 91 03/09/2018 10:21 AM  Resp 14 03/09/2018 10:21 AM  SpO2 90 % 03/09/2018 10:21 AM  Vitals shown include unvalidated device data.  Last Pain:  Vitals:   03/09/18 0749  TempSrc: Oral  PainSc: 6       Patients Stated Pain Goal: 5 (75/79/72 8206)  Complications: No apparent anesthesia complications

## 2018-03-09 NOTE — Anesthesia Preprocedure Evaluation (Addendum)
Anesthesia Evaluation  Patient identified by MRN, date of birth, ID band Patient awake    Reviewed: Allergy & Precautions, H&P , NPO status , Patient's Chart, lab work & pertinent test results, reviewed documented beta blocker date and time   Airway Mallampati: III  TM Distance: >3 FB Neck ROM: Full  Mouth opening: Limited Mouth Opening  Dental no notable dental hx. (+) Poor Dentition, Dental Advisory Given   Pulmonary sleep apnea and Continuous Positive Airway Pressure Ventilation , Current Smoker,    Pulmonary exam normal breath sounds clear to auscultation       Cardiovascular hypertension, Pt. on medications and Pt. on home beta blockers + Peripheral Vascular Disease   Rhythm:Regular Rate:Normal     Neuro/Psych  Headaches, Anxiety Depression    GI/Hepatic Neg liver ROS, GERD  Medicated and Controlled,  Endo/Other  Hypothyroidism   Renal/GU negative Renal ROS  negative genitourinary   Musculoskeletal  (+) Arthritis , Osteoarthritis,    Abdominal   Peds  Hematology negative hematology ROS (+)   Anesthesia Other Findings   Reproductive/Obstetrics negative OB ROS                            Anesthesia Physical Anesthesia Plan  ASA: III  Anesthesia Plan: General   Post-op Pain Management:    Induction: Intravenous  PONV Risk Score and Plan: 2 and Ondansetron, Dexamethasone and Midazolam  Airway Management Planned: Nasal ETT and Video Laryngoscope Planned  Additional Equipment:   Intra-op Plan:   Post-operative Plan: Extubation in OR  Informed Consent: I have reviewed the patients History and Physical, chart, labs and discussed the procedure including the risks, benefits and alternatives for the proposed anesthesia with the patient or authorized representative who has indicated his/her understanding and acceptance.   Dental advisory given  Plan Discussed with:  CRNA  Anesthesia Plan Comments:         Anesthesia Quick Evaluation

## 2018-03-09 NOTE — Anesthesia Postprocedure Evaluation (Signed)
Anesthesia Post Note  Patient: Tony Campos  Procedure(s) Performed: CLOSED REDUCTION MANDIBULAR MMF (N/A Mouth)     Patient location during evaluation: PACU Anesthesia Type: General Level of consciousness: awake and alert Pain management: pain level controlled Vital Signs Assessment: post-procedure vital signs reviewed and stable Respiratory status: spontaneous breathing, nonlabored ventilation, respiratory function stable and patient connected to nasal cannula oxygen Cardiovascular status: blood pressure returned to baseline and stable Postop Assessment: no apparent nausea or vomiting Anesthetic complications: no    Last Vitals:  Vitals:   03/09/18 1049 03/09/18 1100  BP: 117/80   Pulse: 81 85  Resp: 15 18  Temp:    SpO2: 97% 98%    Last Pain:  Vitals:   03/09/18 0749  TempSrc: Oral  PainSc: 6                  Jerrad Mendibles,W. EDMOND
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
## 2018-03-11 ENCOUNTER — Encounter (HOSPITAL_COMMUNITY): Payer: Self-pay | Admitting: Otolaryngology

## 2018-03-11 ENCOUNTER — Ambulatory Visit: Payer: Medicare HMO | Admitting: Family Medicine

## 2018-03-11 NOTE — Addendum Note (Signed)
Addendum  created 03/11/18 2841 by Bryson Corona, CRNA   Intraprocedure Event edited

## 2018-03-18 ENCOUNTER — Ambulatory Visit: Payer: Medicare HMO | Admitting: Family Medicine

## 2018-03-21 DIAGNOSIS — G894 Chronic pain syndrome: Secondary | ICD-10-CM | POA: Diagnosis not present

## 2018-03-21 DIAGNOSIS — M62830 Muscle spasm of back: Secondary | ICD-10-CM | POA: Diagnosis not present

## 2018-03-21 DIAGNOSIS — M47816 Spondylosis without myelopathy or radiculopathy, lumbar region: Secondary | ICD-10-CM | POA: Diagnosis not present

## 2018-03-21 DIAGNOSIS — M47812 Spondylosis without myelopathy or radiculopathy, cervical region: Secondary | ICD-10-CM | POA: Diagnosis not present

## 2018-03-23 ENCOUNTER — Other Ambulatory Visit: Payer: Self-pay | Admitting: Family Medicine

## 2018-03-23 DIAGNOSIS — B009 Herpesviral infection, unspecified: Secondary | ICD-10-CM

## 2018-03-27 DIAGNOSIS — S02641D Fracture of ramus of right mandible, subsequent encounter for fracture with routine healing: Secondary | ICD-10-CM | POA: Insufficient documentation

## 2018-03-31 ENCOUNTER — Encounter: Payer: Self-pay | Admitting: Neurology

## 2018-04-03 ENCOUNTER — Ambulatory Visit: Payer: Medicare HMO | Admitting: Neurology

## 2018-04-03 ENCOUNTER — Encounter: Payer: Self-pay | Admitting: Neurology

## 2018-04-03 VITALS — BP 139/88 | HR 70 | Ht 72.0 in | Wt 198.0 lb

## 2018-04-03 DIAGNOSIS — Z9114 Patient's other noncompliance with medication regimen: Secondary | ICD-10-CM | POA: Insufficient documentation

## 2018-04-03 DIAGNOSIS — Z72821 Inadequate sleep hygiene: Secondary | ICD-10-CM | POA: Diagnosis not present

## 2018-04-03 DIAGNOSIS — Z91199 Patient's noncompliance with other medical treatment and regimen due to unspecified reason: Secondary | ICD-10-CM | POA: Insufficient documentation

## 2018-04-03 DIAGNOSIS — S02609D Fracture of mandible, unspecified, subsequent encounter for fracture with routine healing: Secondary | ICD-10-CM
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
## 2018-04-03 NOTE — Patient Instructions (Signed)

## 2018-04-05 DIAGNOSIS — S065X9A Traumatic subdural hemorrhage with loss of consciousness of unspecified duration, initial encounter: Secondary | ICD-10-CM | POA: Diagnosis not present

## 2018-04-05 DIAGNOSIS — Z6826 Body mass index (BMI) 26.0-26.9, adult: Secondary | ICD-10-CM | POA: Diagnosis not present

## 2018-04-11 ENCOUNTER — Other Ambulatory Visit (HOSPITAL_COMMUNITY): Payer: Self-pay | Admitting: Neurosurgery

## 2018-04-11 DIAGNOSIS — S065X9A Traumatic subdural hemorrhage with loss of consciousness of unspecified duration, initial encounter: Secondary | ICD-10-CM

## 2018-04-11 DIAGNOSIS — S065XAA Traumatic subdural hemorrhage with loss of consciousness status unknown, initial encounter: Secondary | ICD-10-CM

## 2018-04-12 ENCOUNTER — Other Ambulatory Visit: Payer: Self-pay | Admitting: Family Medicine

## 2018-04-12 DIAGNOSIS — I10 Essential (primary) hypertension: Secondary | ICD-10-CM

## 2018-04-23 DIAGNOSIS — M47816 Spondylosis without myelopathy or radiculopathy, lumbar region: Secondary | ICD-10-CM | POA: Diagnosis not present

## 2018-04-23 DIAGNOSIS — G894 Chronic pain syndrome: Secondary | ICD-10-CM | POA: Diagnosis not present

## 2018-04-23 DIAGNOSIS — M47812 Spondylosis without myelopathy or radiculopathy, cervical region: Secondary | ICD-10-CM | POA: Diagnosis not present

## 2018-04-23 DIAGNOSIS — M62830 Muscle spasm of back: Secondary | ICD-10-CM | POA: Diagnosis not present

## 2018-04-29 ENCOUNTER — Ambulatory Visit (HOSPITAL_COMMUNITY)
Admission: RE | Admit: 2018-04-29 | Discharge: 2018-04-29 | Disposition: A | Discharge status: Home / Self Care | Payer: Medicare HMO | Source: Ambulatory Visit | Attending: Neurosurgery | Admitting: Neurosurgery

## 2018-04-29 DIAGNOSIS — Z8661 Personal history of infections of the central nervous system: Secondary | ICD-10-CM | POA: Insufficient documentation

## 2018-04-29 DIAGNOSIS — S065X9A Traumatic subdural hemorrhage with loss of consciousness of unspecified duration, initial encounter: Secondary | ICD-10-CM | POA: Diagnosis not present

## 2018-04-29 DIAGNOSIS — R51 Headache: Secondary | ICD-10-CM | POA: Diagnosis not present

## 2018-04-29 DIAGNOSIS — S065XAA Traumatic subdural hemorrhage with loss of consciousness status unknown, initial encounter: Secondary | ICD-10-CM

## 2018-05-02 ENCOUNTER — Encounter (HOSPITAL_BASED_OUTPATIENT_CLINIC_OR_DEPARTMENT_OTHER): Payer: Self-pay | Admitting: *Deleted

## 2018-05-02 ENCOUNTER — Other Ambulatory Visit: Payer: Self-pay | Admitting: Otolaryngology

## 2018-05-02 ENCOUNTER — Other Ambulatory Visit: Payer: Self-pay

## 2018-05-03 ENCOUNTER — Encounter (HOSPITAL_BASED_OUTPATIENT_CLINIC_OR_DEPARTMENT_OTHER)
Admission: RE | Admit: 2018-05-03 | Discharge: 2018-05-03 | Disposition: A | Discharge status: Home / Self Care | Payer: Medicare HMO | Source: Ambulatory Visit | Attending: Otolaryngology | Admitting: Otolaryngology

## 2018-05-03 DIAGNOSIS — E291 Testicular hypofunction: Secondary | ICD-10-CM | POA: Diagnosis not present

## 2018-05-03 DIAGNOSIS — M479 Spondylosis, unspecified: Secondary | ICD-10-CM | POA: Diagnosis not present

## 2018-05-03 DIAGNOSIS — Z9049 Acquired absence of other specified parts of digestive tract: Secondary | ICD-10-CM | POA: Diagnosis not present

## 2018-05-03 DIAGNOSIS — E222 Syndrome of inappropriate secretion of antidiuretic hormone: Secondary | ICD-10-CM | POA: Diagnosis not present

## 2018-05-03 DIAGNOSIS — F419 Anxiety disorder, unspecified: Secondary | ICD-10-CM | POA: Diagnosis not present

## 2018-05-03 DIAGNOSIS — G4730 Sleep apnea, unspecified: Secondary | ICD-10-CM | POA: Diagnosis not present

## 2018-05-03 DIAGNOSIS — Z79899 Other long term (current) drug therapy: Secondary | ICD-10-CM | POA: Diagnosis not present

## 2018-05-03 DIAGNOSIS — I959 Hypotension, unspecified: Secondary | ICD-10-CM | POA: Diagnosis not present

## 2018-05-03 DIAGNOSIS — Z8661 Personal history of infections of the central nervous system: Secondary | ICD-10-CM | POA: Diagnosis not present

## 2018-05-03 DIAGNOSIS — Z464 Encounter for fitting and adjustment of orthodontic device: Secondary | ICD-10-CM | POA: Diagnosis not present

## 2018-05-03 DIAGNOSIS — Z7901 Long term (current) use of anticoagulants: Secondary | ICD-10-CM | POA: Diagnosis not present

## 2018-05-03 DIAGNOSIS — E039 Hypothyroidism, unspecified: Secondary | ICD-10-CM | POA: Diagnosis not present

## 2018-05-03 DIAGNOSIS — K219 Gastro-esophageal reflux disease without esophagitis: Secondary | ICD-10-CM | POA: Diagnosis not present

## 2018-05-03 DIAGNOSIS — G4489 Other headache syndrome: Secondary | ICD-10-CM | POA: Diagnosis not present

## 2018-05-03 DIAGNOSIS — I739 Peripheral vascular disease, unspecified: Secondary | ICD-10-CM | POA: Diagnosis not present

## 2018-05-03 DIAGNOSIS — Z800 Family history of malignant neoplasm of digestive organs: Secondary | ICD-10-CM | POA: Diagnosis not present

## 2018-05-03 DIAGNOSIS — F418 Other specified anxiety disorders: Secondary | ICD-10-CM | POA: Diagnosis not present

## 2018-05-03 DIAGNOSIS — F17210 Nicotine dependence, cigarettes, uncomplicated: Secondary | ICD-10-CM | POA: Diagnosis not present

## 2018-05-03 DIAGNOSIS — M1990 Unspecified osteoarthritis, unspecified site: Secondary | ICD-10-CM | POA: Diagnosis not present

## 2018-05-03 DIAGNOSIS — R69 Illness, unspecified: Secondary | ICD-10-CM | POA: Diagnosis not present

## 2018-05-03 DIAGNOSIS — S02609A Fracture of mandible, unspecified, initial encounter for closed fracture: Secondary | ICD-10-CM | POA: Diagnosis present

## 2018-05-03 DIAGNOSIS — I10 Essential (primary) hypertension: Secondary | ICD-10-CM | POA: Diagnosis not present

## 2018-05-03 DIAGNOSIS — F329 Major depressive disorder, single episode, unspecified: Secondary | ICD-10-CM | POA: Diagnosis not present

## 2018-05-03 LAB — BASIC METABOLIC PANEL
Anion gap: 7 (ref 5–15)
BUN: 5 mg/dL — AB (ref 6–20)
CALCIUM: 10 mg/dL (ref 8.9–10.3)
CO2: 32 mmol/L (ref 22–32)
CREATININE: 0.96 mg/dL (ref 0.61–1.24)
Chloride: 102 mmol/L (ref 101–111)
GFR calc Af Amer: >60 mL/min (ref >60)
Glucose, Bld: 101 mg/dL — ABNORMAL HIGH (ref 65–99)
Potassium: 5.3 mmol/L — ABNORMAL HIGH (ref 3.5–5.1)
SODIUM: 141 mmol/L (ref 135–145)

## 2018-05-03 NOTE — Pre-Procedure Instructions (Signed)
Dr. Lissa Hoard notified of K+ of 5.3 on labs done today.  Repeat K+ DOS, per Dr. Lissa Hoard.

## 2018-05-06 ENCOUNTER — Encounter (HOSPITAL_BASED_OUTPATIENT_CLINIC_OR_DEPARTMENT_OTHER): Payer: Self-pay | Admitting: *Deleted

## 2018-05-06 ENCOUNTER — Ambulatory Visit (HOSPITAL_BASED_OUTPATIENT_CLINIC_OR_DEPARTMENT_OTHER)
Admission: RE | Admit: 2018-05-06 | Discharge: 2018-05-06 | Disposition: A | Discharge status: Home / Self Care | Payer: Medicare HMO | Source: Ambulatory Visit | Attending: Otolaryngology | Admitting: Otolaryngology

## 2018-05-06 ENCOUNTER — Other Ambulatory Visit: Payer: Self-pay

## 2018-05-06 ENCOUNTER — Ambulatory Visit (HOSPITAL_BASED_OUTPATIENT_CLINIC_OR_DEPARTMENT_OTHER): Payer: Medicare HMO | Admitting: Certified Registered Nurse Anesthetist

## 2018-05-06 ENCOUNTER — Encounter (HOSPITAL_BASED_OUTPATIENT_CLINIC_OR_DEPARTMENT_OTHER)
Admission: RE | Disposition: A | Discharge status: Home / Self Care | Payer: Self-pay | Source: Ambulatory Visit | Attending: Otolaryngology

## 2018-05-06 DIAGNOSIS — F419 Anxiety disorder, unspecified: Secondary | ICD-10-CM | POA: Diagnosis not present

## 2018-05-06 DIAGNOSIS — Z9049 Acquired absence of other specified parts of digestive tract: Secondary | ICD-10-CM | POA: Insufficient documentation

## 2018-05-06 DIAGNOSIS — R69 Illness, unspecified: Secondary | ICD-10-CM | POA: Diagnosis not present

## 2018-05-06 DIAGNOSIS — E039 Hypothyroidism, unspecified: Secondary | ICD-10-CM | POA: Insufficient documentation

## 2018-05-06 DIAGNOSIS — I959 Hypotension, unspecified: Secondary | ICD-10-CM | POA: Insufficient documentation

## 2018-05-06 DIAGNOSIS — K219 Gastro-esophageal reflux disease without esophagitis: Secondary | ICD-10-CM | POA: Insufficient documentation

## 2018-05-06 DIAGNOSIS — F329 Major depressive disorder, single episode, unspecified: Secondary | ICD-10-CM | POA: Diagnosis not present

## 2018-05-06 DIAGNOSIS — Z79899 Other long term (current) drug therapy: Secondary | ICD-10-CM | POA: Insufficient documentation

## 2018-05-06 DIAGNOSIS — Z800 Family history of malignant neoplasm of digestive organs: Secondary | ICD-10-CM | POA: Insufficient documentation

## 2018-05-06 DIAGNOSIS — E785 Hyperlipidemia, unspecified: Secondary | ICD-10-CM | POA: Diagnosis not present

## 2018-05-06 DIAGNOSIS — Z8661 Personal history of infections of the central nervous system: Secondary | ICD-10-CM | POA: Insufficient documentation

## 2018-05-06 DIAGNOSIS — Z464 Encounter for fitting and adjustment of orthodontic device: Secondary | ICD-10-CM | POA: Insufficient documentation

## 2018-05-06 DIAGNOSIS — G4489 Other headache syndrome: Secondary | ICD-10-CM | POA: Insufficient documentation

## 2018-05-06 DIAGNOSIS — S02641D Fracture of ramus of right mandible, subsequent encounter for fracture with routine healing: Secondary | ICD-10-CM | POA: Diagnosis not present

## 2018-05-06 DIAGNOSIS — Z472 Encounter for removal of internal fixation device: Secondary | ICD-10-CM | POA: Diagnosis not present

## 2018-05-06 DIAGNOSIS — E291 Testicular hypofunction: Secondary | ICD-10-CM | POA: Insufficient documentation

## 2018-05-06 DIAGNOSIS — E222 Syndrome of inappropriate secretion of antidiuretic hormone: Secondary | ICD-10-CM | POA: Diagnosis not present

## 2018-05-06 DIAGNOSIS — M479 Spondylosis, unspecified: Secondary | ICD-10-CM | POA: Insufficient documentation

## 2018-05-06 DIAGNOSIS — G4730 Sleep apnea, unspecified: Secondary | ICD-10-CM | POA: Diagnosis not present

## 2018-05-06 DIAGNOSIS — Z7901 Long term (current) use of anticoagulants: Secondary | ICD-10-CM | POA: Insufficient documentation

## 2018-05-06 DIAGNOSIS — I10 Essential (primary) hypertension: Secondary | ICD-10-CM | POA: Insufficient documentation

## 2018-05-06 DIAGNOSIS — F418 Other specified anxiety disorders: Secondary | ICD-10-CM | POA: Insufficient documentation

## 2018-05-06 DIAGNOSIS — I739 Peripheral vascular disease, unspecified: Secondary | ICD-10-CM | POA: Diagnosis not present

## 2018-05-06 DIAGNOSIS — M1990 Unspecified osteoarthritis, unspecified site: Secondary | ICD-10-CM | POA: Insufficient documentation

## 2018-05-06 DIAGNOSIS — F17210 Nicotine dependence, cigarettes, uncomplicated: Secondary | ICD-10-CM | POA: Insufficient documentation

## 2018-05-06 HISTORY — PX: MANDIBULAR HARDWARE REMOVAL: SHX5205

## 2018-05-06 LAB — POCT I-STAT, CHEM 8
BUN: 3 mg/dL — AB (ref 6–20)
CHLORIDE: 100 mmol/L — AB (ref 101–111)
CREATININE: 1 mg/dL (ref 0.61–1.24)
Calcium, Ion: 1.22 mmol/L (ref 1.15–1.40)
GLUCOSE: 78 mg/dL (ref 65–99)
HEMATOCRIT: 31 % — AB (ref 39.0–52.0)
Hemoglobin: 10.5 g/dL — ABNORMAL LOW (ref 13.0–17.0)
POTASSIUM: 4.3 mmol/L (ref 3.5–5.1)
Sodium: 140 mmol/L (ref 135–145)
TCO2: 29 mmol/L (ref 22–32)

## 2018-05-06 SURGERY — REMOVAL, HARDWARE, MANDIBLE
Anesthesia: Monitor Anesthesia Care | Site: Mouth

## 2018-05-06 MED ORDER — FENTANYL CITRATE (PF) 100 MCG/2ML IJ SOLN
INTRAMUSCULAR | Status: AC
  Filled 2018-05-06: qty 2

## 2018-05-06 MED ORDER — OXYCODONE HCL 5 MG PO TABS: 5.0000 mg | ORAL_TABLET | Freq: Once | ORAL | Status: DC | PRN

## 2018-05-06 MED ORDER — MIDAZOLAM HCL 5 MG/5ML IJ SOLN
INTRAMUSCULAR | Status: DC | PRN
  Administered 2018-05-06: 2 mg via INTRAVENOUS

## 2018-05-06 MED ORDER — FENTANYL CITRATE (PF) 100 MCG/2ML IJ SOLN: 50.0000 ug | INTRAMUSCULAR | Status: DC | PRN

## 2018-05-06 MED ORDER — PROPOFOL 10 MG/ML IV BOLUS
INTRAVENOUS | Status: DC | PRN
  Administered 2018-05-06 (×2): 20 mg via INTRAVENOUS

## 2018-05-06 MED ORDER — CHLORHEXIDINE GLUCONATE CLOTH 2 % EX PADS: 6.0000 | MEDICATED_PAD | Freq: Once | CUTANEOUS | Status: DC

## 2018-05-06 MED ORDER — OXYCODONE HCL 5 MG/5ML PO SOLN: 5.0000 mg | Freq: Once | ORAL | Status: DC | PRN

## 2018-05-06 MED ORDER — MEPERIDINE HCL 25 MG/ML IJ SOLN: 6.2500 mg | INTRAMUSCULAR | Status: DC | PRN

## 2018-05-06 MED ORDER — FENTANYL CITRATE (PF) 100 MCG/2ML IJ SOLN
INTRAMUSCULAR | Status: DC | PRN
  Administered 2018-05-06 (×2): 50 ug via INTRAVENOUS

## 2018-05-06 MED ORDER — SCOPOLAMINE 1 MG/3DAYS TD PT72: 1.0000 | MEDICATED_PATCH | Freq: Once | TRANSDERMAL | Status: DC | PRN

## 2018-05-06 MED ORDER — ONDANSETRON HCL 4 MG/2ML IJ SOLN
INTRAMUSCULAR | Status: AC
  Filled 2018-05-06: qty 2

## 2018-05-06 MED ORDER — ONDANSETRON HCL 4 MG/2ML IJ SOLN
INTRAMUSCULAR | Status: DC | PRN
  Administered 2018-05-06: 4 mg via INTRAVENOUS

## 2018-05-06 MED ORDER — PROMETHAZINE HCL 25 MG/ML IJ SOLN: 6.2500 mg | INTRAMUSCULAR | Status: DC | PRN

## 2018-05-06 MED ORDER — CEPHALEXIN 500 MG PO CAPS: 500.0000 mg | ORAL_CAPSULE | Freq: Three times a day (TID) | ORAL | 1 refills | Status: DC

## 2018-05-06 MED ORDER — PROPOFOL 10 MG/ML IV BOLUS
INTRAVENOUS | Status: AC
  Filled 2018-05-06: qty 20

## 2018-05-06 MED ORDER — FENTANYL CITRATE (PF) 100 MCG/2ML IJ SOLN: 25.0000 ug | INTRAMUSCULAR | Status: DC | PRN

## 2018-05-06 MED ORDER — MIDAZOLAM HCL 2 MG/2ML IJ SOLN: 1.0000 mg | INTRAMUSCULAR | Status: DC | PRN

## 2018-05-06 MED ORDER — PROPOFOL 500 MG/50ML IV EMUL
INTRAVENOUS | Status: DC | PRN
  Administered 2018-05-06: 75 ug/kg/min via INTRAVENOUS

## 2018-05-06 MED ORDER — LIDOCAINE-EPINEPHRINE 1 %-1:100000 IJ SOLN
INTRAMUSCULAR | Status: DC | PRN
  Administered 2018-05-06: 2 mL

## 2018-05-06 MED ORDER — MIDAZOLAM HCL 2 MG/2ML IJ SOLN
INTRAMUSCULAR | Status: AC
Start: 2018-05-06 — End: ?
  Filled 2018-05-06: qty 2

## 2018-05-06 MED ORDER — LACTATED RINGERS IV SOLN
INTRAVENOUS | Status: DC
  Administered 2018-05-06: 10:00:00 via INTRAVENOUS

## 2018-05-06 SURGICAL SUPPLY — 30 items
BLADE SURG 15 STRL LF DISP TIS (BLADE) IMPLANT
BLADE SURG 15 STRL SS (BLADE)
CANISTER SUCT 1200ML W/VALVE (MISCELLANEOUS) ×2 IMPLANT
COVER MAYO STAND STRL (DRAPES) ×2 IMPLANT
DECANTER SPIKE VIAL GLASS SM (MISCELLANEOUS) ×2 IMPLANT
ELECT COATED BLADE 2.86 ST (ELECTRODE) ×2 IMPLANT
ELECT REM PT RETURN 9FT ADLT (ELECTROSURGICAL) ×2
ELECTRODE REM PT RTRN 9FT ADLT (ELECTROSURGICAL) ×1 IMPLANT
GAUZE SPONGE 4X4 12PLY STRL LF (GAUZE/BANDAGES/DRESSINGS) ×4 IMPLANT
GAUZE SPONGE 4X4 16PLY XRAY LF (GAUZE/BANDAGES/DRESSINGS) IMPLANT
GLOVE BIOGEL M 7.0 STRL (GLOVE) ×2 IMPLANT
GLOVE ECLIPSE 6.5 STRL STRAW (GLOVE) ×2 IMPLANT
GOWN STRL REUS W/ TWL LRG LVL3 (GOWN DISPOSABLE) ×1 IMPLANT
GOWN STRL REUS W/TWL LRG LVL3 (GOWN DISPOSABLE) ×1
MARKER SKIN DUAL TIP RULER LAB (MISCELLANEOUS) IMPLANT
NEEDLE PRECISIONGLIDE 27X1.5 (NEEDLE) ×2 IMPLANT
NS IRRIG 1000ML POUR BTL (IV SOLUTION) ×2 IMPLANT
PACK BASIN DAY SURGERY FS (CUSTOM PROCEDURE TRAY) ×2 IMPLANT
PENCIL BUTTON HOLSTER BLD 10FT (ELECTRODE) ×2 IMPLANT
SCISSORS WIRE ANG 4 3/4 DISP (INSTRUMENTS) IMPLANT
SHEET MEDIUM DRAPE 40X70 STRL (DRAPES) ×2 IMPLANT
SUT CHROMIC 3 0 PS 2 (SUTURE) IMPLANT
SUT CHROMIC 4 0 PS 2 18 (SUTURE) IMPLANT
SUT CHROMIC 4 0 RB 1X27 (SUTURE) ×2 IMPLANT
SYR BULB 3OZ (MISCELLANEOUS) ×2 IMPLANT
SYR CONTROL 10ML LL (SYRINGE) ×2 IMPLANT
TOWEL GREEN STERILE FF (TOWEL DISPOSABLE) IMPLANT
TRAY DSU PREP LF (CUSTOM PROCEDURE TRAY) IMPLANT
TUBE CONNECTING 20X1/4 (TUBING) ×2 IMPLANT
YANKAUER SUCT BULB TIP NO VENT (SUCTIONS) ×2 IMPLANT

## 2018-05-06 NOTE — Anesthesia Postprocedure Evaluation (Signed)
Anesthesia Post Note  Patient: Tony Campos  Procedure(s) Performed: REMOVAL OF MMF HARDWARE (N/A Mouth)     Patient location during evaluation: PACU Anesthesia Type: MAC Level of consciousness: awake and alert Pain management: pain level controlled Vital Signs Assessment: post-procedure vital signs reviewed and stable Respiratory status: spontaneous breathing Cardiovascular status: stable Anesthetic complications: no    Last Vitals:  Vitals:   05/06/18 1054 05/06/18 1100  BP: 102/65 107/69  Pulse: 68 66  Resp: 14 13  Temp: 36.8 C   SpO2: 96% 95%    Last Pain:  Vitals:   05/06/18 1054  TempSrc:   PainSc: [name]

## 2018-05-06 NOTE — Op Note (Signed)
Operative Note: MANDIBULO-MAXILLARY FIXATION  Patient: Tony Campos  Medical record number: [id number]  Date:05/06/2018  Pre-operative Indications: Mandible fracture  Postoperative Indications: Same  Surgical Procedure: Mandibulo-Maxillary Fixation Removal  Anesthesia: GET  Surgeon: Delsa Bern, M.D.  Complications: None  EBL: Min   Brief History: The patient is a 52 y.o. male with a history of acute mandible fracture.  Pt underwent mandibulomaxillary fixation for management of mandible fracture.  After adequate time for appropriate healing the patient presents for removal of mandibulomaxillary fixation hardware.  Given the patient's history and findings, I recommended mandibulo-maxillary fixation hardware removal under sedated anesthesia, risks and benefits were discussed in detail with the patient and family. They understand and agree with our plan for surgery which is scheduled at [facility] day [facility].  Surgical Procedure: The patient is brought to the operating room on 05/06/2018 and placed in supine position on the operating table.  Sedated/MAC anesthesia was established without difficulty. When the patient was adequately anesthetized, surgical timeout was performed and correct identification of the patient and the surgical procedure. The patient was positioned and prepped and draped in sterile fashion.  The patient was injected with 2 cc of 1% lidocaine 1 100,000 dilution epinephrine in a submucosal fashion along the anticipated incision lines.    Mandibular maxillary fixation wires were then cut.  Bovie electrocautery was used to incise the mucosa overlying the mandibular maxillary fixation screws.  Screws were removed along with fixation wiring.  No significant bleeding.  Good occlusion.  The patient's incisions were closed with interrupted 4-0 chromic suture.  An orogastric tube was passed and stomach contents were aspirated. Patient was awakened from  anesthetic, extubated and transferred from the operating room to the recovery room in stable condition. There were no complications and blood loss was minimal.   Delsa Bern, M.D. [facility] ENT 05/06/2018

## 2018-05-06 NOTE — H&P (Signed)
Tony Campos is an 52 y.o. male.   Chief Complaint: Mandible fracture HPI: Hx of Mandible fracture with MMF  Past Medical History:  Diagnosis Date  . Acid reflux   . Anxiety   . Depression   . Genital warts   . Hyperlipidemia   . Hypertension   . Hypothyroidism   . Meningitis   . PAD (peripheral artery disease) ([place])   . SIADH (syndrome of inappropriate ADH production) ([place])   . Sleep apnea 12/2015   uses bipap  . Smoker   . Smoker   . Spondylosis   . Testosterone deficiency     Past Surgical History:  Procedure Laterality Date  . CHOLECYSTECTOMY  2007  . CLOSED REDUCTION MANDIBLE N/A 03/09/2018   Procedure: CLOSED REDUCTION MANDIBULAR MMF;  Surgeon: Jerrell Belfast, MD;  Location: [place];  Service: ENT;  Laterality: N/A;  . KNEE SURGERY     right  . NASAL SEPTUM SURGERY    . NASAL SINUS SURGERY    . right sfa recanalization and stenting      [facility] 12/2015  . WRIST SURGERY     right and left    Family History  Problem Relation Age of Onset  . Cancer Maternal Grandfather        stomach   Social History:  reports that he has been smoking cigarettes.  He has been smoking about 1.00 pack per day. He has never used smokeless tobacco. He reports that he does not drink alcohol or use drugs.  Allergies:  Allergies  Allergen Reactions  . Testosterone     Edema from topical applications    Medications Prior to Admission  Medication Sig Dispense Refill  . Armodafinil 250 MG tablet Take 250 mg by mouth daily.   1  . aspirin 81 MG tablet Take 81 mg by mouth daily.    Marland Kitchen buPROPion (WELLBUTRIN XL) 150 MG 24 hr tablet Take 450 mg by mouth daily.    . clonazePAM (KLONOPIN) 1 MG tablet Take 1 mg by mouth 2 (two) times daily.     . clopidogrel (PLAVIX) 75 MG tablet Take 75 mg by mouth daily.    . fentaNYL (DURAGESIC - DOSED MCG/HR) 25 MCG/HR patch Place 50 mcg onto the skin every 3 (three) days.   0  . lamoTRIgine (LAMICTAL) 200 MG tablet Take 200 mg by mouth daily.    Marland Kitchen  levothyroxine (SYNTHROID, LEVOTHROID) 125 MCG tablet TAKE 1 TABLET BY MOUTH EVERY MORNING BEFORE BREAKFAST. 30 tablet 3  . losartan (COZAAR) 50 MG tablet TAKE (1) TABLET BY MOUTH ONCE DAILY. 90 tablet 2  . methocarbamol (ROBAXIN) 750 MG tablet Take 750 mg by mouth 2 (two) times daily.    . metoprolol tartrate (LOPRESSOR) 25 MG tablet TAKE (1) TABLET TWICE DAILY. 180 tablet 2  . morphine (MSIR) 15 MG tablet Take 15 mg by mouth every 4 (four) hours as needed for severe pain.    . niacin (NIASPAN) 500 MG CR tablet Take 500 mg by mouth daily.   2  . pantoprazole (PROTONIX) 40 MG tablet TAKE (1) TABLET TWICE DAILY. 60 tablet 0  . potassium chloride (K-DUR) 10 MEQ tablet TAKE 1 TABLET TWICE A DAY AS NEEDED WITH LASIX. (Patient taking differently: TAKE 1 TABLET DAILY) 60 tablet 2  . valACYclovir (VALTREX) 500 MG tablet TAKE (1) TABLET BY MOUTH ONCE DAILY. 30 tablet 0  . vortioxetine HBr (TRINTELLIX) 10 MG TABS tablet Take 10 mg by mouth daily.    Marland Kitchen  VYVANSE 70 MG capsule Take 70 mg by mouth daily.   0  . furosemide (LASIX) 40 MG tablet Take 40mg  BID prn (Patient taking differently: Take 80 mg by mouth daily as needed for fluid. ) 60 tablet 6  . rosuvastatin (CRESTOR) 40 MG tablet Take 1 tablet (40 mg total) by mouth daily. 90 tablet 3    Results for orders placed or performed during the hospital encounter of 05/06/18 (from the past 48 hour(s))  I-STAT, chem 8     Status: Abnormal   Collection Time: 05/06/18 10:04 AM  Result Value Ref Range   Sodium 140 135 - 145 mmol/L   Potassium 4.3 3.5 - 5.1 mmol/L   Chloride 100 (L) 101 - 111 mmol/L   BUN 3 (L) 6 - 20 mg/dL   Creatinine, Ser 1.00 0.61 - 1.24 mg/dL   Glucose, Bld 78 65 - 99 mg/dL   Calcium, Ion 1.22 1.15 - 1.40 mmol/L   TCO2 29 22 - 32 mmol/L   Hemoglobin 10.5 (L) 13.0 - 17.0 g/dL   HCT 31.0 (L) 39.0 - 52.0 %   No results found.  Review of Systems  Constitutional: Negative.   HENT: Negative.   Respiratory: Negative.   Cardiovascular:  Negative.     Blood pressure (!) 90/59, pulse 68, temperature 98.6 F (37 C), temperature source Oral, resp. rate 20, height 6' (1.829 m), weight 85.3 kg (188 lb), SpO2 100 %. Physical Exam  Constitutional: He appears well-developed and well-nourished.  Neck: Normal range of motion. Neck supple.  Cardiovascular: Normal rate.  Respiratory: Effort normal.     Assessment/Plan Adm for OP removal of MMF  Rashmi Tallent, Arran, MD 05/06/2018, 10:23 AM

## 2018-05-06 NOTE — Anesthesia Preprocedure Evaluation (Signed)
Anesthesia Evaluation  Patient identified by MRN, date of birth, ID band Patient awake    Reviewed: Allergy & Precautions, H&P , NPO status , Patient's Chart, lab work & pertinent test results, reviewed documented beta blocker date and time   Airway Mallampati: III  TM Distance: >3 FB Neck ROM: Full  Mouth opening: Limited Mouth Opening  Dental no notable dental hx. (+) Poor Dentition, Dental Advisory Given   Pulmonary sleep apnea and Continuous Positive Airway Pressure Ventilation , Current Smoker,    Pulmonary exam normal breath sounds clear to auscultation       Cardiovascular hypertension, Pt. on medications and Pt. on home beta blockers + Peripheral Vascular Disease   Rhythm:Regular Rate:Normal     Neuro/Psych  Headaches, Anxiety Depression    GI/Hepatic Neg liver ROS, GERD  Medicated and Controlled,  Endo/Other  Hypothyroidism   Renal/GU negative Renal ROS  negative genitourinary   Musculoskeletal  (+) Arthritis , Osteoarthritis,    Abdominal   Peds  Hematology negative hematology ROS (+)   Anesthesia Other Findings   Reproductive/Obstetrics negative OB ROS                             Anesthesia Physical  Anesthesia Plan  ASA: III  Anesthesia Plan: MAC   Post-op Pain Management:    Induction: Intravenous  PONV Risk Score and Plan: 1 and Ondansetron, Dexamethasone and Midazolam  Airway Management Planned:   Additional Equipment:   Intra-op Plan:   Post-operative Plan:   Informed Consent: I have reviewed the patients History and Physical, chart, labs and discussed the procedure including the risks, benefits and alternatives for the proposed anesthesia with the patient or authorized representative who has indicated his/her understanding and acceptance.   Dental advisory given  Plan Discussed with: CRNA  Anesthesia Plan Comments:         Anesthesia Quick  Evaluation

## 2018-05-06 NOTE — Discharge Instructions (Signed)

## 2018-05-06 NOTE — Transfer of Care (Signed)
Immediate Anesthesia Transfer of Care Note  Patient: Tony Campos  Procedure(s) Performed: REMOVAL OF MMF HARDWARE (N/A Mouth)  Patient Location: PACU  Anesthesia Type:MAC  Level of Consciousness: awake, alert  and oriented  Airway & Oxygen Therapy: Patient Spontanous Breathing and Patient connected to nasal cannula oxygen  Post-op Assessment: Report given to RN and Post -op Vital signs reviewed and stable  Post vital signs: Reviewed and stable  Last Vitals:  Vitals Value Taken Time  BP    Temp    Pulse 68 05/06/2018 10:54 AM  Resp 14 05/06/2018 10:54 AM  SpO2 96 % 05/06/2018 10:54 AM  Vitals shown include unvalidated device data.  Last Pain:  Vitals:   05/06/18 0943  TempSrc: Oral  PainSc: 6          Complications: No apparent anesthesia complications

## 2018-05-07 ENCOUNTER — Encounter (HOSPITAL_BASED_OUTPATIENT_CLINIC_OR_DEPARTMENT_OTHER): Payer: Self-pay | Admitting: Otolaryngology

## 2018-05-17 ENCOUNTER — Other Ambulatory Visit: Payer: Self-pay | Admitting: Family Medicine

## 2018-05-17 DIAGNOSIS — B009 Herpesviral infection, unspecified: Secondary | ICD-10-CM

## 2018-05-24 ENCOUNTER — Ambulatory Visit (INDEPENDENT_AMBULATORY_CARE_PROVIDER_SITE_OTHER): Payer: Medicare HMO | Admitting: Family Medicine

## 2018-05-24 ENCOUNTER — Other Ambulatory Visit: Payer: Self-pay

## 2018-05-24 ENCOUNTER — Ambulatory Visit (HOSPITAL_COMMUNITY)
Admission: RE | Admit: 2018-05-24 | Discharge: 2018-05-24 | Disposition: A | Discharge status: Home / Self Care | Payer: Medicare HMO | Source: Ambulatory Visit | Attending: Family Medicine | Admitting: Family Medicine

## 2018-05-24 ENCOUNTER — Encounter (HOSPITAL_COMMUNITY): Payer: Self-pay

## 2018-05-24 ENCOUNTER — Encounter: Payer: Self-pay | Admitting: Family Medicine

## 2018-05-24 VITALS — BP 130/72 | HR 85 | Temp 98.2°F | Resp 16 | Ht 72.0 in | Wt 199.8 lb

## 2018-05-24 DIAGNOSIS — R233 Spontaneous ecchymoses: Secondary | ICD-10-CM

## 2018-05-24 DIAGNOSIS — R1084 Generalized abdominal pain: Secondary | ICD-10-CM | POA: Insufficient documentation

## 2018-05-24 DIAGNOSIS — K5732 Diverticulitis of large intestine without perforation or abscess without bleeding: Secondary | ICD-10-CM | POA: Insufficient documentation

## 2018-05-24 DIAGNOSIS — R197 Diarrhea, unspecified: Secondary | ICD-10-CM

## 2018-05-24 DIAGNOSIS — N281 Cyst of kidney, acquired: Secondary | ICD-10-CM | POA: Diagnosis not present

## 2018-05-24 DIAGNOSIS — K6289 Other specified diseases of anus and rectum: Secondary | ICD-10-CM | POA: Diagnosis not present

## 2018-05-24 DIAGNOSIS — R109 Unspecified abdominal pain: Secondary | ICD-10-CM | POA: Diagnosis not present

## 2018-05-24 LAB — CBC WITH DIFFERENTIAL/PLATELET
BASOS ABS: 46 {cells}/uL (ref 0–200)
Basophils Relative: 0.4 %
EOS ABS: 125 {cells}/uL (ref 15–500)
EOS PCT: 1.1 %
HEMATOCRIT: 32.2 % — AB (ref 38.5–50.0)
HEMOGLOBIN: 10.8 g/dL — AB (ref 13.2–17.1)
LYMPHS ABS: 2827 {cells}/uL (ref 850–3900)
MCH: 30.1 pg (ref 27.0–33.0)
MCHC: 33.5 g/dL (ref 32.0–36.0)
MCV: 89.7 fL (ref 80.0–100.0)
MPV: 10 fL (ref 7.5–12.5)
Monocytes Relative: 7.8 %
NEUTROS ABS: 7513 {cells}/uL (ref 1500–7800)
NEUTROS PCT: 65.9 %
Platelets: 226 10*3/uL (ref 140–400)
RBC: 3.59 10*6/uL — ABNORMAL LOW (ref 4.20–5.80)
RDW: 13.1 % (ref 11.0–15.0)
Total Lymphocyte: 24.8 %
WBC: 11.4 10*3/uL — ABNORMAL HIGH (ref 3.8–10.8)
WBCMIX: 889 {cells}/uL (ref 200–950)

## 2018-05-24 LAB — COMPLETE METABOLIC PANEL WITH GFR
AG RATIO: 2 (calc) (ref 1.0–2.5)
ALBUMIN MSPROF: 3.9 g/dL (ref 3.6–5.1)
ALKALINE PHOSPHATASE (APISO): 82 U/L (ref 40–115)
ALT: 5 U/L — ABNORMAL LOW (ref 9–46)
AST: 11 U/L (ref 10–40)
BUN: 8 mg/dL (ref 7–25)
CHLORIDE: 105 mmol/L (ref 98–110)
CO2: 30 mmol/L (ref 20–32)
Calcium: 8.9 mg/dL (ref 8.6–10.3)
Creat: 0.82 mg/dL (ref 0.60–1.35)
GFR, Est African American: 120 mL/min/{1.73_m2} (ref >=60)
GFR, Est Non African American: 104 mL/min/{1.73_m2} (ref >=60)
Globulin: 2 g/dL (calc) (ref 1.9–3.7)
Glucose, Bld: 80 mg/dL (ref 65–99)
Potassium: 3.7 mmol/L (ref 3.5–5.3)
Sodium: 140 mmol/L (ref 135–146)
Total Bilirubin: 0.4 mg/dL (ref 0.2–1.2)
Total Protein: 5.9 g/dL — ABNORMAL LOW (ref 6.1–8.1)

## 2018-05-24 LAB — URINALYSIS, ROUTINE W REFLEX MICROSCOPIC
Bilirubin Urine: NEGATIVE
Glucose, UA: NEGATIVE
HGB URINE DIPSTICK: NEGATIVE
KETONES UR: NEGATIVE
LEUKOCYTES UA: NEGATIVE
NITRITE: NEGATIVE
PROTEIN: NEGATIVE
Specific Gravity, Urine: 1.01 (ref 1.001–1.03)
pH: 6.5 (ref 5.0–8.0)

## 2018-05-24 LAB — APTT: APTT: 33 s (ref 22–34)

## 2018-05-24 LAB — PROTIME-INR
INR: 1
PROTHROMBIN TIME: 11 s (ref 9.0–11.5)

## 2018-05-24 LAB — LIPASE: Lipase: 6 U/L — ABNORMAL LOW (ref 7–60)

## 2018-05-24 MED ORDER — CIPROFLOXACIN HCL 500 MG PO TABS
500.0000 mg | ORAL_TABLET | Freq: Two times a day (BID) | ORAL | 0 refills | Status: DC
Start: 2018-05-24 — End: 2018-06-10

## 2018-05-24 MED ORDER — METRONIDAZOLE 500 MG PO TABS: 500.0000 mg | ORAL_TABLET | Freq: Three times a day (TID) | ORAL | 0 refills | Status: AC

## 2018-05-24 MED ORDER — IOPAMIDOL (ISOVUE-300) INJECTION 61%
100.0000 mL | Freq: Once | INTRAVENOUS | Status: AC | PRN
  Administered 2018-05-24: 100 mL via INTRAVENOUS

## 2018-05-24 MED ORDER — IOPAMIDOL (ISOVUE-300) INJECTION 61%
30.0000 mL | Freq: Once | INTRAVENOUS | Status: AC | PRN
  Administered 2018-05-24: 30 mL via ORAL

## 2018-05-24 NOTE — Patient Instructions (Signed)
Concern for abdominal pain and profuse diarrhea.  Most suspicious is gastroenteritis with colitis or diverticulitis.    Labs and imaging to evaluate.  Also did blood clotting studies because of your bruising and bleeding.    We will call you with labs and with CT results and know more in a few hours.   Abdominal Pain, Adult Abdominal pain can be caused by many things. Often, abdominal pain is not serious and it gets better with no treatment or by being treated at home. However, sometimes abdominal pain is serious. Your health care provider will do a medical history and a physical exam to try to determine the cause of your abdominal pain. Follow these instructions at home:  Take over-the-counter and prescription medicines only as told by your health care provider. Do not take a laxative unless told by your health care provider.  Drink enough fluid to keep your urine clear or pale yellow.  Watch your condition for any changes.  Keep all follow-up visits as told by your health care provider. This is important. Contact a health care provider if:  Your abdominal pain changes or gets worse.  You are not hungry or you lose weight without trying.  You are constipated or have diarrhea for more than 2-3 days.  You have pain when you urinate or have a bowel movement.  Your abdominal pain wakes you up at night.  Your pain gets worse with meals, after eating, or with certain foods.  You are throwing up and cannot keep anything down.  You have a fever. Get help right away if:  Your pain does not go away as soon as your health care provider told you to expect.  You cannot stop throwing up.  Your pain is only in areas of the abdomen, such as the right side or the left lower portion of the abdomen.  You have bloody or black stools, or stools that look like tar.  You have severe pain, cramping, or bloating in your abdomen.  You have signs of dehydration, such as: ? Dark urine, very  little urine, or no urine. ? Cracked lips. ? Dry mouth. ? Sunken eyes. ? Sleepiness. ? Weakness. This information is not intended to replace advice given to you by your health care provider. Make sure you discuss any questions you have with your health care provider. Document Released: 08/23/2005 Document Revised: 06/02/2016 Document Reviewed: 04/26/2016 Elsevier Interactive Patient Education  [employer].
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
## 2018-06-05 ENCOUNTER — Other Ambulatory Visit: Payer: Self-pay | Admitting: Family Medicine

## 2018-06-06 DIAGNOSIS — M47812 Spondylosis without myelopathy or radiculopathy, cervical region: Secondary | ICD-10-CM | POA: Diagnosis not present

## 2018-06-06 DIAGNOSIS — Z79891 Long term (current) use of opiate analgesic: Secondary | ICD-10-CM | POA: Diagnosis not present

## 2018-06-06 DIAGNOSIS — G894 Chronic pain syndrome: Secondary | ICD-10-CM | POA: Diagnosis not present

## 2018-06-06 DIAGNOSIS — M62830 Muscle spasm of back: Secondary | ICD-10-CM | POA: Diagnosis not present

## 2018-06-06 DIAGNOSIS — M47816 Spondylosis without myelopathy or radiculopathy, lumbar region: Secondary | ICD-10-CM | POA: Diagnosis not present

## 2018-06-10 ENCOUNTER — Encounter: Payer: Self-pay | Admitting: Family Medicine

## 2018-06-10 ENCOUNTER — Ambulatory Visit (INDEPENDENT_AMBULATORY_CARE_PROVIDER_SITE_OTHER): Payer: Medicare HMO | Admitting: Family Medicine

## 2018-06-10 VITALS — BP 100/54 | HR 94 | Temp 99.1°F | Resp 14 | Ht 72.0 in | Wt 194.0 lb

## 2018-06-10 DIAGNOSIS — K5900 Constipation, unspecified: Secondary | ICD-10-CM

## 2018-06-10 DIAGNOSIS — K5792 Diverticulitis of intestine, part unspecified, without perforation or abscess without bleeding: Secondary | ICD-10-CM

## 2018-06-10 MED ORDER — METRONIDAZOLE 500 MG PO TABS
500.0000 mg | ORAL_TABLET | Freq: Two times a day (BID) | ORAL | 0 refills | Status: DC
Start: 2018-06-10 — End: 2018-07-23

## 2018-06-10 MED ORDER — CIPROFLOXACIN HCL 500 MG PO TABS: 500.0000 mg | ORAL_TABLET | Freq: Two times a day (BID) | ORAL | 0 refills | Status: DC

## 2018-06-10 NOTE — Progress Notes (Signed)
Subjective:    Patient ID: Tony Campos, male    DOB: [birth year], 51 y.o.   MRN: [id number]  HPI  Patient was seen by my partner on June 28 for left lower quadrant abdominal pain.  CT scan revealed diverticulitis of the sigmoid colon and rectum.  He is placed on 10 days of Cipro and Flagyl and initially his symptoms improved.  After discontinuing his antibiotics however, the patient has developed recurrent left lower quadrant abdominal pain.  He states the pain is been present for approximately half a week and is gradually getting worse.  However he is also not had a bowel movement during the last 4 days.  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  He discontinued the Movantik prior to his last visit because of diarrhea he was having with the diverticulitis.  He has not had a bowel movement in 4 days which is also likely contributing to his abdominal pain  Past Medical History:  Diagnosis Date  . Acid reflux   . Anxiety   . Depression   . Genital warts   . Hyperlipidemia   . Hypertension   . Hypothyroidism   . Meningitis   . PAD (peripheral artery disease) ([place])   . SIADH (syndrome of inappropriate ADH production) ([place])   . Sleep apnea 12/2015   uses bipap  . Smoker   . Smoker   . Spondylosis   . Testosterone deficiency    Past Surgical History:  Procedure Laterality Date  . CHOLECYSTECTOMY  2007  . CLOSED REDUCTION MANDIBLE N/A 03/09/2018   Procedure: CLOSED REDUCTION MANDIBULAR MMF;  Surgeon: Jerrell Belfast, MD;  Location: [facility];  Service: ENT;  Laterality: N/A;  . KNEE SURGERY     right  . MANDIBULAR HARDWARE REMOVAL N/A 05/06/2018   Procedure: REMOVAL OF MMF HARDWARE;  Surgeon: Jerrell Belfast, MD;  Location: [facility];  Service: ENT;  Laterality: N/A;  . NASAL SEPTUM SURGERY    . NASAL SINUS SURGERY    . right sfa recanalization and stenting      [facility] 12/2015  . WRIST SURGERY     right and left   Current Outpatient  Medications on File Prior to Visit  Medication Sig Dispense Refill  . Armodafinil 250 MG tablet Take 250 mg by mouth daily.   1  . buPROPion (WELLBUTRIN XL) 150 MG 24 hr tablet Take 450 mg by mouth daily.    . clonazePAM (KLONOPIN) 1 MG tablet Take 1 mg by mouth 2 (two) times daily.     . fentaNYL (DURAGESIC - DOSED MCG/HR) 25 MCG/HR patch Place 50 mcg onto the skin every 3 (three) days.   0  . furosemide (LASIX) 40 MG tablet Take 40mg  BID prn (Patient taking differently: Take 80 mg by mouth daily as needed for fluid. ) 60 tablet 6  . lamoTRIgine (LAMICTAL) 200 MG tablet Take 200 mg by mouth daily.    Marland Kitchen levothyroxine (SYNTHROID, LEVOTHROID) 125 MCG tablet TAKE 1 TABLET EVERY MORNING BEFORE BREAKFAST. 30 tablet 0  . losartan (COZAAR) 50 MG tablet TAKE (1) TABLET BY MOUTH ONCE DAILY. 90 tablet 2  . methocarbamol (ROBAXIN) 750 MG tablet Take 750 mg by mouth 2 (two) times daily.    . metoprolol tartrate (LOPRESSOR) 25 MG tablet TAKE (1) TABLET TWICE DAILY. 180 tablet 2  . morphine (MSIR) 15 MG tablet Take 15 mg by mouth every 4 (four) hours as needed for severe pain.    Marland Kitchen  niacin (NIASPAN) 500 MG CR tablet Take 500 mg by mouth daily.   2  . pantoprazole (PROTONIX) 40 MG tablet TAKE (1) TABLET TWICE DAILY. 60 tablet 0  . potassium chloride (K-DUR) 10 MEQ tablet TAKE 1 TABLET TWICE A DAY AS NEEDED WITH LASIX. (Patient taking differently: TAKE 1 TABLET DAILY) 60 tablet 2  . rosuvastatin (CRESTOR) 40 MG tablet Take 1 tablet (40 mg total) by mouth daily. 90 tablet 3  . TRINTELLIX 20 MG TABS tablet Take 20 mg by mouth daily.    . valACYclovir (VALTREX) 500 MG tablet TAKE (1) TABLET BY MOUTH ONCE DAILY. 30 tablet 0  . vortioxetine HBr (TRINTELLIX) 10 MG TABS tablet Take 10 mg by mouth daily.    Marland Kitchen VYVANSE 70 MG capsule Take 70 mg by mouth daily.   0  . aspirin 81 MG tablet Take 81 mg by mouth daily.    . clopidogrel (PLAVIX) 75 MG tablet Take 75 mg by mouth daily.    . [DISCONTINUED] esomeprazole (NEXIUM)  40 MG capsule Take 40 mg by mouth daily before breakfast.       No current facility-administered medications on file prior to visit.    Allergies  Allergen Reactions  . Testosterone     Edema from topical applications   Social History   Socioeconomic History  . Marital status: Married    Spouse name: Not on file  . Number of children: Not on file  . Years of education: Not on file  . Highest education level: Not on file  Occupational History  . Occupation: Training and development officer at [employer]  . Financial resource strain: Not on file  . Food insecurity:    Worry: Not on file    Inability: Not on file  . Transportation needs:    Medical: Not on file    Non-medical: Not on file  Tobacco Use  . Smoking status: Current Every Day Smoker    Packs/day: 1.00    Types: Cigarettes  . Smokeless tobacco: Never Used  Substance and Sexual Activity  . Alcohol use: No  . Drug use: No  . Sexual activity: Not on file  Lifestyle  . Physical activity:    Days per week: Not on file    Minutes per session: Not on file  . Stress: Not on file  Relationships  . Social connections:    Talks on phone: Not on file    Gets together: Not on file    Attends religious service: Not on file    Active member of club or organization: Not on file    Attends meetings of clubs or organizations: Not on file    Relationship status: Not on file  . Intimate partner violence:    Fear of current or ex partner: Not on file    Emotionally abused: Not on file    Physically abused: Not on file    Forced sexual activity: Not on file  Other Topics Concern  . Not on file  Social History Narrative  . Not on file     Review of Systems  All other systems reviewed and are negative.      Objective:   Physical Exam  Constitutional: He appears well-developed and well-nourished. No distress.  Eyes: Conjunctivae are normal.  Cardiovascular: Normal rate, regular rhythm and normal heart  sounds.  Pulmonary/Chest: Effort normal. He has no wheezes. He has no rales. He exhibits no tenderness.  Abdominal: Soft. Bowel sounds are  normal. He exhibits no distension. There is tenderness. There is no rebound and no guarding.  Musculoskeletal: He exhibits no edema.  Skin: He is not diaphoretic.  Vitals reviewed.         Assessment & Plan:  Diverticulitis  Constipation, unspecified constipation type  Difficult to determine if the patient has recurrent/incompletely treated diverticulitis versus pain due to constipation.  I have extended his antibiotics for an additional 10 days of Cipro 500 mg p.o. twice daily and Flagyl 500 mg p.o. twice daily.  I have asked the patient to resume Movantik immediately and then recheck in 1 week or sooner if worse.

## 2018-07-04 DIAGNOSIS — M62830 Muscle spasm of back: Secondary | ICD-10-CM | POA: Diagnosis not present

## 2018-07-04 DIAGNOSIS — M47816 Spondylosis without myelopathy or radiculopathy, lumbar region: Secondary | ICD-10-CM | POA: Diagnosis not present

## 2018-07-04 DIAGNOSIS — G894 Chronic pain syndrome: Secondary | ICD-10-CM | POA: Diagnosis not present

## 2018-07-04 DIAGNOSIS — M47812 Spondylosis without myelopathy or radiculopathy, cervical region: Secondary | ICD-10-CM | POA: Diagnosis not present

## 2018-07-14 ENCOUNTER — Other Ambulatory Visit: Payer: Self-pay | Admitting: Family Medicine

## 2018-07-16 ENCOUNTER — Other Ambulatory Visit: Payer: Self-pay | Admitting: Family Medicine

## 2018-07-16 MED ORDER — LEVOTHYROXINE SODIUM 125 MCG PO TABS: ORAL_TABLET | ORAL | 0 refills | Status: DC

## 2018-07-23 ENCOUNTER — Ambulatory Visit (INDEPENDENT_AMBULATORY_CARE_PROVIDER_SITE_OTHER): Payer: Medicare HMO | Admitting: Family Medicine

## 2018-07-23 ENCOUNTER — Encounter: Payer: Self-pay | Admitting: Family Medicine

## 2018-07-23 VITALS — BP 110/60 | HR 98 | Temp 98.8°F | Resp 12 | Ht 72.0 in | Wt 183.0 lb

## 2018-07-23 DIAGNOSIS — K5792 Diverticulitis of intestine, part unspecified, without perforation or abscess without bleeding: Secondary | ICD-10-CM | POA: Diagnosis not present

## 2018-07-23 DIAGNOSIS — K5732 Diverticulitis of large intestine without perforation or abscess without bleeding: Secondary | ICD-10-CM | POA: Diagnosis not present

## 2018-07-23 LAB — CBC WITH DIFFERENTIAL/PLATELET
Basophils Absolute: 43 cells/uL (ref 0–200)
Basophils Relative: 0.2 %
EOS ABS: 22 {cells}/uL (ref 15–500)
Eosinophils Relative: 0.1 %
HEMATOCRIT: 37.4 % — AB (ref 38.5–50.0)
Hemoglobin: 12.7 g/dL — ABNORMAL LOW (ref 13.2–17.1)
LYMPHS ABS: 2602 {cells}/uL (ref 850–3900)
MCH: 29.6 pg (ref 27.0–33.0)
MCHC: 34 g/dL (ref 32.0–36.0)
MCV: 87.2 fL (ref 80.0–100.0)
MPV: 11.2 fL (ref 7.5–12.5)
Monocytes Relative: 9.4 %
NEUTROS PCT: 78.2 %
Neutro Abs: 16813 cells/uL — ABNORMAL HIGH (ref 1500–7800)
Platelets: 170 10*3/uL (ref 140–400)
RBC: 4.29 10*6/uL (ref 4.20–5.80)
RDW: 13.1 % (ref 11.0–15.0)
TOTAL LYMPHOCYTE: 12.1 %
WBC: 21.5 10*3/uL — AB (ref 3.8–10.8)
WBCMIX: 2021 {cells}/uL — AB (ref 200–950)

## 2018-07-23 LAB — COMPLETE METABOLIC PANEL WITH GFR
AG RATIO: 2 (calc) (ref 1.0–2.5)
ALKALINE PHOSPHATASE (APISO): 87 U/L (ref 40–115)
ALT: 3 U/L — AB (ref 9–46)
AST: 10 U/L (ref 10–40)
Albumin: 3.8 g/dL (ref 3.6–5.1)
BUN: 11 mg/dL (ref 7–25)
CALCIUM: 9 mg/dL (ref 8.6–10.3)
CO2: 29 mmol/L (ref 20–32)
CREATININE: 0.86 mg/dL (ref 0.60–1.35)
Chloride: 100 mmol/L (ref 98–110)
GFR, Est African American: 118 mL/min/{1.73_m2} (ref >=60)
GFR, Est Non African American: 102 mL/min/{1.73_m2} (ref >=60)
GLOBULIN: 1.9 g/dL (ref 1.9–3.7)
Glucose, Bld: 138 mg/dL — ABNORMAL HIGH (ref 65–99)
Potassium: 3.4 mmol/L — ABNORMAL LOW (ref 3.5–5.3)
Sodium: 137 mmol/L (ref 135–146)
TOTAL PROTEIN: 5.7 g/dL — AB (ref 6.1–8.1)
Total Bilirubin: 0.6 mg/dL (ref 0.2–1.2)

## 2018-07-23 MED ORDER — AMOXICILLIN-POT CLAVULANATE 875-125 MG PO TABS: 1.0000 | ORAL_TABLET | Freq: Three times a day (TID) | ORAL | 0 refills | Status: DC

## 2018-07-23 MED ORDER — NALOXEGOL OXALATE 25 MG PO TABS: 25.0000 mg | ORAL_TABLET | Freq: Every day | ORAL | 0 refills | Status: DC

## 2018-07-23 NOTE — Progress Notes (Signed)
Subjective:    Patient ID: Tony Campos, male    DOB: [birth year], 52 y.o.   MRN: [id number]  HPI 06/10/18 Patient was seen by my partner on June 28 for left lower quadrant abdominal pain.  CT scan revealed diverticulitis of the sigmoid colon and rectum.  He is placed on 10 days of Cipro and Flagyl and initially his symptoms improved.  After discontinuing his antibiotics however, the patient has developed recurrent left lower quadrant abdominal pain.  He states the pain is been present for approximately half a week and is gradually getting worse.  However he is also not had a bowel movement during the last 4 days.  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  He discontinued the Movantik prior to his last visit because of diarrhea he was having with the diverticulitis.  He has not had a bowel movement in 4 days which is also likely contributing to his abdominal pain.  At that time, my plan was: Difficult to determine if the patient has recurrent/incompletely treated diverticulitis versus pain due to constipation.  I have extended his antibiotics for an additional 10 days of Cipro 500 mg p.o. twice daily and Flagyl 500 mg p.o. twice daily.  I have asked the patient to resume Movantik immediately and then recheck in 1 week or sooner if worse.  07/23/18 Patient states that while he takes antibiotics, his symptoms improve.  However after discontinuing antibiotics, his symptoms returned.  He again reports severe pain in the left lower quadrant.  Is made worse with food.  He states that he is basically just been eating crackers and drinking water due to the pain that he develops in the left lower quadrant when he eats.  He also reports occasional diarrhea followed by a severe constipation.  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  He states that he is not having a normal bowel movement.  He  denies any melena or hematochezia.  He reports subjective fevers.  Past Medical History:  Diagnosis Date  . Acid reflux   . Anxiety   . Depression   . Genital warts   . Hyperlipidemia   . Hypertension   . Hypothyroidism   . Meningitis   . PAD (peripheral artery disease) ([place])   . SIADH (syndrome of inappropriate ADH production) ([place])   . Sleep apnea 12/2015   uses bipap  . Smoker   . Smoker   . Spondylosis   . Testosterone deficiency    Past Surgical History:  Procedure Laterality Date  . CHOLECYSTECTOMY  2007  . CLOSED REDUCTION MANDIBLE N/A 03/09/2018   Procedure: CLOSED REDUCTION MANDIBULAR MMF;  Surgeon: Jerrell Belfast, MD;  Location: [facility];  Service: ENT;  Laterality: N/A;  . KNEE SURGERY     right  . MANDIBULAR HARDWARE REMOVAL N/A 05/06/2018   Procedure: REMOVAL OF MMF HARDWARE;  Surgeon: Jerrell Belfast, MD;  Location: [facility];  Service: ENT;  Laterality: N/A;  . NASAL SEPTUM SURGERY    . NASAL SINUS SURGERY    . right sfa recanalization and stenting      [facility] 12/2015  . WRIST SURGERY     right and left   Current Outpatient Medications on File Prior to Visit  Medication Sig Dispense Refill  . Armodafinil 250 MG tablet Take 250 mg by mouth daily.   1  . aspirin 81 MG tablet Take 81  mg by mouth daily.    Marland Kitchen buPROPion (WELLBUTRIN XL) 150 MG 24 hr tablet Take 450 mg by mouth daily.    . clonazePAM (KLONOPIN) 1 MG tablet Take 1 mg by mouth 2 (two) times daily.     . clopidogrel (PLAVIX) 75 MG tablet Take 75 mg by mouth daily.    . fentaNYL (DURAGESIC - DOSED MCG/HR) 25 MCG/HR patch Place 50 mcg onto the skin every 3 (three) days.   0  . furosemide (LASIX) 40 MG tablet Take 40mg  BID prn (Patient taking differently: Take 80 mg by mouth daily as needed for fluid. ) 60 tablet 6  . lamoTRIgine (LAMICTAL) 200 MG tablet Take 200 mg by mouth daily.    Marland Kitchen levothyroxine (SYNTHROID, LEVOTHROID) 125 MCG tablet TAKE 1 TABLET EVERY MORNING BEFORE BREAKFAST. 90  tablet 0  . losartan-hydrochlorothiazide (HYZAAR) 50-12.5 MG tablet TAKE 1 TABLET BY MOUTH EVERY DAY    . LYRICA 75 MG capsule 3 mg 3 (three) times daily.     . methocarbamol (ROBAXIN) 750 MG tablet Take 750 mg by mouth 2 (two) times daily.    . metoprolol tartrate (LOPRESSOR) 25 MG tablet TAKE (1) TABLET TWICE DAILY. 180 tablet 2  . morphine (MSIR) 15 MG tablet Take 15 mg by mouth every 4 (four) hours as needed for severe pain.    . niacin (NIASPAN) 500 MG CR tablet Take 500 mg by mouth daily.   2  . pantoprazole (PROTONIX) 40 MG tablet TAKE (1) TABLET TWICE DAILY. 60 tablet 5  . potassium chloride (K-DUR) 10 MEQ tablet TAKE 1 TABLET TWICE A DAY AS NEEDED WITH LASIX. (Patient taking differently: TAKE 1 TABLET DAILY) 60 tablet 2  . rosuvastatin (CRESTOR) 40 MG tablet Take 1 tablet (40 mg total) by mouth daily. 90 tablet 3  . TRINTELLIX 20 MG TABS tablet Take 20 mg by mouth daily.    . valACYclovir (VALTREX) 500 MG tablet TAKE (1) TABLET BY MOUTH ONCE DAILY. 30 tablet 0  . vortioxetine HBr (TRINTELLIX) 10 MG TABS tablet Take 10 mg by mouth daily.    Marland Kitchen VYVANSE 70 MG capsule Take 70 mg by mouth daily.   0  . [DISCONTINUED] esomeprazole (NEXIUM) 40 MG capsule Take 40 mg by mouth daily before breakfast.       No current facility-administered medications on file prior to visit.    Allergies  Allergen Reactions  . Testosterone     Edema from topical applications   Social History   Socioeconomic History  . Marital status: Married    Spouse name: Not on file  . Number of children: Not on file  . Years of education: Not on file  . Highest education level: Not on file  Occupational History  . Occupation: Training and development officer at [employer]  . Financial resource strain: Not on file  . Food insecurity:    Worry: Not on file    Inability: Not on file  . Transportation needs:    Medical: Not on file    Non-medical: Not on file  Tobacco Use  . Smoking status: Current  Every Day Smoker    Packs/day: 1.00    Types: Cigarettes  . Smokeless tobacco: Never Used  Substance and Sexual Activity  . Alcohol use: No  . Drug use: No  . Sexual activity: Not on file  Lifestyle  . Physical activity:    Days per week: Not on file    Minutes per session: Not  on file  . Stress: Not on file  Relationships  . Social connections:    Talks on phone: Not on file    Gets together: Not on file    Attends religious service: Not on file    Active member of club or organization: Not on file    Attends meetings of clubs or organizations: Not on file    Relationship status: Not on file  . Intimate partner violence:    Fear of current or ex partner: Not on file    Emotionally abused: Not on file    Physically abused: Not on file    Forced sexual activity: Not on file  Other Topics Concern  . Not on file  Social History Narrative  . Not on file     Review of Systems  All other systems reviewed and are negative.      Objective:   Physical Exam  Constitutional: He appears well-developed and well-nourished. No distress.  Eyes: Conjunctivae are normal.  Cardiovascular: Normal rate, regular rhythm and normal heart sounds.  Pulmonary/Chest: Effort normal. He has no wheezes. He has no rales. He exhibits no tenderness.  Abdominal: Soft. Bowel sounds are normal. He exhibits no distension. There is tenderness. There is no rebound and no guarding.  Musculoskeletal: He exhibits no edema.  Skin: He is not diaphoretic.  Vitals reviewed.         Assessment & Plan:  Diverticulitis - Plan: CBC with Differential/Platelet, COMPLETE METABOLIC PANEL WITH GFR  Diverticulitis of large intestine without perforation or abscess without bleeding - Plan: CT Abdomen Pelvis W Contrast Patient is nontoxic-appearing.  I will switch antibiotics to Augmentin 875 mg p.o. 3 times daily for 10 days.  I will obtain a CBC to evaluate for leukocytosis along with a CMP.  I will obtain a CT scan  of the abdomen and pelvis to evaluate for any possible perforation or abdominal abscess that would require drainage and surgical consultation.  I have strongly recommended the patient resume Movantik 25 mg p.o. daily for opiate induced constipation in addition to his Amitiza as I feel that constipation is playing a large role in his current abdominal pain

## 2018-07-25 ENCOUNTER — Emergency Department (HOSPITAL_COMMUNITY): Payer: Medicare HMO | Admitting: Anesthesiology

## 2018-07-25 ENCOUNTER — Other Ambulatory Visit: Payer: Self-pay

## 2018-07-25 ENCOUNTER — Observation Stay (HOSPITAL_COMMUNITY)
Admission: EM | Admit: 2018-07-25 | Discharge: 2018-07-26 | Disposition: A | Discharge status: Home / Self Care | Payer: Medicare HMO | Attending: General Surgery | Admitting: General Surgery

## 2018-07-25 ENCOUNTER — Other Ambulatory Visit: Payer: Self-pay | Admitting: Family Medicine

## 2018-07-25 ENCOUNTER — Ambulatory Visit (HOSPITAL_COMMUNITY)
Admission: RE | Admit: 2018-07-25 | Discharge: 2018-07-25 | Disposition: A | Discharge status: Home / Self Care | Payer: Medicare HMO | Source: Ambulatory Visit | Attending: Family Medicine | Admitting: Family Medicine

## 2018-07-25 ENCOUNTER — Encounter (HOSPITAL_COMMUNITY)
Admission: EM | Disposition: A | Discharge status: Home / Self Care | Payer: Self-pay | Source: Home / Self Care | Attending: Emergency Medicine

## 2018-07-25 ENCOUNTER — Encounter (HOSPITAL_COMMUNITY): Payer: Self-pay | Admitting: Emergency Medicine

## 2018-07-25 DIAGNOSIS — Z7902 Long term (current) use of antithrombotics/antiplatelets: Secondary | ICD-10-CM | POA: Insufficient documentation

## 2018-07-25 DIAGNOSIS — Z9049 Acquired absence of other specified parts of digestive tract: Secondary | ICD-10-CM | POA: Diagnosis not present

## 2018-07-25 DIAGNOSIS — E785 Hyperlipidemia, unspecified: Secondary | ICD-10-CM | POA: Insufficient documentation

## 2018-07-25 DIAGNOSIS — Z7982 Long term (current) use of aspirin: Secondary | ICD-10-CM | POA: Insufficient documentation

## 2018-07-25 DIAGNOSIS — K5732 Diverticulitis of large intestine without perforation or abscess without bleeding: Secondary | ICD-10-CM

## 2018-07-25 DIAGNOSIS — F419 Anxiety disorder, unspecified: Secondary | ICD-10-CM | POA: Insufficient documentation

## 2018-07-25 DIAGNOSIS — Z792 Long term (current) use of antibiotics: Secondary | ICD-10-CM | POA: Diagnosis not present

## 2018-07-25 DIAGNOSIS — Z79891 Long term (current) use of opiate analgesic: Secondary | ICD-10-CM | POA: Diagnosis not present

## 2018-07-25 DIAGNOSIS — F17210 Nicotine dependence, cigarettes, uncomplicated: Secondary | ICD-10-CM | POA: Diagnosis not present

## 2018-07-25 DIAGNOSIS — R109 Unspecified abdominal pain: Secondary | ICD-10-CM | POA: Diagnosis not present

## 2018-07-25 DIAGNOSIS — Z79890 Hormone replacement therapy (postmenopausal): Secondary | ICD-10-CM | POA: Diagnosis not present

## 2018-07-25 DIAGNOSIS — G4730 Sleep apnea, unspecified: Secondary | ICD-10-CM | POA: Insufficient documentation

## 2018-07-25 DIAGNOSIS — K3580 Unspecified acute appendicitis: Secondary | ICD-10-CM | POA: Diagnosis not present

## 2018-07-25 DIAGNOSIS — Z79899 Other long term (current) drug therapy: Secondary | ICD-10-CM | POA: Diagnosis not present

## 2018-07-25 DIAGNOSIS — E039 Hypothyroidism, unspecified: Secondary | ICD-10-CM | POA: Insufficient documentation

## 2018-07-25 DIAGNOSIS — F329 Major depressive disorder, single episode, unspecified: Secondary | ICD-10-CM | POA: Insufficient documentation

## 2018-07-25 DIAGNOSIS — R1084 Generalized abdominal pain: Secondary | ICD-10-CM

## 2018-07-25 DIAGNOSIS — R197 Diarrhea, unspecified: Secondary | ICD-10-CM | POA: Diagnosis not present

## 2018-07-25 DIAGNOSIS — K5792 Diverticulitis of intestine, part unspecified, without perforation or abscess without bleeding: Secondary | ICD-10-CM

## 2018-07-25 DIAGNOSIS — R1031 Right lower quadrant pain: Secondary | ICD-10-CM | POA: Diagnosis not present

## 2018-07-25 DIAGNOSIS — R69 Illness, unspecified: Secondary | ICD-10-CM | POA: Diagnosis not present

## 2018-07-25 DIAGNOSIS — I10 Essential (primary) hypertension: Secondary | ICD-10-CM | POA: Diagnosis not present

## 2018-07-25 HISTORY — PX: LAPAROSCOPIC APPENDECTOMY: SHX408

## 2018-07-25 HISTORY — DX: Diverticulitis of intestine, part unspecified, without perforation or abscess without bleeding: K57.92

## 2018-07-25 LAB — CBC WITH DIFFERENTIAL/PLATELET
Basophils Absolute: 0 10*3/uL (ref 0.0–0.1)
Basophils Relative: 0 %
Eosinophils Absolute: 0.1 10*3/uL (ref 0.0–0.7)
Eosinophils Relative: 1 %
HCT: 38 % — ABNORMAL LOW (ref 39.0–52.0)
Hemoglobin: 12.6 g/dL — ABNORMAL LOW (ref 13.0–17.0)
Lymphocytes Relative: 33 %
Lymphs Abs: 2.6 10*3/uL (ref 0.7–4.0)
MCH: 29.3 pg (ref 26.0–34.0)
MCHC: 33.2 g/dL (ref 30.0–36.0)
MCV: 88.4 fL (ref 78.0–100.0)
Monocytes Absolute: 0.5 10*3/uL (ref 0.1–1.0)
Monocytes Relative: 7 %
Neutro Abs: 4.7 10*3/uL (ref 1.7–7.7)
Neutrophils Relative %: 59 %
Platelets: 167 10*3/uL (ref 150–400)
RBC: 4.3 MIL/uL (ref 4.22–5.81)
RDW: 13.7 % (ref 11.5–15.5)
WBC: 8 10*3/uL (ref 4.0–10.5)

## 2018-07-25 LAB — BASIC METABOLIC PANEL
Anion gap: 7 (ref 5–15)
BUN: 8 mg/dL (ref 6–20)
CO2: 28 mmol/L (ref 22–32)
Calcium: 9.3 mg/dL (ref 8.9–10.3)
Chloride: 104 mmol/L (ref 98–111)
Creatinine, Ser: 0.61 mg/dL (ref 0.61–1.24)
GFR calc Af Amer: >60 mL/min (ref >60)
GFR calc non Af Amer: >60 mL/min (ref >60)
Glucose, Bld: 104 mg/dL — ABNORMAL HIGH (ref 70–99)
Potassium: 2.8 mmol/L — ABNORMAL LOW (ref 3.5–5.1)
Sodium: 139 mmol/L (ref 135–145)

## 2018-07-25 SURGERY — APPENDECTOMY, LAPAROSCOPIC
Anesthesia: General

## 2018-07-25 MED ORDER — VORTIOXETINE HBR 20 MG PO TABS
20.0000 mg | ORAL_TABLET | Freq: Every day | ORAL | Status: DC
  Filled 2018-07-25 (×4): qty 1

## 2018-07-25 MED ORDER — HYDROCODONE-ACETAMINOPHEN 7.5-325 MG PO TABS: 1.0000 | ORAL_TABLET | Freq: Once | ORAL | Status: DC | PRN

## 2018-07-25 MED ORDER — ONDANSETRON 4 MG PO TBDP: 4.0000 mg | ORAL_TABLET | Freq: Four times a day (QID) | ORAL | Status: DC | PRN

## 2018-07-25 MED ORDER — CHLORHEXIDINE GLUCONATE CLOTH 2 % EX PADS: 6.0000 | MEDICATED_PAD | Freq: Once | CUTANEOUS | Status: DC

## 2018-07-25 MED ORDER — FENTANYL CITRATE (PF) 100 MCG/2ML IJ SOLN: 12.5000 ug | INTRAMUSCULAR | Status: DC | PRN

## 2018-07-25 MED ORDER — CLONAZEPAM 0.5 MG PO TABS
1.0000 mg | ORAL_TABLET | Freq: Two times a day (BID) | ORAL | Status: DC
  Administered 2018-07-25 – 2018-07-26 (×2): 1 mg via ORAL
  Filled 2018-07-25 (×2): qty 2

## 2018-07-25 MED ORDER — ONDANSETRON HCL 4 MG/2ML IJ SOLN
INTRAMUSCULAR | Status: AC
  Filled 2018-07-25: qty 2

## 2018-07-25 MED ORDER — KETOROLAC TROMETHAMINE 30 MG/ML IJ SOLN: 30.0000 mg | Freq: Four times a day (QID) | INTRAMUSCULAR | Status: DC | PRN

## 2018-07-25 MED ORDER — SODIUM CHLORIDE 0.9 % IV SOLN
INTRAVENOUS | Status: DC
  Administered 2018-07-25: 17:00:00 via INTRAVENOUS

## 2018-07-25 MED ORDER — BUPIVACAINE LIPOSOME 1.3 % IJ SUSP
INTRAMUSCULAR | Status: AC
  Filled 2018-07-25: qty 20

## 2018-07-25 MED ORDER — FENTANYL 50 MCG/HR TD PT72
50.0000 ug | MEDICATED_PATCH | TRANSDERMAL | Status: DC
  Administered 2018-07-25: 50 ug via TRANSDERMAL
  Filled 2018-07-25: qty 1

## 2018-07-25 MED ORDER — MIDAZOLAM HCL 2 MG/2ML IJ SOLN
INTRAMUSCULAR | Status: AC
  Filled 2018-07-25: qty 2

## 2018-07-25 MED ORDER — GABAPENTIN 300 MG PO CAPS
600.0000 mg | ORAL_CAPSULE | Freq: Three times a day (TID) | ORAL | Status: DC
  Administered 2018-07-25 – 2018-07-26 (×2): 600 mg via ORAL
  Filled 2018-07-25 (×2): qty 2

## 2018-07-25 MED ORDER — LACTATED RINGERS IV SOLN
INTRAVENOUS | Status: DC
  Administered 2018-07-25: 20:00:00 via INTRAVENOUS

## 2018-07-25 MED ORDER — PANTOPRAZOLE SODIUM 40 MG PO TBEC
40.0000 mg | DELAYED_RELEASE_TABLET | Freq: Two times a day (BID) | ORAL | Status: DC
  Administered 2018-07-25 – 2018-07-26 (×2): 40 mg via ORAL
  Filled 2018-07-25 (×2): qty 1

## 2018-07-25 MED ORDER — POVIDONE-IODINE 10 % EX OINT
TOPICAL_OINTMENT | CUTANEOUS | Status: AC
  Filled 2018-07-25: qty 1

## 2018-07-25 MED ORDER — SUGAMMADEX SODIUM 500 MG/5ML IV SOLN
INTRAVENOUS | Status: DC | PRN
  Administered 2018-07-25: 166 mg via INTRAVENOUS

## 2018-07-25 MED ORDER — LORAZEPAM 2 MG/ML IJ SOLN: 1.0000 mg | INTRAMUSCULAR | Status: DC | PRN

## 2018-07-25 MED ORDER — HYDROMORPHONE HCL 1 MG/ML IJ SOLN
INTRAMUSCULAR | Status: AC
  Filled 2018-07-25: qty 0.5

## 2018-07-25 MED ORDER — LIDOCAINE HCL (CARDIAC) PF 50 MG/5ML IV SOSY
PREFILLED_SYRINGE | INTRAVENOUS | Status: DC | PRN
  Administered 2018-07-25: 50 mg via INTRAVENOUS

## 2018-07-25 MED ORDER — FENTANYL CITRATE (PF) 250 MCG/5ML IJ SOLN
INTRAMUSCULAR | Status: AC
  Filled 2018-07-25: qty 5

## 2018-07-25 MED ORDER — MIDAZOLAM HCL 2 MG/2ML IJ SOLN
INTRAMUSCULAR | Status: DC | PRN
  Administered 2018-07-25: 2 mg via INTRAVENOUS

## 2018-07-25 MED ORDER — ROSUVASTATIN CALCIUM 20 MG PO TABS: 40.0000 mg | ORAL_TABLET | Freq: Every day | ORAL | Status: DC

## 2018-07-25 MED ORDER — LISDEXAMFETAMINE DIMESYLATE 50 MG PO CAPS
70.0000 mg | ORAL_CAPSULE | Freq: Every day | ORAL | Status: DC
  Administered 2018-07-26: 70 mg via ORAL
  Filled 2018-07-25: qty 1

## 2018-07-25 MED ORDER — SIMETHICONE 80 MG PO CHEW: 40.0000 mg | CHEWABLE_TABLET | Freq: Four times a day (QID) | ORAL | Status: DC | PRN

## 2018-07-25 MED ORDER — EPHEDRINE SULFATE 50 MG/ML IJ SOLN
INTRAMUSCULAR | Status: AC
  Filled 2018-07-25: qty 1

## 2018-07-25 MED ORDER — POTASSIUM CHLORIDE 10 MEQ/100ML IV SOLN
10.0000 meq | INTRAVENOUS | Status: AC
  Administered 2018-07-25 – 2018-07-26 (×3): 10 meq via INTRAVENOUS
  Filled 2018-07-25 (×3): qty 100

## 2018-07-25 MED ORDER — IOPAMIDOL (ISOVUE-300) INJECTION 61%
100.0000 mL | Freq: Once | INTRAVENOUS | Status: AC | PRN
  Administered 2018-07-25: 100 mL via INTRAVENOUS

## 2018-07-25 MED ORDER — ONDANSETRON HCL 4 MG/2ML IJ SOLN: 4.0000 mg | Freq: Four times a day (QID) | INTRAMUSCULAR | Status: DC | PRN

## 2018-07-25 MED ORDER — PROPOFOL 10 MG/ML IV BOLUS
INTRAVENOUS | Status: AC
  Filled 2018-07-25: qty 20

## 2018-07-25 MED ORDER — LOSARTAN POTASSIUM 50 MG PO TABS
50.0000 mg | ORAL_TABLET | Freq: Every day | ORAL | Status: DC
  Administered 2018-07-26: 50 mg via ORAL
  Filled 2018-07-25: qty 1

## 2018-07-25 MED ORDER — FENTANYL CITRATE (PF) 100 MCG/2ML IJ SOLN
INTRAMUSCULAR | Status: DC | PRN
  Administered 2018-07-25: 100 ug via INTRAVENOUS
  Administered 2018-07-25: 50 ug via INTRAVENOUS
  Administered 2018-07-25: 100 ug via INTRAVENOUS
  Administered 2018-07-25: 250 ug via INTRAVENOUS
  Administered 2018-07-25: 100 ug via INTRAVENOUS
  Administered 2018-07-25: 150 ug via INTRAVENOUS

## 2018-07-25 MED ORDER — KETOROLAC TROMETHAMINE 30 MG/ML IJ SOLN
30.0000 mg | Freq: Four times a day (QID) | INTRAMUSCULAR | Status: AC
  Administered 2018-07-26: 30 mg via INTRAVENOUS
  Filled 2018-07-25: qty 1

## 2018-07-25 MED ORDER — GABAPENTIN 600 MG PO TABS
600.0000 mg | ORAL_TABLET | Freq: Three times a day (TID) | ORAL | Status: DC
  Filled 2018-07-25 (×2): qty 1

## 2018-07-25 MED ORDER — PIPERACILLIN-TAZOBACTAM 3.375 G IVPB 30 MIN
3.3750 g | Freq: Once | INTRAVENOUS | Status: AC
  Administered 2018-07-25: 3.375 g via INTRAVENOUS
  Filled 2018-07-25: qty 50

## 2018-07-25 MED ORDER — ONDANSETRON HCL 4 MG/2ML IJ SOLN
INTRAMUSCULAR | Status: DC | PRN
  Administered 2018-07-25: 4 mg via INTRAVENOUS

## 2018-07-25 MED ORDER — POTASSIUM CHLORIDE 10 MEQ/100ML IV SOLN
10.0000 meq | INTRAVENOUS | Status: DC
  Administered 2018-07-25 (×2): 10 meq via INTRAVENOUS
  Filled 2018-07-25: qty 100

## 2018-07-25 MED ORDER — ACETAMINOPHEN 650 MG RE SUPP: 650.0000 mg | Freq: Four times a day (QID) | RECTAL | Status: DC | PRN

## 2018-07-25 MED ORDER — ROCURONIUM BROMIDE 100 MG/10ML IV SOLN
INTRAVENOUS | Status: DC | PRN
  Administered 2018-07-25: 50 mg via INTRAVENOUS

## 2018-07-25 MED ORDER — ENOXAPARIN SODIUM 40 MG/0.4ML ~~LOC~~ SOLN
40.0000 mg | SUBCUTANEOUS | Status: DC
  Administered 2018-07-26: 40 mg via SUBCUTANEOUS
  Filled 2018-07-25: qty 0.4

## 2018-07-25 MED ORDER — DEXAMETHASONE SODIUM PHOSPHATE 4 MG/ML IJ SOLN
INTRAMUSCULAR | Status: AC
  Filled 2018-07-25: qty 1

## 2018-07-25 MED ORDER — KETOROLAC TROMETHAMINE 30 MG/ML IJ SOLN
30.0000 mg | Freq: Once | INTRAMUSCULAR | Status: AC
  Administered 2018-07-25: 30 mg via INTRAVENOUS

## 2018-07-25 MED ORDER — LIDOCAINE HCL (PF) 1 % IJ SOLN
INTRAMUSCULAR | Status: AC
  Filled 2018-07-25: qty 5

## 2018-07-25 MED ORDER — SODIUM CHLORIDE 0.9 % IJ SOLN
INTRAMUSCULAR | Status: AC
  Filled 2018-07-25: qty 10

## 2018-07-25 MED ORDER — PREGABALIN 75 MG PO CAPS
75.0000 mg | ORAL_CAPSULE | Freq: Two times a day (BID) | ORAL | Status: DC
  Administered 2018-07-25 – 2018-07-26 (×2): 75 mg via ORAL
  Filled 2018-07-25 (×2): qty 1

## 2018-07-25 MED ORDER — BUPIVACAINE LIPOSOME 1.3 % IJ SUSP
INTRAMUSCULAR | Status: DC | PRN
  Administered 2018-07-25: 20 mL

## 2018-07-25 MED ORDER — SODIUM CHLORIDE 0.9 % IR SOLN
Status: DC | PRN
  Administered 2018-07-25: 1000 mL

## 2018-07-25 MED ORDER — ROCURONIUM BROMIDE 50 MG/5ML IV SOLN
INTRAVENOUS | Status: AC
  Filled 2018-07-25: qty 1

## 2018-07-25 MED ORDER — KETOROLAC TROMETHAMINE 30 MG/ML IJ SOLN
INTRAMUSCULAR | Status: AC
  Filled 2018-07-25: qty 1

## 2018-07-25 MED ORDER — NICOTINE 21 MG/24HR TD PT24
21.0000 mg | MEDICATED_PATCH | Freq: Every day | TRANSDERMAL | Status: DC
  Administered 2018-07-25 – 2018-07-26 (×2): 21 mg via TRANSDERMAL
  Filled 2018-07-25 (×5): qty 1

## 2018-07-25 MED ORDER — PROPOFOL 10 MG/ML IV BOLUS
INTRAVENOUS | Status: DC | PRN
  Administered 2018-07-25: 200 mg via INTRAVENOUS

## 2018-07-25 MED ORDER — POVIDONE-IODINE 10 % OINT PACKET
TOPICAL_OINTMENT | CUTANEOUS | Status: DC | PRN
  Administered 2018-07-25: 1 via TOPICAL

## 2018-07-25 MED ORDER — ARMODAFINIL 250 MG PO TABS
250.0000 mg | ORAL_TABLET | Freq: Every day | ORAL | Status: DC
  Filled 2018-07-25 (×4): qty 1

## 2018-07-25 MED ORDER — LAMOTRIGINE 100 MG PO TABS
200.0000 mg | ORAL_TABLET | Freq: Two times a day (BID) | ORAL | Status: DC
  Administered 2018-07-25 – 2018-07-26 (×2): 200 mg via ORAL
  Filled 2018-07-25 (×2): qty 2

## 2018-07-25 MED ORDER — SUGAMMADEX SODIUM 200 MG/2ML IV SOLN
INTRAVENOUS | Status: AC
  Filled 2018-07-25: qty 2

## 2018-07-25 MED ORDER — DEXAMETHASONE SODIUM PHOSPHATE 10 MG/ML IJ SOLN
INTRAMUSCULAR | Status: DC | PRN
  Administered 2018-07-25: 4 mg via INTRAVENOUS

## 2018-07-25 MED ORDER — ACETAMINOPHEN 325 MG PO TABS: 650.0000 mg | ORAL_TABLET | Freq: Four times a day (QID) | ORAL | Status: DC | PRN

## 2018-07-25 MED ORDER — LACTATED RINGERS IV SOLN
INTRAVENOUS | Status: DC
  Administered 2018-07-25: via INTRAVENOUS

## 2018-07-25 MED ORDER — LEVOTHYROXINE SODIUM 25 MCG PO TABS
125.0000 ug | ORAL_TABLET | Freq: Every day | ORAL | Status: DC
  Administered 2018-07-26: 125 ug via ORAL
  Filled 2018-07-25: qty 1

## 2018-07-25 MED ORDER — FENTANYL CITRATE (PF) 100 MCG/2ML IJ SOLN: 25.0000 ug | INTRAMUSCULAR | Status: DC | PRN

## 2018-07-25 MED ORDER — MORPHINE SULFATE 15 MG PO TABS
15.0000 mg | ORAL_TABLET | Freq: Two times a day (BID) | ORAL | Status: DC
  Administered 2018-07-26: 15 mg via ORAL
  Filled 2018-07-25: qty 1

## 2018-07-25 MED ORDER — LABETALOL HCL 5 MG/ML IV SOLN
INTRAVENOUS | Status: AC
  Filled 2018-07-25: qty 4

## 2018-07-25 MED ORDER — PIPERACILLIN-TAZOBACTAM 3.375 G IVPB
3.3750 g | Freq: Three times a day (TID) | INTRAVENOUS | Status: DC
  Administered 2018-07-26 (×2): 3.375 g via INTRAVENOUS
  Filled 2018-07-25 (×2): qty 50

## 2018-07-25 MED ORDER — HYDROMORPHONE HCL 1 MG/ML IJ SOLN
0.5000 mg | INTRAMUSCULAR | Status: DC | PRN
  Administered 2018-07-25 (×3): 0.5 mg via INTRAVENOUS
  Filled 2018-07-25 (×2): qty 0.5

## 2018-07-25 MED ORDER — METHOCARBAMOL 500 MG PO TABS
750.0000 mg | ORAL_TABLET | Freq: Three times a day (TID) | ORAL | Status: DC
  Administered 2018-07-25 – 2018-07-26 (×2): 750 mg via ORAL
  Filled 2018-07-25 (×2): qty 2

## 2018-07-25 SURGICAL SUPPLY — 53 items
APPLICATOR ARISTA FLEXITIP XL (MISCELLANEOUS) ×2 IMPLANT
BAG RETRIEVAL 10 (BASKET) ×1
CHLORAPREP W/TINT 26ML (MISCELLANEOUS) ×2 IMPLANT
CLOTH BEACON ORANGE TIMEOUT ST (SAFETY) ×2 IMPLANT
COVER LIGHT HANDLE STERIS (MISCELLANEOUS) ×4 IMPLANT
CUTTER FLEX LINEAR 45M (STAPLE) ×2 IMPLANT
DECANTER SPIKE VIAL GLASS SM (MISCELLANEOUS) ×2 IMPLANT
DRSG TEGADERM 2.38X2.75 (GAUZE/BANDAGES/DRESSINGS) ×6 IMPLANT
ELECT REM PT RETURN 9FT ADLT (ELECTROSURGICAL) ×2
ELECTRODE REM PT RTRN 9FT ADLT (ELECTROSURGICAL) ×1 IMPLANT
EVACUATOR SMOKE 8.L (FILTER) ×2 IMPLANT
GLOVE BIOGEL M 6.5 STRL (GLOVE) ×2 IMPLANT
GLOVE BIOGEL PI IND STRL 6.5 (GLOVE) ×1 IMPLANT
GLOVE BIOGEL PI IND STRL 7.0 (GLOVE) ×2 IMPLANT
GLOVE BIOGEL PI IND STRL 7.5 (GLOVE) ×1 IMPLANT
GLOVE BIOGEL PI INDICATOR 6.5 (GLOVE) ×1
GLOVE BIOGEL PI INDICATOR 7.0 (GLOVE) ×2
GLOVE BIOGEL PI INDICATOR 7.5 (GLOVE) ×1
GLOVE SURG SS PI 7.5 STRL IVOR (GLOVE) ×2 IMPLANT
GOWN STRL REUS W/ TWL XL LVL3 (GOWN DISPOSABLE) ×1 IMPLANT
GOWN STRL REUS W/TWL LRG LVL3 (GOWN DISPOSABLE) ×2 IMPLANT
GOWN STRL REUS W/TWL XL LVL3 (GOWN DISPOSABLE) ×1
HEMOSTAT ARISTA ABSORB 3G PWDR (MISCELLANEOUS) ×2 IMPLANT
INST SET LAPROSCOPIC AP (KITS) ×2 IMPLANT
IV NS IRRIG 3000ML ARTHROMATIC (IV SOLUTION) IMPLANT
KIT TURNOVER KIT A (KITS) ×2 IMPLANT
MANIFOLD NEPTUNE II (INSTRUMENTS) ×2 IMPLANT
NEEDLE HYPO 18GX1.5 BLUNT FILL (NEEDLE) ×2 IMPLANT
NEEDLE HYPO 22GX1.5 SAFETY (NEEDLE) ×2 IMPLANT
NEEDLE INSUFFLATION 14GA 120MM (NEEDLE) ×2 IMPLANT
NS IRRIG 1000ML POUR BTL (IV SOLUTION) ×2 IMPLANT
PACK LAP CHOLE LZT030E (CUSTOM PROCEDURE TRAY) ×2 IMPLANT
PAD ARMBOARD 7.5X6 YLW CONV (MISCELLANEOUS) ×2 IMPLANT
PENCIL HANDSWITCHING (ELECTRODE) ×2 IMPLANT
RELOAD 45 VASCULAR/THIN (ENDOMECHANICALS) IMPLANT
RELOAD STAPLE TA45 3.5 REG BLU (ENDOMECHANICALS) IMPLANT
SET BASIN LINEN APH (SET/KITS/TRAYS/PACK) ×2 IMPLANT
SET TUBE IRRIG SUCTION NO TIP (IRRIGATION / IRRIGATOR) IMPLANT
SHEARS HARMONIC ACE PLUS 36CM (ENDOMECHANICALS) ×2 IMPLANT
SPONGE GAUZE 2X2 8PLY STRL LF (GAUZE/BANDAGES/DRESSINGS) ×6 IMPLANT
STAPLER VISISTAT (STAPLE) ×2 IMPLANT
SUT VICRYL 0 UR6 27IN ABS (SUTURE) ×2 IMPLANT
SYR 20CC LL (SYRINGE) ×4 IMPLANT
SYS BAG RETRIEVAL 10MM (BASKET) ×1
SYSTEM BAG RETRIEVAL 10MM (BASKET) ×1 IMPLANT
TRAY FOLEY W/BAG SLVR 16FR (SET/KITS/TRAYS/PACK) ×1
TRAY FOLEY W/BAG SLVR 16FR ST (SET/KITS/TRAYS/PACK) ×1 IMPLANT
TROCAR ENDO BLADELESS 11MM (ENDOMECHANICALS) ×2 IMPLANT
TROCAR ENDO BLADELESS 12MM (ENDOMECHANICALS) ×2 IMPLANT
TROCAR XCEL NON-BLD 5MMX100MML (ENDOMECHANICALS) ×2 IMPLANT
TUBING INSUFFLATION (TUBING) ×2 IMPLANT
WARMER LAPAROSCOPE (MISCELLANEOUS) ×2 IMPLANT
YANKAUER SUCT 12FT TUBE ARGYLE (SUCTIONS) ×2 IMPLANT

## 2018-07-25 NOTE — H&P (Signed)
Tony Campos is an 51 y.o. male.   Chief Complaint: Right lower quadrant abdominal pain HPI: Patient is a 52 year old white male who presented to the emergency room with new onset of right-sided abdominal pain.  He has had a history of colitis being treated off and on with antibiotics over the past few weeks.  He states this pain is different than his previous episodes of abdominal pain.  He just finished a round of antibiotics.  CT scan of the abdomen was performed which revealed acute appendicitis.  There is still inflammatory changes in the descending colon and some in the ascending colon.  Past Medical History:  Diagnosis Date  . Acid reflux   . Anxiety   . Depression   . Diverticulitis   . Genital warts   . Hyperlipidemia   . Hypertension   . Hypothyroidism   . Meningitis   . PAD (peripheral artery disease) ([place])   . SIADH (syndrome of inappropriate ADH production) ([place])   . Sleep apnea 12/2015   uses bipap  . Smoker   . Smoker   . Spondylosis   . Testosterone deficiency     Past Surgical History:  Procedure Laterality Date  . CHOLECYSTECTOMY  2007  . CLOSED REDUCTION MANDIBLE N/A 03/09/2018   Procedure: CLOSED REDUCTION MANDIBULAR MMF;  Surgeon: Jerrell Belfast, MD;  Location: [place];  Service: ENT;  Laterality: N/A;  . KNEE SURGERY     right  . MANDIBULAR HARDWARE REMOVAL N/A 05/06/2018   Procedure: REMOVAL OF MMF HARDWARE;  Surgeon: Jerrell Belfast, MD;  Location: [facility];  Service: ENT;  Laterality: N/A;  . NASAL SEPTUM SURGERY    . NASAL SINUS SURGERY    . right sfa recanalization and stenting      [facility] 12/2015  . WRIST SURGERY     right and left    Family History  Problem Relation Age of Onset  . Cancer Maternal Grandfather        stomach   Social History:  reports that he has been smoking cigarettes. He has been smoking about 1.00 pack per day. He has never used smokeless tobacco. He reports that he does not drink alcohol or use  drugs.  Allergies:  Allergies  Allergen Reactions  . Testosterone     Edema from topical applications     (Not in a hospital admission)  Results for orders placed or performed during the hospital encounter of 07/25/18 (from the past 48 hour(s))  CBC with Differential     Status: Abnormal   Collection Time: 07/25/18  5:28 PM  Result Value Ref Range   WBC 8.0 4.0 - 10.5 K/uL   RBC 4.30 4.22 - 5.81 MIL/uL   Hemoglobin 12.6 (L) 13.0 - 17.0 g/dL   HCT 38.0 (L) 39.0 - 52.0 %   MCV 88.4 78.0 - 100.0 fL   MCH 29.3 26.0 - 34.0 pg   MCHC 33.2 30.0 - 36.0 g/dL   RDW 13.7 11.5 - 15.5 %   Platelets 167 150 - 400 K/uL   Neutrophils Relative % 59 %   Neutro Abs 4.7 1.7 - 7.7 K/uL   Lymphocytes Relative 33 %   Lymphs Abs 2.6 0.7 - 4.0 K/uL   Monocytes Relative 7 %   Monocytes Absolute 0.5 0.1 - 1.0 K/uL   Eosinophils Relative 1 %   Eosinophils Absolute 0.1 0.0 - 0.7 K/uL   Basophils Relative 0 %   Basophils Absolute 0.0 0.0 - 0.1 K/uL  Comment: Performed at [facility], [address] [place], [place] [address]  Basic metabolic panel     Status: Abnormal   Collection Time: 07/25/18  5:28 PM  Result Value Ref Range   Sodium 139 135 - 145 mmol/L   Potassium 2.8 (L) 3.5 - 5.1 mmol/L   Chloride 104 98 - 111 mmol/L   CO2 28 22 - 32 mmol/L   Glucose, Bld 104 (H) 70 - 99 mg/dL   BUN 8 6 - 20 mg/dL   Creatinine, Ser 0.61 0.61 - 1.24 mg/dL   Calcium 9.3 8.9 - 10.3 mg/dL   GFR calc non Af Amer >60 >60 mL/min   GFR calc Af Amer >60 >60 mL/min    Comment: (NOTE) The eGFR has been calculated using the CKD EPI equation. This calculation has not been validated in all clinical situations. eGFR's persistently <60 mL/min signify possible Chronic Kidney Disease.    Anion gap 7 5 - 15    Comment: Performed at [facility], [address], [place],  [address]   Ct Abdomen Pelvis W Contrast  Result Date: 07/25/2018 CLINICAL DATA:  Lower abdominal pain, intermittent diarrhea for 2  months. History of hypertension, cholecystectomy. EXAM: CT ABDOMEN AND PELVIS WITH CONTRAST TECHNIQUE: Multidetector CT imaging of the abdomen and pelvis was performed using the standard protocol following bolus administration of intravenous contrast. CONTRAST:  157m ISOVUE-300 IOPAMIDOL (ISOVUE-300) INJECTION 61% COMPARISON:  None. FINDINGS: Lower chest: No acute abnormality. Hepatobiliary: No focal liver abnormality is seen. Status post cholecystectomy. No biliary dilatation. Pancreas: Unremarkable. No pancreatic ductal dilatation or surrounding inflammatory changes. Spleen: Normal in size without focal abnormality. Adrenals/Urinary Tract: Adrenal glands appear normal. Bilateral renal cysts. No renal stone or hydronephrosis bilaterally. No ureteral or bladder calculi. Stomach/Bowel: Thickening of the walls of the rectosigmoid colon and lower descending colon. Questionable additional thickening of the walls of the RIGHT colon. Walls of the appendix appear thickened in the appendix is distended to 10 mm diameter (series 2, image 61; coronal series 5, image 56). No dilated large or small bowel loops.  Stomach is unremarkable. 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 No acute appearing vascular abnormality. No enlarged lymph nodes seen within the abdomen or pelvis. Reproductive: Prostate is unremarkable. Other: No abscess collection or free intraperitoneal air. Musculoskeletal: No acute or suspicious osseous finding. IMPRESSION: 1. New enlargement of the appendix, measuring 10 mm diameter, with amorphous/thickened walls and subtle periappendiceal inflammation/stranding. Findings are highly suspicious for acute appendicitis. 2. Persistent edematous thickening of the walls of the rectosigmoid colon and lower descending colon, not significantly changed compared to the appearance on earlier study of 05/24/2018, suggesting colitis of infectious or inflammatory  nature. Questionable additional thickening of the walls of the lower RIGHT colon/cecum. Differential considerations would include Crohn's disease and ulcerative colitis. Diverticulitis is considered less likely given the extent of bowel involvement. 3. No abscess collection or free intraperitoneal air. 4. 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 Recommend followup by ultrasound in 3 years. This recommendation follows ACR consensus guidelines: White Paper of the ACR Incidental Findings Committee II on Vascular Findings. J Am Coll Radiol 2013; 167:341-9375.  Aortic Atherosclerosis (ICD10-I70.0). These results were called by telephone at the time of interpretation on 07/25/2018 at 2:50 pm to Dr. WJenna Luo, who verbally acknowledged these results. Electronically Signed   By: SFranki CabotM.D.   On: 07/25/2018 14:54    Review of Systems  Constitutional: Positive for malaise/fatigue.  HENT: Negative.   Eyes: Negative.   Respiratory: Negative.   Cardiovascular: Negative.   Gastrointestinal: Positive for abdominal pain.  Genitourinary: Negative.   Musculoskeletal: Negative.   Skin: Negative.   Neurological: Negative.   Endo/Heme/Allergies: Negative.   Psychiatric/Behavioral: Negative.     Blood pressure 125/73, pulse 86, temperature 98.6 F (37 C), temperature source Oral, resp. rate 17, SpO2 98 %. Physical Exam  Vitals reviewed. Constitutional: He is oriented to person, place, and time. He appears well-developed and well-nourished. No distress.  HENT:  Head: Normocephalic and atraumatic.  Cardiovascular: Normal rate, regular rhythm and normal heart sounds. Exam reveals no gallop and no friction rub.  No murmur heard. Respiratory: Effort normal and breath sounds normal. No respiratory distress. He has no wheezes. He has no rales.  GI: Soft. Bowel sounds are normal. He exhibits no distension. There is tenderness. There is guarding. There is no  rebound.  Tender over McBurney's point.  Neurological: He is alert and oriented to person, place, and time.  Skin: Skin is warm and dry.    CT scan images personally reviewed Assessment/Plan Impression: Acute appendicitis Plan: Patient be taken to the operating room for laparoscopic appendectomy.  The risks and benefits of the procedure including bleeding, infection, and the possibility of an open procedure were fully explained to the patient, who gave informed consent.  Aviva Signs, MD 07/25/2018, 7:14 PM
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
## 2018-07-25 NOTE — Anesthesia Preprocedure Evaluation (Signed)
Anesthesia Evaluation  Patient identified by MRN, date of birth, ID band Patient awake    Reviewed: Allergy & Precautions, NPO status , Patient's Chart, lab work & pertinent test results  Airway Mallampati: II  TM Distance: >3 FB Neck ROM: Full    Dental no notable dental hx. (+) Poor Dentition   Pulmonary neg pulmonary ROS, sleep apnea , Current Smoker,    Pulmonary exam normal breath sounds clear to auscultation       Cardiovascular Exercise Tolerance: Good hypertension, Pt. on medications and Pt. on home beta blockers + Peripheral Vascular Disease  negative cardio ROS Normal cardiovascular examI Rhythm:Regular Rate:Normal     Neuro/Psych  Headaches, Anxiety Depression negative neurological ROS  negative psych ROS   GI/Hepatic negative GI ROS, Neg liver ROS, GERD  Medicated and Controlled,  Endo/Other  negative endocrine ROSHypothyroidism   Renal/GU negative Renal ROS  negative genitourinary   Musculoskeletal negative musculoskeletal ROS (+) Arthritis ,   Abdominal   Peds negative pediatric ROS (+)  Hematology negative hematology ROS (+)   Anesthesia Other Findings   Reproductive/Obstetrics negative OB ROS                             Anesthesia Physical Anesthesia Plan  ASA: II and emergent  Anesthesia Plan: General   Post-op Pain Management:    Induction: Intravenous  PONV Risk Score and Plan:   Airway Management Planned: Oral ETT  Additional Equipment:   Intra-op Plan:   Post-operative Plan: Extubation in OR  Informed Consent: I have reviewed the patients History and Physical, chart, labs and discussed the procedure including the risks, benefits and alternatives for the proposed anesthesia with the patient or authorized representative who has indicated his/her understanding and acceptance.   Dental advisory given  Plan Discussed with: CRNA  Anesthesia Plan  Comments:         Anesthesia Quick Evaluation

## 2018-07-25 NOTE — ED Triage Notes (Signed)
R and LLQ pain x 2 months and dx with diverticulitis. Has been on abx. Pt had ct scan today and dx with appendicitis.  C/o diarrhea but no blood. Last ate last night. Last drink pta, water.

## 2018-07-25 NOTE — Op Note (Signed)
Patient:  Tony Campos  DOB:  07/07/1966  MRN:  [id number]   Preop Diagnosis: Acute appendicitis  Postop Diagnosis: Same  Procedure: Laparoscopic appendectomy  Surgeon: Aviva Signs, MD  Anes: General endotracheal  Indications: Patient is a 52 year old white male who presents with new onset right lower quadrant abdominal pain.  CT scan of the abdomen reveals acute appendicitis.  The risks and benefits of the procedure including bleeding, infection, and the possibility of an open procedure were fully explained to the patient, who gave informed consent.  Procedure note: The patient was placed in the supine position.  After induction of general endotracheal anesthesia, the abdomen was prepped and draped using the usual sterile technique with ChloraPrep.  Surgical site confirmation was performed.  A supraumbilical incision was made down to the fascia.  The Veress needle was introduced into the abdominal cavity and confirmation of placement was done using the saline drop test.  The abdomen was then insufflated to 16 mmHg pressure.  An 11 mm trocar was introduced into the abdominal cavity under direct visualization without difficulty.  The patient was placed in deeper Trendelenburg position and an additional 12 mm trocar was placed in suprapubic region and a 5 mm trocar was placed left lower quadrant region.  The appendix was visualized and noted to be mildly edematous.  There was no evidence of perforation.  It was very elongated.  The mesoappendix was divided using the harmonic scalpel.  The juncture of the appendix to the cecum was fully visualized.  A vascular Endo GIA was placed across this juncture and fired.  The appendix was then removed using an Endo Catch bag.  The staple line was inspected and noted to be within normal limits.  Arista was placed along the right lower quadrant bed where the appendix was attached.  All fluid and air were then evacuated from the abdominal cavity prior to  removal of the trochars.  All wounds were irrigated with normal saline.  All wounds were injected with 0.5% Sensorcaine.  The supraumbilical fascia as well as suprapubic fascia were reapproximated using 0 Vicryl interrupted sutures.  All skin incisions were closed using staples.  Betadine ointment and dry sterile dressings were applied.  All tape and needle counts were correct at the end of the procedure.  The patient was extubated in the operating room and transferred to PACU in stable condition.  Complications: None  EBL: Minimal  Specimen: Appendix

## 2018-07-25 NOTE — Transfer of Care (Signed)
Immediate Anesthesia Transfer of Care Note  Patient: Tony Campos  Procedure(s) Performed: APPENDECTOMY LAPAROSCOPIC (N/A )  Patient Location: PACU  Anesthesia Type:General  Level of Consciousness: awake  Airway & Oxygen Therapy: Patient Spontanous Breathing  Post-op Assessment: Report given to RN  Post vital signs: Reviewed  Last Vitals:  Vitals Value Taken Time  BP    Temp    Pulse 80 07/25/2018  8:43 PM  Resp    SpO2 100 % 07/25/2018  8:43 PM  Vitals shown include unvalidated device data.  Last Pain:  Vitals:   07/25/18 1646  TempSrc: Oral  PainSc: 4          Complications: No apparent anesthesia complications

## 2018-07-26 ENCOUNTER — Encounter (HOSPITAL_COMMUNITY): Payer: Self-pay | Admitting: General Surgery

## 2018-07-26 ENCOUNTER — Other Ambulatory Visit: Payer: Self-pay

## 2018-07-26 LAB — CBC
HCT: 37.1 % — ABNORMAL LOW (ref 39.0–52.0)
HEMOGLOBIN: 12.1 g/dL — AB (ref 13.0–17.0)
MCH: 29.4 pg (ref 26.0–34.0)
MCHC: 32.6 g/dL (ref 30.0–36.0)
MCV: 90 fL (ref 78.0–100.0)
Platelets: 209 10*3/uL (ref 150–400)
RBC: 4.12 MIL/uL — ABNORMAL LOW (ref 4.22–5.81)
RDW: 13.7 % (ref 11.5–15.5)
WBC: 9.8 10*3/uL (ref 4.0–10.5)

## 2018-07-26 LAB — BASIC METABOLIC PANEL
ANION GAP: 9 (ref 5–15)
BUN: 6 mg/dL (ref 6–20)
CALCIUM: 9.4 mg/dL (ref 8.9–10.3)
CHLORIDE: 104 mmol/L (ref 98–111)
CO2: 29 mmol/L (ref 22–32)
Creatinine, Ser: 0.72 mg/dL (ref 0.61–1.24)
GFR calc Af Amer: >60 mL/min (ref >60)
GFR calc non Af Amer: >60 mL/min (ref >60)
Glucose, Bld: 139 mg/dL — ABNORMAL HIGH (ref 70–99)
Potassium: 4.3 mmol/L (ref 3.5–5.1)
Sodium: 142 mmol/L (ref 135–145)

## 2018-07-26 LAB — MAGNESIUM: MAGNESIUM: 1.9 mg/dL (ref 1.7–2.4)

## 2018-07-26 NOTE — Discharge Summary (Signed)
Physician Discharge Summary  Patient ID: Tony Campos MRN: [id number] DOB/AGE: Aug 27, 1966 52 y.o.  Admit date: 07/25/2018 Discharge date: 07/26/2018  Admission Diagnoses: Acute appendicitis, history of diverticulitis, history of chronic pain  Discharge Diagnoses: Same Active Problems:   Acute appendicitis   S/P laparoscopic appendectomy   Discharged Condition: good  Hospital Course: Patient is a 52 year old white male who presented to the emergency room with a 24-hour history of worsening right lower quadrant abdominal pain.  This was different than his previous pain he was having from left-sided diverticulitis.  CT scan of the abdomen revealed acute appendicitis.  The patient underwent laparoscopic appendectomy on 07/25/2018.  He tolerated the surgery well.  His postoperative course has been unremarkable.  His diet was advanced without difficulty.  His preoperative pain has mostly resolved.  The patient is being discharged home on 07/26/2018 in good and improving condition.  Treatments: surgery: Laparoscopic appendectomy on 07/25/2018  Discharge Exam: Blood pressure (!) 141/90, pulse 74, temperature 97.9 F (36.6 C), temperature source Oral, resp. rate 16, height 6' (1.829 m), SpO2 97 %. General appearance: alert, cooperative and no distress Resp: clear to auscultation bilaterally Cardio: regular rate and rhythm, S1, S2 normal, no murmur, click, rub or gallop GI: Soft, dressing is dry and intact.  Disposition: Discharge disposition: 01-Home or Self Care       Discharge Instructions    Diet - low sodium heart healthy   Complete by:  As directed    Increase activity slowly   Complete by:  As directed      Allergies as of 07/26/2018      Reactions   Testosterone    Edema from topical applications      Medication List    TAKE these medications   amoxicillin-clavulanate 875-125 MG tablet Commonly known as:  AUGMENTIN Take 1 tablet by mouth 3 (three) times daily.    Armodafinil 250 MG tablet Take 250 mg by mouth daily.   aspirin 81 MG tablet Take 81 mg by mouth daily.   clonazePAM 1 MG tablet Commonly known as:  KLONOPIN Take 1 mg by mouth 2 (two) times daily.   fentaNYL 50 MCG/HR Commonly known as:  DURAGESIC - dosed mcg/hr Place 50 mcg onto the skin every 3 (three) days.   furosemide 40 MG tablet Commonly known as:  LASIX Take 40mg  BID prn What changed:    how much to take  how to take this  when to take this  reasons to take this  additional instructions   gabapentin 600 MG tablet Commonly known as:  NEURONTIN Take 600 mg by mouth 3 (three) times daily.   LAMICTAL 200 MG tablet Generic drug:  lamoTRIgine Take 200 mg by mouth 2 (two) times daily.   levothyroxine 125 MCG tablet Commonly known as:  SYNTHROID, LEVOTHROID TAKE 1 TABLET EVERY MORNING BEFORE BREAKFAST. What changed:    how much to take  how to take this  when to take this  additional instructions   losartan 50 MG tablet Commonly known as:  COZAAR Take 50 mg by mouth daily.   LYRICA 75 MG capsule Generic drug:  pregabalin Take 75 mg by mouth 2 (two) times daily.   methocarbamol 750 MG tablet Commonly known as:  ROBAXIN Take 750 mg by mouth 3 (three) times daily.   metoprolol tartrate 25 MG tablet Commonly known as:  LOPRESSOR TAKE (1) TABLET TWICE DAILY. What changed:    how much to take  how to take this  when to take this   morphine 15 MG tablet Commonly known as:  MSIR Take 15 mg by mouth 2 (two) times daily.   naloxegol oxalate 25 MG Tabs tablet Commonly known as:  MOVANTIK Take 1 tablet (25 mg total) by mouth daily.   pantoprazole 40 MG tablet Commonly known as:  PROTONIX TAKE (1) TABLET TWICE DAILY. What changed:  See the new instructions.   rosuvastatin 40 MG tablet Commonly known as:  CRESTOR Take 1 tablet (40 mg total) by mouth daily.   TRINTELLIX 20 MG Tabs tablet Generic drug:  vortioxetine HBr Take 20 mg by  mouth daily.   TRINTELLIX 20 MG Tabs tablet Generic drug:  vortioxetine HBr Take 20 mg by mouth daily.   VYVANSE 70 MG capsule Generic drug:  lisdexamfetamine Take 70 mg by mouth daily.      Follow-up Information    Aviva Signs, MD. Schedule an appointment as soon as possible for a visit on 08/01/2018.   Specialty:  General Surgery Contact information: [address] [address] [phone]           Signed: Aviva Signs 07/26/2018, 10:33 AM

## 2018-07-26 NOTE — Discharge Instructions (Signed)
Laparoscopic Appendectomy, Adult, Care After °Refer to this sheet in the next few weeks. These instructions provide you with information about caring for yourself after your procedure. Your health care provider may also give you more specific instructions. Your treatment has been planned according to current medical practices, but problems sometimes occur. Call your health care provider if you have any problems or questions after your procedure. °What can I expect after the procedure? °After the procedure, it is common to have: °· A decrease in your energy level. °· Mild pain in the area where the surgical cuts (incisions) were made. °· Constipation. This can be caused by pain medicine and a decrease in your activity. ° °Follow these instructions at home: °Medicines °· Take over-the-counter and prescription medicines only as told by your health care provider. °· Do not drive for 24 hours if you received a sedative. °· Do not drive or operate heavy machinery while taking prescription pain medicine. °· If you were prescribed an antibiotic medicine, take it as told by your health care provider. Do not stop taking the antibiotic even if you start to feel better. °Activity °· For 3 weeks or as long as told by your health care provider: °? Do not lift anything that is heavier than 10 pounds (4.5 kg). °? Do not play contact sports. °· Gradually return to your normal activities. Ask your health care provider what activities are safe for you. °Bathing °· Keep your incisions clean and dry. Clean them as often as told by your health care provider: °? Gently wash the incisions with soap and water. °? Rinse the incisions with water to remove all soap. °? Pat the incisions dry with a clean towel. Do not rub the incisions. °· You may take showers after 48 hours. °· Do not take baths, swim, or use hot tubs for 2 weeks or as told by your health care provider. °Incision care °· Follow instructions from your healthcare provider about  how to take care of your incisions. Make sure you: °? Wash your hands with soap and water before you change your bandage (dressing). If soap and water are not available, use hand sanitizer. °? Change your dressing as told by your health care provider. °? Leave stitches (sutures), skin glue, or adhesive strips in place. These skin closures may need to stay in place for 2 weeks or longer. If adhesive strip edges start to loosen and curl up, you may trim the loose edges. Do not remove adhesive strips completely unless your health care provider tells you to do that. °· Check your incision areas every day for signs of infection. Check for: °? More redness, swelling, or pain. °? More fluid or blood. °? Warmth. °? Pus or a bad smell. °Other Instructions °· If you were sent home with a drain, follow instructions from your health care provider about how to care for the drain and how to empty it. °· Take deep breaths. This helps to prevent your lungs from becoming inflamed. °· To relieve and prevent constipation: °? Drink plenty of fluids. °? Eat plenty of fruits and vegetables. °· Keep all follow-up visits as told by your health care provider. This is important. °Contact a health care provider if: °· You have more redness, swelling, or pain around an incision. °· You have more fluid or blood coming from an incision. °· Your incision feels warm to the touch. °· You have pus or a bad smell coming from an incision or dressing. °· Your incision   edges break open after your sutures have been removed. °· You have increasing pain in your shoulders. °· You feel dizzy or you faint. °· You develop shortness of breath. °· You keep feeling nauseous or vomiting. °· You have diarrhea or you cannot control your bowel functions. °· You lose your appetite. °· You develop swelling or pain in your legs. °Get help right away if: °· You have a fever. °· You develop a rash. °· You have difficulty breathing. °· You have sharp pains in your  chest. °This information is not intended to replace advice given to you by your health care provider. Make sure you discuss any questions you have with your health care provider. °Document Released: 11/13/2005 Document Revised: 04/14/2016 Document Reviewed: 05/03/2015 °Elsevier Interactive Patient Education © 2018 [employer] ° °

## 2018-07-26 NOTE — Discharge Planning (Signed)
Patient IV removed.  RN assessment and VS revealed stability for DC to home.  Discharge papers given, explained and educated.  Discussed self care and post-op incision care. Informed of suggested FU appts and appts made.  No scripts needed at this time.  Once ready, will be wheeled to front and family transporting home via car.

## 2018-07-26 NOTE — Anesthesia Postprocedure Evaluation (Signed)
Anesthesia Post Note  Patient: DAKIN MADANI  Procedure(s) Performed: APPENDECTOMY LAPAROSCOPIC (N/A )  Patient location during evaluation: Other (d/c'd ) Anesthesia Type: General Level of consciousness: awake Pain management: pain level controlled Vital Signs Assessment: post-procedure vital signs reviewed and stable Respiratory status: spontaneous breathing Cardiovascular status: blood pressure returned to baseline Postop Assessment: no headache Anesthetic complications: no     Last Vitals:  Vitals:   07/26/18 0204 07/26/18 0601  BP: (!) 146/84 (!) 141/90  Pulse: 66 74  Resp:    Temp: (!) 36.3 C 36.6 C  SpO2: 95% 97%    Last Pain:  Vitals:   07/26/18 0601  TempSrc: Oral  PainSc:                  Lenice Llamas

## 2018-08-01 ENCOUNTER — Ambulatory Visit (INDEPENDENT_AMBULATORY_CARE_PROVIDER_SITE_OTHER): Payer: Self-pay | Admitting: General Surgery

## 2018-08-01 ENCOUNTER — Encounter: Payer: Self-pay | Admitting: General Surgery

## 2018-08-01 VITALS — BP 136/71 | HR 91 | Temp 98.0°F | Resp 20 | Wt 178.0 lb

## 2018-08-01 DIAGNOSIS — Z09 Encounter for follow-up examination after completed treatment for conditions other than malignant neoplasm: Secondary | ICD-10-CM

## 2018-08-01 NOTE — Progress Notes (Signed)
Subjective:     Tony Campos  Status post laparoscopic appendectomy.  Doing well.  Still having abilities with his left-sided diverticulitis.  Has been placed back on antibiotic. Objective:    BP 136/71 (BP Location: Left Arm, Patient Position: Sitting, Cuff Size: Normal)   Pulse 91   Temp 98 F (36.7 C) (Temporal)   Resp 20   Wt 178 lb (80.7 kg)   BMI 24.14 kg/m   General:  alert, cooperative and no distress  Abdomen soft, incisions healing well.  Staples removed, Steri-Strips applied.  No tenderness in the right lower quadrant.  No rigidity noted. Final pathology consistent with diagnosis.     Assessment:    Doing well postoperatively.    Plan:   Return to normal activity as able.  We will follow diverticulitis expectantly.  Follow-up here as needed.

## 2018-08-02 ENCOUNTER — Ambulatory Visit (INDEPENDENT_AMBULATORY_CARE_PROVIDER_SITE_OTHER): Payer: Medicare HMO | Admitting: Family Medicine

## 2018-08-02 ENCOUNTER — Encounter: Payer: Self-pay | Admitting: Family Medicine

## 2018-08-02 VITALS — BP 130/70 | HR 90 | Temp 98.6°F | Resp 14 | Ht 72.0 in | Wt 178.0 lb

## 2018-08-02 DIAGNOSIS — K529 Noninfective gastroenteritis and colitis, unspecified: Secondary | ICD-10-CM | POA: Diagnosis not present

## 2018-08-02 DIAGNOSIS — K5732 Diverticulitis of large intestine without perforation or abscess without bleeding: Secondary | ICD-10-CM | POA: Diagnosis not present

## 2018-08-02 MED ORDER — CIPROFLOXACIN HCL 500 MG PO TABS: 500.0000 mg | ORAL_TABLET | Freq: Two times a day (BID) | ORAL | 0 refills | Status: DC

## 2018-08-02 MED ORDER — METRONIDAZOLE 500 MG PO TABS: 500.0000 mg | ORAL_TABLET | Freq: Two times a day (BID) | ORAL | 0 refills | Status: DC

## 2018-08-02 NOTE — Progress Notes (Signed)
Subjective:    Patient ID: Tony Campos, male    DOB: 11-21-1966, 52 y.o.   MRN: [id number]  HPI 06/10/18 Patient was seen by my partner on June 28 for left lower quadrant abdominal pain.  CT scan revealed diverticulitis of the sigmoid colon and rectum.  He is placed on 10 days of Cipro and Flagyl and initially his symptoms improved.  After discontinuing his antibiotics however, the patient has developed recurrent left lower quadrant abdominal pain.  He states the pain is been present for approximately half a week and is gradually getting worse.  However he is also not had a bowel movement during the last 4 days.  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  He discontinued the Movantik prior to his last visit because of diarrhea he was having with the diverticulitis.  He has not had a bowel movement in 4 days which is also likely contributing to his abdominal pain.  At that time, my plan was: Difficult to determine if the patient has recurrent/incompletely treated diverticulitis versus pain due to constipation.  I have extended his antibiotics for an additional 10 days of Cipro 500 mg p.o. twice daily and Flagyl 500 mg p.o. twice daily.  I have asked the patient to resume Movantik immediately and then recheck in 1 week or sooner if worse.  07/23/18 Patient states that while he takes antibiotics, his symptoms improve.  However after discontinuing antibiotics, his symptoms returned.  He again reports severe pain in the left lower quadrant.  Is made worse with food.  He states that he is basically just been eating crackers and drinking water due to the pain that he develops in the left lower quadrant when he eats.  He also reports occasional diarrhea followed by a severe constipation.  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  He states that he is not having a normal bowel movement.  He  denies any melena or hematochezia.  He reports subjective fevers.  At that time, my plan was: Patient is nontoxic-appearing.  I will switch antibiotics to Augmentin 875 mg p.o. 3 times daily for 10 days.  I will obtain a CBC to evaluate for leukocytosis along with a CMP.  I will obtain a CT scan of the abdomen and pelvis to evaluate for any possible perforation or abdominal abscess that would require drainage and surgical consultation.  I have strongly recommended the patient resume Movantik 25 mg p.o. daily for opiate induced constipation in addition to his Amitiza as I feel that constipation is playing a large role in his current abdominal pain  08/02/18 White blood cell count was found to be 21.5.  Therefore CT scan was ordered urgently and the patient was found to have appendicitis.  Results of the CT scan are listed below:  IMPRESSION: 1. New enlargement of the appendix, measuring 10 mm diameter, with amorphous/thickened walls and subtle periappendiceal inflammation/stranding. Findings are highly suspicious for acute appendicitis. 2. Persistent edematous thickening of the walls of the rectosigmoid colon and lower descending colon, not significantly changed compared to the appearance on earlier study of 05/24/2018, suggesting colitis of infectious or inflammatory nature. Questionable additional thickening of the walls of the lower RIGHT colon/cecum. Differential considerations would include Crohn's disease and ulcerative colitis. Diverticulitis is considered less likely given the extent of bowel involvement. 3. No abscess collection or free intraperitoneal air. Patient was referred to the hospital  where he went underwent appendectomy and did well postoperatively.  He was released by general surgery yesterday however he continues to have chronic diarrhea and intermittent left lower quadrant abdominal pain.  He is taken Cipro and Flagyl on 2 separate occasions as well as Augmentin for 10 days  and continues to have sharp left lower quadrant abdominal pain as well as inflammatory colitis on a CT scan.  This raises the question of whether this is inflammatory bowel disease rather than true infectious colitis.  Patient is concerned regarding the next step in care as he continues to have the left lower quadrant abdominal pain, frequent diarrhea, mucus and nausea.  Past Medical History:  Diagnosis Date  . Acid reflux   . Anxiety   . Depression   . Diverticulitis   . Genital warts   . Hyperlipidemia   . Hypertension   . Hypothyroidism   . Meningitis   . PAD (peripheral artery disease) ([place])   . SIADH (syndrome of inappropriate ADH production) ([place])   . Sleep apnea 12/2015   uses bipap  . Smoker   . Smoker   . Spondylosis   . Testosterone deficiency    Past Surgical History:  Procedure Laterality Date  . CHOLECYSTECTOMY  2007  . CLOSED REDUCTION MANDIBLE N/A 03/09/2018   Procedure: CLOSED REDUCTION MANDIBULAR MMF;  Surgeon: Jerrell Belfast, MD;  Location: [facility];  Service: ENT;  Laterality: N/A;  . KNEE SURGERY     right  . LAPAROSCOPIC APPENDECTOMY N/A 07/25/2018   Procedure: APPENDECTOMY LAPAROSCOPIC;  Surgeon: Aviva Signs, MD;  Location: [facility];  Service: General;  Laterality: N/A;  . MANDIBULAR HARDWARE REMOVAL N/A 05/06/2018   Procedure: REMOVAL OF MMF HARDWARE;  Surgeon: Jerrell Belfast, MD;  Location: [facility];  Service: ENT;  Laterality: N/A;  . NASAL SEPTUM SURGERY    . NASAL SINUS SURGERY    . right sfa recanalization and stenting      [facility] 12/2015  . WRIST SURGERY     right and left   Current Outpatient Medications on File Prior to Visit  Medication Sig Dispense Refill  . amoxicillin-clavulanate (AUGMENTIN) 875-125 MG tablet Take 1 tablet by mouth 3 (three) times daily. 30 tablet 0  . Armodafinil 250 MG tablet Take 250 mg by mouth daily.   1  . aspirin 81 MG tablet Take 81 mg by mouth daily.    . clonazePAM (KLONOPIN) 1 MG tablet Take 1 mg  by mouth 2 (two) times daily.     . fentaNYL (DURAGESIC - DOSED MCG/HR) 50 MCG/HR Place 50 mcg onto the skin every 3 (three) days.   0  . furosemide (LASIX) 40 MG tablet Take 40mg  BID prn (Patient taking differently: Take 80 mg by mouth daily as needed for fluid. ) 60 tablet 6  . gabapentin (NEURONTIN) 600 MG tablet Take 600 mg by mouth 3 (three) times daily.    Marland Kitchen lamoTRIgine (LAMICTAL) 200 MG tablet Take 200 mg by mouth 2 (two) times daily.     Marland Kitchen levothyroxine (SYNTHROID, LEVOTHROID) 125 MCG tablet TAKE 1 TABLET EVERY MORNING BEFORE BREAKFAST. (Patient taking differently: Take 125 mcg by mouth daily before breakfast. ) 90 tablet 0  . losartan (COZAAR) 50 MG tablet Take 50 mg by mouth daily.    Marland Kitchen LYRICA 75 MG capsule Take 75 mg by mouth 2 (two) times daily.     . methocarbamol (ROBAXIN) 750 MG tablet Take 750 mg by mouth 3 (three) times daily.     Marland Kitchen  metoprolol tartrate (LOPRESSOR) 25 MG tablet TAKE (1) TABLET TWICE DAILY. (Patient taking differently: Take 25 mg by mouth 2 (two) times daily. TAKE (1) TABLET TWICE DAILY.) 180 tablet 2  . morphine (MSIR) 15 MG tablet Take 15 mg by mouth 2 (two) times daily.     . naloxegol oxalate (MOVANTIK) 25 MG TABS tablet Take 1 tablet (25 mg total) by mouth daily. 30 tablet 0  . pantoprazole (PROTONIX) 40 MG tablet TAKE (1) TABLET TWICE DAILY. (Patient taking differently: Take 40 mg by mouth 2 (two) times daily. ) 60 tablet 5  . rosuvastatin (CRESTOR) 40 MG tablet Take 1 tablet (40 mg total) by mouth daily. 90 tablet 3  . TRINTELLIX 20 MG TABS tablet Take 20 mg by mouth daily.    Marland Kitchen VYVANSE 70 MG capsule Take 70 mg by mouth daily.   0  . [DISCONTINUED] esomeprazole (NEXIUM) 40 MG capsule Take 40 mg by mouth daily before breakfast.       No current facility-administered medications on file prior to visit.    Allergies  Allergen Reactions  . Testosterone     Edema from topical applications   Social History   Socioeconomic History  . Marital status: Married     Spouse name: Not on file  . Number of children: Not on file  . Years of education: Not on file  . Highest education level: Not on file  Occupational History  . Occupation: Training and development officer at [employer]  . Financial resource strain: Not on file  . Food insecurity:    Worry: Not on file    Inability: Not on file  . Transportation needs:    Medical: Not on file    Non-medical: Not on file  Tobacco Use  . Smoking status: Current Every Day Smoker    Packs/day: 1.00    Types: Cigarettes  . Smokeless tobacco: Never Used  Substance and Sexual Activity  . Alcohol use: No  . Drug use: No  . Sexual activity: Not on file  Lifestyle  . Physical activity:    Days per week: Not on file    Minutes per session: Not on file  . Stress: Not on file  Relationships  . Social connections:    Talks on phone: Not on file    Gets together: Not on file    Attends religious service: Not on file    Active member of club or organization: Not on file    Attends meetings of clubs or organizations: Not on file    Relationship status: Not on file  . Intimate partner violence:    Fear of current or ex partner: Not on file    Emotionally abused: Not on file    Physically abused: Not on file    Forced sexual activity: Not on file  Other Topics Concern  . Not on file  Social History Narrative  . Not on file     Review of Systems  All other systems reviewed and are negative.      Objective:   Physical Exam  Constitutional: He appears well-developed and well-nourished. No distress.  Eyes: Conjunctivae are normal.  Cardiovascular: Normal rate, regular rhythm and normal heart sounds.  Pulmonary/Chest: Effort normal. He has no wheezes. He has no rales. He exhibits no tenderness.  Abdominal: Soft. Bowel sounds are normal. He exhibits no distension. There is tenderness. There is no rebound and no guarding.  Musculoskeletal: He exhibits no edema.  Skin:  He is not  diaphoretic.  Vitals reviewed.         Assessment & Plan:  Colitis  Diverticulitis of large intestine without perforation or abscess without bleeding  At this point, I question whether this is chronic infected diverticulitis versus inflammatory bowel disease.  I will extend his antibiotics with Cipro and Flagyl for an additional 10 days however I will consult GI.  If his symptoms are no better after 10 days of additional antibiotics, I believe the patient would benefit from colonoscopy to rule out inflammatory bowel disease.  If colonoscopy confirms chronic infected colitis, I would recommend general surgery consultation for possible colectomy of the disease segment of bowel.  However at the present time, his picture seems to suggest more inflammatory bowel disease

## 2018-08-06 ENCOUNTER — Encounter: Payer: Self-pay | Admitting: Gastroenterology

## 2018-08-06 DIAGNOSIS — M47816 Spondylosis without myelopathy or radiculopathy, lumbar region: Secondary | ICD-10-CM | POA: Diagnosis not present

## 2018-08-06 DIAGNOSIS — G894 Chronic pain syndrome: Secondary | ICD-10-CM | POA: Diagnosis not present

## 2018-08-06 DIAGNOSIS — M47812 Spondylosis without myelopathy or radiculopathy, cervical region: Secondary | ICD-10-CM | POA: Diagnosis not present

## 2018-08-06 DIAGNOSIS — M62830 Muscle spasm of back: Secondary | ICD-10-CM | POA: Diagnosis not present

## 2018-08-09 DIAGNOSIS — R69 Illness, unspecified: Secondary | ICD-10-CM | POA: Diagnosis not present

## 2018-08-09 DIAGNOSIS — G4730 Sleep apnea, unspecified: Secondary | ICD-10-CM | POA: Diagnosis not present

## 2018-08-09 DIAGNOSIS — F4312 Post-traumatic stress disorder, chronic: Secondary | ICD-10-CM | POA: Diagnosis not present

## 2018-08-09 DIAGNOSIS — G47419 Narcolepsy without cataplexy: Secondary | ICD-10-CM | POA: Diagnosis not present

## 2018-08-16 ENCOUNTER — Other Ambulatory Visit: Payer: Self-pay | Admitting: Family Medicine

## 2018-08-29 ENCOUNTER — Other Ambulatory Visit: Payer: Self-pay | Admitting: *Deleted

## 2018-08-29 MED ORDER — NALOXEGOL OXALATE 25 MG PO TABS: 25.0000 mg | ORAL_TABLET | Freq: Every day | ORAL | 3 refills | Status: DC

## 2018-09-03 ENCOUNTER — Other Ambulatory Visit: Payer: Self-pay | Admitting: *Deleted

## 2018-09-03 MED ORDER — NALOXEGOL OXALATE 25 MG PO TABS: 25.0000 mg | ORAL_TABLET | Freq: Every day | ORAL | 3 refills | Status: DC

## 2018-09-05 DIAGNOSIS — M62830 Muscle spasm of back: Secondary | ICD-10-CM | POA: Diagnosis not present

## 2018-09-05 DIAGNOSIS — G894 Chronic pain syndrome: Secondary | ICD-10-CM | POA: Diagnosis not present

## 2018-09-05 DIAGNOSIS — M47812 Spondylosis without myelopathy or radiculopathy, cervical region: Secondary | ICD-10-CM | POA: Diagnosis not present

## 2018-09-05 DIAGNOSIS — M47816 Spondylosis without myelopathy or radiculopathy, lumbar region: Secondary | ICD-10-CM | POA: Diagnosis not present

## 2018-09-13 ENCOUNTER — Ambulatory Visit: Payer: Medicare HMO | Admitting: Gastroenterology

## 2018-09-23 DIAGNOSIS — H5210 Myopia, unspecified eye: Secondary | ICD-10-CM | POA: Diagnosis not present

## 2018-09-24 DIAGNOSIS — Z0100 Encounter for examination of eyes and vision without abnormal findings: Secondary | ICD-10-CM | POA: Diagnosis not present

## 2018-09-30 ENCOUNTER — Other Ambulatory Visit: Payer: Self-pay | Admitting: Family Medicine

## 2018-10-07 DIAGNOSIS — M47812 Spondylosis without myelopathy or radiculopathy, cervical region: Secondary | ICD-10-CM | POA: Diagnosis not present

## 2018-10-07 DIAGNOSIS — M62830 Muscle spasm of back: Secondary | ICD-10-CM | POA: Diagnosis not present

## 2018-10-07 DIAGNOSIS — Z79891 Long term (current) use of opiate analgesic: Secondary | ICD-10-CM | POA: Diagnosis not present

## 2018-10-07 DIAGNOSIS — M47816 Spondylosis without myelopathy or radiculopathy, lumbar region: Secondary | ICD-10-CM | POA: Diagnosis not present

## 2018-10-07 DIAGNOSIS — G894 Chronic pain syndrome: Secondary | ICD-10-CM | POA: Diagnosis not present

## 2018-10-14 ENCOUNTER — Ambulatory Visit: Payer: Medicare HMO | Admitting: Gastroenterology

## 2018-10-14 ENCOUNTER — Telehealth: Payer: Self-pay | Admitting: *Deleted

## 2018-10-14 NOTE — Telephone Encounter (Signed)
LVM informing the patient this RN is cancelling his follow up tomorrow. Advised him that he has not been compliant with CPAP, using only 7/30 days. Advised him his insurance will not continue to cover his machine and supplies cost unless he is compliant with using the machine.  Requested he begin to use his CPAP regularly for his overall general health and wellbeing, and call back to schedule a follow up.  Left office number.

## 2018-10-15 ENCOUNTER — Ambulatory Visit: Payer: Medicare HMO | Admitting: Physician Assistant

## 2018-10-15 ENCOUNTER — Ambulatory Visit: Payer: Medicare HMO | Admitting: Nurse Practitioner

## 2018-10-15 ENCOUNTER — Encounter: Payer: Self-pay | Admitting: *Deleted

## 2018-10-15 ENCOUNTER — Telehealth: Payer: Self-pay

## 2018-10-15 VITALS — BP 160/80 | HR 75 | Ht 72.0 in | Wt 183.0 lb

## 2018-10-15 DIAGNOSIS — R1084 Generalized abdominal pain: Secondary | ICD-10-CM

## 2018-10-15 DIAGNOSIS — R935 Abnormal findings on diagnostic imaging of other abdominal regions, including retroperitoneum: Secondary | ICD-10-CM

## 2018-10-15 DIAGNOSIS — R197 Diarrhea, unspecified: Secondary | ICD-10-CM

## 2018-10-15 MED ORDER — SUPREP BOWEL PREP KIT 17.5-3.13-1.6 GM/177ML PO SOLN: 1.0000 | ORAL | 0 refills | Status: DC

## 2018-10-15 NOTE — Progress Notes (Signed)
Reviewed. I would recommend that the patient have ESR, CRP, and fecal calprotectin given current endoscopy wait times. Thank you.

## 2018-10-15 NOTE — Telephone Encounter (Signed)
Message sent to the pt via My Chart to have labs and stool test. Order in [facility].

## 2018-10-15 NOTE — Telephone Encounter (Signed)
-----   Message from Levin Erp, [place] sent at 10/15/2018  2:06 PM EST ----- Regarding: See Dr. Tarri Glenn addendum Please add labs per Dr. Tarri Glenn. Thanks-JLL  ----- Message ----- From: Thornton Park, MD Sent: 10/15/2018   1:44 PM EST To: Levin Erp, PA    ----- Message ----- From: Levin Erp, [place] Sent: 10/15/2018  12:23 PM EST To: Thornton Park, MD

## 2018-10-15 NOTE — Telephone Encounter (Signed)
Editor: Beavers, Kimberly, MD (Physician)    Reviewed. I would recommend that the patient have ESR, CRP, and fecal calprotectin given current endoscopy wait times. Thank you.     Lab order in Epic Left message on machine to call back  

## 2018-10-15 NOTE — Progress Notes (Signed)
Chief Complaint: Abdominal pain, diarrhea, abnormal CT  HPI:    Tony Campos is a 52 year old male with a past medical history as listed below including chronic back pain for which she is on opioids, who was referred to me by Susy Frizzle, MD for a complaint of generalized abdominal pain, diarrhea and abnormal CT of the abdomen.      05/24/2018 CT the abdomen pelvis with contrast showed sigmoid diverticulitis without evidence for abscess or perforation, inflammatory changes extend to the distal sigmoid and rectum.    06/10/2018 PCP visit.  It was discussed the patient had been given 10 days of Cipro and Flagyl after CT above for diverticulitis.  At that time patient had recurrent left lower quadrant abdominal pain.  His antibiotics were extended for an additional 10 days Cipro 500 twice daily and Flagyl 500 twice daily.    07/23/2018 office visit with PCP, that time continues with abdominal pain.  His antibiotics were switched to Augmentin 875 mg p.o. 3 times daily for 10 days and he had a CT as below.    07/25/2018 CT abdomen pelvis with contrast showed new enlargement of the appendix, measuring 10 mm in diameter with amorphous/thickened walls and subtle periappendiceal inflammation/stranding, persistent edematous thickening of the walls of the rectosigmoid colon and lower descending colon, not significantly changed compared to the appearance on earlier study suggesting colitis of infectious or inflammatory nature.  Also questionable additional thickening of the walls of the lower right colon/cecum differential considerations would include Crohn's disease and ulcerative colitis, no abscess collection or free intraperitoneal air.  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    07/25/2018 patient admitted to the hospital for acute appendicitis, he proceeded to have emergent appendectomy.    08/02/2018 patient seen by PCP.  At that time  continue with chronic diarrhea and intermittent left lower quadrant pain.  His antibiotics were again extended Cipro/ Flagyl for another 10 days and he was sent to our office.    Today, patient tells me that he has episodes of a generalized abdominal pain rated as a 6/10 "and that is even while taking my regular Fentanyl patch and Morphine" that is described as a severe cramping and results in diarrhea of at least 10+ watery/mucousy stools that are "even sometimes just mucus" which can last for weeks at a time, patient has had antibiotics x2 since having his appendix out and also had 2 other episodes of the symptoms which lasted for a week each and went away on its own.  Patient tells me thatantibiotics do not completely get rid of his symptoms but rather "knock the edge off the pain".  Typically about a week or 2 after being on antibiotics symptoms will come right back.  Currently the patient is feeling okay and has done so for the past few weeks.  He has no abdominal pain or diarrhea, in fact he normally runs somewhat constipated due to his opioid medications.  He is on Movantik daily and Amitiza BID and typically has a bowel movement once every 3 to 4 days.  He has no abdominal discomfort in between.    Denies fever, chills, weight loss, anorexia, nausea, vomiting, heartburn, reflux or symptoms that awaken him from sleep.     Past Medical History:  Diagnosis Date  . Acid reflux   . Anxiety   . Aortic atherosclerosis ([place])   . Depression   . Diverticulitis   . Genital  warts   . Hyperlipidemia   . Hypertension   . Hypothyroidism   . Meningitis   . PAD (peripheral artery disease) ([place])   . SIADH (syndrome of inappropriate ADH production) ([place])   . Sleep apnea 12/2015   uses bipap  . Smoker   . Smoker   . Spondylosis   . Testosterone deficiency     Past Surgical History:  Procedure Laterality Date  . CHOLECYSTECTOMY  2007  . CLOSED REDUCTION MANDIBLE N/A 03/09/2018   Procedure: CLOSED  REDUCTION MANDIBULAR MMF;  Surgeon: Jerrell Belfast, MD;  Location: [facility];  Service: ENT;  Laterality: N/A;  . KNEE SURGERY     right  . LAPAROSCOPIC APPENDECTOMY N/A 07/25/2018   Procedure: APPENDECTOMY LAPAROSCOPIC;  Surgeon: Aviva Signs, MD;  Location: [facility];  Service: General;  Laterality: N/A;  . MANDIBULAR HARDWARE REMOVAL N/A 05/06/2018   Procedure: REMOVAL OF MMF HARDWARE;  Surgeon: Jerrell Belfast, MD;  Location: [facility];  Service: ENT;  Laterality: N/A;  . NASAL SEPTUM SURGERY    . NASAL SINUS SURGERY    . right sfa recanalization and stenting      [facility] 12/2015  . WRIST SURGERY     right and left    Current Outpatient Medications  Medication Sig Dispense Refill  . Armodafinil 250 MG tablet Take 250 mg by mouth daily.   1  . aspirin 81 MG tablet Take 81 mg by mouth daily.    . clonazePAM (KLONOPIN) 1 MG tablet Take 1 mg by mouth 2 (two) times daily.     . fentaNYL (DURAGESIC - DOSED MCG/HR) 50 MCG/HR Place 50 mcg onto the skin every 3 (three) days.   0  . furosemide (LASIX) 40 MG tablet TAKE 1 TABLET BY MOUTH TWICE DAILY AS NEEDED. 60 tablet 0  . lamoTRIgine (LAMICTAL) 200 MG tablet Take 200 mg by mouth 2 (two) times daily.     Marland Kitchen levothyroxine (SYNTHROID, LEVOTHROID) 125 MCG tablet TAKE 1 TABLET EVERY MORNING BEFORE BREAKFAST. (Patient taking differently: Take 125 mcg by mouth daily before breakfast. ) 90 tablet 0  . losartan (COZAAR) 50 MG tablet Take 50 mg by mouth daily.    . methocarbamol (ROBAXIN) 750 MG tablet Take 750 mg by mouth 3 (three) times daily.     . metoprolol tartrate (LOPRESSOR) 25 MG tablet TAKE (1) TABLET TWICE DAILY. (Patient taking differently: Take 25 mg by mouth 2 (two) times daily. TAKE (1) TABLET TWICE DAILY.) 180 tablet 2  . morphine (MSIR) 15 MG tablet Take 15 mg by mouth 2 (two) times daily.     . naloxegol oxalate (MOVANTIK) 25 MG TABS tablet Take 1 tablet (25 mg total) by mouth daily. 90 tablet 3  . pantoprazole (PROTONIX) 40  MG tablet TAKE (1) TABLET TWICE DAILY. (Patient taking differently: Take 40 mg by mouth 2 (two) times daily. ) 60 tablet 5  . rosuvastatin (CRESTOR) 40 MG tablet Take 1 tablet (40 mg total) by mouth daily. 90 tablet 3  . TRINTELLIX 20 MG TABS tablet Take 20 mg by mouth daily.    . valACYclovir (VALTREX) 500 MG tablet TAKE (1) TABLET BY MOUTH ONCE DAILY. 30 tablet 0  . VYVANSE 70 MG capsule Take 70 mg by mouth daily.   0  . pregabalin (LYRICA) 150 MG capsule      No current facility-administered medications for this visit.     Allergies as of 10/15/2018 - Review Complete 10/15/2018  Allergen Reaction Noted  .  Testosterone  05/19/2011    Family History  Problem Relation Age of Onset  . Cancer Maternal Grandfather        stomach    Social History   Socioeconomic History  . Marital status: Married    Spouse name: Not on file  . Number of children: Not on file  . Years of education: Not on file  . Highest education level: Not on file  Occupational History  . Occupation: Training and development officer at [employer]  . Financial resource strain: Not on file  . Food insecurity:    Worry: Not on file    Inability: Not on file  . Transportation needs:    Medical: Not on file    Non-medical: Not on file  Tobacco Use  . Smoking status: Current Every Day Smoker    Packs/day: 1.00    Types: Cigarettes  . Smokeless tobacco: Never Used  Substance and Sexual Activity  . Alcohol use: No  . Drug use: No  . Sexual activity: Not on file  Lifestyle  . Physical activity:    Days per week: Not on file    Minutes per session: Not on file  . Stress: Not on file  Relationships  . Social connections:    Talks on phone: Not on file    Gets together: Not on file    Attends religious service: Not on file    Active member of club or organization: Not on file    Attends meetings of clubs or organizations: Not on file    Relationship status: Not on file  . Intimate partner  violence:    Fear of current or ex partner: Not on file    Emotionally abused: Not on file    Physically abused: Not on file    Forced sexual activity: Not on file  Other Topics Concern  . Not on file  Social History Narrative  . Not on file    Review of Systems:    Constitutional: No weight loss, fever or chills Skin: No rash Cardiovascular: No chest pain Respiratory: No SOB  Gastrointestinal: See HPI and otherwise negative Genitourinary: No dysuria  Neurological: No headache, dizziness or syncope Musculoskeletal: No new muscle or joint pain Hematologic: No bleeding  Psychiatric: No history of depression or anxiety   Physical Exam:  Vital signs: BP (!) 160/80   Pulse 75   Ht 6' (1.829 m)   Wt 183 lb (83 kg)   BMI 24.82 kg/m   Constitutional:   Pleasant Caucasian male appears to be in NAD, Well developed, Well nourished, alert and cooperative Head:  Normocephalic and atraumatic. Eyes:   PEERL, EOMI. No icterus. Conjunctiva pink. Ears:  Normal auditory acuity. Neck:  Supple Throat: Oral cavity and pharynx without inflammation, swelling or lesion.  Respiratory: Respirations even and unlabored. Lungs clear to auscultation bilaterally.   No wheezes, crackles, or rhonchi.  Cardiovascular: Normal S1, S2. No MRG. Regular rate and rhythm. No peripheral edema, cyanosis or pallor.  Gastrointestinal:  Soft, nondistended, nontender. No rebound or guarding. Normal bowel sounds. No appreciable masses or hepatomegaly. Rectal:  Not performed.  Msk:  Symmetrical without gross deformities. Without edema, no deformity or joint abnormality.  Neurologic:  Alert and  oriented x4;  grossly normal neurologically.  Skin:   Dry and intact without significant lesions or rashes. Psychiatric: Demonstrates good judgement and reason without abnormal affect or behaviors.  RELEVANT LABS AND IMAGING ( and See HPI): CBC  Component Value Date/Time   WBC 9.8 07/26/2018 0618   RBC 4.12 (L) 07/26/2018  0618   HGB 12.1 (L) 07/26/2018 0618   HCT 37.1 (L) 07/26/2018 0618   PLT 209 07/26/2018 0618   MCV 90.0 07/26/2018 0618   MCH 29.4 07/26/2018 0618   MCHC 32.6 07/26/2018 0618   RDW 13.7 07/26/2018 0618   LYMPHSABS 2.6 07/25/2018 1728   MONOABS 0.5 07/25/2018 1728   EOSABS 0.1 07/25/2018 1728   BASOSABS 0.0 07/25/2018 1728    CMP     Component Value Date/Time   NA 142 07/26/2018 0618   K 4.3 07/26/2018 0618   CL 104 07/26/2018 0618   CO2 29 07/26/2018 0618   GLUCOSE 139 (H) 07/26/2018 0618   BUN 6 07/26/2018 0618   CREATININE 0.72 07/26/2018 0618   CREATININE 0.86 07/23/2018 1241   CALCIUM 9.4 07/26/2018 0618   PROT 5.7 (L) 07/23/2018 1241   ALBUMIN 3.9 03/01/2017 1158   AST 10 07/23/2018 1241   ALT 3 (L) 07/23/2018 1241   ALKPHOS 127 (H) 03/01/2017 1158   BILITOT 0.6 07/23/2018 1241   GFRNONAA >60 07/26/2018 0618   GFRNONAA 102 07/23/2018 1241   GFRAA >60 07/26/2018 0618   GFRAA 118 07/23/2018 1241    Assessment: 1.  Generalized abdominal pain: Episodes of generalized abdominal cramping which resulted in watery diarrhea this has happened at least 8-10 times over the past 5 months or so, history of appendectomy, further episodes after this; concern for IBD 2.  Diarrhea: With above 3.  Abnormal CT of the abdomen: With question of colitis, see HPI, has been treated on multiple occasions with antibiotics but symptoms keep returning, currently doing okay  Plan: 1.  Discussed with patient that there is a high suspicion for IBD given his chronic symptoms.  Scheduled patient for a colonoscopy in the [place] with the next available physician, this was Dr. Tarri Glenn.  Did discuss risk, benefits, limitations and alternatives the patient agrees to proceed. 2.  Patient will stay on Movantik daily as well as Amitiza. 3.  Patient to follow in clinic with Dr. Tarri Glenn per recommendations after time of procedure.  Ellouise Newer, PA-C [facility] 10/15/2018, 10:45 AM  Cc:  Susy Frizzle, MD

## 2018-10-15 NOTE — Patient Instructions (Addendum)
You have been scheduled for a colonoscopy. Please follow written instructions given to you at your visit today.  Please pick up your prep supplies at the pharmacy within the next 1-3 days. If you use inhalers (even only as needed), please bring them with you on the day of your procedure. Your physician has requested that you go to www.startemmi.com and enter the access code given to you at your visit today. This web site gives a general overview about your procedure. However, you should still follow specific instructions given to you by our office regarding your preparation for the procedure.  Please follow up with Dr Tarri Glenn per recommendations at colonoscopy.  If you are age 74 or older, your body mass index should be between 23-30. Your Body mass index is 24.82 kg/m. If this is out of the aforementioned range listed, please consider follow up with your Primary Care Provider.  If you are age 13 or younger, your body mass index should be between 19-25. Your Body mass index is 24.82 kg/m. If this is out of the aformentioned range listed, please consider follow up with your Primary Care Provider.

## 2018-10-18 ENCOUNTER — Other Ambulatory Visit: Payer: Self-pay | Admitting: Family Medicine

## 2018-10-18 MED ORDER — ROSUVASTATIN CALCIUM 40 MG PO TABS: 40.0000 mg | ORAL_TABLET | Freq: Every day | ORAL | 3 refills | Status: DC

## 2018-10-21 ENCOUNTER — Other Ambulatory Visit (INDEPENDENT_AMBULATORY_CARE_PROVIDER_SITE_OTHER): Payer: Medicare HMO

## 2018-10-21 DIAGNOSIS — M47816 Spondylosis without myelopathy or radiculopathy, lumbar region: Secondary | ICD-10-CM | POA: Diagnosis not present

## 2018-10-21 DIAGNOSIS — R197 Diarrhea, unspecified: Secondary | ICD-10-CM

## 2018-10-21 DIAGNOSIS — R1084 Generalized abdominal pain: Secondary | ICD-10-CM

## 2018-10-21 DIAGNOSIS — R935 Abnormal findings on diagnostic imaging of other abdominal regions, including retroperitoneum: Secondary | ICD-10-CM

## 2018-10-21 LAB — HIGH SENSITIVITY CRP: CRP HIGH SENSITIVITY: 7.26 mg/L — AB (ref 0.000–5.000)

## 2018-10-21 LAB — SEDIMENTATION RATE: SED RATE: 6 mm/h (ref 0–20)

## 2018-10-22 ENCOUNTER — Encounter: Payer: Self-pay | Admitting: Gastroenterology

## 2018-10-22 ENCOUNTER — Ambulatory Visit (AMBULATORY_SURGERY_CENTER): Payer: Medicare HMO | Admitting: Gastroenterology

## 2018-10-22 ENCOUNTER — Other Ambulatory Visit: Payer: Medicare HMO

## 2018-10-22 VITALS — BP 152/78 | HR 56 | Temp 98.2°F | Resp 12 | Ht 72.0 in | Wt 183.0 lb

## 2018-10-22 DIAGNOSIS — R1084 Generalized abdominal pain: Secondary | ICD-10-CM | POA: Diagnosis not present

## 2018-10-22 DIAGNOSIS — R935 Abnormal findings on diagnostic imaging of other abdominal regions, including retroperitoneum: Secondary | ICD-10-CM

## 2018-10-22 DIAGNOSIS — R197 Diarrhea, unspecified: Secondary | ICD-10-CM

## 2018-10-22 DIAGNOSIS — D124 Benign neoplasm of descending colon: Secondary | ICD-10-CM | POA: Diagnosis not present

## 2018-10-22 DIAGNOSIS — R109 Unspecified abdominal pain: Secondary | ICD-10-CM | POA: Diagnosis not present

## 2018-10-22 MED ORDER — SODIUM CHLORIDE 0.9 % IV SOLN: 500.0000 mL | Freq: Once | INTRAVENOUS | Status: DC

## 2018-10-22 MED ORDER — FLEET ENEMA 7-19 GM/118ML RE ENEM
1.0000 | ENEMA | Freq: Once | RECTAL | Status: AC
  Administered 2018-10-22: 1 via RECTAL

## 2018-10-22 NOTE — Progress Notes (Signed)
PT taken to PACU. Monitors in place. VSS. Report given to RN. 

## 2018-10-22 NOTE — Progress Notes (Signed)
Patient results  from prep are murky. Patient stating x 3 no solids. Discussed with Dr Tarri Glenn, fleets enema ordered.Patient stating that he would do the Fleets, and that he would not reschedule procedure for additional prep.. Patient refused nurse administration.Instructed patient on administration and not to flush the toilet.

## 2018-10-22 NOTE — Progress Notes (Signed)
Called to room to assist during endoscopic procedure.  Patient ID and intended procedure confirmed with present staff. Received instructions for my participation in the procedure from the performing physician.  

## 2018-10-22 NOTE — Progress Notes (Signed)
Pt has amber results; no solid stool noted per Mart Piggs RN

## 2018-10-22 NOTE — Patient Instructions (Signed)
Handout given : Polyps.  Expect a call from Dr Tarri Glenn office to schedule an office appointment in 2-4 weeks.  YOU HAD AN ENDOSCOPIC PROCEDURE TODAY AT [facility]:   Refer to the procedure report that was given to you for any specific questions about what was found during the examination.  If the procedure report does not answer your questions, please call your gastroenterologist to clarify.  If you requested that your care partner not be given the details of your procedure findings, then the procedure report has been included in a sealed envelope for you to review at your convenience later.  YOU SHOULD EXPECT: Some feelings of bloating in the abdomen. Passage of more gas than usual.  Walking can help get rid of the air that was put into your GI tract during the procedure and reduce the bloating. If you had a lower endoscopy (such as a colonoscopy or flexible sigmoidoscopy) you may notice spotting of blood in your stool or on the toilet paper. If you underwent a bowel prep for your procedure, you may not have a normal bowel movement for a few days.  Please Note:  You might notice some irritation and congestion in your nose or some drainage.  This is from the oxygen used during your procedure.  There is no need for concern and it should clear up in a day or so.  SYMPTOMS TO REPORT IMMEDIATELY:   Following lower endoscopy (colonoscopy or flexible sigmoidoscopy):  Excessive amounts of blood in the stool  Significant tenderness or worsening of abdominal pains  Swelling of the abdomen that is new, acute  Fever of 100F or higher   For urgent or emergent issues, a gastroenterologist can be reached at any hour by calling [phone].   DIET:  We do recommend a small meal at first, but then you may proceed to your regular diet.  Drink plenty of fluids but you should avoid alcoholic beverages for 24 hours.  ACTIVITY:  You should plan to take it easy for the rest of today and you  should NOT DRIVE or use heavy machinery until tomorrow (because of the sedation medicines used during the test).    FOLLOW UP: Our staff will call the number listed on your records the next business day following your procedure to check on you and address any questions or concerns that you may have regarding the information given to you following your procedure. If we do not reach you, we will leave a message.  However, if you are feeling well and you are not experiencing any problems, there is no need to return our call.  We will assume that you have returned to your regular daily activities without incident.  If any biopsies were taken you will be contacted by phone or by letter within the next 1-3 weeks.  Please call us at [phone] if you have not heard about the biopsies in 3 weeks.    SIGNATURES/CONFIDENTIALITY: You and/or your care partner have signed paperwork which will be entered into your electronic medical record.  These signatures attest to the fact that that the information above on your After Visit Summary has been reviewed and is understood.  Full responsibility of the confidentiality of this discharge information lies with you and/or your care-partner.

## 2018-10-22 NOTE — Op Note (Signed)
[facility] Patient Name: Tony Campos Procedure Date: 10/22/2018 11:52 AM MRN: [id number] Endoscopist: Thornton Park MD, MD Age: 52 Referring MD:  Date of Birth: 10/25/1966 Gender: Male Account #: [id number] Procedure:                Colonoscopy Indications:              This is the patient's first colonoscopy, Abnormal                            CT of the GI tract Medicines:                See the Anesthesia note for documentation of the                            administered medications Procedure:                Pre-Anesthesia Assessment:                           - Prior to the procedure, a History and Physical                            was performed, and patient medications and                            allergies were reviewed. The patient's tolerance of                            previous anesthesia was also reviewed. The risks                            and benefits of the procedure and the sedation                            options and risks were discussed with the patient.                            All questions were answered, and informed consent                            was obtained. Prior Anticoagulants: The patient has                            taken no previous anticoagulant or antiplatelet                            agents. ASA Grade Assessment: III - A patient with                            severe systemic disease. After reviewing the risks                            and benefits, the patient was deemed in  satisfactory condition to undergo the procedure.                           After obtaining informed consent, the colonoscope                            was passed under direct vision. Throughout the                            procedure, the patient's blood pressure, pulse, and                            oxygen saturations were monitored continuously. The                            Colonoscope was introduced through  the anus and                            advanced to the the terminal ileum, with                            identification of the appendiceal orifice and IC                            valve. The colonoscopy was performed without                            difficulty. The patient tolerated the procedure                            well. The quality of the bowel preparation was good. Scope In: 11:58:38 AM Scope Out: 12:21:02 PM Scope Withdrawal Time: 0 hours 16 minutes 45 seconds  Total Procedure Duration: 0 hours 22 minutes 24 seconds  Findings:                 The perianal and digital rectal examinations were                            normal.                           A localized area of granular mucosa was found in                            the sigmoid colon from approximately 20-30 cm from                            the anal verge. Biopsies were taken with a cold                            forceps for histology. Biopsies were also taken                            throughout the colon (right, transverse, left, and  rectum) with cold forceps for histology.                           A 3 mm polyp was found in the descending colon. The                            polyp was sessile. The polyp was removed with a                            cold snare. Resection and retrieval were complete.                           A scattered area of mucosa in the terminal ileum                            was mildly erythematous. Biopsies were taken with a                            cold forceps for histology.                           The exam was otherwise without abnormality on                            direct and retroflexion views. Complications:            No immediate complications. Estimated Blood Loss:     Estimated blood loss: none. Impression:               - Granularity in the sigmoid colon of unclear                            clinical significance. Biopsied.                            - One 3 mm polyp in the descending colon, removed                            with a cold snare. Resected and retrieved.                           - Erythematous mucosa in the terminal ileum.                            Biopsied.                           - No diverticulosis seen.                           - The examination was otherwise normal on direct                            and retroflexion views. Recommendation:           - Discharge patient to home.                           -  Resume regular diet today.                           - Continue present medications.                           - Await pathology results.                           - Repeat colonoscopy in 5 years for surveillance.                           - Return to GI office ([facility] or [facility]) in 2-4                            weeks. Thornton Park MD, MD 10/22/2018 12:29:43 PM This report has been signed electronically.

## 2018-10-23 ENCOUNTER — Telehealth: Payer: Self-pay | Admitting: *Deleted

## 2018-10-23 NOTE — Telephone Encounter (Signed)
  Follow up Call-  Call back number 10/22/2018  Post procedure Call Back phone  # [phone]  Permission to leave phone message Yes  Some recent data might be hidden     Patient questions:  Do you have a fever, pain , or abdominal swelling? No. Pain Score  0 *  Have you tolerated food without any problems? Yes.    Have you been able to return to your normal activities? Yes.    Do you have any questions about your discharge instructions: Diet   No. Medications  No. Follow up visit  No.  Do you have questions or concerns about your Care? No.  Actions: * If pain score is 4 or above: No action needed, pain <4.  Pt states he had a "small amount of blood" in stool yesterday.  He had only one incidence but is unsure of how much he passed.. No clots noted.    Denies pain; told to call back if he has any further issues

## 2018-10-24 LAB — CALPROTECTIN, FECAL: Calprotectin, Fecal: 50 ug/g (ref 0–120)

## 2018-10-27 ENCOUNTER — Encounter: Payer: Self-pay | Admitting: Gastroenterology

## 2018-11-05 ENCOUNTER — Other Ambulatory Visit: Payer: Self-pay | Admitting: *Deleted

## 2018-11-05 MED ORDER — LEVOTHYROXINE SODIUM 125 MCG PO TABS: 125.0000 ug | ORAL_TABLET | Freq: Every day | ORAL | 3 refills | Status: DC

## 2018-11-07 ENCOUNTER — Encounter: Payer: Self-pay | Admitting: Gastroenterology

## 2018-11-07 ENCOUNTER — Ambulatory Visit: Payer: Medicare HMO | Admitting: Gastroenterology

## 2018-11-07 VITALS — BP 152/84 | Ht 72.0 in

## 2018-11-07 DIAGNOSIS — R933 Abnormal findings on diagnostic imaging of other parts of digestive tract: Secondary | ICD-10-CM

## 2018-11-07 DIAGNOSIS — R197 Diarrhea, unspecified: Secondary | ICD-10-CM | POA: Diagnosis not present

## 2018-11-07 MED ORDER — METRONIDAZOLE 500 MG PO TABS: 500.0000 mg | ORAL_TABLET | Freq: Three times a day (TID) | ORAL | 0 refills | Status: DC

## 2018-11-07 MED ORDER — CIPROFLOXACIN HCL 500 MG PO TABS: 500.0000 mg | ORAL_TABLET | Freq: Two times a day (BID) | ORAL | 0 refills | Status: DC

## 2018-11-07 MED ORDER — MESALAMINE 800 MG PO TBEC: 800.0000 mg | DELAYED_RELEASE_TABLET | Freq: Three times a day (TID) | ORAL | 0 refills | Status: DC

## 2018-11-07 NOTE — Patient Instructions (Signed)
We have sent the following medications to your pharmacy for you to pick up at your convenience:  Cipro 500 mg twice a day, Metronidazole 500 mg three times a day, mesalamine 800 mg three times a day x 1 month

## 2018-11-07 NOTE — Progress Notes (Signed)
Referring Provider: Susy Frizzle, MD Primary Care Physician:  Susy Frizzle, MD   Chief complaint:  diarrhea   IMPRESSION:  Intermittent diarrhea   - Fecal calprotectin 50.  ESR 6. CRP elevated 7.260. Sigmoid diverticulitis    - initially diagnosed by CT 05/24/18: distal sigmoid and recturm    - given 10 days followed by an additional 10 days of Cipro 500 BID and Flagyly 500 BID    - Augmentin 875 mg TID x 10 days for ongoing pain    - CT 07/25/18: 10 mm appendix, consistent edematous thickening of the rectosigmoid junction       - ? 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 Will repeat Cipro 500 mg BID and metronidazole 500 mg TID for 1 month and add mesalamine 800 mg TID.  Consider broadening the differential after repeating a contrasted CT scan if he does not response to combination therapy and/or new symptoms develop.    PLAN: Cipro 500 mg BID, Metronidazole 500 mg TID, mesalamine 800 mg TID x 1 month Return to clinic in 1 month (on therapy) If fails therapy, repeat CT scan with consider trial of prednisone Surveillance colonoscopy 2024   HPI: Tony Campos is a 52 y.o. male returns in follow-up after his initial consultation and endoscopy.  He was seen by PA Lemmon 10/15/2018 and had a colonoscopy 10/22/2018.    He continues to have episodes of generalized abdominal pain associated watery diarrhea.    Colonoscopy 10/22/2018 showed a 3 mm descending colon polyp.  Granular appearing mucosa from 20 to 30 cm and mild erythema in the distal terminal ileum.Marland Kitchen Biopsies at the time of endoscopy showed normal terminal ileum and normal pancolonic biopsies.  A tubular adenoma was removed from the descending colon. He denies the use of any NSAIDs.   Fecal calprotectin 50.  ESR 6. CRP elevated 7.260.  Symptoms well controlled after colonoscopy until 7 days ago. Developed recurrent diarrhea with 10+ BM daily. Associated lower abdominal cramping. Explosive, watery diarrhea.  Diarrhea has improved since then without any intervention.  Similar to what happened with prior episodes. Some bright red blood on the toilet paper. No prior bleeding.  No other new symptoms. No other associated symptoms. No identified exacerbating or relieving features.   On prior episodes his symptoms improved on antibiotics but recurred when the antibiotics were discontinued.   Past Medical History:  Diagnosis Date  . Acid reflux   . Anxiety   . Aortic atherosclerosis ([place])   . Depression   . Diverticulitis   . Genital warts   . Hyperlipidemia   . Hypertension   . Hypothyroidism   . Meningitis   . PAD (peripheral artery disease) ([place])   . SIADH (syndrome of inappropriate ADH production) ([place])   . Sleep apnea 12/2015   uses bipap  . Smoker   . Smoker   . Spondylosis   . Testosterone deficiency  Past Surgical History:  Procedure Laterality Date  . CHOLECYSTECTOMY  2007  . CLOSED REDUCTION MANDIBLE N/A 03/09/2018   Procedure: CLOSED REDUCTION MANDIBULAR MMF;  Surgeon: Jerrell Belfast, MD;  Location: [facility];  Service: ENT;  Laterality: N/A;  . KNEE SURGERY     right  . LAPAROSCOPIC APPENDECTOMY N/A 07/25/2018   Procedure: APPENDECTOMY LAPAROSCOPIC;  Surgeon: Aviva Signs, MD;  Location: [facility];  Service: General;  Laterality: N/A;  . MANDIBULAR HARDWARE REMOVAL N/A 05/06/2018   Procedure: REMOVAL OF MMF HARDWARE;   Surgeon: Jerrell Belfast, MD;  Location: [facility];  Service: ENT;  Laterality: N/A;  . NASAL SEPTUM SURGERY    . NASAL SINUS SURGERY    . right sfa recanalization and stenting      [facility] 12/2015  . WRIST SURGERY     right and left    Current Outpatient Medications  Medication Sig Dispense Refill  . AMITIZA 24 MCG capsule     . Armodafinil 250 MG tablet Take 250 mg by mouth daily.   1  . aspirin 81 MG tablet Take 81 mg by mouth daily.    Marland Kitchen b complex vitamins tablet Take by mouth.    . calcium-vitamin D (OSCAL WITH D) 500-200 MG-UNIT TABS tablet Take by mouth.    . clonazePAM (KLONOPIN) 1 MG tablet Take 1 mg by mouth 2 (two) times daily.     . fentaNYL (DURAGESIC - DOSED MCG/HR) 50 MCG/HR Place 50 mcg onto the skin every 3 (three) days.   0  . furosemide (LASIX) 40 MG tablet TAKE 1 TABLET BY MOUTH TWICE DAILY AS NEEDED. 60 tablet 0  . glucosamine-chondroitin 500-400 MG tablet Take by mouth.    . lamoTRIgine (LAMICTAL) 200 MG tablet Take 200 mg by mouth 2 (two) times daily.     Marland Kitchen levothyroxine (SYNTHROID, LEVOTHROID) 125 MCG tablet Take 1 tablet (125 mcg total) by mouth daily before breakfast. 90 tablet 3  . losartan (COZAAR) 50 MG tablet Take 50 mg by mouth daily.    . methocarbamol (ROBAXIN) 750 MG tablet Take 750 mg by mouth 3 (three) times daily.     . metoprolol tartrate (LOPRESSOR) 25 MG tablet TAKE (1) TABLET TWICE DAILY. (Patient taking differently: Take 25 mg by mouth 2 (two) times daily. TAKE (1) TABLET TWICE DAILY.) 180 tablet 2  . morphine (MSIR) 15 MG tablet Take 15 mg by mouth 2 (two) times daily.     . naloxegol oxalate (MOVANTIK) 25 MG TABS tablet Take 1 tablet (25 mg total) by mouth daily. 90 tablet 3  . Omega-3 1000 MG CAPS Take by mouth.    . pantoprazole (PROTONIX) 40 MG tablet TAKE (1) TABLET TWICE DAILY. (Patient taking differently: Take 40 mg by mouth 2 (two) times daily. ) 60 tablet 5  . potassium chloride (K-DUR) 10 MEQ tablet Take by mouth.    .  pregabalin (LYRICA) 150 MG capsule     . rosuvastatin (CRESTOR) 40 MG tablet Take 1 tablet (40 mg total) by mouth daily. 90 tablet 3  . TRINTELLIX 20 MG TABS tablet Take 20 mg by mouth daily.    . valACYclovir (VALTREX) 500 MG tablet TAKE (1) TABLET BY MOUTH ONCE DAILY. 30 tablet 0  . VYVANSE 70 MG capsule Take 70 mg by mouth daily.   0  . ciprofloxacin (CIPRO) 500 MG tablet Take 1 tablet (500 mg total) by mouth 2 (two) times daily. 60 tablet 0  . Mesalamine 800 MG TBEC Take  1 tablet (800 mg total) by mouth 3 (three) times daily. 180 tablet 0  . metroNIDAZOLE (FLAGYL) 500 MG tablet Take 1 tablet (500 mg total) by mouth 3 (three) times daily. 90 tablet 0   Current Facility-Administered Medications  Medication Dose Route Frequency Provider Last Rate Last Dose  . 0.9 %  sodium chloride infusion  500 mL Intravenous Once Thornton Park, MD        Allergies as of 11/07/2018 - Review Complete 11/07/2018  Allergen Reaction Noted  . Testosterone  05/19/2011    Family History  Problem Relation Age of Onset  . Cancer Maternal Grandfather        stomach  . Stomach cancer Maternal Grandfather   . Colon cancer Neg Hx   . Colitis Neg Hx   . Esophageal cancer Neg Hx   . Rectal cancer Neg Hx     Social History   Socioeconomic History  . Marital status: Married    Spouse name: Not on file  . Number of children: Not on file  . Years of education: Not on file  . Highest education level: Not on file  Occupational History  . Occupation: Training and development officer at [employer]  . Financial resource strain: Not on file  . Food insecurity:    Worry: Not on file    Inability: Not on file  . Transportation needs:    Medical: Not on file    Non-medical: Not on file  Tobacco Use  . Smoking status: Current Every Day Smoker    Packs/day: 1.00    Types: Cigarettes  . Smokeless tobacco: Never Used  Substance and Sexual Activity  . Alcohol use: No  . Drug use: No  . Sexual  activity: Not on file  Lifestyle  . Physical activity:    Days per week: Not on file    Minutes per session: Not on file  . Stress: Not on file  Relationships  . Social connections:    Talks on phone: Not on file    Gets together: Not on file    Attends religious service: Not on file    Active member of club or organization: Not on file    Attends meetings of clubs or organizations: Not on file    Relationship status: Not on file  . Intimate partner violence:    Fear of current or ex partner: Not on file    Emotionally abused: Not on file    Physically abused: Not on file    Forced sexual activity: Not on file  Other Topics Concern  . Not on file  Social History Narrative  . Not on file    Physical Exam: Vital signs were reviewed. General:   Alert, well-nourished, pleasant and cooperative in NAD Head:  Normocephalic and atraumatic. Eyes:  Sclera clear, no icterus.   Conjunctiva pink. Mouth:  No deformity or lesions.   Neck:  Supple; no thyromegaly. Lungs:  Clear throughout to auscultation.   No wheezes.  Heart:  Regular rate and rhythm; no murmurs Abdomen:  Soft, nontender, normal bowel sounds. No rebound or guarding. No hepatosplenomegaly Rectal:  Deferred  Msk:  Symmetrical without gross deformities. Extremities:  No gross deformities or edema. Neurologic:  Alert and  oriented x4;  grossly nonfocal Skin:  No rash or bruise. Psych:  Alert and cooperative. Normal mood and affect.   Li Bobo L. Tarri Glenn, MD, MPH [facility] 11/07/2018, 12:53 PM

## 2018-11-12 DIAGNOSIS — G894 Chronic pain syndrome: Secondary | ICD-10-CM | POA: Diagnosis not present

## 2018-11-12 DIAGNOSIS — M47812 Spondylosis without myelopathy or radiculopathy, cervical region: Secondary | ICD-10-CM | POA: Diagnosis not present

## 2018-11-12 DIAGNOSIS — M47816 Spondylosis without myelopathy or radiculopathy, lumbar region: Secondary | ICD-10-CM | POA: Diagnosis not present

## 2018-11-12 DIAGNOSIS — M62830 Muscle spasm of back: Secondary | ICD-10-CM | POA: Diagnosis not present

## 2018-12-05 NOTE — Progress Notes (Signed)
Referring Provider: Susy Frizzle, MD Primary Care Physician:  Susy Frizzle, MD   Chief complaint:  diarrhea   IMPRESSION:  Intermittent diarrhea   - Fecal calprotectin 50, ESR 6, CRP elevated 7.260 Sigmoid diverticulitis    - initially diagnosed by CT 05/24/18: distal sigmoid and recturm    - given 10 days followed by an additional 10 days of Cipro 500 BID and Flagyly 500 BID    - Augmentin 875 mg TID x 10 days for ongoing pain    - CT 07/25/18: 10 mm appendix, consistent edematous thickening of the rectosigmoid junction       - ? 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 Seeming less like segmental colitis associated with diverticulosis given recent colonoscopy results and no change in symptoms with 1 month of Cipro 500 mg BID, metronidazole 500 mg TID, and mesalamine 800 mg TID.  ? Post infectious IBS  I am recommending a repeat contrasted CT scan given his lack of response to extended, combination therapy.     PLAN: Complete on month of Cipro 500 mg BID, Metronidazole 500 mg TID, mesalamine 800 mg TID  Repeat ESR, CRP today Stool for Giardia Check TSH Repeat CT scan of the abd/pelvis Trial of colesevelam 1.875g BID x 4 weeks Return to this clinic in 2-3 months   HPI: Tony Campos is a 53 y.o. male returns in follow-up.  He was seen by PA Lemmon 10/15/2018, had a colonoscopy 10/22/2018, and a subsequent office visit with me 11/08/2018.  At that time he had recurrent diarrhea with up to  10 watery explosive bowel movements daily that developed after discontinuing his antibiotics.  He was treated with Cipro 500 mg BID, Metronidazole 500 mg TID, mesalamine 800 mg TID x 1 month.  He returns now in follow-up on this medical regimen. He has 3 days left of therapy.   He continues to have episodes of generalized abdominal pain with associated watery diarrhea. 4-5 BM daily. Fecal soiling that occurs unexpectedly.  Alternating normal bowel habits. Episodes are shorter when on antibiotics.  No blood or mucous. No extra-GI manifestations of IBD. No identified exacerbating or relieving features.   Symptoms on last visit: Symptoms well controlled after colonoscopy until 7 days ago. Developed recurrent diarrhea with 10+ BM daily. Associated lower abdominal cramping. Explosive, watery diarrhea.  Diarrhea has improved since then without any intervention.  Similar to what happened with prior episodes. Some bright red blood on the toilet paper. No prior bleeding.  No other new symptoms. No other associated symptoms. No identified exacerbating or relieving features.     Past Medical History:  Diagnosis Date  . Acid reflux   . Anxiety   . Aortic atherosclerosis ([place])   . Depression   . Diverticulitis   . Genital warts   . Hyperlipidemia   . Hypertension   . Hypothyroidism   . Meningitis   . PAD (peripheral artery disease) ([place])   . SIADH (syndrome  of inappropriate ADH production) ([place])   . Sleep apnea 12/2015   uses bipap  . Smoker   . Smoker   . Spondylosis   . Testosterone deficiency     Past Surgical History:  Procedure Laterality Date  . CHOLECYSTECTOMY  2007  . CLOSED REDUCTION MANDIBLE N/A 03/09/2018   Procedure: CLOSED REDUCTION MANDIBULAR MMF;  Surgeon: Jerrell Belfast, MD;  Location: [facility];  Service: ENT;  Laterality: N/A;  . KNEE SURGERY     right  . LAPAROSCOPIC APPENDECTOMY N/A 07/25/2018   Procedure: APPENDECTOMY LAPAROSCOPIC;  Surgeon: Aviva Signs, MD;  [facility];  Service: General;  Laterality: N/A;  . MANDIBULAR HARDWARE REMOVAL N/A 05/06/2018   Procedure: REMOVAL OF MMF HARDWARE;  Surgeon: Jerrell Belfast, MD;  Location: [facility];  Service: ENT;  Laterality: N/A;  . NASAL SEPTUM SURGERY    . NASAL SINUS SURGERY    . right sfa recanalization and stenting      [facility] 12/2015  . WRIST SURGERY     right and left    Current Outpatient Medications  Medication Sig Dispense Refill  . AMITIZA 24 MCG capsule     . Armodafinil 250 MG tablet Take 250 mg by mouth daily.   1  . b complex vitamins tablet Take by mouth.    . calcium-vitamin D (OSCAL WITH D) 500-200 MG-UNIT TABS tablet Take by mouth.    . clonazePAM (KLONOPIN) 1 MG tablet Take 1 mg by mouth 2 (two) times daily.     . fentaNYL (DURAGESIC - DOSED MCG/HR) 50 MCG/HR Place 50 mcg onto the skin every 3 (three) days.   0  . furosemide (LASIX) 40 MG tablet TAKE 1 TABLET BY MOUTH TWICE DAILY AS NEEDED. (Patient taking differently: as needed. TAKE 1 TABLET BY MOUTH TWICE DAILY AS NEEDED.) 60 tablet 0  . glucosamine-chondroitin 500-400 MG tablet Take by mouth.    . lamoTRIgine (LAMICTAL) 200 MG tablet Take 200 mg by mouth 2 (two) times daily.     Marland Kitchen levothyroxine (SYNTHROID, LEVOTHROID) 125 MCG tablet Take 1 tablet (125 mcg total) by mouth daily before breakfast. 90 tablet 3  . losartan (COZAAR) 50 MG tablet Take 50 mg by mouth daily.    . methocarbamol (ROBAXIN) 750 MG tablet Take 750 mg by mouth 3 (three) times daily.     . metoprolol tartrate (LOPRESSOR) 25 MG tablet TAKE (1) TABLET TWICE DAILY. (Patient taking differently: Take 25 mg by mouth 2 (two) times daily. TAKE (1) TABLET TWICE DAILY.) 180 tablet 2  . morphine (MSIR) 15 MG tablet Take 15 mg by mouth 2 (two) times daily.     . naloxegol oxalate (MOVANTIK) 25 MG TABS tablet Take 1 tablet (25 mg total) by mouth daily. 90 tablet 3  . Omega-3 1000 MG CAPS Take by mouth.    . pantoprazole (PROTONIX) 40 MG tablet TAKE (1) TABLET  TWICE DAILY. (Patient taking differently: Take 40 mg by mouth 2 (two) times daily. ) 60 tablet 5  . potassium chloride (K-DUR) 10 MEQ tablet Take by mouth.    . pregabalin (LYRICA) 150 MG capsule     . rosuvastatin (CRESTOR) 40 MG tablet Take 1 tablet (40 mg total) by mouth daily. 90 tablet 3  . TRINTELLIX 20 MG TABS tablet Take 20 mg by mouth daily.    . valACYclovir (VALTREX) 500 MG tablet TAKE (1) TABLET BY MOUTH ONCE DAILY. 30 tablet 0  . VYVANSE 70 MG capsule Take 70 mg  by mouth daily.   0  . ciprofloxacin (CIPRO) 500 MG tablet Take 1 tablet (500 mg total) by mouth 2 (two) times daily. 60 tablet 0  . colestipol (COLESTID) 1 g tablet Take 1 tablet (1 g total) by mouth 2 (two) times daily. 60 tablet 0  . Mesalamine 800 MG TBEC Take 1 tablet (800 mg total) by mouth 3 (three) times daily. 90 tablet 0  . metroNIDAZOLE (FLAGYL) 500 MG tablet Take 1 tablet (500 mg total) by mouth 3 (three) times daily. 90 tablet 0   Current Facility-Administered Medications  Medication Dose Route Frequency Provider Last Rate Last Dose  . 0.9 %  sodium chloride infusion  500 mL Intravenous Once Thornton Park, MD        Allergies as of 12/06/2018  . (No Active Allergies)    Family History  Problem Relation Age of Onset  . Cancer Maternal Grandfather        stomach  . Stomach cancer Maternal Grandfather   . Colon cancer Neg Hx   . Colitis Neg Hx   . Esophageal cancer Neg Hx   . Rectal cancer Neg Hx     Social History   Socioeconomic History  . Marital status: Married    Spouse name: Not on file  . Number of children: Not on file  . Years of education: Not on file  . Highest education level: Not on file  Occupational History  . Occupation: Training and development officer at [employer]  . Financial resource strain: Not on file  . Food insecurity:    Worry: Not on file    Inability: Not on file  . Transportation needs:    Medical: Not on file    Non-medical: Not on file    Tobacco Use  . Smoking status: Current Every Day Smoker    Packs/day: 1.00    Types: Cigarettes  . Smokeless tobacco: Never Used  Substance and Sexual Activity  . Alcohol use: No  . Drug use: No  . Sexual activity: Not on file  Lifestyle  . Physical activity:    Days per week: Not on file    Minutes per session: Not on file  . Stress: Not on file  Relationships  . Social connections:    Talks on phone: Not on file    Gets together: Not on file    Attends religious service: Not on file    Active member of club or organization: Not on file    Attends meetings of clubs or organizations: Not on file    Relationship status: Not on file  . Intimate partner violence:    Fear of current or ex partner: Not on file    Emotionally abused: Not on file    Physically abused: Not on file    Forced sexual activity: Not on file  Other Topics Concern  . Not on file  Social History Narrative  . Not on file    Physical Exam: Vital signs were reviewed. General:   Alert, well-nourished, pleasant and cooperative in NAD Head:  Normocephalic and atraumatic. Eyes:  Sclera clear, no icterus.   Conjunctiva pink. Mouth:  No deformity or lesions.   Neck:  Supple; no thyromegaly. Lungs:  Clear throughout to auscultation.   No wheezes.  Heart:  Regular rate and rhythm; no murmurs Abdomen:  Soft, nontender, normal bowel sounds. No rebound or guarding. No hepatosplenomegaly Rectal:  Deferred  Msk:  Symmetrical without gross deformities. Extremities:  No gross deformities or edema. Neurologic:  Alert and  oriented x4;  grossly nonfocal Skin:  No rash or bruise. Psych:  Alert and cooperative. Normal mood and affect.   Asbury Hair L. Tarri Glenn, MD, MPH [facility] 12/10/2018, 1:01 PM

## 2018-12-06 ENCOUNTER — Encounter: Payer: Self-pay | Admitting: Gastroenterology

## 2018-12-06 ENCOUNTER — Ambulatory Visit: Payer: Medicare HMO | Admitting: Gastroenterology

## 2018-12-06 ENCOUNTER — Other Ambulatory Visit (INDEPENDENT_AMBULATORY_CARE_PROVIDER_SITE_OTHER): Payer: Medicare HMO

## 2018-12-06 VITALS — BP 118/70 | HR 80 | Ht 70.75 in | Wt 187.0 lb

## 2018-12-06 DIAGNOSIS — R933 Abnormal findings on diagnostic imaging of other parts of digestive tract: Secondary | ICD-10-CM

## 2018-12-06 DIAGNOSIS — R1032 Left lower quadrant pain: Secondary | ICD-10-CM | POA: Diagnosis not present

## 2018-12-06 DIAGNOSIS — R197 Diarrhea, unspecified: Secondary | ICD-10-CM | POA: Diagnosis not present

## 2018-12-06 LAB — COMPREHENSIVE METABOLIC PANEL
ALT: 8 U/L (ref 0–53)
AST: 17 U/L (ref 0–37)
Albumin: 4.2 g/dL (ref 3.5–5.2)
Alkaline Phosphatase: 78 U/L (ref 39–117)
BUN: 20 mg/dL (ref 6–23)
CO2: 27 mEq/L (ref 19–32)
CREATININE: 1.03 mg/dL (ref 0.40–1.50)
Calcium: 9.8 mg/dL (ref 8.4–10.5)
Chloride: 105 mEq/L (ref 96–112)
GFR: 81.16 mL/min (ref >60.00)
Glucose, Bld: 89 mg/dL (ref 70–99)
Potassium: 4.2 mEq/L (ref 3.5–5.1)
Sodium: 141 mEq/L (ref 135–145)
Total Bilirubin: 0.2 mg/dL (ref 0.2–1.2)
Total Protein: 6 g/dL (ref 6.0–8.3)

## 2018-12-06 LAB — C-REACTIVE PROTEIN: CRP: 0.1 mg/dL — ABNORMAL LOW (ref 0.5–20.0)

## 2018-12-06 LAB — SEDIMENTATION RATE: SED RATE: 4 mm/h (ref 0–20)

## 2018-12-06 LAB — TSH: TSH: 3.77 u[IU]/mL (ref 0.35–4.50)

## 2018-12-06 MED ORDER — METRONIDAZOLE 500 MG PO TABS: 500.0000 mg | ORAL_TABLET | Freq: Three times a day (TID) | ORAL | 0 refills | Status: DC

## 2018-12-06 MED ORDER — CIPROFLOXACIN HCL 500 MG PO TABS: 500.0000 mg | ORAL_TABLET | Freq: Two times a day (BID) | ORAL | 0 refills | Status: DC

## 2018-12-06 MED ORDER — MESALAMINE 800 MG PO TBEC: 800.0000 mg | DELAYED_RELEASE_TABLET | Freq: Three times a day (TID) | ORAL | 0 refills | Status: DC

## 2018-12-06 MED ORDER — COLESTIPOL HCL 1 G PO TABS: 1.0000 g | ORAL_TABLET | Freq: Two times a day (BID) | ORAL | 0 refills | Status: DC

## 2018-12-06 NOTE — Patient Instructions (Addendum)
We have sent the following medications to your pharmacy for you to pick up at your convenience:   Cipro 500 mg twice a day   Metronidazole 500 mg three times a day   Mesalamine 800 mg three times a day  Colesevelam 1 gram twice a day  Your provider has requested that you go to the basement level for lab work before leaving today. Press "B" on the elevator. The lab is located at the first door on the left as you exit the elevator.  You have been scheduled for a CT scan of the abdomen and pelvis at [facility] are scheduled on 12-19-2018 at 9:00 am. You should arrive 15 minutes prior to your appointment time for registration. Please follow the written instructions below on the day of your exam:  WARNING: IF YOU ARE ALLERGIC TO IODINE/X-RAY DYE, PLEASE NOTIFY RADIOLOGY IMMEDIATELY AT [phone]! YOU WILL BE GIVEN A 13 HOUR PREMEDICATION PREP.  1) Do not eat or drink anything after 5:00 am (4 hours prior to your test) 2) You have been given 2 bottles of oral contrast to drink. The solution may taste better if refrigerated, but do NOT add ice or any other liquid to this solution. Shake well before drinking.    Drink 1 bottle of contrast @ 7:00 am (2 hours prior to your exam)  Drink 1 bottle of contrast @ 8:00 am (1 hour prior to your exam)  You may take any medications as prescribed with a small amount of water, if necessary. If you take any of the following medications: METFORMIN, GLUCOPHAGE, GLUCOVANCE, AVANDAMET, RIOMET, FORTAMET, [place] MET, JANUMET, GLUMETZA or METAGLIP, you MAY be asked to HOLD this medication 48 hours AFTER the exam.  The purpose of you drinking the oral contrast is to aid in the visualization of your intestinal tract. The contrast solution may cause some diarrhea. Depending on your individual set of symptoms, you may also receive an intravenous injection of x-ray contrast/dye. Plan on being at [facility] for 30 minutes or longer, depending on  the type of exam you are having performed.  This test typically takes 30-45 minutes to complete.  If you have any questions regarding your exam or if you need to reschedule, you may call the CT department at [phone] between the hours of 8:00 am and 5:00 pm, Monday-Friday.  ________________________________________________________________________

## 2018-12-11 ENCOUNTER — Telehealth: Payer: Self-pay | Admitting: Gastroenterology

## 2018-12-11 DIAGNOSIS — M47812 Spondylosis without myelopathy or radiculopathy, cervical region: Secondary | ICD-10-CM | POA: Diagnosis not present

## 2018-12-11 DIAGNOSIS — M62830 Muscle spasm of back: Secondary | ICD-10-CM | POA: Diagnosis not present

## 2018-12-11 DIAGNOSIS — G894 Chronic pain syndrome: Secondary | ICD-10-CM | POA: Diagnosis not present

## 2018-12-11 DIAGNOSIS — M47816 Spondylosis without myelopathy or radiculopathy, lumbar region: Secondary | ICD-10-CM | POA: Diagnosis not present

## 2018-12-11 NOTE — Telephone Encounter (Signed)
Pt is in office and requests to speak to you.  He has questions regarding instructions from his last appt.

## 2018-12-11 NOTE — Telephone Encounter (Signed)
CT is supposed to be obtained after he completes the month of antibiotics. Thank you.

## 2018-12-11 NOTE — Telephone Encounter (Signed)
Pt came in office, he was unsure if CT scan needed to be after he completed the month of antibiotics. He states he is not taking them yet and is awaiting your answer.

## 2018-12-12 NOTE — Telephone Encounter (Signed)
Spoke to patient will cancel CT for now and reschedule when he is done with antibiotics. Instructed him to go ahead and begin antibiotics and call when he is on the last week of treatment so we can schedule his CT.

## 2018-12-18 ENCOUNTER — Other Ambulatory Visit: Payer: Medicare HMO

## 2018-12-18 DIAGNOSIS — R1032 Left lower quadrant pain: Secondary | ICD-10-CM | POA: Diagnosis not present

## 2018-12-19 ENCOUNTER — Ambulatory Visit (HOSPITAL_COMMUNITY): Payer: Medicare HMO

## 2018-12-19 LAB — GIARDIA ANTIGEN
MICRO NUMBER: 89707
RESULT:: NOT DETECTED
SPECIMEN QUALITY: ADEQUATE

## 2018-12-24 ENCOUNTER — Telehealth: Payer: Self-pay | Admitting: Family Medicine

## 2018-12-24 ENCOUNTER — Ambulatory Visit (INDEPENDENT_AMBULATORY_CARE_PROVIDER_SITE_OTHER): Payer: Medicare HMO | Admitting: *Deleted

## 2018-12-24 DIAGNOSIS — Z23 Encounter for immunization: Secondary | ICD-10-CM | POA: Diagnosis not present

## 2018-12-24 NOTE — Telephone Encounter (Signed)
Pt called and states that his TSH is 3.77 and he feels really fatigued and he feels much better when it is lower. Please advise. Can we change his medication?

## 2018-12-26 NOTE — Telephone Encounter (Signed)
I would be glad to see him to discuss.

## 2018-12-26 NOTE — Telephone Encounter (Signed)
Pt aware and apt made 

## 2018-12-27 ENCOUNTER — Other Ambulatory Visit: Payer: Self-pay | Admitting: Physical Medicine and Rehabilitation

## 2018-12-27 ENCOUNTER — Ambulatory Visit (INDEPENDENT_AMBULATORY_CARE_PROVIDER_SITE_OTHER): Payer: Medicare HMO | Admitting: Family Medicine

## 2018-12-27 ENCOUNTER — Encounter: Payer: Self-pay | Admitting: Family Medicine

## 2018-12-27 ENCOUNTER — Other Ambulatory Visit (HOSPITAL_COMMUNITY): Payer: Self-pay | Admitting: Physical Medicine and Rehabilitation

## 2018-12-27 ENCOUNTER — Telehealth: Payer: Self-pay | Admitting: Gastroenterology

## 2018-12-27 VITALS — BP 140/72 | HR 70 | Temp 98.1°F | Resp 18 | Ht 71.0 in | Wt 192.0 lb

## 2018-12-27 DIAGNOSIS — R5382 Chronic fatigue, unspecified: Secondary | ICD-10-CM | POA: Diagnosis not present

## 2018-12-27 DIAGNOSIS — M5416 Radiculopathy, lumbar region: Secondary | ICD-10-CM

## 2018-12-27 MED ORDER — LEVOTHYROXINE SODIUM 125 MCG PO TABS: 125.0000 ug | ORAL_TABLET | Freq: Every day | ORAL | 1 refills | Status: DC

## 2018-12-27 NOTE — Progress Notes (Signed)
Subjective:    Patient ID: Tony Campos, male    DOB: [birth year], 53 y.o.   MRN: [id number]  HPI Wants to increase his levothyroxine.  He is currently on 112 mcg daily.  His TSH was found to be 3.77 recently.  He states that he feels better when it is in the 1-2 range.  When I asked him the symptoms he is having, he reports severe fatigue.  He reports this.  He reports poor libido.  He reports erectile problems.  He does have a history of hypogonadism.  I explained to the patient that I do not feel increasing his thyroxine slightly would help all of the symptoms. Past Medical History:  Diagnosis Date  . Acid reflux   . Anxiety   . Aortic atherosclerosis ([place])   . Depression   . Diverticulitis   . Genital warts   . Hyperlipidemia   . Hypertension   . Hypothyroidism   . Meningitis   . PAD (peripheral artery disease) ([place])   . SIADH (syndrome of inappropriate ADH production) ([place])   . Sleep apnea 12/2015   uses bipap  . Smoker   . Smoker   . Spondylosis   . Testosterone deficiency    Past Surgical History:  Procedure Laterality Date  . CHOLECYSTECTOMY  2007  . CLOSED REDUCTION MANDIBLE N/A 03/09/2018   Procedure: CLOSED REDUCTION MANDIBULAR MMF;  Surgeon: Jerrell Belfast, MD;  Location: [place];  Service: ENT;  Laterality: N/A;  . KNEE SURGERY     right  . LAPAROSCOPIC APPENDECTOMY N/A 07/25/2018   Procedure: APPENDECTOMY LAPAROSCOPIC;  Surgeon: Aviva Signs, MD;  Location: [facility];  Service: General;  Laterality: N/A;  . MANDIBULAR HARDWARE REMOVAL N/A 05/06/2018   Procedure: REMOVAL OF MMF HARDWARE;  Surgeon: Jerrell Belfast, MD;  Location: [place];  Service: ENT;  Laterality: N/A;  . NASAL SEPTUM SURGERY    . NASAL SINUS SURGERY    . right sfa recanalization and stenting      [facility] 12/2015  . WRIST SURGERY     right and left   Current Outpatient Medications on File Prior to Visit  Medication Sig Dispense Refill  . AMITIZA 24 MCG capsule     .  Armodafinil 250 MG tablet Take 250 mg by mouth daily.   1  . b complex vitamins tablet Take by mouth.    . calcium-vitamin D (OSCAL WITH D) 500-200 MG-UNIT TABS tablet Take by mouth.    . ciprofloxacin (CIPRO) 500 MG tablet Take 1 tablet (500 mg total) by mouth 2 (two) times daily. 60 tablet 0  . clonazePAM (KLONOPIN) 1 MG tablet Take 1 mg by mouth 2 (two) times daily.     . colestipol (COLESTID) 1 g tablet Take 1 tablet (1 g total) by mouth 2 (two) times daily. 60 tablet 0  . fentaNYL (DURAGESIC - DOSED MCG/HR) 50 MCG/HR Place 50 mcg onto the skin every 3 (three) days.   0  . furosemide (LASIX) 40 MG tablet TAKE 1 TABLET BY MOUTH TWICE DAILY AS NEEDED. (Patient taking differently: as needed. TAKE 1 TABLET BY MOUTH TWICE DAILY AS NEEDED.) 60 tablet 0  . glucosamine-chondroitin 500-400 MG tablet Take by mouth.    . lamoTRIgine (LAMICTAL) 200 MG tablet Take 200 mg by mouth 2 (two) times daily.     Marland Kitchen levothyroxine (SYNTHROID, LEVOTHROID) 125 MCG tablet Take 1 tablet (125 mcg total) by mouth daily before breakfast. 90 tablet 3  . losartan (COZAAR)  50 MG tablet Take 50 mg by mouth daily.    . Mesalamine 800 MG TBEC Take 1 tablet (800 mg total) by mouth 3 (three) times daily. 90 tablet 0  . methocarbamol (ROBAXIN) 750 MG tablet Take 750 mg by mouth 3 (three) times daily.     . metoprolol tartrate (LOPRESSOR) 25 MG tablet TAKE (1) TABLET TWICE DAILY. (Patient taking differently: Take 25 mg by mouth 2 (two) times daily. TAKE (1) TABLET TWICE DAILY.) 180 tablet 2  . metroNIDAZOLE (FLAGYL) 500 MG tablet Take 1 tablet (500 mg total) by mouth 3 (three) times daily. 90 tablet 0  . morphine (MSIR) 15 MG tablet Take 15 mg by mouth 2 (two) times daily.     . naloxegol oxalate (MOVANTIK) 25 MG TABS tablet Take 1 tablet (25 mg total) by mouth daily. 90 tablet 3  . Omega-3 1000 MG CAPS Take by mouth.    . pantoprazole (PROTONIX) 40 MG tablet TAKE (1) TABLET TWICE DAILY. (Patient taking differently: Take 40 mg by  mouth 2 (two) times daily. ) 60 tablet 5  . potassium chloride (K-DUR) 10 MEQ tablet Take by mouth.    . pregabalin (LYRICA) 150 MG capsule     . rosuvastatin (CRESTOR) 40 MG tablet Take 1 tablet (40 mg total) by mouth daily. 90 tablet 3  . TRINTELLIX 20 MG TABS tablet Take 20 mg by mouth daily.    . valACYclovir (VALTREX) 500 MG tablet TAKE (1) TABLET BY MOUTH ONCE DAILY. 30 tablet 0  . VYVANSE 70 MG capsule Take 70 mg by mouth daily.   0  . [DISCONTINUED] esomeprazole (NEXIUM) 40 MG capsule Take 40 mg by mouth daily before breakfast.       Current Facility-Administered Medications on File Prior to Visit  Medication Dose Route Frequency Provider Last Rate Last Dose  . 0.9 %  sodium chloride infusion  500 mL Intravenous Once Thornton Park, MD       No Active Allergies Social History   Socioeconomic History  . Marital status: Married    Spouse name: Not on file  . Number of children: Not on file  . Years of education: Not on file  . Highest education level: Not on file  Occupational History  . Occupation: Training and development officer at [employer]  . Financial resource strain: Not on file  . Food insecurity:    Worry: Not on file    Inability: Not on file  . Transportation needs:    Medical: Not on file    Non-medical: Not on file  Tobacco Use  . Smoking status: Current Every Day Smoker    Packs/day: 1.00    Types: Cigarettes  . Smokeless tobacco: Never Used  Substance and Sexual Activity  . Alcohol use: No  . Drug use: No  . Sexual activity: Not on file  Lifestyle  . Physical activity:    Days per week: Not on file    Minutes per session: Not on file  . Stress: Not on file  Relationships  . Social connections:    Talks on phone: Not on file    Gets together: Not on file    Attends religious service: Not on file    Active member of club or organization: Not on file    Attends meetings of clubs or organizations: Not on file    Relationship status:  Not on file  . Intimate partner violence:    Fear of current or ex  partner: Not on file    Emotionally abused: Not on file    Physically abused: Not on file    Forced sexual activity: Not on file  Other Topics Concern  . Not on file  Social History Narrative  . Not on file     Review of Systems  All other systems reviewed and are negative.      Objective:   Physical Exam  Constitutional: He appears well-developed and well-nourished. No distress.  Eyes: Conjunctivae are normal.  Cardiovascular: Normal rate, regular rhythm and normal heart sounds.  Pulmonary/Chest: Effort normal. He has no wheezes. He has no rales. He exhibits no tenderness.  Abdominal: Soft. Bowel sounds are normal.  Skin: He is not diaphoretic.  Vitals reviewed.         Assessment & Plan:  Chronic fatigue.  I suspect more of his chronic fatigue is due to hypogonadism rather than due to his hypothyroidism.  However I will increase his levothyroxine to 125 mcg a day and recheck a TSH in 6 weeks.  If his TSH is in the 1-2 range at that time and he continues to report fatigue, I would recommend checking his free and total testosterone level and if low consider replacement.

## 2018-12-27 NOTE — Telephone Encounter (Signed)
Patient notified that we did speak earlier in the week. All questions answered

## 2018-12-27 NOTE — Telephone Encounter (Signed)
Pt called asking to speak with you. He stated that he had a vm from you on Monday and he did not remember if he called you back.

## 2018-12-28 HISTORY — PX: APPENDECTOMY: SHX54

## 2019-01-02 ENCOUNTER — Other Ambulatory Visit: Payer: Self-pay | Admitting: *Deleted

## 2019-01-02 MED ORDER — VALACYCLOVIR HCL 500 MG PO TABS: ORAL_TABLET | ORAL | 0 refills | Status: DC

## 2019-01-06 ENCOUNTER — Ambulatory Visit (HOSPITAL_COMMUNITY)
Admission: RE | Admit: 2019-01-06 | Discharge: 2019-01-06 | Disposition: A | Discharge status: Home / Self Care | Payer: Medicare HMO | Source: Ambulatory Visit | Attending: Physical Medicine and Rehabilitation | Admitting: Physical Medicine and Rehabilitation

## 2019-01-06 DIAGNOSIS — M5416 Radiculopathy, lumbar region: Secondary | ICD-10-CM

## 2019-01-06 DIAGNOSIS — M545 Low back pain: Secondary | ICD-10-CM | POA: Diagnosis not present

## 2019-01-07 ENCOUNTER — Encounter: Payer: Self-pay | Admitting: Family Medicine

## 2019-01-07 DIAGNOSIS — I714 Abdominal aortic aneurysm, without rupture, unspecified: Secondary | ICD-10-CM | POA: Insufficient documentation

## 2019-01-09 DIAGNOSIS — G894 Chronic pain syndrome: Secondary | ICD-10-CM | POA: Diagnosis not present

## 2019-01-09 DIAGNOSIS — M47816 Spondylosis without myelopathy or radiculopathy, lumbar region: Secondary | ICD-10-CM | POA: Diagnosis not present

## 2019-01-09 DIAGNOSIS — M791 Myalgia, unspecified site: Secondary | ICD-10-CM | POA: Diagnosis not present

## 2019-01-09 DIAGNOSIS — M47812 Spondylosis without myelopathy or radiculopathy, cervical region: Secondary | ICD-10-CM | POA: Diagnosis not present

## 2019-01-09 DIAGNOSIS — M62830 Muscle spasm of back: Secondary | ICD-10-CM | POA: Diagnosis not present

## 2019-01-13 ENCOUNTER — Other Ambulatory Visit: Payer: Self-pay | Admitting: Family Medicine

## 2019-01-13 ENCOUNTER — Telehealth: Payer: Self-pay | Admitting: Gastroenterology

## 2019-01-14 NOTE — Telephone Encounter (Signed)
Patient notified of CT at [facility] on 01/27/19 4:00.  He is asked to arrive at 3:45 and be NPO after 12:00.  He has the CT contrast at home.  He is instructed to drink one bottle at 2:00 the second at 3:00

## 2019-01-16 ENCOUNTER — Other Ambulatory Visit: Payer: Self-pay | Admitting: Emergency Medicine

## 2019-01-16 DIAGNOSIS — M5417 Radiculopathy, lumbosacral region: Secondary | ICD-10-CM | POA: Diagnosis not present

## 2019-01-16 MED ORDER — COLESTIPOL HCL 1 G PO TABS: 1.0000 g | ORAL_TABLET | Freq: Two times a day (BID) | ORAL | 0 refills | Status: DC

## 2019-01-27 ENCOUNTER — Ambulatory Visit (HOSPITAL_COMMUNITY): Admission: RE | Admit: 2019-01-27 | Payer: Medicare HMO | Source: Ambulatory Visit

## 2019-02-03 DIAGNOSIS — G894 Chronic pain syndrome: Secondary | ICD-10-CM | POA: Diagnosis not present

## 2019-02-03 DIAGNOSIS — M47816 Spondylosis without myelopathy or radiculopathy, lumbar region: Secondary | ICD-10-CM | POA: Diagnosis not present

## 2019-02-03 DIAGNOSIS — M47812 Spondylosis without myelopathy or radiculopathy, cervical region: Secondary | ICD-10-CM | POA: Diagnosis not present

## 2019-02-03 DIAGNOSIS — M62830 Muscle spasm of back: Secondary | ICD-10-CM | POA: Diagnosis not present

## 2019-02-07 DIAGNOSIS — Z95820 Peripheral vascular angioplasty status with implants and grafts: Secondary | ICD-10-CM | POA: Diagnosis not present

## 2019-02-07 DIAGNOSIS — I714 Abdominal aortic aneurysm, without rupture: Secondary | ICD-10-CM | POA: Diagnosis not present

## 2019-02-07 DIAGNOSIS — I739 Peripheral vascular disease, unspecified: Secondary | ICD-10-CM | POA: Diagnosis not present

## 2019-02-07 DIAGNOSIS — R69 Illness, unspecified: Secondary | ICD-10-CM | POA: Diagnosis not present

## 2019-02-08 ENCOUNTER — Other Ambulatory Visit: Payer: Self-pay | Admitting: Family Medicine

## 2019-02-10 ENCOUNTER — Other Ambulatory Visit: Payer: Medicare HMO

## 2019-02-10 ENCOUNTER — Other Ambulatory Visit: Payer: Self-pay | Admitting: Family Medicine

## 2019-02-10 ENCOUNTER — Other Ambulatory Visit: Payer: Self-pay

## 2019-02-10 DIAGNOSIS — R5382 Chronic fatigue, unspecified: Secondary | ICD-10-CM | POA: Diagnosis not present

## 2019-02-11 LAB — TSH: TSH: 1.92 m[IU]/L (ref 0.40–4.50)

## 2019-02-13 ENCOUNTER — Other Ambulatory Visit: Payer: Self-pay

## 2019-02-13 ENCOUNTER — Ambulatory Visit (HOSPITAL_COMMUNITY)
Admission: RE | Admit: 2019-02-13 | Discharge: 2019-02-13 | Disposition: A | Discharge status: Home / Self Care | Payer: Medicare HMO | Source: Ambulatory Visit | Attending: Gastroenterology | Admitting: Gastroenterology

## 2019-02-13 DIAGNOSIS — R1032 Left lower quadrant pain: Secondary | ICD-10-CM | POA: Insufficient documentation

## 2019-02-13 DIAGNOSIS — R197 Diarrhea, unspecified: Secondary | ICD-10-CM | POA: Diagnosis not present

## 2019-02-13 DIAGNOSIS — R109 Unspecified abdominal pain: Secondary | ICD-10-CM | POA: Diagnosis not present

## 2019-02-13 LAB — POCT I-STAT CREATININE: Creatinine, Ser: 0.9 mg/dL (ref 0.61–1.24)

## 2019-02-13 MED ORDER — IOHEXOL 300 MG/ML  SOLN
100.0000 mL | Freq: Once | INTRAMUSCULAR | Status: AC | PRN
  Administered 2019-02-13: 100 mL via INTRAVENOUS

## 2019-02-20 DIAGNOSIS — M5417 Radiculopathy, lumbosacral region: Secondary | ICD-10-CM | POA: Diagnosis not present

## 2019-02-22 ENCOUNTER — Other Ambulatory Visit: Payer: Self-pay | Admitting: Family Medicine

## 2019-02-22 DIAGNOSIS — R000 Tachycardia, unspecified: Secondary | ICD-10-CM

## 2019-03-04 DIAGNOSIS — M47812 Spondylosis without myelopathy or radiculopathy, cervical region: Secondary | ICD-10-CM | POA: Diagnosis not present

## 2019-03-04 DIAGNOSIS — Z79891 Long term (current) use of opiate analgesic: Secondary | ICD-10-CM | POA: Diagnosis not present

## 2019-03-04 DIAGNOSIS — M62830 Muscle spasm of back: Secondary | ICD-10-CM | POA: Diagnosis not present

## 2019-03-04 DIAGNOSIS — M47816 Spondylosis without myelopathy or radiculopathy, lumbar region: Secondary | ICD-10-CM | POA: Diagnosis not present

## 2019-03-04 DIAGNOSIS — G894 Chronic pain syndrome: Secondary | ICD-10-CM | POA: Diagnosis not present

## 2019-03-18 ENCOUNTER — Other Ambulatory Visit: Payer: Self-pay | Admitting: Family Medicine

## 2019-04-01 DIAGNOSIS — M47812 Spondylosis without myelopathy or radiculopathy, cervical region: Secondary | ICD-10-CM | POA: Diagnosis not present

## 2019-04-01 DIAGNOSIS — M47816 Spondylosis without myelopathy or radiculopathy, lumbar region: Secondary | ICD-10-CM | POA: Diagnosis not present

## 2019-04-01 DIAGNOSIS — G894 Chronic pain syndrome: Secondary | ICD-10-CM | POA: Diagnosis not present

## 2019-04-01 DIAGNOSIS — M62830 Muscle spasm of back: Secondary | ICD-10-CM | POA: Diagnosis not present

## 2019-04-04 DIAGNOSIS — G47419 Narcolepsy without cataplexy: Secondary | ICD-10-CM | POA: Diagnosis not present

## 2019-04-04 DIAGNOSIS — G4730 Sleep apnea, unspecified: Secondary | ICD-10-CM | POA: Diagnosis not present

## 2019-04-04 DIAGNOSIS — R69 Illness, unspecified: Secondary | ICD-10-CM | POA: Diagnosis not present

## 2019-04-04 DIAGNOSIS — F4312 Post-traumatic stress disorder, chronic: Secondary | ICD-10-CM | POA: Diagnosis not present

## 2019-04-07 DIAGNOSIS — G629 Polyneuropathy, unspecified: Secondary | ICD-10-CM | POA: Diagnosis not present

## 2019-04-07 DIAGNOSIS — G47419 Narcolepsy without cataplexy: Secondary | ICD-10-CM | POA: Diagnosis not present

## 2019-04-07 DIAGNOSIS — B009 Herpesviral infection, unspecified: Secondary | ICD-10-CM | POA: Diagnosis not present

## 2019-04-07 DIAGNOSIS — I739 Peripheral vascular disease, unspecified: Secondary | ICD-10-CM | POA: Diagnosis not present

## 2019-04-07 DIAGNOSIS — E785 Hyperlipidemia, unspecified: Secondary | ICD-10-CM | POA: Diagnosis not present

## 2019-04-07 DIAGNOSIS — R69 Illness, unspecified: Secondary | ICD-10-CM | POA: Diagnosis not present

## 2019-04-07 DIAGNOSIS — G8929 Other chronic pain: Secondary | ICD-10-CM | POA: Diagnosis not present

## 2019-04-07 DIAGNOSIS — E039 Hypothyroidism, unspecified: Secondary | ICD-10-CM | POA: Diagnosis not present

## 2019-04-22 DIAGNOSIS — M5416 Radiculopathy, lumbar region: Secondary | ICD-10-CM | POA: Diagnosis not present

## 2019-04-25 ENCOUNTER — Other Ambulatory Visit: Payer: Self-pay | Admitting: Family Medicine

## 2019-04-25 MED ORDER — VALACYCLOVIR HCL 500 MG PO TABS: ORAL_TABLET | ORAL | 3 refills | Status: DC

## 2019-05-08 DIAGNOSIS — I10 Essential (primary) hypertension: Secondary | ICD-10-CM | POA: Diagnosis not present

## 2019-05-08 DIAGNOSIS — M5126 Other intervertebral disc displacement, lumbar region: Secondary | ICD-10-CM | POA: Diagnosis not present

## 2019-05-08 DIAGNOSIS — M5416 Radiculopathy, lumbar region: Secondary | ICD-10-CM | POA: Diagnosis not present

## 2019-05-13 DIAGNOSIS — G894 Chronic pain syndrome: Secondary | ICD-10-CM | POA: Diagnosis not present

## 2019-05-13 DIAGNOSIS — Z1159 Encounter for screening for other viral diseases: Secondary | ICD-10-CM | POA: Diagnosis not present

## 2019-05-13 DIAGNOSIS — M47816 Spondylosis without myelopathy or radiculopathy, lumbar region: Secondary | ICD-10-CM | POA: Diagnosis not present

## 2019-05-13 DIAGNOSIS — M62830 Muscle spasm of back: Secondary | ICD-10-CM | POA: Diagnosis not present

## 2019-05-13 DIAGNOSIS — M47812 Spondylosis without myelopathy or radiculopathy, cervical region: Secondary | ICD-10-CM | POA: Diagnosis not present

## 2019-05-18 ENCOUNTER — Other Ambulatory Visit: Payer: Self-pay | Admitting: Family Medicine

## 2019-06-06 DIAGNOSIS — Z1159 Encounter for screening for other viral diseases: Secondary | ICD-10-CM | POA: Diagnosis not present

## 2019-06-10 DIAGNOSIS — G894 Chronic pain syndrome: Secondary | ICD-10-CM | POA: Diagnosis not present

## 2019-06-10 DIAGNOSIS — M47816 Spondylosis without myelopathy or radiculopathy, lumbar region: Secondary | ICD-10-CM | POA: Diagnosis not present

## 2019-06-10 DIAGNOSIS — M47812 Spondylosis without myelopathy or radiculopathy, cervical region: Secondary | ICD-10-CM | POA: Diagnosis not present

## 2019-06-10 DIAGNOSIS — M62830 Muscle spasm of back: Secondary | ICD-10-CM | POA: Diagnosis not present

## 2019-06-12 DIAGNOSIS — G894 Chronic pain syndrome: Secondary | ICD-10-CM | POA: Diagnosis not present

## 2019-06-12 DIAGNOSIS — M5126 Other intervertebral disc displacement, lumbar region: Secondary | ICD-10-CM | POA: Diagnosis not present

## 2019-06-12 DIAGNOSIS — M5116 Intervertebral disc disorders with radiculopathy, lumbar region: Secondary | ICD-10-CM | POA: Diagnosis not present

## 2019-07-08 DIAGNOSIS — F4312 Post-traumatic stress disorder, chronic: Secondary | ICD-10-CM | POA: Diagnosis not present

## 2019-07-08 DIAGNOSIS — R69 Illness, unspecified: Secondary | ICD-10-CM | POA: Diagnosis not present

## 2019-07-08 DIAGNOSIS — G894 Chronic pain syndrome: Secondary | ICD-10-CM | POA: Diagnosis not present

## 2019-07-08 DIAGNOSIS — M62830 Muscle spasm of back: Secondary | ICD-10-CM | POA: Diagnosis not present

## 2019-07-08 DIAGNOSIS — G4730 Sleep apnea, unspecified: Secondary | ICD-10-CM | POA: Diagnosis not present

## 2019-07-08 DIAGNOSIS — M47812 Spondylosis without myelopathy or radiculopathy, cervical region: Secondary | ICD-10-CM | POA: Diagnosis not present

## 2019-07-08 DIAGNOSIS — M47816 Spondylosis without myelopathy or radiculopathy, lumbar region: Secondary | ICD-10-CM | POA: Diagnosis not present

## 2019-07-08 DIAGNOSIS — G47419 Narcolepsy without cataplexy: Secondary | ICD-10-CM | POA: Diagnosis not present

## 2019-07-16 ENCOUNTER — Other Ambulatory Visit: Payer: Self-pay | Admitting: Family Medicine

## 2019-07-24 ENCOUNTER — Other Ambulatory Visit: Payer: Self-pay | Admitting: Emergency Medicine

## 2019-07-24 MED ORDER — COLESTIPOL HCL 1 G PO TABS: 1.0000 g | ORAL_TABLET | Freq: Two times a day (BID) | ORAL | 0 refills | Status: DC

## 2019-07-24 NOTE — Telephone Encounter (Signed)
Refill request received via fax and refill sent to pharmacy.

## 2019-08-07 DIAGNOSIS — G894 Chronic pain syndrome: Secondary | ICD-10-CM | POA: Diagnosis not present

## 2019-08-07 DIAGNOSIS — M62830 Muscle spasm of back: Secondary | ICD-10-CM | POA: Diagnosis not present

## 2019-08-07 DIAGNOSIS — M47812 Spondylosis without myelopathy or radiculopathy, cervical region: Secondary | ICD-10-CM | POA: Diagnosis not present

## 2019-08-07 DIAGNOSIS — Z79891 Long term (current) use of opiate analgesic: Secondary | ICD-10-CM | POA: Diagnosis not present

## 2019-08-07 DIAGNOSIS — M47816 Spondylosis without myelopathy or radiculopathy, lumbar region: Secondary | ICD-10-CM | POA: Diagnosis not present

## 2019-08-17 IMAGING — MR MR LUMBAR SPINE W/O CM
4 of 5 series · 14 of 48 positions shown · non-contrast
Comparison: Prior MRI from 04/16/2015.

CLINICAL DATA: Initial evaluation for low back pain radiating to
the left lower extremity for 5 months.

EXAM:
MRI LUMBAR SPINE WITHOUT CONTRAST
TECHNIQUE: Multiplanar, multisequence MR imaging of the lumbar spine was
performed. No intravenous contrast was administered.

[Series 3: T2 · sagittal · 4.0mm · 0.73mm/px · 5 of 15 slices shown (1 of 2)]
[im 1/15]
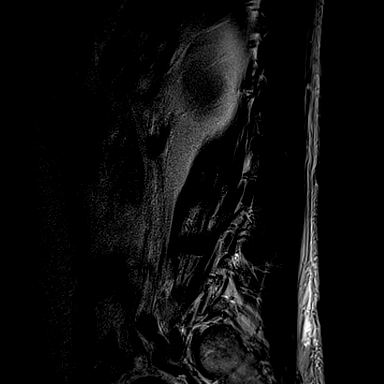
[im 3/15]
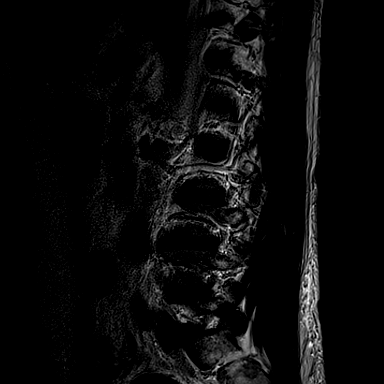
[im 6/15]
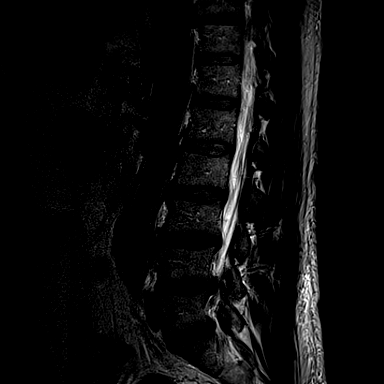
[im 9/15]
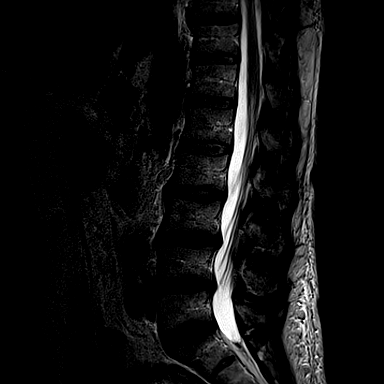
[im 15/15]
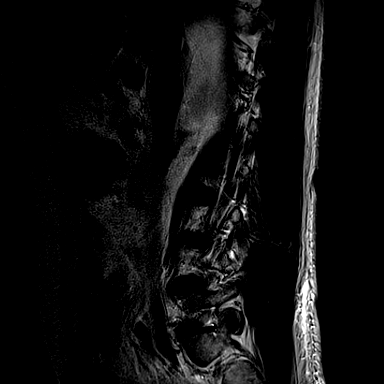

[Series 4: T1 · sagittal · 4.0mm · 0.36mm/px · 3 of 15 slices shown (1 of 2)]
[im 3/15]
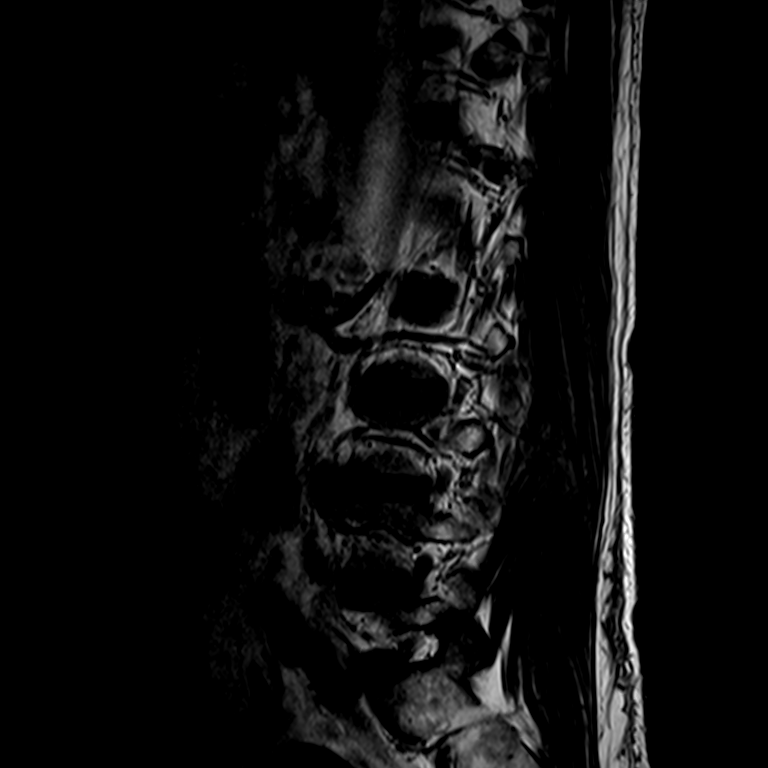
[im 9/15]
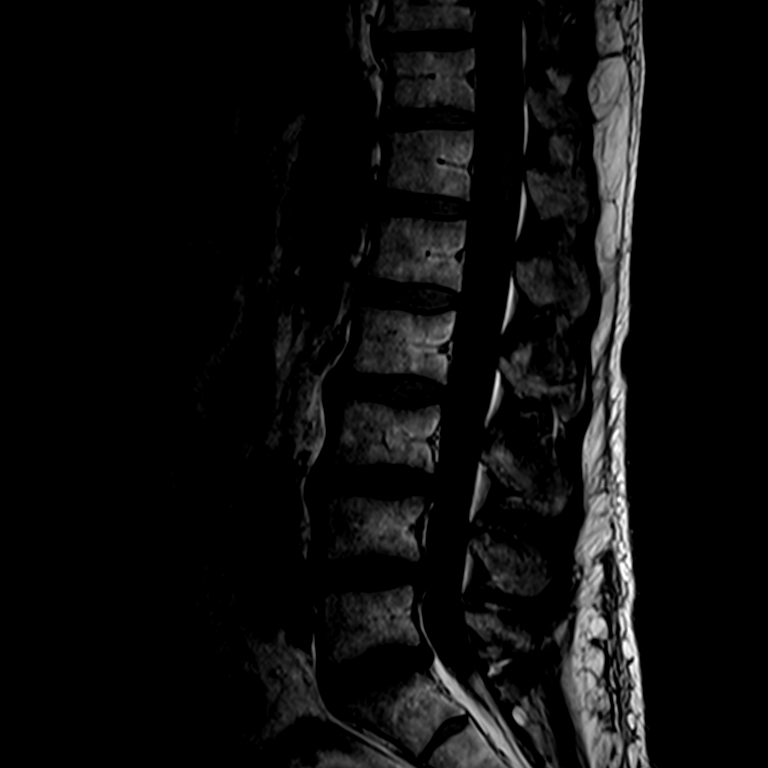
[im 15/15]
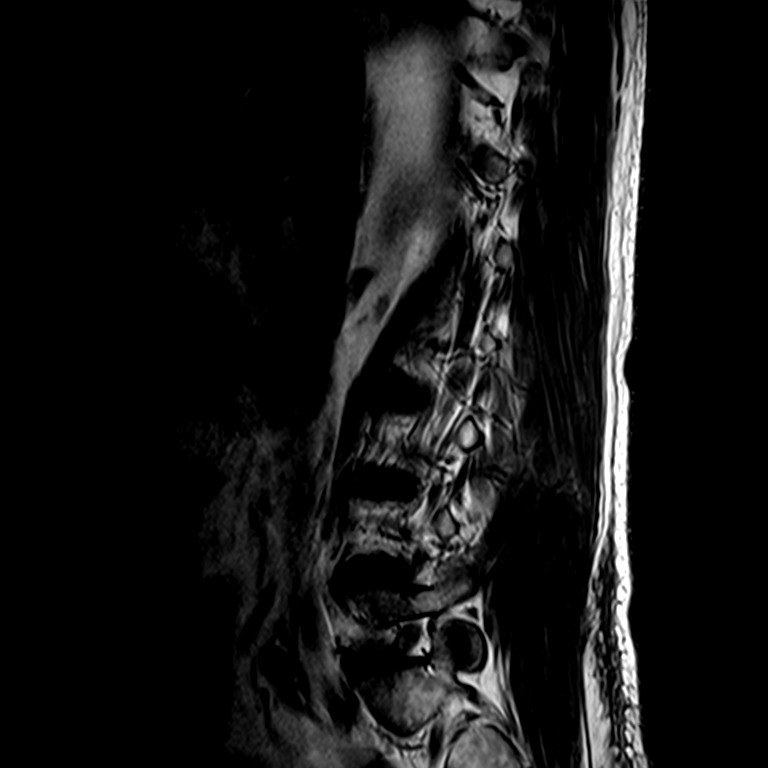

[Series 6: T2 · axial · 4.0mm · 0.25mm/px · z∈[-125,+3]mm · 3 of 39 slices shown (2 of 2)]
[im 6/39]
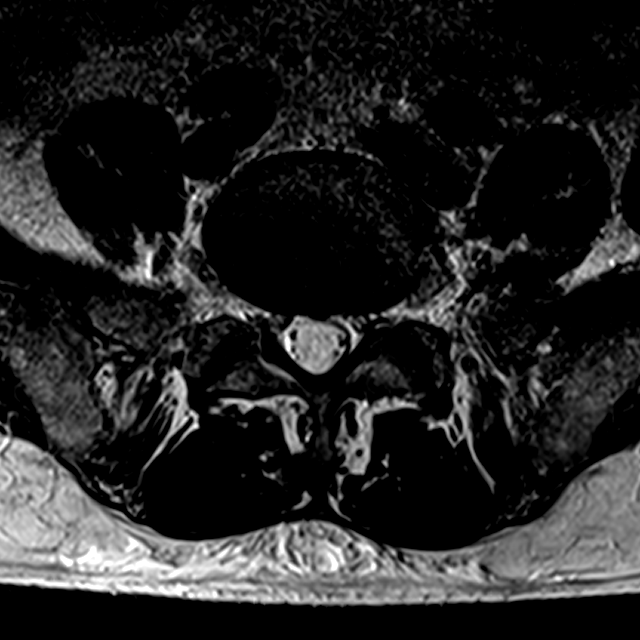
[im 20/39]
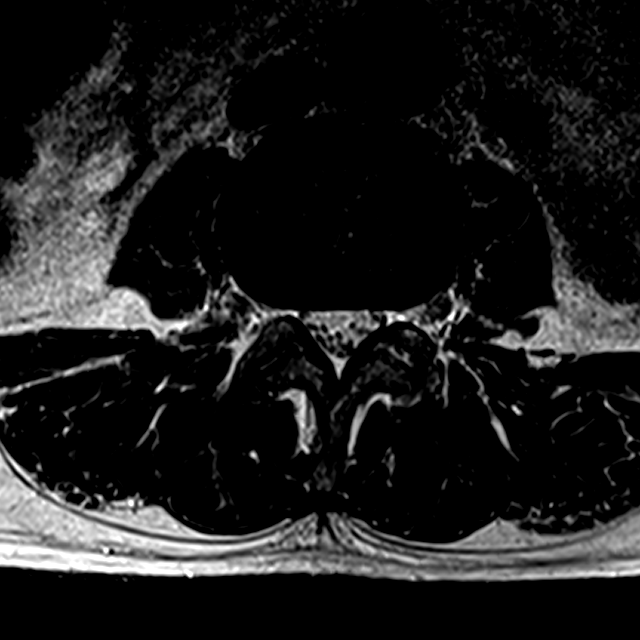
[im 33/39]
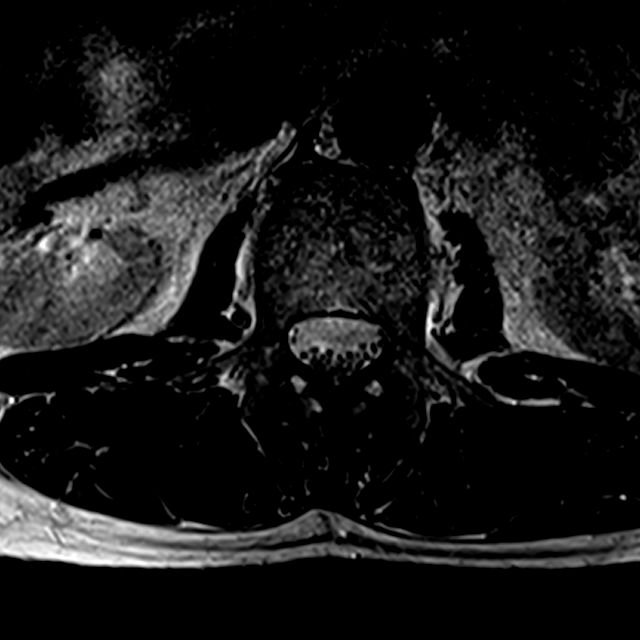

[Series 7: T1 · axial · 4.0mm · 0.25mm/px · z∈[-125,+3]mm · 3 of 39 slices shown (2 of 2)]
[im 6/39]
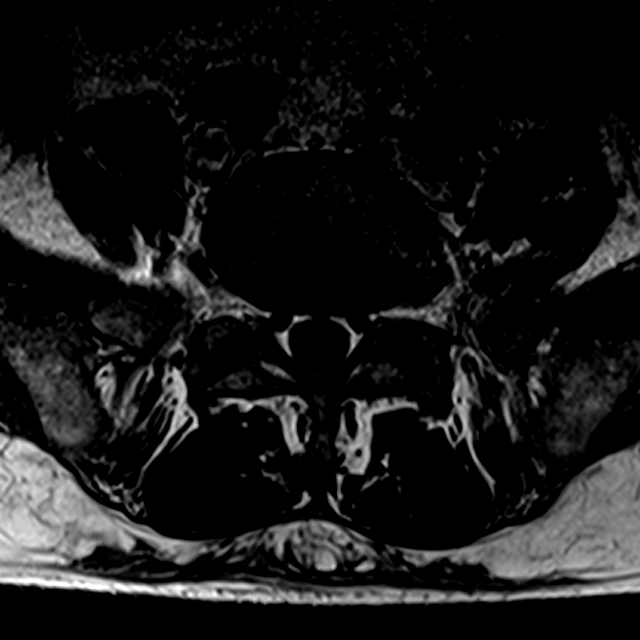
[im 20/39]
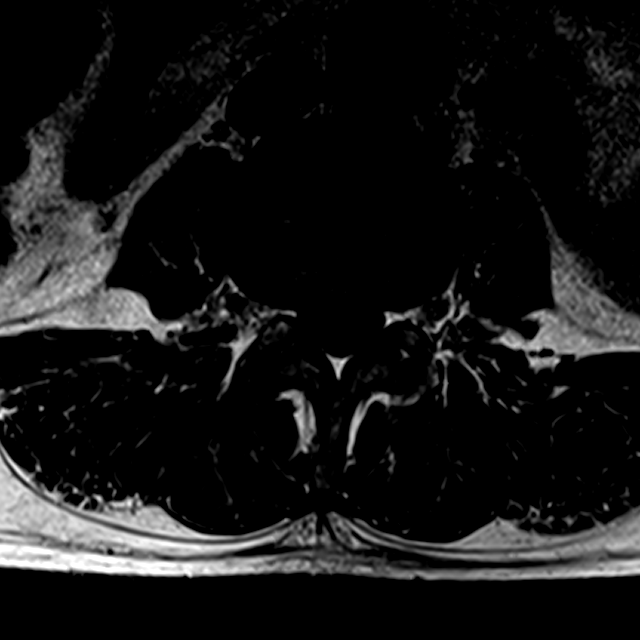
[im 33/39]
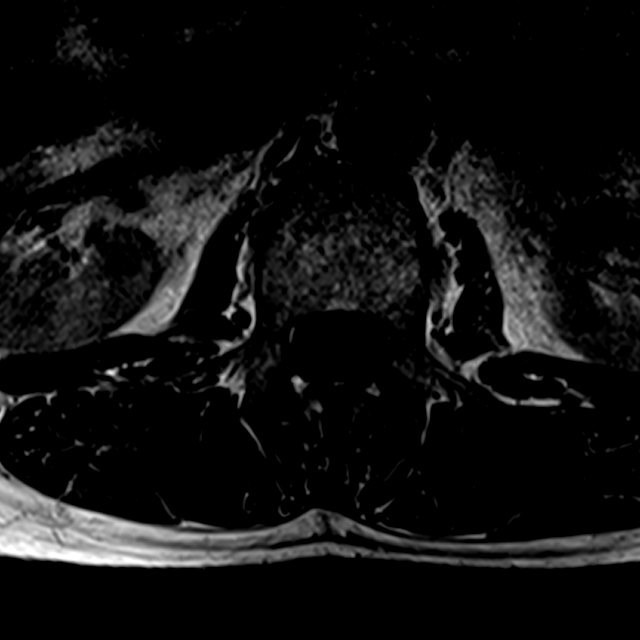

[14 of 48 positions shown; findings below may reference images not displayed]

FINDINGS: Segmentation: Standard. Lowest well-formed disc labeled the L5-S1
level.

Alignment: Straightening of the normal lumbar lordosis. Trace
retrolisthesis of L4 on L5.

Vertebrae: Vertebral body heights maintained without evidence for
acute or chronic fracture. Bone marrow signal intensity within
normal limits. No discrete or worrisome osseous lesions. No abnormal
marrow edema.

Conus medullaris and cauda equina: Conus extends to the L1 level.
Conus and cauda equina appear normal.

Paraspinal and other soft tissues: Paraspinous soft tissues within
normal limits. Infrarenal aortic aneurysm measures up to
approximately 3.4 cm in diameter. Right common iliac artery dilated
up to 19 mm. Visualized visceral structures otherwise unremarkable.

Disc levels:

L1-2:  Unremarkable.

L2-3: Mild diffuse disc bulge with disc desiccation. Disc bulging
slightly asymmetric to the right. Mild facet and ligament flavum
hypertrophy. No significant canal or foraminal stenosis.

L3-4: Mild circumferential disc bulge with disc desiccation and
intervertebral disc space narrowing. Superimposed small central disc
protrusion with slight inferior migration. Mild facet and ligament
flavum hypertrophy. No significant spinal stenosis. Foramina remain
patent.

L4-5: Diffuse disc bulge with disc desiccation and intervertebral
disc space narrowing. Superimposed broad-based left
subarticular/foraminal disc protrusion (series 6, image 28).
Superimposed mild facet and ligament flavum hypertrophy. Resultant
moderate left lateral recess and foraminal stenosis, potentially
affecting either the left L4 or descending L5 nerve roots. Moderate
right foraminal narrowing noted as well.

L5-S1: Shallow posterior disc bulge contacts the descending S1 nerve
roots without frank neural impingement or displacement. Mild facet
hypertrophy. Mild bilateral L5 foraminal stenosis.
IMPRESSION: 1. Broad-based left subarticular/foraminal disc protrusion with
resultant moderate left lateral recess and foraminal stenosis,
potentially affecting either the left L4 or descending L5 nerve
roots.
2. Shallow posterior disc bulge at L5-S1, contacting the descending
S1 nerve roots bilaterally without frank neural impingement.
3. Mild noncompressive disc bulging at L2-3 and L3-4 without
significant stenosis or neural impingement.
4. **An incidental finding of potential clinical significance has
been found. Intra-abdominal aortic aneurysm measuring up to 3.4 cm
in diameter. Recommend followup by ultrasound in 3 years. This
recommendation follows ACR consensus guidelines: White Paper of the
ACR Incidental Findings Committee II on Vascular Findings. [HOSPITAL] 6881; [DATE]. Aortic aneurysm NOS ([id number]-BYW.8)**

## 2019-08-26 ENCOUNTER — Encounter: Payer: Self-pay | Admitting: Family Medicine

## 2019-08-26 ENCOUNTER — Other Ambulatory Visit: Payer: Self-pay

## 2019-08-26 ENCOUNTER — Ambulatory Visit (INDEPENDENT_AMBULATORY_CARE_PROVIDER_SITE_OTHER): Payer: Medicare HMO | Admitting: Family Medicine

## 2019-08-26 VITALS — BP 164/80 | HR 76 | Temp 99.0°F | Resp 16 | Ht 71.0 in | Wt 210.0 lb

## 2019-08-26 DIAGNOSIS — Z23 Encounter for immunization: Secondary | ICD-10-CM | POA: Diagnosis not present

## 2019-08-26 DIAGNOSIS — H9201 Otalgia, right ear: Secondary | ICD-10-CM

## 2019-08-26 MED ORDER — AMOXICILLIN-POT CLAVULANATE 875-125 MG PO TABS: 1.0000 | ORAL_TABLET | Freq: Two times a day (BID) | ORAL | 0 refills | Status: DC

## 2019-08-26 NOTE — Progress Notes (Signed)
Subjective:    Patient ID: Tony Campos, male    DOB: [birth year], 53 y.o.   MRN: [id number]  HPI Patient presents today complaining of right-sided otalgia.  He states that starting last night his right ear began to hurt.  Earlier this morning, it really begin to hurt so he put his index finger in his right ear because it felt wet and there was blood on the tip of his finger.  He also complains of pain on the right side of his face and deep within the right ear radiating into his right mandible.  He denies any fevers or chills.  He denies any sore throat.  There is no lymphadenopathy in the neck.  There is no erythema in the posterior oropharynx.  However examination of the right auditory canal reveals an erythematous right auditory canal with blood along the floor the auditory canal.  The tympanic membrane itself is streaked with blood and is erythematous and dull.  There is no visible perforation.  Patient's blood pressure today is extremely high at 164/80.  Patient is also uncertain of any of the medication he is taking which is prescribed by other physicians.  He has not had any fasting lab work in over a year.  These are all outstanding issues that need to be addressed   Picture above is an attempt to take a picture of the view through the otoscope.  The tympanic membrane is indurated erythematous and streaked with blood along the surface Past Medical History:  Diagnosis Date  . AAA (abdominal aortic aneurysm) without rupture ([facility])    3.4 cm found on mri 2/20  . Acid reflux   . Anxiety   . Aortic atherosclerosis ([place])   . Depression   . Diverticulitis   . Genital warts   . Hyperlipidemia   . Hypertension   . Hypothyroidism   . Meningitis   . PAD (peripheral artery disease) ([place])   . SIADH (syndrome of inappropriate ADH production) ([place])   . Sleep apnea 12/2015   uses bipap  . Smoker   . Smoker   . Spondylosis   . Testosterone deficiency    Past Surgical History:  Procedure  Laterality Date  . CHOLECYSTECTOMY  2007  . CLOSED REDUCTION MANDIBLE N/A 03/09/2018   Procedure: CLOSED REDUCTION MANDIBULAR MMF;  Surgeon: Jerrell Belfast, MD;  Location: [place];  Service: ENT;  Laterality: N/A;  . KNEE SURGERY     right  . LAPAROSCOPIC APPENDECTOMY N/A 07/25/2018   Procedure: APPENDECTOMY LAPAROSCOPIC;  Surgeon: Aviva Signs, MD;  Location: [facility];  Service: General;  Laterality: N/A;  . MANDIBULAR HARDWARE REMOVAL N/A 05/06/2018   Procedure: REMOVAL OF MMF HARDWARE;  Surgeon: Jerrell Belfast, MD;  Location: [place];  Service: ENT;  Laterality: N/A;  . NASAL SEPTUM SURGERY    . NASAL SINUS SURGERY    . right sfa recanalization and stenting      [facility] 12/2015  . WRIST SURGERY     right and left   Current Outpatient Medications on File Prior to Visit  Medication Sig Dispense Refill  . AMITIZA 24 MCG capsule     . amphetamine-dextroamphetamine (ADDERALL XR) 20 MG 24 hr capsule     . Armodafinil 250 MG tablet Take 250 mg by mouth daily.   1  . b complex vitamins tablet Take by mouth.    . calcium-vitamin D (OSCAL WITH D) 500-200 MG-UNIT TABS tablet Take by mouth.    . clonazePAM (  KLONOPIN) 1 MG tablet Take 1 mg by mouth 2 (two) times daily.     . colestipol (COLESTID) 1 g tablet Take 1 tablet (1 g total) by mouth 2 (two) times daily. 60 tablet 0  . fentaNYL (DURAGESIC - DOSED MCG/HR) 50 MCG/HR Place 50 mcg onto the skin every 3 (three) days.   0  . furosemide (LASIX) 40 MG tablet TAKE 1 TABLET BY MOUTH TWICE DAILY AS NEEDED. (Patient taking differently: as needed. TAKE 1 TABLET BY MOUTH TWICE DAILY AS NEEDED.) 60 tablet 0  . glucosamine-chondroitin 500-400 MG tablet Take by mouth.    . lamoTRIgine (LAMICTAL) 200 MG tablet Take 200 mg by mouth 2 (two) times daily.     Marland Kitchen levothyroxine (SYNTHROID, LEVOTHROID) 125 MCG tablet Take 1 tablet (125 mcg total) by mouth daily before breakfast. 90 tablet 3  . losartan (COZAAR) 50 MG tablet TAKE 1 TABLET BY MOUTH  ONCE DAILY. 90 tablet 2  . Mesalamine 800 MG TBEC Take 1 tablet (800 mg total) by mouth 3 (three) times daily. 90 tablet 0  . methocarbamol (ROBAXIN) 750 MG tablet Take 750 mg by mouth 3 (three) times daily.     . metoprolol tartrate (LOPRESSOR) 25 MG tablet TAKE (1) TABLET TWICE DAILY. 180 tablet 3  . morphine (MSIR) 15 MG tablet Take 15 mg by mouth 2 (two) times daily.     . naloxegol oxalate (MOVANTIK) 25 MG TABS tablet Take 1 tablet (25 mg total) by mouth daily. 90 tablet 3  . Omega-3 1000 MG CAPS Take by mouth.    . pantoprazole (PROTONIX) 40 MG tablet TAKE (1) TABLET TWICE DAILY. 60 tablet 5  . potassium chloride (K-DUR) 10 MEQ tablet Take by mouth.    . pregabalin (LYRICA) 150 MG capsule     . rosuvastatin (CRESTOR) 40 MG tablet Take 1 tablet (40 mg total) by mouth daily. 90 tablet 3  . TRINTELLIX 20 MG TABS tablet Take 20 mg by mouth daily.    . valACYclovir (VALTREX) 500 MG tablet TAKE 1 TABLET BY MOUTH ONCE DAILY. 30 tablet 0  . [DISCONTINUED] esomeprazole (NEXIUM) 40 MG capsule Take 40 mg by mouth daily before breakfast.       Current Facility-Administered Medications on File Prior to Visit  Medication Dose Route Frequency Provider Last Rate Last Dose  . 0.9 %  sodium chloride infusion  500 mL Intravenous Once Thornton Park, MD       No Active Allergies Social History   Socioeconomic History  . Marital status: Married    Spouse name: Not on file  . Number of children: Not on file  . Years of education: Not on file  . Highest education level: Not on file  Occupational History  . Occupation: Training and development officer at [employer]  . Financial resource strain: Not on file  . Food insecurity    Worry: Not on file    Inability: Not on file  . Transportation needs    Medical: Not on file    Non-medical: Not on file  Tobacco Use  . Smoking status: Current Every Day Smoker    Packs/day: 1.00    Types: Cigarettes  . Smokeless tobacco: Never Used   Substance and Sexual Activity  . Alcohol use: No  . Drug use: No  . Sexual activity: Not on file  Lifestyle  . Physical activity    Days per week: Not on file    Minutes per session: Not on  file  . Stress: Not on file  Relationships  . Social Herbalist on phone: Not on file    Gets together: Not on file    Attends religious service: Not on file    Active member of club or organization: Not on file    Attends meetings of clubs or organizations: Not on file    Relationship status: Not on file  . Intimate partner violence    Fear of current or ex partner: Not on file    Emotionally abused: Not on file    Physically abused: Not on file    Forced sexual activity: Not on file  Other Topics Concern  . Not on file  Social History Narrative  . Not on file      Review of Systems  All other systems reviewed and are negative.      Objective:   Physical Exam Vitals signs reviewed.  Constitutional:      General: He is not in acute distress.    Appearance: Normal appearance. He is not ill-appearing or toxic-appearing.  HENT:     Right Ear: Tympanic membrane is injected and erythematous.     Left Ear: Tympanic membrane and ear canal normal.  Cardiovascular:     Rate and Rhythm: Normal rate and regular rhythm.  Pulmonary:     Effort: Pulmonary effort is normal.     Breath sounds: Normal breath sounds.  Neurological:     Mental Status: He is alert.           Assessment & Plan:  Acute otalgia, right  Patient appears to have otitis media with a rupture in 1 of the superficial capillaries causing bleeding.  Begin Augmentin 875 mg twice daily and reassess in 7 to 10 days.  I have asked the patient to bring in a list of his medication at that time so that we can update his medication list accurately.  Patient is convinced that his blood pressure today is due to pain and anxiety.  Of asked him to check it twice a day every day and bring that with him when he comes  back in 2 weeks so that we can modify his medication if necessary.  Patient is overdue for a physical exam and I have recommended that he schedule that at his convenience so that we can get fasting lab work.

## 2019-08-26 NOTE — Addendum Note (Signed)
Addended by: Shary Decamp B on: 08/26/2019 04:37 PM   Modules accepted: Orders

## 2019-08-27 DIAGNOSIS — G4730 Sleep apnea, unspecified: Secondary | ICD-10-CM | POA: Diagnosis not present

## 2019-08-27 DIAGNOSIS — F4312 Post-traumatic stress disorder, chronic: Secondary | ICD-10-CM | POA: Diagnosis not present

## 2019-08-27 DIAGNOSIS — G47419 Narcolepsy without cataplexy: Secondary | ICD-10-CM | POA: Diagnosis not present

## 2019-08-27 DIAGNOSIS — R69 Illness, unspecified: Secondary | ICD-10-CM | POA: Diagnosis not present

## 2019-08-28 ENCOUNTER — Other Ambulatory Visit: Payer: Self-pay | Admitting: Gastroenterology

## 2019-09-02 ENCOUNTER — Other Ambulatory Visit: Payer: Self-pay

## 2019-09-02 ENCOUNTER — Encounter: Payer: Self-pay | Admitting: Family Medicine

## 2019-09-02 ENCOUNTER — Telehealth: Payer: Self-pay | Admitting: Family Medicine

## 2019-09-02 ENCOUNTER — Ambulatory Visit (INDEPENDENT_AMBULATORY_CARE_PROVIDER_SITE_OTHER): Payer: Medicare HMO | Admitting: Family Medicine

## 2019-09-02 VITALS — BP 120/68 | HR 70 | Temp 98.2°F | Resp 18 | Ht 71.0 in | Wt 218.0 lb

## 2019-09-02 DIAGNOSIS — H9201 Otalgia, right ear: Secondary | ICD-10-CM | POA: Diagnosis not present

## 2019-09-02 DIAGNOSIS — H6020 Malignant otitis externa, unspecified ear: Secondary | ICD-10-CM | POA: Diagnosis not present

## 2019-09-02 MED ORDER — CEFDINIR 300 MG PO CAPS: 300.0000 mg | ORAL_CAPSULE | Freq: Two times a day (BID) | ORAL | 0 refills | Status: DC

## 2019-09-02 MED ORDER — NEOMYCIN-POLYMYXIN-HC 3.5-10000-1 OT SOLN: 4.0000 [drp] | Freq: Four times a day (QID) | OTIC | 0 refills | Status: DC

## 2019-09-02 NOTE — Telephone Encounter (Signed)
   At the end of clinic as staff was leaving , about 5:05pm pt was noted to be sitting in his car unresponsive, they banged on window, car was running he did not answer. He was slumped over. Once staff ran into office to get me and help from other nurses, we initiated 911, once back outside pt was awake and talking normally. States he just fell asleep He has polypharmacy and hypersomnia disorder States he was just texting his wife and fell asleep, if he stays still for a few minutes will drift off He is followed by neurology and multiple other providers  He denied any CP, SOB, HAD, has mild ear discomfort and dizziness but that was reason for visit today has inner ear and infection We called EMS at this time Wife came to pick pt up

## 2019-09-02 NOTE — Progress Notes (Signed)
Subjective:    Patient ID: Tony Campos, male    DOB: Dec 26, 1965, 53 y.o.   MRN: [id number]  HPI Patient presents today complaining of right-sided otalgia.  He states that starting last night his right ear began to hurt.  Earlier this morning, it really begin to hurt so he put his index finger in his right ear because it felt wet and there was blood on the tip of his finger.  He also complains of pain on the right side of his face and deep within the right ear radiating into his right mandible.  He denies any fevers or chills.  He denies any sore throat.  There is no lymphadenopathy in the neck.  There is no erythema in the posterior oropharynx.  However examination of the right auditory canal reveals an erythematous right auditory canal with blood along the floor the auditory canal.  The tympanic membrane itself is streaked with blood and is erythematous and dull.  There is no visible perforation.  Patient's blood pressure today is extremely high at 164/80.  Patient is also uncertain of any of the medication he is taking which is prescribed by other physicians.  He has not had any fasting lab work in over a year.  These are all outstanding issues that need to be addressed   Picture above is an attempt to take a picture of the view through the otoscope.  The tympanic membrane is indurated erythematous and streaked with blood along the surface.  AT that time, my plan was: Patient appears to have otitis media with a rupture in 1 of the superficial capillaries causing bleeding.  Begin Augmentin 875 mg twice daily and reassess in 7 to 10 days.  I have asked the patient to bring in a list of his medication at that time so that we can update his medication list accurately.  Patient is convinced that his blood pressure today is due to pain and anxiety.  Of asked him to check it twice a day every day and bring that with him when he comes back in 2 weeks so that we can modify his medication if necessary.   Patient is overdue for a physical exam and I have recommended that he schedule that at his convenience so that we can get fasting lab work.  09/02/19 Patient's ear pain is no better.  He is almost completed the Augmentin.  Immediately upon visual inspection, patient's tragus is erythematous with peeling scaling skin.  It is tender to palpation.  Upon visual inspection, the auditory canal is filled with copious white purulent material.  It is extremely painful simply to pull on his helix in order to examine the canal suggesting moderate to severe otitis externa.  I am unable to visualize the tympanic membrane due to the presence of purulent material obscuring my vision Past Medical History:  Diagnosis Date  . AAA (abdominal aortic aneurysm) without rupture (HCC)    3.4 cm found on mri 2/20  . Acid reflux   . Anxiety   . Aortic atherosclerosis ([place])   . Depression   . Diverticulitis   . Genital warts   . Hyperlipidemia   . Hypertension   . Hypothyroidism   . Meningitis   . PAD (peripheral artery disease) ([place])   . SIADH (syndrome of inappropriate ADH production) ([place])   . Sleep apnea 12/2015   uses bipap  . Smoker   . Smoker   . Spondylosis   . Testosterone deficiency  Past Surgical History:  Procedure Laterality Date  . CHOLECYSTECTOMY  2007  . CLOSED REDUCTION MANDIBLE N/A 03/09/2018   Procedure: CLOSED REDUCTION MANDIBULAR MMF;  Surgeon: Jerrell Belfast, MD;  Location: [facility];  Service: ENT;  Laterality: N/A;  . KNEE SURGERY     right  . LAPAROSCOPIC APPENDECTOMY N/A 07/25/2018   Procedure: APPENDECTOMY LAPAROSCOPIC;  Surgeon: Aviva Signs, MD;  Location: [facility];  Service: General;  Laterality: N/A;  . MANDIBULAR HARDWARE REMOVAL N/A 05/06/2018   Procedure: REMOVAL OF MMF HARDWARE;  Surgeon: Jerrell Belfast, MD;  Location: [facility];  Service: ENT;  Laterality: N/A;  . NASAL SEPTUM SURGERY    . NASAL SINUS SURGERY    . right sfa recanalization and stenting       [facility] 12/2015  . WRIST SURGERY     right and left   Current Outpatient Medications on File Prior to Visit  Medication Sig Dispense Refill  . AMITIZA 24 MCG capsule     . amoxicillin-clavulanate (AUGMENTIN) 875-125 MG tablet Take 1 tablet by mouth 2 (two) times daily. 20 tablet 0  . amphetamine-dextroamphetamine (ADDERALL XR) 20 MG 24 hr capsule     . Armodafinil 250 MG tablet Take 250 mg by mouth daily.   1  . b complex vitamins tablet Take by mouth.    . calcium-vitamin D (OSCAL WITH D) 500-200 MG-UNIT TABS tablet Take by mouth.    . clonazePAM (KLONOPIN) 1 MG tablet Take 1 mg by mouth 2 (two) times daily.     . colestipol (COLESTID) 1 g tablet TAKE (1) TABLET TWICE DAILY. 60 tablet 0  . fentaNYL (DURAGESIC - DOSED MCG/HR) 50 MCG/HR Place 50 mcg onto the skin every 3 (three) days.   0  . furosemide (LASIX) 40 MG tablet TAKE 1 TABLET BY MOUTH TWICE DAILY AS NEEDED. (Patient taking differently: as needed. TAKE 1 TABLET BY MOUTH TWICE DAILY AS NEEDED.) 60 tablet 0  . glucosamine-chondroitin 500-400 MG tablet Take by mouth.    . lamoTRIgine (LAMICTAL) 200 MG tablet Take 200 mg by mouth 2 (two) times daily.     Marland Kitchen levothyroxine (SYNTHROID, LEVOTHROID) 125 MCG tablet Take 1 tablet (125 mcg total) by mouth daily before breakfast. 90 tablet 3  . losartan (COZAAR) 50 MG tablet TAKE 1 TABLET BY MOUTH ONCE DAILY. 90 tablet 2  . Mesalamine 800 MG TBEC Take 1 tablet (800 mg total) by mouth 3 (three) times daily. 90 tablet 0  . methocarbamol (ROBAXIN) 750 MG tablet Take 750 mg by mouth 3 (three) times daily.     . metoprolol tartrate (LOPRESSOR) 25 MG tablet TAKE (1) TABLET TWICE DAILY. 180 tablet 3  . morphine (MSIR) 15 MG tablet Take 15 mg by mouth 2 (two) times daily.     . naloxegol oxalate (MOVANTIK) 25 MG TABS tablet Take 1 tablet (25 mg total) by mouth daily. 90 tablet 3  . Omega-3 1000 MG CAPS Take by mouth.    . pantoprazole (PROTONIX) 40 MG tablet TAKE (1) TABLET TWICE DAILY. 60 tablet 5  .  potassium chloride (K-DUR) 10 MEQ tablet Take by mouth.    . pregabalin (LYRICA) 150 MG capsule     . rosuvastatin (CRESTOR) 40 MG tablet Take 1 tablet (40 mg total) by mouth daily. 90 tablet 3  . TRINTELLIX 20 MG TABS tablet Take 20 mg by mouth daily.    . valACYclovir (VALTREX) 500 MG tablet TAKE 1 TABLET BY MOUTH ONCE DAILY. 30 tablet 0  . [  DISCONTINUED] esomeprazole (NEXIUM) 40 MG capsule Take 40 mg by mouth daily before breakfast.       Current Facility-Administered Medications on File Prior to Visit  Medication Dose Route Frequency Provider Last Rate Last Dose  . 0.9 %  sodium chloride infusion  500 mL Intravenous Once Thornton Park, MD       No Active Allergies Social History   Socioeconomic History  . Marital status: Married    Spouse name: Not on file  . Number of children: Not on file  . Years of education: Not on file  . Highest education level: Not on file  Occupational History  . Occupation: Training and development officer at [employer]  . Financial resource strain: Not on file  . Food insecurity    Worry: Not on file    Inability: Not on file  . Transportation needs    Medical: Not on file    Non-medical: Not on file  Tobacco Use  . Smoking status: Current Every Day Smoker    Packs/day: 1.00    Types: Cigarettes  . Smokeless tobacco: Never Used  Substance and Sexual Activity  . Alcohol use: No  . Drug use: No  . Sexual activity: Not on file  Lifestyle  . Physical activity    Days per week: Not on file    Minutes per session: Not on file  . Stress: Not on file  Relationships  . Social Herbalist on phone: Not on file    Gets together: Not on file    Attends religious service: Not on file    Active member of club or organization: Not on file    Attends meetings of clubs or organizations: Not on file    Relationship status: Not on file  . Intimate partner violence    Fear of current or ex partner: Not on file    Emotionally  abused: Not on file    Physically abused: Not on file    Forced sexual activity: Not on file  Other Topics Concern  . Not on file  Social History Narrative  . Not on file      Review of Systems  All other systems reviewed and are negative.      Objective:   Physical Exam Vitals signs reviewed.  Constitutional:      General: He is not in acute distress.    Appearance: Normal appearance. He is not ill-appearing or toxic-appearing.  HENT:     Right Ear: Decreased hearing noted. Drainage, swelling and tenderness present.     Left Ear: Tympanic membrane and ear canal normal.  Cardiovascular:     Rate and Rhythm: Normal rate and regular rhythm.  Pulmonary:     Effort: Pulmonary effort is normal.     Breath sounds: Normal breath sounds.  Neurological:     Mental Status: He is alert.    Patient reports popping and crackling in his ear with diminished hearing       Assessment & Plan:  Acute otalgia, right  Acute malignant otitis externa, unspecified laterality  Switch to Omnicef 300 mg p.o. twice daily for 10 days and add Cortisporin HC otic 4 drops 4 times daily to the right ear.  Recheck next week or sooner if worsening.  If worsening, will need to see ENT.

## 2019-09-04 DIAGNOSIS — M62830 Muscle spasm of back: Secondary | ICD-10-CM | POA: Diagnosis not present

## 2019-09-04 DIAGNOSIS — M47816 Spondylosis without myelopathy or radiculopathy, lumbar region: Secondary | ICD-10-CM | POA: Diagnosis not present

## 2019-09-04 DIAGNOSIS — G894 Chronic pain syndrome: Secondary | ICD-10-CM | POA: Diagnosis not present

## 2019-09-04 DIAGNOSIS — M47812 Spondylosis without myelopathy or radiculopathy, cervical region: Secondary | ICD-10-CM | POA: Diagnosis not present

## 2019-09-05 ENCOUNTER — Ambulatory Visit: Payer: Medicare HMO | Admitting: Family Medicine

## 2019-09-12 ENCOUNTER — Other Ambulatory Visit: Payer: Self-pay

## 2019-09-12 ENCOUNTER — Ambulatory Visit (INDEPENDENT_AMBULATORY_CARE_PROVIDER_SITE_OTHER): Payer: Medicare HMO | Admitting: Family Medicine

## 2019-09-12 VITALS — BP 100/74 | HR 76 | Temp 98.8°F | Resp 18 | Ht 71.0 in | Wt 212.0 lb

## 2019-09-12 DIAGNOSIS — Z13220 Encounter for screening for lipoid disorders: Secondary | ICD-10-CM

## 2019-09-12 DIAGNOSIS — H6020 Malignant otitis externa, unspecified ear: Secondary | ICD-10-CM | POA: Diagnosis not present

## 2019-09-12 DIAGNOSIS — Z125 Encounter for screening for malignant neoplasm of prostate: Secondary | ICD-10-CM

## 2019-09-12 MED ORDER — CIPROFLOXACIN-DEXAMETHASONE 0.3-0.1 % OT SUSP: 4.0000 [drp] | Freq: Two times a day (BID) | OTIC | 0 refills | Status: DC

## 2019-09-12 NOTE — Progress Notes (Signed)
Subjective:    Patient ID: Tony Campos, male    DOB: 1966/03/21, 53 y.o.   MRN: [id number]  HPI Patient presents today complaining of right-sided otalgia.  He states that starting last night his right ear began to hurt.  Earlier this morning, it really begin to hurt so he put his index finger in his right ear because it felt wet and there was blood on the tip of his finger.  He also complains of pain on the right side of his face and deep within the right ear radiating into his right mandible.  He denies any fevers or chills.  He denies any sore throat.  There is no lymphadenopathy in the neck.  There is no erythema in the posterior oropharynx.  However examination of the right auditory canal reveals an erythematous right auditory canal with blood along the floor the auditory canal.  The tympanic membrane itself is streaked with blood and is erythematous and dull.  There is no visible perforation.  Patient's blood pressure today is extremely high at 164/80.  Patient is also uncertain of any of the medication he is taking which is prescribed by other physicians.  He has not had any fasting lab work in over a year.  These are all outstanding issues that need to be addressed   Picture above is an attempt to take a picture of the view through the otoscope.  The tympanic membrane is indurated erythematous and streaked with blood along the surface.  AT that time, my plan was: Patient appears to have otitis media with a rupture in 1 of the superficial capillaries causing bleeding.  Begin Augmentin 875 mg twice daily and reassess in 7 to 10 days.  I have asked the patient to bring in a list of his medication at that time so that we can update his medication list accurately.  Patient is convinced that his blood pressure today is due to pain and anxiety.  Of asked him to check it twice a day every day and bring that with him when he comes back in 2 weeks so that we can modify his medication if necessary.   Patient is overdue for a physical exam and I have recommended that he schedule that at his convenience so that we can get fasting lab work.  09/02/19 Patient's ear pain is no better.  He is almost completed the Augmentin.  Immediately upon visual inspection, patient's tragus is erythematous with peeling scaling skin.  It is tender to palpation.  Upon visual inspection, the auditory canal is filled with copious white purulent material.  It is extremely painful simply to pull on his helix in order to examine the canal suggesting moderate to severe otitis externa.  I am unable to visualize the tympanic membrane due to the presence of purulent material obscuring my vision.  At that time, my plan was: Switch to Omnicef 300 mg p.o. twice daily for 10 days and add Cortisporin HC otic 4 drops 4 times daily to the right ear.  Recheck next week or sooner if worsening.  If worsening, will need to see ENT.  09/12/19 Patient states that his ear feels better on the right however he is now starting to have pain on the left.  On examination today, the right auditory canal still has copious amounts of white purulent material lining the ear canal.  He states that his hearing is 75% better although it still less than normal.  However he has significant pain with  examination of the left auditory canal.  The tympanic membrane in the left ear looks completely normal.  There is no erythema however the canal itself is erythematous and aggravated suggesting otitis externa on that side as well.  He is also due to check fasting lab work today Past Medical History:  Diagnosis Date  . AAA (abdominal aortic aneurysm) without rupture (HCC)    3.4 cm found on mri 2/20  . Acid reflux   . Anxiety   . Aortic atherosclerosis ([place])   . Depression   . Diverticulitis   . Genital warts   . Hyperlipidemia   . Hypertension   . Hypothyroidism   . Meningitis   . PAD (peripheral artery disease) ([place])   . SIADH (syndrome of inappropriate  ADH production) ([place])   . Sleep apnea 12/2015   uses bipap  . Smoker   . Smoker   . Spondylosis   . Testosterone deficiency    Past Surgical History:  Procedure Laterality Date  . CHOLECYSTECTOMY  2007  . CLOSED REDUCTION MANDIBLE N/A 03/09/2018   Procedure: CLOSED REDUCTION MANDIBULAR MMF;  Surgeon: Jerrell Belfast, MD;  Location: [facility];  Service: ENT;  Laterality: N/A;  . KNEE SURGERY     right  . LAPAROSCOPIC APPENDECTOMY N/A 07/25/2018   Procedure: APPENDECTOMY LAPAROSCOPIC;  Surgeon: Aviva Signs, MD;  Location: [facility];  Service: General;  Laterality: N/A;  . MANDIBULAR HARDWARE REMOVAL N/A 05/06/2018   Procedure: REMOVAL OF MMF HARDWARE;  Surgeon: Jerrell Belfast, MD;  Location: [facility];  Service: ENT;  Laterality: N/A;  . NASAL SEPTUM SURGERY    . NASAL SINUS SURGERY    . right sfa recanalization and stenting      [facility] 12/2015  . WRIST SURGERY     right and left   Current Outpatient Medications on File Prior to Visit  Medication Sig Dispense Refill  . AMITIZA 24 MCG capsule     . amphetamine-dextroamphetamine (ADDERALL XR) 20 MG 24 hr capsule     . aspirin EC 81 MG tablet Take 81 mg by mouth daily.    Marland Kitchen b complex vitamins tablet Take by mouth.    . cefdinir (OMNICEF) 300 MG capsule Take 1 capsule (300 mg total) by mouth 2 (two) times daily. 20 capsule 0  . clonazePAM (KLONOPIN) 1 MG tablet Take 1 mg by mouth 2 (two) times daily.     . colestipol (COLESTID) 1 g tablet TAKE (1) TABLET TWICE DAILY. 60 tablet 0  . DULoxetine (CYMBALTA) 30 MG capsule Take 30 mg by mouth daily.    . fentaNYL (DURAGESIC - DOSED MCG/HR) 50 MCG/HR Place 50 mcg onto the skin every 3 (three) days.   0  . furosemide (LASIX) 40 MG tablet TAKE 1 TABLET BY MOUTH TWICE DAILY AS NEEDED. (Patient taking differently: as needed. TAKE 1 TABLET BY MOUTH TWICE DAILY AS NEEDED.) 60 tablet 0  . Gabapentin, Once-Daily, (GRALISE) 600 MG TABS Take by mouth.    . lamoTRIgine (LAMICTAL) 200 MG  tablet Take 200 mg by mouth 2 (two) times daily.     Marland Kitchen levothyroxine (SYNTHROID, LEVOTHROID) 125 MCG tablet Take 1 tablet (125 mcg total) by mouth daily before breakfast. 90 tablet 3  . losartan (COZAAR) 50 MG tablet TAKE 1 TABLET BY MOUTH ONCE DAILY. 90 tablet 2  . lurasidone (LATUDA) 40 MG TABS tablet Take 40 mg by mouth daily with breakfast.    . methocarbamol (ROBAXIN) 750 MG tablet Take 750 mg by  mouth 3 (three) times daily.     . metoprolol tartrate (LOPRESSOR) 25 MG tablet TAKE (1) TABLET TWICE DAILY. 180 tablet 3  . morphine (MSIR) 15 MG tablet Take 15 mg by mouth 2 (two) times daily.     . naloxegol oxalate (MOVANTIK) 25 MG TABS tablet Take 1 tablet (25 mg total) by mouth daily. 90 tablet 3  . neomycin-polymyxin-hydrocortisone (CORTISPORIN) OTIC solution Place 4 drops into the right ear 4 (four) times daily. 10 mL 0  . Omega-3 1000 MG CAPS Take by mouth.    . pantoprazole (PROTONIX) 40 MG tablet TAKE (1) TABLET TWICE DAILY. 60 tablet 5  . potassium chloride (K-DUR) 10 MEQ tablet Take by mouth.    . rosuvastatin (CRESTOR) 40 MG tablet Take 1 tablet (40 mg total) by mouth daily. 90 tablet 3  . Solriamfetol HCl (SUNOSI) 75 MG TABS Take by mouth.    . valACYclovir (VALTREX) 500 MG tablet TAKE 1 TABLET BY MOUTH ONCE DAILY. 30 tablet 0  . [DISCONTINUED] esomeprazole (NEXIUM) 40 MG capsule Take 40 mg by mouth daily before breakfast.       Current Facility-Administered Medications on File Prior to Visit  Medication Dose Route Frequency Provider Last Rate Last Dose  . 0.9 %  sodium chloride infusion  500 mL Intravenous Once Thornton Park, MD       No Active Allergies Social History   Socioeconomic History  . Marital status: Married    Spouse name: Not on file  . Number of children: Not on file  . Years of education: Not on file  . Highest education level: Not on file  Occupational History  . Occupation: Training and development officer at [employer]  . Financial resource  strain: Not on file  . Food insecurity    Worry: Not on file    Inability: Not on file  . Transportation needs    Medical: Not on file    Non-medical: Not on file  Tobacco Use  . Smoking status: Current Every Day Smoker    Packs/day: 1.00    Types: Cigarettes  . Smokeless tobacco: Never Used  Substance and Sexual Activity  . Alcohol use: No  . Drug use: No  . Sexual activity: Not on file  Lifestyle  . Physical activity    Days per week: Not on file    Minutes per session: Not on file  . Stress: Not on file  Relationships  . Social Herbalist on phone: Not on file    Gets together: Not on file    Attends religious service: Not on file    Active member of club or organization: Not on file    Attends meetings of clubs or organizations: Not on file    Relationship status: Not on file  . Intimate partner violence    Fear of current or ex partner: Not on file    Emotionally abused: Not on file    Physically abused: Not on file    Forced sexual activity: Not on file  Other Topics Concern  . Not on file  Social History Narrative  . Not on file      Review of Systems  HENT: Positive for ear pain.   All other systems reviewed and are negative.      Objective:   Physical Exam Vitals signs reviewed.  Constitutional:      General: He is not in acute distress.    Appearance: Normal appearance.  He is not ill-appearing or toxic-appearing.  HENT:     Right Ear: Decreased hearing noted. Drainage, swelling and tenderness present.     Left Ear: Tympanic membrane and ear canal normal.  Cardiovascular:     Rate and Rhythm: Normal rate and regular rhythm.  Pulmonary:     Effort: Pulmonary effort is normal.     Breath sounds: Normal breath sounds.  Neurological:     Mental Status: He is alert.    Patient reports popping and crackling in his ear with diminished hearing       Assessment & Plan:  Screening cholesterol level - Plan: CBC with  Differential/Platelet, COMPLETE METABOLIC PANEL WITH GFR, Lipid panel  Prostate cancer screening - Plan: PSA  Acute malignant otitis externa, unspecified laterality - Plan: Ambulatory referral to ENT I will give the patient Ciprodex drops.  He can put 4 drops in each ear canal twice a day for 7 days however I have recommended an ENT consultation.  I believe he would benefit from the antibiotic desiccating powder that they use as he has now tried and failed 2 different oral antibiotics as well as topical drops.  I will screen for prostate cancer with a PSA.  I will also check his cholesterol and screen with a CBC, CMP, and fasting lipid panel.

## 2019-09-13 LAB — CBC WITH DIFFERENTIAL/PLATELET
Absolute Monocytes: 943 cells/uL (ref 200–950)
Basophils Absolute: 79 cells/uL (ref 0–200)
Basophils Relative: 0.6 %
Eosinophils Absolute: 144 cells/uL (ref 15–500)
Eosinophils Relative: 1.1 %
HCT: 37 % — ABNORMAL LOW (ref 38.5–50.0)
Hemoglobin: 12.6 g/dL — ABNORMAL LOW (ref 13.2–17.1)
Lymphs Abs: 3131 cells/uL (ref 850–3900)
MCH: 30.8 pg (ref 27.0–33.0)
MCHC: 34.1 g/dL (ref 32.0–36.0)
MCV: 90.5 fL (ref 80.0–100.0)
MPV: 9.4 fL (ref 7.5–12.5)
Monocytes Relative: 7.2 %
Neutro Abs: 8803 cells/uL — ABNORMAL HIGH (ref 1500–7800)
Neutrophils Relative %: 67.2 %
Platelets: 227 10*3/uL (ref 140–400)
RBC: 4.09 10*6/uL — ABNORMAL LOW (ref 4.20–5.80)
RDW: 13.5 % (ref 11.0–15.0)
Total Lymphocyte: 23.9 %
WBC: 13.1 10*3/uL — ABNORMAL HIGH (ref 3.8–10.8)

## 2019-09-13 LAB — COMPLETE METABOLIC PANEL WITH GFR
AG Ratio: 2.4 (calc) (ref 1.0–2.5)
ALT: 9 U/L (ref 9–46)
AST: 17 U/L (ref 10–35)
Albumin: 4.3 g/dL (ref 3.6–5.1)
Alkaline phosphatase (APISO): 90 U/L (ref 35–144)
BUN: 14 mg/dL (ref 7–25)
CO2: 29 mmol/L (ref 20–32)
Calcium: 9.6 mg/dL (ref 8.6–10.3)
Chloride: 100 mmol/L (ref 98–110)
Creat: 1.13 mg/dL (ref 0.70–1.33)
GFR, Est African American: 87 mL/min/{1.73_m2} (ref >=60)
GFR, Est Non African American: 75 mL/min/{1.73_m2} (ref >=60)
Globulin: 1.8 g/dL (calc) — ABNORMAL LOW (ref 1.9–3.7)
Glucose, Bld: 91 mg/dL (ref 65–99)
Potassium: 4.9 mmol/L (ref 3.5–5.3)
Sodium: 138 mmol/L (ref 135–146)
Total Bilirubin: 0.4 mg/dL (ref 0.2–1.2)
Total Protein: 6.1 g/dL (ref 6.1–8.1)

## 2019-09-13 LAB — LIPID PANEL
Cholesterol: 164 mg/dL (ref <200)
HDL: 43 mg/dL (ref >=40)
LDL Cholesterol (Calc): 88 mg/dL (calc)
Non-HDL Cholesterol (Calc): 121 mg/dL (calc) (ref <130)
Total CHOL/HDL Ratio: 3.8 (calc) (ref <5.0)
Triglycerides: 251 mg/dL — ABNORMAL HIGH (ref <150)

## 2019-09-13 LAB — PSA: PSA: 0.7 ng/mL (ref <=4.0)

## 2019-09-16 ENCOUNTER — Other Ambulatory Visit: Payer: Self-pay | Admitting: Family Medicine

## 2019-09-17 ENCOUNTER — Encounter: Payer: Self-pay | Admitting: Family Medicine

## 2019-09-18 ENCOUNTER — Other Ambulatory Visit: Payer: Self-pay

## 2019-09-18 ENCOUNTER — Ambulatory Visit (INDEPENDENT_AMBULATORY_CARE_PROVIDER_SITE_OTHER): Payer: Medicare HMO | Admitting: Otolaryngology

## 2019-09-18 DIAGNOSIS — H9209 Otalgia, unspecified ear: Secondary | ICD-10-CM

## 2019-09-18 DIAGNOSIS — H903 Sensorineural hearing loss, bilateral: Secondary | ICD-10-CM | POA: Diagnosis not present

## 2019-09-23 DIAGNOSIS — M5126 Other intervertebral disc displacement, lumbar region: Secondary | ICD-10-CM | POA: Diagnosis not present

## 2019-10-06 DIAGNOSIS — G894 Chronic pain syndrome: Secondary | ICD-10-CM | POA: Diagnosis not present

## 2019-10-06 DIAGNOSIS — M47816 Spondylosis without myelopathy or radiculopathy, lumbar region: Secondary | ICD-10-CM | POA: Diagnosis not present

## 2019-10-06 DIAGNOSIS — M62830 Muscle spasm of back: Secondary | ICD-10-CM | POA: Diagnosis not present

## 2019-10-06 DIAGNOSIS — M47812 Spondylosis without myelopathy or radiculopathy, cervical region: Secondary | ICD-10-CM | POA: Diagnosis not present

## 2019-10-07 ENCOUNTER — Other Ambulatory Visit: Payer: Self-pay | Admitting: Gastroenterology

## 2019-10-20 DIAGNOSIS — G4730 Sleep apnea, unspecified: Secondary | ICD-10-CM | POA: Diagnosis not present

## 2019-10-20 DIAGNOSIS — F4312 Post-traumatic stress disorder, chronic: Secondary | ICD-10-CM | POA: Diagnosis not present

## 2019-10-20 DIAGNOSIS — R69 Illness, unspecified: Secondary | ICD-10-CM | POA: Diagnosis not present

## 2019-10-20 DIAGNOSIS — G47419 Narcolepsy without cataplexy: Secondary | ICD-10-CM | POA: Diagnosis not present

## 2019-11-03 DIAGNOSIS — M47816 Spondylosis without myelopathy or radiculopathy, lumbar region: Secondary | ICD-10-CM | POA: Diagnosis not present

## 2019-11-03 DIAGNOSIS — M47812 Spondylosis without myelopathy or radiculopathy, cervical region: Secondary | ICD-10-CM | POA: Diagnosis not present

## 2019-11-03 DIAGNOSIS — G894 Chronic pain syndrome: Secondary | ICD-10-CM | POA: Diagnosis not present

## 2019-11-03 DIAGNOSIS — M62830 Muscle spasm of back: Secondary | ICD-10-CM | POA: Diagnosis not present

## 2019-12-02 ENCOUNTER — Other Ambulatory Visit: Payer: Self-pay | Admitting: Gastroenterology

## 2019-12-18 ENCOUNTER — Other Ambulatory Visit: Payer: Self-pay | Admitting: *Deleted

## 2019-12-18 MED ORDER — ROSUVASTATIN CALCIUM 40 MG PO TABS: 40.0000 mg | ORAL_TABLET | Freq: Every day | ORAL | 3 refills | Status: DC

## 2019-12-25 ENCOUNTER — Other Ambulatory Visit: Payer: Self-pay | Admitting: Family Medicine

## 2019-12-25 DIAGNOSIS — M47812 Spondylosis without myelopathy or radiculopathy, cervical region: Secondary | ICD-10-CM | POA: Diagnosis not present

## 2019-12-25 DIAGNOSIS — G894 Chronic pain syndrome: Secondary | ICD-10-CM | POA: Diagnosis not present

## 2019-12-25 DIAGNOSIS — I714 Abdominal aortic aneurysm, without rupture, unspecified: Secondary | ICD-10-CM

## 2019-12-25 DIAGNOSIS — M62830 Muscle spasm of back: Secondary | ICD-10-CM | POA: Diagnosis not present

## 2019-12-25 DIAGNOSIS — M47816 Spondylosis without myelopathy or radiculopathy, lumbar region: Secondary | ICD-10-CM | POA: Diagnosis not present

## 2019-12-31 ENCOUNTER — Encounter: Payer: Self-pay | Admitting: Family Medicine

## 2020-01-05 ENCOUNTER — Other Ambulatory Visit: Payer: Self-pay | Admitting: Gastroenterology

## 2020-01-05 ENCOUNTER — Other Ambulatory Visit: Payer: Self-pay

## 2020-01-05 ENCOUNTER — Ambulatory Visit (HOSPITAL_COMMUNITY)
Admission: RE | Admit: 2020-01-05 | Discharge: 2020-01-05 | Disposition: A | Discharge status: Home / Self Care | Payer: Medicare HMO | Source: Ambulatory Visit | Attending: Family Medicine | Admitting: Family Medicine

## 2020-01-05 DIAGNOSIS — I714 Abdominal aortic aneurysm, without rupture, unspecified: Secondary | ICD-10-CM

## 2020-01-07 ENCOUNTER — Telehealth: Payer: Self-pay | Admitting: Family Medicine

## 2020-01-07 NOTE — Telephone Encounter (Signed)
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

## 2020-01-08 NOTE — Telephone Encounter (Signed)
A simple cyst requires no treatment or monitoring.  Should not cause a problem.

## 2020-01-08 NOTE — Telephone Encounter (Signed)
Pt aware via vm 

## 2020-01-19 ENCOUNTER — Other Ambulatory Visit: Payer: Self-pay | Admitting: Family Medicine

## 2020-01-19 MED ORDER — LEVOTHYROXINE SODIUM 125 MCG PO TABS: 125.0000 ug | ORAL_TABLET | Freq: Every day | ORAL | 3 refills | Status: DC

## 2020-01-19 MED ORDER — PANTOPRAZOLE SODIUM 40 MG PO TBEC: DELAYED_RELEASE_TABLET | ORAL | 3 refills | Status: DC

## 2020-01-22 DIAGNOSIS — G894 Chronic pain syndrome: Secondary | ICD-10-CM | POA: Diagnosis not present

## 2020-01-22 DIAGNOSIS — M47812 Spondylosis without myelopathy or radiculopathy, cervical region: Secondary | ICD-10-CM | POA: Diagnosis not present

## 2020-01-22 DIAGNOSIS — M62830 Muscle spasm of back: Secondary | ICD-10-CM | POA: Diagnosis not present

## 2020-01-22 DIAGNOSIS — M47816 Spondylosis without myelopathy or radiculopathy, lumbar region: Secondary | ICD-10-CM | POA: Diagnosis not present

## 2020-02-18 DIAGNOSIS — M47816 Spondylosis without myelopathy or radiculopathy, lumbar region: Secondary | ICD-10-CM | POA: Diagnosis not present

## 2020-02-18 DIAGNOSIS — M47812 Spondylosis without myelopathy or radiculopathy, cervical region: Secondary | ICD-10-CM | POA: Diagnosis not present

## 2020-02-18 DIAGNOSIS — G894 Chronic pain syndrome: Secondary | ICD-10-CM | POA: Diagnosis not present

## 2020-02-18 DIAGNOSIS — M62830 Muscle spasm of back: Secondary | ICD-10-CM | POA: Diagnosis not present

## 2020-03-03 DIAGNOSIS — R69 Illness, unspecified: Secondary | ICD-10-CM | POA: Diagnosis not present

## 2020-03-04 ENCOUNTER — Other Ambulatory Visit: Payer: Self-pay | Admitting: Family Medicine

## 2020-03-18 DIAGNOSIS — G894 Chronic pain syndrome: Secondary | ICD-10-CM | POA: Diagnosis not present

## 2020-03-18 DIAGNOSIS — M47812 Spondylosis without myelopathy or radiculopathy, cervical region: Secondary | ICD-10-CM | POA: Diagnosis not present

## 2020-03-18 DIAGNOSIS — M47816 Spondylosis without myelopathy or radiculopathy, lumbar region: Secondary | ICD-10-CM | POA: Diagnosis not present

## 2020-03-18 DIAGNOSIS — M62830 Muscle spasm of back: Secondary | ICD-10-CM | POA: Diagnosis not present

## 2020-03-25 ENCOUNTER — Other Ambulatory Visit: Payer: Self-pay | Admitting: Family Medicine

## 2020-03-25 DIAGNOSIS — R000 Tachycardia, unspecified: Secondary | ICD-10-CM

## 2020-04-15 DIAGNOSIS — G894 Chronic pain syndrome: Secondary | ICD-10-CM | POA: Diagnosis not present

## 2020-04-15 DIAGNOSIS — M47812 Spondylosis without myelopathy or radiculopathy, cervical region: Secondary | ICD-10-CM | POA: Diagnosis not present

## 2020-04-15 DIAGNOSIS — M62830 Muscle spasm of back: Secondary | ICD-10-CM | POA: Diagnosis not present

## 2020-04-15 DIAGNOSIS — M47816 Spondylosis without myelopathy or radiculopathy, lumbar region: Secondary | ICD-10-CM | POA: Diagnosis not present

## 2020-05-07 ENCOUNTER — Other Ambulatory Visit: Payer: Self-pay | Admitting: Family Medicine

## 2020-05-07 DIAGNOSIS — R000 Tachycardia, unspecified: Secondary | ICD-10-CM

## 2020-05-17 DIAGNOSIS — Z79891 Long term (current) use of opiate analgesic: Secondary | ICD-10-CM | POA: Diagnosis not present

## 2020-05-17 DIAGNOSIS — M47812 Spondylosis without myelopathy or radiculopathy, cervical region: Secondary | ICD-10-CM | POA: Diagnosis not present

## 2020-05-17 DIAGNOSIS — M62830 Muscle spasm of back: Secondary | ICD-10-CM | POA: Diagnosis not present

## 2020-05-17 DIAGNOSIS — M47816 Spondylosis without myelopathy or radiculopathy, lumbar region: Secondary | ICD-10-CM | POA: Diagnosis not present

## 2020-05-17 DIAGNOSIS — G894 Chronic pain syndrome: Secondary | ICD-10-CM | POA: Diagnosis not present

## 2020-05-19 DIAGNOSIS — M5481 Occipital neuralgia: Secondary | ICD-10-CM | POA: Diagnosis not present

## 2020-06-14 DIAGNOSIS — M47812 Spondylosis without myelopathy or radiculopathy, cervical region: Secondary | ICD-10-CM | POA: Diagnosis not present

## 2020-06-14 DIAGNOSIS — M47816 Spondylosis without myelopathy or radiculopathy, lumbar region: Secondary | ICD-10-CM | POA: Diagnosis not present

## 2020-06-14 DIAGNOSIS — M62830 Muscle spasm of back: Secondary | ICD-10-CM | POA: Diagnosis not present

## 2020-06-14 DIAGNOSIS — G894 Chronic pain syndrome: Secondary | ICD-10-CM | POA: Diagnosis not present

## 2020-06-22 ENCOUNTER — Other Ambulatory Visit: Payer: Self-pay

## 2020-06-22 ENCOUNTER — Telehealth: Payer: Self-pay | Admitting: Family Medicine

## 2020-06-22 MED ORDER — LOSARTAN POTASSIUM 50 MG PO TABS: 50.0000 mg | ORAL_TABLET | Freq: Every day | ORAL | 0 refills | Status: DC

## 2020-06-22 NOTE — Telephone Encounter (Signed)
Refill sent in Pt does need OV

## 2020-06-22 NOTE — Telephone Encounter (Signed)
CB# [phone] Refill Cozaar 50 mg

## 2020-06-29 ENCOUNTER — Ambulatory Visit (INDEPENDENT_AMBULATORY_CARE_PROVIDER_SITE_OTHER): Payer: Medicare HMO | Admitting: Family Medicine

## 2020-06-29 ENCOUNTER — Other Ambulatory Visit: Payer: Self-pay

## 2020-06-29 VITALS — BP 150/80 | HR 71 | Temp 97.8°F | Ht 71.0 in | Wt 232.0 lb

## 2020-06-29 DIAGNOSIS — R5382 Chronic fatigue, unspecified: Secondary | ICD-10-CM

## 2020-06-29 DIAGNOSIS — G4733 Obstructive sleep apnea (adult) (pediatric): Secondary | ICD-10-CM | POA: Diagnosis not present

## 2020-06-29 DIAGNOSIS — G4710 Hypersomnia, unspecified: Secondary | ICD-10-CM

## 2020-06-29 DIAGNOSIS — Z79899 Other long term (current) drug therapy: Secondary | ICD-10-CM

## 2020-06-29 DIAGNOSIS — R6889 Other general symptoms and signs: Secondary | ICD-10-CM | POA: Diagnosis not present

## 2020-06-29 DIAGNOSIS — R5383 Other fatigue: Secondary | ICD-10-CM | POA: Diagnosis not present

## 2020-06-29 MED ORDER — ARMODAFINIL 200 MG PO TABS: 200.0000 mg | ORAL_TABLET | Freq: Every day | ORAL | Status: DC

## 2020-06-29 NOTE — Progress Notes (Signed)
Subjective:    Patient ID: Tony Campos, male    DOB: Feb 26, 1966, 54 y.o.   MRN: [id number]  When I last saw the patient, he was losing substantial weight due to gastrointestinal issues.  Patient then independently discontinued his BiPAP due to weight loss because he was no longer "making unusual sounds when he was sleeping."  His wife denied any apneic episodes.  He felt better.  However as his stomach issues have improved, the patient has gained the weight back.  He now reports feeling extremely sleepy all day long.  He states that he wakes up, stays awake for approximately 30 minutes, and then can easily go back to sleep.  He feels like he could fall asleep right now.  He feels very sleepy while driving, after eating, performing daily tasks.  He is on numerous medications prescribed by his pain medication specialist as well as a psychiatrist that potentially could cause sedation including Latuda, Klonopin, morphine immediate release which he uses sparingly, fentanyl 50 mcg every 72 hours, baclofen.  He recently stopped gabapentin and added baclofen around the same time he started feeling very sleepy.  He is using the baclofen 3 times a day.  He also has a history of hypogonadism.  He is no longer on testosterone. Past Medical History:  Diagnosis Date  . AAA (abdominal aortic aneurysm) without rupture (HCC)    3.4 cm found on mri 2/20  . Acid reflux   . Anxiety   . Aortic atherosclerosis ([place])   . Depression   . Diverticulitis   . Genital warts   . Hyperlipidemia   . Hypertension   . Hypothyroidism   . Meningitis   . PAD (peripheral artery disease) ([place])   . SIADH (syndrome of inappropriate ADH production) ([place])   . Sleep apnea 12/2015   uses bipap  . Smoker   . Smoker   . Spondylosis   . Testosterone deficiency    Past Surgical History:  Procedure Laterality Date  . CHOLECYSTECTOMY  2007  . CLOSED REDUCTION MANDIBLE N/A 03/09/2018   Procedure: CLOSED REDUCTION MANDIBULAR MMF;   Surgeon: Jerrell Belfast, MD;  Location: [place];  Service: ENT;  Laterality: N/A;  . KNEE SURGERY     right  . LAPAROSCOPIC APPENDECTOMY N/A 07/25/2018   Procedure: APPENDECTOMY LAPAROSCOPIC;  Surgeon: Aviva Signs, MD;  Location: [facility];  Service: General;  Laterality: N/A;  . MANDIBULAR HARDWARE REMOVAL N/A 05/06/2018   Procedure: REMOVAL OF MMF HARDWARE;  Surgeon: Jerrell Belfast, MD;  Location: [place];  Service: ENT;  Laterality: N/A;  . NASAL SEPTUM SURGERY    . NASAL SINUS SURGERY    . right sfa recanalization and stenting      [facility] 12/2015  . WRIST SURGERY     right and left   Current Outpatient Medications on File Prior to Visit  Medication Sig Dispense Refill  . amphetamine-dextroamphetamine (ADDERALL XR) 20 MG 24 hr capsule     . aspirin EC 81 MG tablet Take 81 mg by mouth daily.    Marland Kitchen b complex vitamins tablet Take by mouth.    . baclofen (LIORESAL) 10 MG tablet Take 10 mg by mouth 3 (three) times daily.    . clonazePAM (KLONOPIN) 1 MG tablet Take 1 mg by mouth 2 (two) times daily.     . colestipol (COLESTID) 1 g tablet TAKE (1) TABLET TWICE DAILY. 60 tablet 0  . DULoxetine (CYMBALTA) 30 MG capsule Take 30 mg by mouth  daily.    . fentaNYL (DURAGESIC - DOSED MCG/HR) 50 MCG/HR Place 50 mcg onto the skin every 3 (three) days.   0  . furosemide (LASIX) 40 MG tablet TAKE 1 TABLET BY MOUTH TWICE DAILY AS NEEDED. (Patient taking differently: as needed. TAKE 1 TABLET BY MOUTH TWICE DAILY AS NEEDED.) 60 tablet 0  . lamoTRIgine (LAMICTAL) 200 MG tablet Take 200 mg by mouth 2 (two) times daily.     Marland Kitchen levothyroxine (SYNTHROID) 125 MCG tablet Take 1 tablet (125 mcg total) by mouth daily before breakfast. 90 tablet 3  . losartan (COZAAR) 50 MG tablet Take 1 tablet (50 mg total) by mouth daily. 30 tablet 0  . lurasidone (LATUDA) 40 MG TABS tablet Take 40 mg by mouth daily with breakfast.    . metoprolol tartrate (LOPRESSOR) 25 MG tablet TAKE (1) TABLET TWICE DAILY. 60  tablet 1  . morphine (MSIR) 15 MG tablet Take 15 mg by mouth 2 (two) times daily.     . naloxegol oxalate (MOVANTIK) 25 MG TABS tablet Take 1 tablet (25 mg total) by mouth daily. 90 tablet 3  . Omega-3 1000 MG CAPS Take by mouth.    . pantoprazole (PROTONIX) 40 MG tablet TAKE (1) TABLET TWICE DAILY. 180 tablet 3  . potassium chloride (K-DUR) 10 MEQ tablet Take by mouth.    . rosuvastatin (CRESTOR) 40 MG tablet Take 1 tablet (40 mg total) by mouth daily. 90 tablet 3  . valACYclovir (VALTREX) 500 MG tablet TAKE 1 TABLET BY MOUTH ONCE DAILY. 30 tablet 5  . [DISCONTINUED] esomeprazole (NEXIUM) 40 MG capsule Take 40 mg by mouth daily before breakfast.       No current facility-administered medications on file prior to visit.    No Active Allergies Social History   Socioeconomic History  . Marital status: Married    Spouse name: Not on file  . Number of children: Not on file  . Years of education: Not on file  . Highest education level: Not on file  Occupational History  . 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 Smoking status: Current Every Day Smoker    Packs/day: 1.00    Types: Cigarettes  . Smokeless tobacco: Never Used  Vaping Use  . Vaping Use: Some days  Substance and Sexual Activity  . Alcohol use: No  . Drug use: No  . Sexual activity: Not on file  Other Topics Concern  . Not on file  Social History Narrative  . Not on file   Social Determinants of Health   Financial Resource Strain:   . Difficulty of Paying Living Expenses:   Food Insecurity:   . Worried About Charity fundraiser in the Last Year:   . Arboriculturist in the Last Year:   Transportation Needs:   . Film/video editor (Medical):   Marland Kitchen Lack of Transportation (Non-Medical):   Physical Activity:   . Days of Exercise per Week:   . Minutes of Exercise per Session:   Stress:   . Feeling of Stress :   Social Connections:   . Frequency of Communication with Friends and  Family:   . Frequency of Social Gatherings with Friends and Family:   . Attends Religious Services:   . Active Member of Clubs or Organizations:   . Attends Archivist Meetings:   Marland Kitchen Marital Status:   Intimate Partner Violence:   . Fear of Current or Ex-Partner:   .  Emotionally Abused:   Marland Kitchen Physically Abused:   . Sexually Abused:       Review of Systems  HENT: Positive for ear pain.   All other systems reviewed and are negative.      Objective:   Physical Exam Vitals reviewed.  Constitutional:      General: He is not in acute distress.    Appearance: Normal appearance. He is not ill-appearing or toxic-appearing.  Cardiovascular:     Rate and Rhythm: Normal rate and regular rhythm.  Pulmonary:     Effort: Pulmonary effort is normal.     Breath sounds: Normal breath sounds.  Neurological:     Mental Status: He is alert.         Assessment & Plan:  Chronic fatigue - Plan: Vitamin B12, TSH, CBC with Differential/Platelet, COMPLETE METABOLIC PANEL WITH GFR, Testosterone Total,Free,Bio, Males  OSA (obstructive sleep apnea)  Hypersomnolence  Polypharmacy  I believe his fatigue and hypersomnolence are due to multiple contributing factors including untreated obstructive sleep apnea, hypogonadism, polypharmacy, and perhaps even narcolepsy.  I will evaluate for hypogonadism checking a testosterone level.  I will check a TSH along with a B12 as well as a CBC and a CMP.  I have recommended the patient contact his pain specialist and temporarily trying to discontinue his baclofen since the hypersomnolence began roughly around the same time.  I will refer the patient back to a sleep specialist as I believe he would benefit from a sleep study to determine the severity of his obstructive sleep apnea and if still present to resume therapy.  He may also benefit from a modified sleep study to evaluate for narcolepsy.

## 2020-06-30 LAB — CBC WITH DIFFERENTIAL/PLATELET
Absolute Monocytes: 1118 cells/uL — ABNORMAL HIGH (ref 200–950)
Basophils Absolute: 79 cells/uL (ref 0–200)
Basophils Relative: 0.9 %
Eosinophils Absolute: 185 cells/uL (ref 15–500)
Eosinophils Relative: 2.1 %
HCT: 36.1 % — ABNORMAL LOW (ref 38.5–50.0)
Hemoglobin: 11.9 g/dL — ABNORMAL LOW (ref 13.2–17.1)
Lymphs Abs: 2174 cells/uL (ref 850–3900)
MCH: 29.6 pg (ref 27.0–33.0)
MCHC: 33 g/dL (ref 32.0–36.0)
MCV: 89.8 fL (ref 80.0–100.0)
MPV: 9.4 fL (ref 7.5–12.5)
Monocytes Relative: 12.7 %
Neutro Abs: 5245 cells/uL (ref 1500–7800)
Neutrophils Relative %: 59.6 %
Platelets: 203 10*3/uL (ref 140–400)
RBC: 4.02 10*6/uL — ABNORMAL LOW (ref 4.20–5.80)
RDW: 13.1 % (ref 11.0–15.0)
Total Lymphocyte: 24.7 %
WBC: 8.8 10*3/uL (ref 3.8–10.8)

## 2020-06-30 LAB — COMPLETE METABOLIC PANEL WITH GFR
AG Ratio: 2.4 (calc) (ref 1.0–2.5)
ALT: 6 U/L — ABNORMAL LOW (ref 9–46)
AST: 14 U/L (ref 10–35)
Albumin: 4.3 g/dL (ref 3.6–5.1)
Alkaline phosphatase (APISO): 77 U/L (ref 35–144)
BUN: 7 mg/dL (ref 7–25)
CO2: 33 mmol/L — ABNORMAL HIGH (ref 20–32)
Calcium: 10 mg/dL (ref 8.6–10.3)
Chloride: 103 mmol/L (ref 98–110)
Creat: 0.93 mg/dL (ref 0.70–1.33)
GFR, Est African American: 110 mL/min/{1.73_m2} (ref >=60)
GFR, Est Non African American: 95 mL/min/{1.73_m2} (ref >=60)
Globulin: 1.8 g/dL (calc) — ABNORMAL LOW (ref 1.9–3.7)
Glucose, Bld: 49 mg/dL — ABNORMAL LOW (ref 65–99)
Potassium: 4.3 mmol/L (ref 3.5–5.3)
Sodium: 143 mmol/L (ref 135–146)
Total Bilirubin: 0.4 mg/dL (ref 0.2–1.2)
Total Protein: 6.1 g/dL (ref 6.1–8.1)

## 2020-06-30 LAB — TESTOSTERONE TOTAL,FREE,BIO, MALES
Albumin: 4.3 g/dL (ref 3.6–5.1)
Sex Hormone Binding: 28 nmol/L (ref 10–50)
Testosterone: 140 ng/dL — ABNORMAL LOW (ref 250–827)

## 2020-06-30 LAB — VITAMIN B12: Vitamin B-12: 490 pg/mL (ref 200–1100)

## 2020-06-30 LAB — TSH: TSH: 0.25 mIU/L — ABNORMAL LOW (ref 0.40–4.50)

## 2020-07-01 ENCOUNTER — Telehealth: Payer: Self-pay | Admitting: Family Medicine

## 2020-07-01 NOTE — Telephone Encounter (Signed)
CB# [phone] Pt had message to call back about his lab results

## 2020-07-02 ENCOUNTER — Other Ambulatory Visit: Payer: Medicare HMO

## 2020-07-02 ENCOUNTER — Other Ambulatory Visit: Payer: Self-pay

## 2020-07-02 DIAGNOSIS — D649 Anemia, unspecified: Secondary | ICD-10-CM

## 2020-07-03 LAB — IRON,TIBC AND FERRITIN PANEL
%SAT: 20 % (calc) (ref 20–48)
Ferritin: 133 ng/mL (ref 38–380)
Iron: 64 ug/dL (ref 50–180)
TIBC: 318 mcg/dL (calc) (ref 250–425)

## 2020-07-04 DIAGNOSIS — D649 Anemia, unspecified: Secondary | ICD-10-CM | POA: Diagnosis not present

## 2020-07-05 ENCOUNTER — Other Ambulatory Visit: Payer: Self-pay

## 2020-07-05 ENCOUNTER — Other Ambulatory Visit: Payer: Medicare HMO

## 2020-07-05 DIAGNOSIS — D649 Anemia, unspecified: Secondary | ICD-10-CM

## 2020-07-05 NOTE — Telephone Encounter (Signed)
Please see lab results for further information.  

## 2020-07-06 ENCOUNTER — Encounter: Payer: Self-pay | Admitting: Family Medicine

## 2020-07-06 DIAGNOSIS — D649 Anemia, unspecified: Secondary | ICD-10-CM

## 2020-07-06 DIAGNOSIS — K5732 Diverticulitis of large intestine without perforation or abscess without bleeding: Secondary | ICD-10-CM

## 2020-07-06 DIAGNOSIS — R195 Other fecal abnormalities: Secondary | ICD-10-CM | POA: Insufficient documentation

## 2020-07-06 LAB — FECAL GLOBIN BY IMMUNOCHEMISTRY
FECAL GLOBIN RESULT:: DETECTED — AB
MICRO NUMBER:: 10803326
SPECIMEN QUALITY:: ADEQUATE

## 2020-07-09 ENCOUNTER — Other Ambulatory Visit: Payer: Self-pay | Admitting: Family Medicine

## 2020-07-09 DIAGNOSIS — R000 Tachycardia, unspecified: Secondary | ICD-10-CM

## 2020-07-13 DIAGNOSIS — F4312 Post-traumatic stress disorder, chronic: Secondary | ICD-10-CM | POA: Diagnosis not present

## 2020-07-13 DIAGNOSIS — G4730 Sleep apnea, unspecified: Secondary | ICD-10-CM | POA: Diagnosis not present

## 2020-07-13 DIAGNOSIS — R69 Illness, unspecified: Secondary | ICD-10-CM | POA: Diagnosis not present

## 2020-07-13 DIAGNOSIS — G47419 Narcolepsy without cataplexy: Secondary | ICD-10-CM | POA: Diagnosis not present

## 2020-07-14 ENCOUNTER — Ambulatory Visit: Payer: Medicare HMO | Admitting: Gastroenterology

## 2020-07-14 ENCOUNTER — Encounter: Payer: Self-pay | Admitting: Gastroenterology

## 2020-07-14 VITALS — BP 136/84 | HR 70 | Ht 72.0 in | Wt 233.0 lb

## 2020-07-14 DIAGNOSIS — D649 Anemia, unspecified: Secondary | ICD-10-CM

## 2020-07-14 DIAGNOSIS — K921 Melena: Secondary | ICD-10-CM | POA: Diagnosis not present

## 2020-07-14 MED ORDER — NA SULFATE-K SULFATE-MG SULF 17.5-3.13-1.6 GM/177ML PO SOLN: 1.0000 | Freq: Once | ORAL | 0 refills | Status: AC

## 2020-07-14 NOTE — Patient Instructions (Addendum)
I have recommended an EGD and colonoscopy for further evaluation of your anemia and blood in the stool.   In addition to the regular prep, I recommend that you take Miralax 17 grams twice daily for the 3 days leading up to the colonoscopy.  We always have a doctor on call. Please call at any time if you don't think that your prep is working.   If you are age 52 or older, your body mass index should be between 23-30. Your Body mass index is 31.6 kg/m. If this is out of the aforementioned range listed, please consider follow up with your Primary Care Provider.  If you are age 8 or younger, your body mass index should be between 19-25. Your Body mass index is 31.6 kg/m. If this is out of the aformentioned range listed, please consider follow up with your Primary Care Provider.

## 2020-07-14 NOTE — Progress Notes (Signed)
Referring Provider: Susy Frizzle, MD Primary Care Physician:  Susy Frizzle, MD   Chief complaint:  FIT+   IMPRESSION:  FIT + 07/04/20 Normocytic anemia without iron deficiency or overt bleeding Longstanding history of Intermittent diarrhea   - Fecal calprotectin 50, ESR 6, CRP elevated 7.260   - duodenal biopsies and random colon biopsies were normal Sigmoid diverticulitis with known sigmoid diverticulosis    - initially diagnosed by CT 05/24/18: distal sigmoid and recturm    - given 10 days followed by an additional 10 days of Cipro 500 BID and Flagyly 500 BID    - Augmentin 875 mg TID x 10 days for ongoing pain    - CT 07/25/18: 10 mm appendix, consistent edematous thickening of the rectosigmoid junction       - ? 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 EGD and repeat colonoscopy recommended for further evaluation.    PLAN: Colonoscopy and EGD - Miralax 17 g BID for the 3 days prior to your proceudre   HPI: Tony Campos is a 54 y.o. male seen for FIT+ test and anemia.  He was initially seen by PA Lemmon 10/15/2018 for diarrhea, had a colonoscopy 10/22/2018, and a subsequent office visit with me 11/08/2018.  He is now rereferred for FIT+ test.  He has completed the Covid vaccine.   Evaluation of his LLQ pain and  explosive, watery diarrhea having up to 10 BM daily included: - CT abd/pelvis 05/24/18: sigmoid  diverticulitis, inflammatory changes in the distal sigmoid and rectum - CT abd/pelvis with contrast 07/25/18:  Enlarged appendix, persistent edema of the rectosigmoid colon, AAA - Colonoscopy 10/22/2018 showed a 3 mm descending colon polyp.  Granular appearing mucosa from 20 to 30 cm and mild erythema in the distal terminal ileum.Marland Kitchen Biopsies at the time of endoscopy showed normal terminal ileum and normal pancolonic biopsies.  A tubular adenoma was removed from the descending colon. He denies the use of any NSAIDs.  Labs 10/22/18: Fecal calprotectin 50.  ESR 6. CRP elevated 7.260 Labs 12/18/18: Giardia antigen negative Follow-up CT abd/pelvis with contrast 02/13/19: diffuse stool throughout the colon, AAA Labs 12/07/19: CRP 0.1, ESR 4  Symptoms recurred after completing the initial course of antibiotics for diverticulitis. He was treated with Cipro 500 mg BID, Metronidazole 500 mg TID, mesalamine 800 mg TID x 1 month for possible SCAD with some improvement in his symptoms.  He continues to have episodes of generalized abdominal pain with associated watery diarrhea. 4-5 BM daily. Fecal soiling that occurs unexpectedly.  Alternating normal bowel habits. Episodes are shorter when on antibiotics.  No blood or mucous. No extra-GI manifestations of IBD. No identified exacerbating or relieving features.   Over the last several months he has had progressive fatigue. In the evaluation for fatigue labs showed a hemoglobin of 11.9, MCV 89.8, RDW 13.1, platelets 203, iron 64, ferritin 133, percent saturation 20. His baseline hemoglobin has been  12.1-12.7 over the last 18 months. His hemoglobin was normal at 13.9 in 2018.  FIT test was positive 07/04/20.   Past Medical History:  Diagnosis Date  . AAA (abdominal aortic aneurysm) without rupture (HCC)    3.4 cm found on mri 2/20  . Acid reflux   . Anxiety   . Aortic atherosclerosis ([place])   . Depression   . Diverticulitis   . Genital warts   . Hyperlipidemia   .  Hypertension   . Hypothyroidism   . Meningitis   . PAD (peripheral artery disease) ([place])   . SIADH (syndrome of inappropriate ADH production) ([place])   . Sleep apnea 12/2015   uses bipap  . Smoker   . Smoker   . Spondylosis   . Testosterone deficiency     Past Surgical History:  Procedure Laterality Date  . APPENDECTOMY  12/2018   at [facility]  . CHOLECYSTECTOMY  2007  . CLOSED REDUCTION MANDIBLE N/A 03/09/2018   Procedure: CLOSED REDUCTION MANDIBULAR MMF;  Surgeon: Jerrell Belfast, MD;  Location: [place];  Service: ENT;  Laterality: N/A;  . KNEE SURGERY     right  . LAPAROSCOPIC APPENDECTOMY N/A 07/25/2018   Procedure: APPENDECTOMY LAPAROSCOPIC;  Surgeon: Aviva Signs, MD;  Location: [facility];  Service: General;  Laterality: N/A;  . MANDIBULAR HARDWARE REMOVAL N/A 05/06/2018   Procedure: REMOVAL OF MMF HARDWARE;  Surgeon: Jerrell Belfast, MD;  Location: [place];  Service: ENT;  Laterality: N/A;  . NASAL SEPTUM SURGERY    . NASAL SINUS SURGERY    . right sfa recanalization and stenting      [facility] 12/2015  . WRIST SURGERY     right and left    Current Outpatient Medications  Medication Sig Dispense Refill  . amphetamine-dextroamphetamine (ADDERALL XR) 20 MG 24 hr capsule     . Armodafinil (NUVIGIL) 200 MG TABS Take 200 mg by mouth daily. 30 tablet   . aspirin EC 81 MG tablet Take 81 mg by mouth daily.    Marland Kitchen b complex vitamins tablet Take by mouth.    . baclofen (LIORESAL) 10 MG tablet Take 10 mg by mouth 3 (three) times daily.    . clonazePAM (KLONOPIN) 1 MG tablet Take 1 mg by mouth 2 (two) times daily.     . colestipol (COLESTID) 1 g tablet TAKE (1) TABLET TWICE DAILY. 60 tablet 0  . DULoxetine (CYMBALTA) 60 MG capsule Take 60 mg by mouth daily.    . fentaNYL (DURAGESIC - DOSED MCG/HR) 50 MCG/HR Place 50 mcg onto the skin every 3 (three) days.   0  . furosemide (LASIX) 40 MG tablet TAKE 1 TABLET BY MOUTH TWICE DAILY AS NEEDED. (Patient taking differently: as  needed. TAKE 1 TABLET BY MOUTH TWICE DAILY AS NEEDED.) 60 tablet 0  . gabapentin (NEURONTIN) 300 MG capsule Take 1 capsule by mouth 4 (four) times daily.    Marland Kitchen lamoTRIgine (LAMICTAL) 200 MG tablet Take 200 mg by mouth 2 (two) times daily.     Marland Kitchen levothyroxine (SYNTHROID) 125 MCG tablet Take 1 tablet (125 mcg total) by mouth daily before breakfast. 90 tablet 3  . losartan (COZAAR) 50 MG tablet Take 1 tablet (50 mg total) by mouth daily. 30 tablet 0  . lurasidone (LATUDA) 40 MG TABS tablet Take 40 mg by mouth daily with breakfast.    . metoprolol tartrate (LOPRESSOR) 25 MG tablet TAKE (1) TABLET TWICE DAILY. 60 tablet 0  . morphine (MSIR) 15 MG  tablet Take 15 mg by mouth 2 (two) times daily.     . naloxegol oxalate (MOVANTIK) 25 MG TABS tablet Take 1 tablet (25 mg total) by mouth daily. 90 tablet 3  . Omega-3 1000 MG CAPS Take by mouth.    . pantoprazole (PROTONIX) 40 MG tablet TAKE (1) TABLET TWICE DAILY. 180 tablet 3  . potassium chloride (K-DUR) 10 MEQ tablet Take by mouth.    . rosuvastatin (CRESTOR) 40 MG tablet Take 1 tablet (40 mg total) by mouth daily. 90 tablet 3  . valACYclovir (VALTREX) 500 MG tablet TAKE 1 TABLET BY MOUTH ONCE DAILY. 30 tablet 5   No current facility-administered medications for this visit.    Allergies as of 07/14/2020  . (No Known Allergies)    Family History  Problem Relation Age of Onset  . Other Father        killed in [place]  . Cancer Maternal Grandfather        stomach  . Stomach cancer Maternal Grandfather   . Colon cancer Neg Hx   . Colitis Neg Hx   . Esophageal cancer Neg Hx   . Rectal cancer Neg Hx     Social History   Socioeconomic History  . Marital status: Married    Spouse name: Not on file  . Number of children: 0  . Years of education: Not on file  . Highest education level: Not on file  Occupational History  . Not on file  Tobacco Use  . Smoking status: Former Smoker    Packs/day: 1.00    Types: Cigarettes  . Smokeless  tobacco: Never Used  Vaping Use  . Vaping Use: Some days  Substance and Sexual Activity  . Alcohol use: No  . Drug use: No  . Sexual activity: Not on file  Other Topics Concern  . Not on file  Social History Narrative  . Not on file   Social Determinants of Health   Financial Resource Strain:   . Difficulty of Paying Living Expenses:   Food Insecurity:   . Worried About Charity fundraiser in the Last Year:   . Arboriculturist in the Last Year:   Transportation Needs:   . Film/video editor (Medical):   Marland Kitchen Lack of Transportation (Non-Medical):   Physical Activity:   . Days of Exercise per Week:   . Minutes of Exercise per Session:   Stress:   . Feeling of Stress :   Social Connections:   . Frequency of Communication with Friends and Family:   . Frequency of Social Gatherings with Friends and Family:   . Attends Religious Services:   . Active Member of Clubs or Organizations:   . Attends Archivist Meetings:   Marland Kitchen Marital Status:   Intimate Partner Violence:   . Fear of Current or Ex-Partner:   . Emotionally Abused:   Marland Kitchen Physically Abused:   . Sexually Abused:     Physical Exam: Vital signs were reviewed. General:   Alert, well-nourished, pleasant and cooperative in NAD Head:  Normocephalic and atraumatic. Eyes:  Sclera clear, no icterus.   Conjunctiva pink. Mouth:  No deformity or lesions.   Neck:  Supple; no thyromegaly. Lungs:  Clear throughout to auscultation.   No wheezes. Heart:  Regular rate and rhythm; no murmurs Abdomen:  Soft, nontender, normal bowel sounds. No rebound or guarding. No hepatosplenomegaly Rectal:  Deferred  Msk:  Symmetrical without gross deformities. Extremities:  No gross deformities or  edema. Neurologic:  Alert and  oriented x4;  grossly nonfocal Skin:  No rash or bruise. Psych:  Alert and cooperative. Normal mood and affect.   Cashae Weich L. Tarri Glenn, MD, MPH [facility] 07/14/2020, 10:49 AM

## 2020-07-20 ENCOUNTER — Encounter: Payer: Self-pay | Admitting: Gastroenterology

## 2020-07-26 ENCOUNTER — Other Ambulatory Visit: Payer: Self-pay

## 2020-07-26 ENCOUNTER — Ambulatory Visit (AMBULATORY_SURGERY_CENTER): Payer: Medicare HMO | Admitting: Gastroenterology

## 2020-07-26 ENCOUNTER — Telehealth: Payer: Self-pay | Admitting: Gastroenterology

## 2020-07-26 ENCOUNTER — Encounter: Payer: Self-pay | Admitting: Gastroenterology

## 2020-07-26 VITALS — BP 100/48 | HR 83 | Temp 96.6°F | Resp 12 | Ht 72.0 in | Wt 233.0 lb

## 2020-07-26 DIAGNOSIS — K225 Diverticulum of esophagus, acquired: Secondary | ICD-10-CM | POA: Diagnosis not present

## 2020-07-26 DIAGNOSIS — D649 Anemia, unspecified: Secondary | ICD-10-CM | POA: Diagnosis not present

## 2020-07-26 DIAGNOSIS — R197 Diarrhea, unspecified: Secondary | ICD-10-CM

## 2020-07-26 DIAGNOSIS — K2970 Gastritis, unspecified, without bleeding: Secondary | ICD-10-CM | POA: Diagnosis not present

## 2020-07-26 DIAGNOSIS — K921 Melena: Secondary | ICD-10-CM | POA: Diagnosis not present

## 2020-07-26 DIAGNOSIS — D124 Benign neoplasm of descending colon: Secondary | ICD-10-CM | POA: Diagnosis not present

## 2020-07-26 DIAGNOSIS — K449 Diaphragmatic hernia without obstruction or gangrene: Secondary | ICD-10-CM

## 2020-07-26 DIAGNOSIS — K3189 Other diseases of stomach and duodenum: Secondary | ICD-10-CM | POA: Diagnosis not present

## 2020-07-26 DIAGNOSIS — R195 Other fecal abnormalities: Secondary | ICD-10-CM | POA: Diagnosis not present

## 2020-07-26 HISTORY — PX: COLONOSCOPY: SHX174

## 2020-07-26 HISTORY — PX: UPPER GASTROINTESTINAL ENDOSCOPY: SHX188

## 2020-07-26 MED ORDER — PANTOPRAZOLE SODIUM 40 MG PO TBEC: 40.0000 mg | DELAYED_RELEASE_TABLET | Freq: Two times a day (BID) | ORAL | 3 refills | Status: DC

## 2020-07-26 MED ORDER — SODIUM CHLORIDE 0.9 % IV SOLN: 500.0000 mL | Freq: Once | INTRAVENOUS | Status: DC

## 2020-07-26 MED ORDER — LOSARTAN POTASSIUM 50 MG PO TABS
50.0000 mg | ORAL_TABLET | Freq: Every day | ORAL | 3 refills | Status: DC
Start: 2020-07-26 — End: 2020-07-28

## 2020-07-26 NOTE — Telephone Encounter (Signed)
Pt has a procedure scheduled for today @3 :30pm but he states yesterday after taking the medication at 6pm he didn't use the bathroom until 3:30am.

## 2020-07-26 NOTE — Progress Notes (Signed)
A/ox3, pleased with MAC, report to RN 

## 2020-07-26 NOTE — Telephone Encounter (Signed)
Returned pts call.  He states that he drank prep last night and did not move bowels until 330am.  He went 2 more times after that.  He started the 2nd prep early and has finished that.  He has moved his bowels several times since then and describes his stools as dark brown liquid.  I advised him to drank an additional 16 ozs of water now and I would consult with Dr. Tarri Glenn.  Please advise any additional instructions.

## 2020-07-26 NOTE — Telephone Encounter (Signed)
I recommend magnesium citrate - one 10.0 ounce bottle now. Thank you.

## 2020-07-26 NOTE — Op Note (Addendum)
[place] Patient Name: Tony Campos Procedure Date: 07/26/2020 2:30 PM MRN: [id number] Endoscopist: Thornton Park MD, MD Age: 54 Referring MD:  Date of Birth: November 07, 1966 Gender: Male Account #: [id number] Procedure:                Upper GI endoscopy Indications:              Unexplained iron deficiency anemia, Diarrhea Medicines:                Monitored Anesthesia Care Procedure:                Pre-Anesthesia Assessment:                           - Prior to the procedure, a History and Physical                            was performed, and patient medications and                            allergies were reviewed. The patient's tolerance of                            previous anesthesia was also reviewed. The risks                            and benefits of the procedure and the sedation                            options and risks were discussed with the patient.                            All questions were answered, and informed consent                            was obtained. Prior Anticoagulants: The patient has                            taken no previous anticoagulant or antiplatelet                            agents. ASA Grade Assessment: III - A patient with                            severe systemic disease. After reviewing the risks                            and benefits, the patient was deemed in                            satisfactory condition to undergo the procedure.                           After obtaining informed consent, the endoscope was  passed under direct vision. Throughout the                            procedure, the patient's blood pressure, pulse, and                            oxygen saturations were monitored continuously. The                            Endoscope was introduced through the mouth, and                            advanced to the third part of duodenum. The upper                            GI  endoscopy was accomplished without difficulty.                            The patient tolerated the procedure well. Scope In: Scope Out: Findings:                 The examined esophagus was normal.                           There was 1058mL of clear liquid in the stomach                            that was easily aspirated. Diffuse mild                            inflammation characterized by erythema, friability                            and granularity was found in the gastric body.                            Biopsies were taken from the antrum, body, and                            fundus with a cold forceps for histology. Estimated                            blood loss was minimal.                           The examined duodenum was normal. Biopsies were                            taken with a cold forceps for histology. Estimated                            blood loss was minimal.                           A small hiatal  hernia was present.                           A medium non-bleeding diverticulum was found in the                            second portion of the duodenum.                           The cardia and gastric fundus were normal on                            retroflexion.                           The exam was otherwise without abnormality. Complications:            No immediate complications. Estimated blood loss:                            Minimal. Estimated Blood Loss:     Estimated blood loss was minimal. Impression:               - Normal esophagus.                           - Mild gastritis. Biopsied.                           - Normal examined duodenum. Biopsied.                           - Small hiatal hernia.                           - The examination was otherwise normal. Recommendation:           - Patient has a contact number available for                            emergencies. The signs and symptoms of potential                            delayed  complications were discussed with the                            patient. Return to normal activities tomorrow.                            Written discharge instructions were provided to the                            patient.                           - Resume previous diet.                           -  Continue present medications. Increase                            pantoprazole to 40 mg BID x 12 weeks. Then reduce                            dose to once daily.                           - No aspirin, ibuprofen, naproxen, or other                            non-steroidal anti-inflammatory drugs.                           - Await pathology results.                           - Proceed with colonoscopy as previously planned. Thornton Park MD, MD 07/26/2020 3:51:15 PM This report has been signed electronically.

## 2020-07-26 NOTE — Progress Notes (Signed)
V/S-AG  Bilateral tatoo's forearm

## 2020-07-26 NOTE — Progress Notes (Signed)
Called to room to assist during endoscopic procedure.  Patient ID and intended procedure confirmed with present staff. Received instructions for my participation in the procedure from the performing physician.  

## 2020-07-26 NOTE — Patient Instructions (Signed)
Handouts given:  Hiatal hernia, polyps, gastritis Resume previous diet Continue present medications Increase pantoprazole to 40mg  twice daily with 8 oz water and 30 minutes prior to meals. Do this for 12 weeks then reduce dose to once daily. No aspirin, ibuprofen, naproxen, ALEVE Or other NSAIDS AWAIT PATHOLOGY RESULTS   YOU HAD AN ENDOSCOPIC PROCEDURE TODAY AT [facility]:   Refer to the procedure report that was given to you for any specific questions about what was found during the examination.  If the procedure report does not answer your questions, please call your gastroenterologist to clarify.  If you requested that your care partner not be given the details of your procedure findings, then the procedure report has been included in a sealed envelope for you to review at your convenience later.  YOU SHOULD EXPECT: Some feelings of bloating in the abdomen. Passage of more gas than usual.  Walking can help get rid of the air that was put into your GI tract during the procedure and reduce the bloating. If you had a lower endoscopy (such as a colonoscopy or flexible sigmoidoscopy) you may notice spotting of blood in your stool or on the toilet paper. If you underwent a bowel prep for your procedure, you may not have a normal bowel movement for a few days.  Please Note:  You might notice some irritation and congestion in your nose or some drainage.  This is from the oxygen used during your procedure.  There is no need for concern and it should clear up in a day or so.  SYMPTOMS TO REPORT IMMEDIATELY:   Following lower endoscopy (colonoscopy or flexible sigmoidoscopy):  Excessive amounts of blood in the stool  Significant tenderness or worsening of abdominal pains  Swelling of the abdomen that is new, acute  Fever of 100F or higher   Following upper endoscopy (EGD)  Vomiting of blood or coffee ground material  New chest pain or pain under the shoulder blades  Painful or  persistently difficult swallowing  New shortness of breath  Fever of 100F or higher  Black, tarry-looking stools  For urgent or emergent issues, a gastroenterologist can be reached at any hour by calling [phone]. Do not use MyChart messaging for urgent concerns.    DIET:  We do recommend a small meal at first, but then you may proceed to your regular diet.  Drink plenty of fluids but you should avoid alcoholic beverages for 24 hours.  ACTIVITY:  You should plan to take it easy for the rest of today and you should NOT DRIVE or use heavy machinery until tomorrow (because of the sedation medicines used during the test).    FOLLOW UP: Our staff will call the number listed on your records 48-72 hours following your procedure to check on you and address any questions or concerns that you may have regarding the information given to you following your procedure. If we do not reach you, we will leave a message.  We will attempt to reach you two times.  During this call, we will ask if you have developed any symptoms of COVID 19. If you develop any symptoms (ie: fever, flu-like symptoms, shortness of breath, cough etc.) before then, please call [phone].  If you test positive for Covid 19 in the 2 weeks post procedure, please call and report this information to [place].    If any biopsies were taken you will be contacted by phone or by letter within the next 1-3  weeks.  Please call us at [phone] if you have not heard about the biopsies in 3 weeks.    SIGNATURES/CONFIDENTIALITY: You and/or your care partner have signed paperwork which will be entered into your electronic medical record.  These signatures attest to the fact that that the information above on your After Visit Summary has been reviewed and is understood.  Full responsibility of the confidentiality of this discharge information lies with you and/or your care-partner.

## 2020-07-26 NOTE — Op Note (Signed)
[place] Patient Name: Tony Campos Procedure Date: 07/26/2020 2:30 PM MRN: [id number] Endoscopist: Thornton Park MD, MD Age: 54 Referring MD:  Date of Birth: [birth year] Gender: Male Account #: [id number] Procedure:                Colonoscopy Indications:              Positive fecal immunochemical test Medicines:                Monitored Anesthesia Care Procedure:                Pre-Anesthesia Assessment:                           - Prior to the procedure, a History and Physical                            was performed, and patient medications and                            allergies were reviewed. The patient's tolerance of                            previous anesthesia was also reviewed. The risks                            and benefits of the procedure and the sedation                            options and risks were discussed with the patient.                            All questions were answered, and informed consent                            was obtained. Prior Anticoagulants: The patient has                            taken no previous anticoagulant or antiplatelet                            agents. ASA Grade Assessment: III - A patient with                            severe systemic disease. After reviewing the risks                            and benefits, the patient was deemed in                            satisfactory condition to undergo the procedure.                           After obtaining informed consent, the colonoscope  was passed under direct vision. Throughout the                            procedure, the patient's blood pressure, pulse, and                            oxygen saturations were monitored continuously. The                            Colonoscope was introduced through the anus and                            advanced to the 4 cm into the ileum. The                            colonoscopy was performed  without difficulty. The                            patient tolerated the procedure well. The quality                            of the bowel preparation was adequate to identify                            polyps 6 mm and larger in size. Over 1049mL of                            liquid stool were lavaged during the procedure. The                            terminal ileum, ileocecal valve, appendiceal                            orifice, and rectum were photographed. Scope In: 3:13:02 PM Scope Out: 3:40:50 PM Scope Withdrawal Time: 0 hours 20 minutes 6 seconds  Total Procedure Duration: 0 hours 27 minutes 48 seconds  Findings:                 The perianal and digital rectal examinations were                            normal.                           Four sessile polyps were found in the descending                            colon. The polyps were 1 to 2 mm in size. These                            polyps were removed with a cold snare. Resection                            and retrieval were  complete. Estimated blood loss                            was minimal. These polyps were removed with a cold                            snare. Resection and retrieval were complete.                            Estimated blood loss was minimal.                           Non-bleeding internal hemorrhoids were found.                           There was a significant amount of stool left in the                            colon. Over 1023mL of liquid stool were lavaged                            during the procedure. The exam was otherwise                            without abnormality on direct and retroflexion                            views. Complications:            No immediate complications. Estimated blood loss:                            Minimal. Estimated Blood Loss:     Estimated blood loss was minimal. Impression:               - Four 1 to 2 mm polyps in the descending colon,                             removed with a cold snare. Resected and retrieved.                           - Non-bleeding internal hemorrhoids.                           - The examination was otherwise normal on direct                            and retroflexion views. Recommendation:           - Patient has a contact number available for                            emergencies. The signs and symptoms of potential                            delayed complications were discussed  with the                            patient. Return to normal activities tomorrow.                            Written discharge instructions were provided to the                            patient.                           - Resume previous diet.                           - Continue present medications.                           - Await pathology results.                           - Repeat colonoscopy date to be determined after                            pending pathology results are reviewed for                            surveillance.                           - Short interval colonoscopy recommended given the                            prep and the number of polyps identified today.                           - Emerging evidence supports eating a diet of                            fruits, vegetables, grains, calcium, and yogurt                            while reducing red meat and alcohol may reduce the                            risk of colon cancer.                           - Thank you for allowing me to be involved in your                            colon cancer prevention. Thornton Park MD, MD 07/26/2020 3:59:57 PM This report has been signed electronically.

## 2020-07-26 NOTE — Telephone Encounter (Signed)
Phoned pt and advised him to drink 1 bottle of Mag Citrate prior to 12:30 pm. He is to call back with any further questions/concerns.  He verbalized understanding.

## 2020-07-28 ENCOUNTER — Other Ambulatory Visit: Payer: Self-pay

## 2020-07-28 ENCOUNTER — Telehealth: Payer: Self-pay

## 2020-07-28 MED ORDER — LOSARTAN POTASSIUM 50 MG PO TABS
50.0000 mg | ORAL_TABLET | Freq: Every day | ORAL | 3 refills | Status: DC
Start: 2020-07-28 — End: 2020-11-05

## 2020-07-28 NOTE — Telephone Encounter (Signed)
°  Follow up Call-  Call back number 07/26/2020 10/22/2018  Post procedure Call Back phone  # [phone]  Permission to leave phone message Yes Yes  Some recent data might be hidden     Patient questions:  Do you have a fever, pain , or abdominal swelling? No. Pain Score  0 *  Have you tolerated food without any problems? Yes.    Have you been able to return to your normal activities? Yes.    Do you have any questions about your discharge instructions: Diet   No. Medications  No. Follow up visit  No.  Do you have questions or concerns about your Care? No.  Actions: * If pain score is 4 or above: No action needed, pain <4. 1. Have you developed a fever since your procedure? no  2.   Have you had an respiratory symptoms (SOB or cough) since your procedure? no  3.   Have you tested positive for COVID 19 since your procedure no  4.   Have you had any family members/close contacts diagnosed with the COVID 19 since your procedure?  no   If yes to any of these questions please route to Joylene John, RN and Joella Prince, RN

## 2020-08-04 ENCOUNTER — Other Ambulatory Visit: Payer: Self-pay

## 2020-08-04 DIAGNOSIS — D649 Anemia, unspecified: Secondary | ICD-10-CM

## 2020-08-16 ENCOUNTER — Other Ambulatory Visit: Payer: Self-pay

## 2020-08-16 ENCOUNTER — Ambulatory Visit (INDEPENDENT_AMBULATORY_CARE_PROVIDER_SITE_OTHER): Payer: Medicare HMO | Admitting: Family Medicine

## 2020-08-16 VITALS — BP 140/80 | HR 95 | Temp 98.8°F | Ht 72.0 in | Wt 238.0 lb

## 2020-08-16 DIAGNOSIS — R0600 Dyspnea, unspecified: Secondary | ICD-10-CM | POA: Diagnosis not present

## 2020-08-16 DIAGNOSIS — R5382 Chronic fatigue, unspecified: Secondary | ICD-10-CM | POA: Diagnosis not present

## 2020-08-16 DIAGNOSIS — E291 Testicular hypofunction: Secondary | ICD-10-CM | POA: Diagnosis not present

## 2020-08-16 DIAGNOSIS — Z79899 Other long term (current) drug therapy: Secondary | ICD-10-CM | POA: Diagnosis not present

## 2020-08-16 DIAGNOSIS — R0609 Other forms of dyspnea: Secondary | ICD-10-CM

## 2020-08-16 NOTE — Progress Notes (Signed)
Subjective:    Patient ID: Tony Campos, male    DOB: 07-08-1966, 54 y.o.   MRN: [id number] 06/29/20 When I last saw the patient, he was losing substantial weight due to gastrointestinal issues.  Patient then independently discontinued his BiPAP due to weight loss because he was no longer "making unusual sounds when he was sleeping."  His wife denied any apneic episodes.  He felt better.  However as his stomach issues have improved, the patient has gained the weight back.  He now reports feeling extremely sleepy all day long.  He states that he wakes up, stays awake for approximately 30 minutes, and then can easily go back to sleep.  He feels like he could fall asleep right now.  He feels very sleepy while driving, after eating, performing daily tasks.  He is on numerous medications prescribed by his pain medication specialist as well as a psychiatrist that potentially could cause sedation including Latuda, Klonopin, morphine immediate release which he uses sparingly, fentanyl 50 mcg every 72 hours, baclofen.  He recently stopped gabapentin and added baclofen around the same time he started feeling very sleepy.  He is using the baclofen 3 times a day.  He also has a history of hypogonadism.  He is no longer on testosterone.  At that time, my plan was: I believe his fatigue and hypersomnolence are due to multiple contributing factors including untreated obstructive sleep apnea, hypogonadism, polypharmacy, and perhaps even narcolepsy.  I will evaluate for hypogonadism checking a testosterone level.  I will check a TSH along with a B12 as well as a CBC and a CMP.  I have recommended the patient contact his pain specialist and temporarily trying to discontinue his baclofen since the hypersomnolence began roughly around the same time.  I will refer the patient back to a sleep specialist as I believe he would benefit from a sleep study to determine the severity of his obstructive sleep apnea and if still present  to resume therapy.  He may also benefit from a modified sleep study to evaluate for narcolepsy.  08/16/20 Lab work in August revealed mild anemia with a hemoglobin of 11.9. Patient had an EGD that revealed gastritis and his Protonix was increased to twice daily. Colonoscopy revealed several polyps and an inadequate bowel prep. They recommended repeat colonoscopy in 2 years. However no cause for fatigue was identified. Testosterone level was extremely low. However the majority of his symptoms are hypersomnolence and feeling sleepy all day long. I have suggested to the patient that this could be due to polypharmacy. He is prescribed several different medications from a pain specialist along with a psychiatrist that could cause sedation. However the patient states that he has been on these medications for years and he just recently started having the symptoms of feeling tired with no energy. He now also starting to endorse dyspnea on exertion. He states that he becomes easily winded simply walking into my building from the parking lot. He denies any orthopnea. He denies any paroxysmal nocturnal dyspnea. He denies any chest pain. I believe some of the shortness of breath could be due to deconditioning however the patient has a history of peripheral vascular disease and therefore is at risk for coronary artery disease. Past Medical History:  Diagnosis Date  . AAA (abdominal aortic aneurysm) without rupture (HCC)    3.4 cm found on mri 2/20  . Acid reflux   . Anemia   . Anxiety   . Aortic atherosclerosis ([place])   .  Depression   . Diverticulitis   . Genital warts   . Hyperlipidemia   . Hypertension   . Hypothyroidism   . Meningitis   . PAD (peripheral artery disease) ([place])   . SIADH (syndrome of inappropriate ADH production) ([place])   . Sleep apnea 12/2015   uses bipap  . Smoker   . Smoker   . Spondylosis   . Testosterone deficiency    Past Surgical History:  Procedure Laterality Date  .  APPENDECTOMY  12/2018   at [facility]  . CHOLECYSTECTOMY  2007  . CLOSED REDUCTION MANDIBLE N/A 03/09/2018   Procedure: CLOSED REDUCTION MANDIBULAR MMF;  Surgeon: Jerrell Belfast, MD;  Location: [facility];  Service: ENT;  Laterality: N/A;  . COLONOSCOPY  07/26/2020   10/22/2018  . KNEE SURGERY     right  . LAPAROSCOPIC APPENDECTOMY N/A 07/25/2018   Procedure: APPENDECTOMY LAPAROSCOPIC;  Surgeon: Aviva Signs, MD;  Location: [facility];  Service: General;  Laterality: N/A;  . MANDIBULAR HARDWARE REMOVAL N/A 05/06/2018   Procedure: REMOVAL OF MMF HARDWARE;  Surgeon: Jerrell Belfast, MD;  Location: [facility];  Service: ENT;  Laterality: N/A;  . NASAL SEPTUM SURGERY    . NASAL SINUS SURGERY    . POLYPECTOMY    . right sfa recanalization and stenting      [facility] 12/2015  . UPPER GASTROINTESTINAL ENDOSCOPY  07/26/2020  . WRIST SURGERY     right and left   Current Outpatient Medications on File Prior to Visit  Medication Sig Dispense Refill  . amphetamine-dextroamphetamine (ADDERALL XR) 20 MG 24 hr capsule in the morning and at bedtime.     . Armodafinil (NUVIGIL) 200 MG TABS Take 200 mg by mouth daily. 30 tablet   . aspirin EC 81 MG tablet Take 81 mg by mouth daily.    Marland Kitchen b complex vitamins tablet Take by mouth.    . baclofen (LIORESAL) 10 MG tablet Take 10 mg by mouth 3 (three) times daily.    . clonazePAM (KLONOPIN) 1 MG tablet Take 1 mg by mouth 2 (two) times daily.     . colestipol (COLESTID) 1 g tablet TAKE (1) TABLET TWICE DAILY. 60 tablet 0  . DULoxetine (CYMBALTA) 60 MG capsule Take 60 mg by mouth daily.    . fentaNYL (DURAGESIC - DOSED MCG/HR) 50 MCG/HR Place 50 mcg onto the skin every 3 (three) days.   0  . furosemide (LASIX) 40 MG tablet TAKE 1 TABLET BY MOUTH TWICE DAILY AS NEEDED. (Patient taking differently: as needed. TAKE 1 TABLET BY MOUTH TWICE DAILY AS NEEDED.) 60 tablet 0  . gabapentin (NEURONTIN) 300 MG capsule Take 1 capsule by mouth 4 (four) times daily.    Marland Kitchen  lamoTRIgine (LAMICTAL) 200 MG tablet Take 200 mg by mouth 2 (two) times daily.    Marland Kitchen levothyroxine (SYNTHROID) 125 MCG tablet Take 1 tablet (125 mcg total) by mouth daily before breakfast. 90 tablet 3  . losartan (COZAAR) 50 MG tablet Take 1 tablet (50 mg total) by mouth daily. 30 tablet 3  . lurasidone (LATUDA) 40 MG TABS tablet Take 40 mg by mouth daily with breakfast.    . metoprolol tartrate (LOPRESSOR) 25 MG tablet TAKE (1) TABLET TWICE DAILY. 60 tablet 0  . morphine (MSIR) 15 MG tablet Take 15 mg by mouth 2 (two) times daily.     . naloxegol oxalate (MOVANTIK) 25 MG TABS tablet Take 1 tablet (25 mg total) by mouth daily. 90 tablet 3  .  Omega-3 1000 MG CAPS Take by mouth.    . pantoprazole (PROTONIX) 40 MG tablet TAKE (1) TABLET TWICE DAILY. 180 tablet 3  . pantoprazole (PROTONIX) 40 MG tablet Take 1 tablet (40 mg total) by mouth 2 (two) times daily. 120 tablet 3  . potassium chloride (K-DUR) 10 MEQ tablet Take by mouth.    Marland Kitchen REXULTI 1 MG TABS tablet Take 1 mg by mouth daily.    . rosuvastatin (CRESTOR) 40 MG tablet Take 1 tablet (40 mg total) by mouth daily. 90 tablet 3  . valACYclovir (VALTREX) 500 MG tablet TAKE 1 TABLET BY MOUTH ONCE DAILY. 30 tablet 5  . [DISCONTINUED] esomeprazole (NEXIUM) 40 MG capsule Take 40 mg by mouth daily before breakfast.       No current facility-administered medications on file prior to visit.    No Known Allergies Social History   Socioeconomic History  . Marital status: Married    Spouse name: Not on file  . Number of children: 0  . Years of education: Not on file  . Highest education level: Not on file  Occupational History  . Not on file  Tobacco Use  . Smoking status: Former Smoker    Packs/day: 1.00    Types: Cigarettes  . Smokeless tobacco: Never Used  Vaping Use  . Vaping Use: Some days  Substance and Sexual Activity  . Alcohol use: No  . Drug use: No  . Sexual activity: Not on file  Other Topics Concern  . Not on file  Social  History Narrative  . Not on file   Social Determinants of Health   Financial Resource Strain:   . Difficulty of Paying Living Expenses: Not on file  Food Insecurity:   . Worried About Charity fundraiser in the Last Year: Not on file  . Ran Out of Food in the Last Year: Not on file  Transportation Needs:   . Lack of Transportation (Medical): Not on file  . Lack of Transportation (Non-Medical): Not on file  Physical Activity:   . Days of Exercise per Week: Not on file  . Minutes of Exercise per Session: Not on file  Stress:   . Feeling of Stress : Not on file  Social Connections:   . Frequency of Communication with Friends and Family: Not on file  . Frequency of Social Gatherings with Friends and Family: Not on file  . Attends Religious Services: Not on file  . Active Member of Clubs or Organizations: Not on file  . Attends Archivist Meetings: Not on file  . Marital Status: Not on file  Intimate Partner Violence:   . Fear of Current or Ex-Partner: Not on file  . Emotionally Abused: Not on file  . Physically Abused: Not on file  . Sexually Abused: Not on file      Review of Systems  All other systems reviewed and are negative.      Objective:   Physical Exam Vitals reviewed.  Constitutional:      General: He is not in acute distress.    Appearance: Normal appearance. He is not ill-appearing or toxic-appearing.  Cardiovascular:     Rate and Rhythm: Normal rate and regular rhythm.  Pulmonary:     Effort: Pulmonary effort is normal.     Breath sounds: Normal breath sounds.  Neurological:     Mental Status: He is alert.         Assessment & Plan:  Hypogonadism in male - Plan:  Testosterone Total,Free,Bio, Males, Prolactin  Chronic fatigue  Polypharmacy  Dyspnea on exertion - Plan: Ambulatory referral to Cardiology  I explained to the patient that I believe his hypersomnolence is likely multifactorial. I believe a large component of this is  polypharmacy. He is on numerous centrally acting medications including fentanyl. All of these could potentially cause sedation. However the patient feels that this is unlikely because he is been on these medication for a long time with never having the symptoms before. He does have a history of obstructive sleep apnea. He is not currently using any kind of CPAP or BiPAP. Therefore I have recommended a sleep study to evaluate for management of his obstructive sleep apnea. This is been scheduled and is pending at the present time. Patient also has hypogonadism which can certainly contribute to fatigue. I will repeat a testosterone level today along with a prolactin and if low I will start the patient on testosterone cypionate 200 mg IM every 2 weeks and recheck lab work in 3 months. Patient does report dyspnea on exertion. He has a history of peripheral vascular disease requiring stents. He also has a history of smoking and hyperlipidemia. Therefore he certainly has risk factors. I will consult cardiology for a stress test. However I do not believe that I can explain hypersomnolence with hypogonadism, anemia, or even coronary artery disease. I believe this is most likely due to a combination of untreated sleep apnea and polypharmacy. I recommended that the patient discuss the situation is with the doctors prescribing his psychiatric medication and his pain medication barring an abnormality seen on stress test with cardiology or significant improvement on testosterone replacement

## 2020-08-17 ENCOUNTER — Telehealth: Payer: Self-pay | Admitting: Family Medicine

## 2020-08-17 ENCOUNTER — Encounter: Payer: Self-pay | Admitting: Family Medicine

## 2020-08-17 LAB — TESTOSTERONE TOTAL,FREE,BIO, MALES
Albumin: 4.4 g/dL (ref 3.6–5.1)
Sex Hormone Binding: 28 nmol/L (ref 10–50)
Testosterone: 57 ng/dL — ABNORMAL LOW (ref 250–827)

## 2020-08-17 LAB — PROLACTIN: Prolactin: 5 ng/mL (ref 2.0–18.0)

## 2020-08-17 NOTE — Telephone Encounter (Signed)
Pt would like for his referral  fax to [facility]  [place]

## 2020-08-17 NOTE — Telephone Encounter (Signed)
I have changed the referral from [facility] to [facility]. I also called and spoke with patient to let him know that [facility] should reach out to him and schedule appt. Patient verbalized understanding.

## 2020-08-18 DIAGNOSIS — Z79891 Long term (current) use of opiate analgesic: Secondary | ICD-10-CM | POA: Diagnosis not present

## 2020-08-18 DIAGNOSIS — M47816 Spondylosis without myelopathy or radiculopathy, lumbar region: Secondary | ICD-10-CM | POA: Diagnosis not present

## 2020-08-18 DIAGNOSIS — M47812 Spondylosis without myelopathy or radiculopathy, cervical region: Secondary | ICD-10-CM | POA: Diagnosis not present

## 2020-08-18 DIAGNOSIS — M62830 Muscle spasm of back: Secondary | ICD-10-CM | POA: Diagnosis not present

## 2020-08-18 DIAGNOSIS — G894 Chronic pain syndrome: Secondary | ICD-10-CM | POA: Diagnosis not present

## 2020-08-19 ENCOUNTER — Other Ambulatory Visit: Payer: Self-pay | Admitting: Family Medicine

## 2020-08-19 DIAGNOSIS — R000 Tachycardia, unspecified: Secondary | ICD-10-CM

## 2020-08-19 MED ORDER — TESTOSTERONE CYPIONATE 200 MG/ML IM SOLN: 200.0000 mg | INTRAMUSCULAR | 0 refills | Status: DC

## 2020-09-01 ENCOUNTER — Other Ambulatory Visit: Payer: Self-pay | Admitting: Family Medicine

## 2020-09-01 DIAGNOSIS — R000 Tachycardia, unspecified: Secondary | ICD-10-CM

## 2020-09-09 ENCOUNTER — Ambulatory Visit: Payer: Medicare HMO | Admitting: Neurology

## 2020-09-09 ENCOUNTER — Encounter: Payer: Self-pay | Admitting: Neurology

## 2020-09-09 ENCOUNTER — Other Ambulatory Visit: Payer: Self-pay

## 2020-09-09 VITALS — BP 124/73 | HR 80 | Ht 72.0 in | Wt 243.0 lb

## 2020-09-09 DIAGNOSIS — Z789 Other specified health status: Secondary | ICD-10-CM

## 2020-09-09 DIAGNOSIS — G4719 Other hypersomnia: Secondary | ICD-10-CM

## 2020-09-09 DIAGNOSIS — G4733 Obstructive sleep apnea (adult) (pediatric): Secondary | ICD-10-CM

## 2020-09-09 DIAGNOSIS — F518 Other sleep disorders not due to a substance or known physiological condition: Secondary | ICD-10-CM

## 2020-09-09 DIAGNOSIS — G4710 Hypersomnia, unspecified: Secondary | ICD-10-CM | POA: Diagnosis not present

## 2020-09-09 DIAGNOSIS — R69 Illness, unspecified: Secondary | ICD-10-CM | POA: Diagnosis not present

## 2020-09-09 NOTE — Patient Instructions (Signed)
Narcolepsy Narcolepsy is a neurological disorder that causes people to fall asleep suddenly and without control (have sleep attacks) during the daytime. It is a lifelong disorder. Narcolepsy disrupts the sleep cycle at night, which then causes daytime sleepiness. What are the causes? The cause of narcolepsy is not fully understood, but it may be related to:  Low levels of hypocretin, a chemical (neurotransmitter) in the brain that controls sleep and wake cycles. Hypocretin imbalance may be caused by: ? Abnormal genes that are passed from parent to child (inherited). ? An autoimmune disease in which the body's defense system (immune system) attacks the brain cells that make hypocretin.  Infection, tumor, or injury in the area of the brain that controls sleep.  Exposure to poisons (toxins), such as heavy metals, pesticides, and secondhand smoke. What are the signs or symptoms? Symptoms of this condition include:  Excessive daytime sleepiness. This is the most common symptom and is usually the first symptom you will notice. This may affect your performance at work or school.  Sleep attacks. You may fall asleep in the middle of an activity, especially low-energy activities like reading or watching TV.  Feeling like you cannot think clearly and trouble focusing or remembering things. You may also feel depressed.  Sudden muscle weakness (cataplexy). When this occurs, your speech may become slurred, or your knees may buckle. Cataplexy is usually triggered by surprise, anger, fear, or laughter.  Losing the ability to speak or move (sleep paralysis). This may occur just as you start to fall asleep or wake up. You will be aware of the paralysis. It usually lasts for just a few seconds or minutes.  Seeing, hearing, tasting, smelling, or feeling things that are not real (hallucinations). Hallucinations may occur with sleep paralysis. They can happen when you are falling asleep, waking up, or  dozing.  Trouble staying asleep at night (insomnia) and restless sleep. How is this diagnosed? This condition may be diagnosed based on:  A physical exam to rule out any other problems that may be causing your symptoms. You may be asked to write down your sleeping patterns for several weeks in a sleep diary. This will help your health care provider make a diagnosis.  Sleep studies that measure how well your REM sleep is regulated. These tests also measure your heart rate, breathing, movement, and brain waves. These tests include: ? An overnight sleep study (polysomnogram). ? A daytime sleep study that is done while you take several naps during the day (multiple sleep latency test, MSLT). This test measures how quickly you fall asleep and how quickly you enter REM sleep.  Removal of spinal fluid to measure hypocretin levels. How is this treated? There is no cure for this condition, but treatment can help relieve symptoms. Treatment may include:  Lifestyle and sleeping strategies to help you cope with the condition, such as: ? Exercising regularly. ? Maintaining a regular sleep schedule. ? Avoiding caffeine and large meals before bed.  Medicines. These may include: ? Medicines that help keep you awake and alert (stimulants) to fight daytime sleepiness. ? Medicines that treat depression (antidepressants). These may be used to treat cataplexy. ? Sodium oxybate. This is a strong medicine to help you relax (sedative) that you may take at night. It can help control daytime sleepiness and cataplexy.  Other treatments may include mental health counseling or joining a support group. Follow these instructions at home: Sleeping habits   Get about 8 hours of sleep every night.  Go to   sleep and get up at about the same time every day.  Keep your bedroom dark, quiet, and comfortable.  When you feel very tired, take short naps. Schedule naps so that you take them at about the same time every  day.  Before bedtime: ? Avoid bright lights and screens. ? Relax. Try activities like reading or taking a warm bath. Activity  Get at least 20 minutes of exercise every day. This will help you sleep better at night and reduce daytime sleepiness.  Avoid exercising within 3 hours of bedtime.  Do not drive or use heavy machinery if you are sleepy. If possible, take a nap before driving.  Do not swim or go out on the water without a life jacket. Eating and drinking  Do not drink alcohol or caffeinated beverages within 4-5 hours of bedtime.  Do not eat a large meal before bedtime.  Eat meals at about the same times every day. General instructions   Take over-the-counter and prescription medicines only as told by your health care provider.  Keep a sleep diary as told by your health care provider.  Tell your employer or teachers that you have narcolepsy. You may be able to adjust your schedule to include time for naps.  Do not use any products that contain nicotine or tobacco, such as cigarettes, e-cigarettes, and chewing tobacco. If you need help quitting, ask your health care provider.  Keep all follow-up visits as told by your health care provider. This is important. Where to find more information  [employer] of Neurological Disorders: [employer]Box[employer].it Contact a health care provider if:  Your symptoms are not getting better.  You have increasingly high blood pressure (hypertension).  You have changes in your heart rhythm.  You are having a hard time determining what is real and what is not (psychosis). Get help right away if you:  Hurt yourself during a sleep attack or an attack of cataplexy.  Have chest pain.  Have trouble breathing. These symptoms may represent a serious problem that is an emergency. Do not wait to see if the symptoms will go away. Get medical help right away. Call your local emergency services (911 in the [place] Do not drive yourself to the  hospital. Summary  Narcolepsy is a neurological disorder that causes people to fall asleep suddenly, and without control, during the daytime (sleep attacks). It is a lifelong disorder.  There is no cure for this condition, but treatment can help relieve symptoms.  Go to sleep and get up at about the same time every day. Follow instructions about sleep and activities as told by your health care provider.  Take over-the-counter and prescription medicines only as told by your health care provider. This information is not intended to replace advice given to you by your health care provider. Make sure you discuss any questions you have with your health care provider. Document Revised: 06/25/2019 Document Reviewed: 06/25/2019 Elsevier Patient Education  [place]. Hypersomnia Hypersomnia is a condition in which a person feels very tired during the day even though he or she gets plenty of sleep at night. A person with this condition may take naps during the day and may find it very difficult to wake up from sleep. Hypersomnia may affect a person's ability to think, concentrate, drive, or remember things. What are the causes? The cause of this condition may not be known. Possible causes include:  Certain medicines.  Sleep disorders, such as narcolepsy and sleep apnea.  Injury to  the head, brain, or spinal cord.  Drug or alcohol use.  Gastroesophageal reflux disease (GERD).  Tumors.  Certain medical conditions, such as depression, diabetes, or an underactive thyroid gland (hypothyroidism). What are the signs or symptoms? The main symptoms of hypersomnia include:  Feeling very tired throughout the day, regardless of how much sleep you got the night before.  Having trouble waking up. Others may find it difficult to wake you up when you are sleeping.  Sleeping for longer and longer periods at a time.  Taking naps throughout the day. Other symptoms may include:  Feeling  restless, anxious, or annoyed.  Lacking energy.  Having trouble with: ? Remembering. ? Speaking. ? Thinking.  Loss of appetite.  Seeing, hearing, tasting, smelling, or feeling things that are not real (hallucinations). How is this diagnosed? This condition may be diagnosed based on:  Your symptoms and medical history.  Your sleeping habits. Your health care provider may ask you to write down your sleeping habits in a daily sleep log, along with any symptoms you have.  A series of tests that are done while you sleep (sleep study or polysomnogram).  A test that measures how quickly you can fall asleep during the day (daytime nap study or multiple sleep latency test). How is this treated? Treatment can help you manage your condition. Treatment may include:  Following a regular sleep routine.  Lifestyle changes, such as changing your eating habits, getting regular exercise, and avoiding alcohol or caffeinated beverages.  Taking medicines to make you more alert (stimulants) during the day.  Treating any underlying medical causes of hypersomnia. Follow these instructions at home: Sleep routine   Schedule the same bedtime and wake-up time each day.  Practice a relaxing bedtime routine. This may include reading, meditation, deep breathing, or taking a warm bath before going to sleep.  Get regular exercise each day. Avoid strenuous exercise in the evening hours.  Keep your sleep environment at a cooler temperature, darkened, and quiet.  Sleep with pillows and a mattress that are comfortable and supportive.  Schedule short 20-minute naps for when you feel sleepiest during the day.  Talk with your employer or teachers about your hypersomnia. If possible, adjust your schedule so that: ? You have a regular daytime work schedule. ? You can take a scheduled nap during the day. ? You do not have to work or be active at night.  Do not eat a heavy meal for a few hours before  bedtime. Eat your meals at about the same times every day.  Avoid drinking alcohol or caffeinated beverages. Safety   Do not drive or use heavy machinery if you are sleepy. Ask your health care provider if it is safe for you to drive.  Wear a life jacket when swimming or spending time near water. General instructions  Take supplements and over-the-counter and prescription medicines only as told by your health care provider.  Keep a sleep log that will help your doctor manage your condition. This may include information about: ? What time you go to bed each night. ? How often you wake up at night. ? How many hours you sleep at night. ? How often and for how long you nap during the day. ? Any observations from others, such as leg movements during sleep, sleep walking, or snoring.  Keep all follow-up visits as told by your health care provider. This is important. Contact a health care provider if:  You have new symptoms.  Your symptoms  get worse. Get help right away if:  You have serious thoughts about hurting yourself or someone else. If you ever feel like you may hurt yourself or others, or have thoughts about taking your own life, get help right away. You can go to your nearest emergency department or call:  Your local emergency services (911 in the [place]  A suicide crisis helpline, such as the [place] at (210) 091-7046. This is open 24 hours a day. Summary  Hypersomnia refers to a condition in which you feel very tired during the day even though you get plenty of sleep at night.  A person with this condition may take naps during the day and may find it very difficult to wake up from sleep.  Hypersomnia may affect a person's ability to think, concentrate, drive, or remember things.  Treatment, such as following a regular sleep routine and making some lifestyle changes, can help you manage your condition. This information is not intended to  replace advice given to you by your health care provider. Make sure you discuss any questions you have with your health care provider. Document Revised: 11/15/2017 Document Reviewed: 11/15/2017 Elsevier Patient Education  2020 [employer].
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
## 2020-09-10 ENCOUNTER — Other Ambulatory Visit: Payer: Self-pay

## 2020-09-10 ENCOUNTER — Ambulatory Visit: Payer: Medicare HMO | Admitting: Cardiology

## 2020-09-10 ENCOUNTER — Encounter: Payer: Self-pay | Admitting: Cardiology

## 2020-09-10 VITALS — BP 132/86 | HR 69 | Ht 72.0 in | Wt 245.6 lb

## 2020-09-10 DIAGNOSIS — R0600 Dyspnea, unspecified: Secondary | ICD-10-CM | POA: Diagnosis not present

## 2020-09-10 DIAGNOSIS — R0609 Other forms of dyspnea: Secondary | ICD-10-CM

## 2020-09-10 NOTE — Patient Instructions (Signed)
Medication Instructions:  Continue all current medications.  Labwork: none  Testing/Procedures: none  Follow-Up: As needed.    Any Other Special Instructions Will Be Listed Below (If Applicable).  If you need a refill on your cardiac medications before your next appointment, please call your pharmacy.  

## 2020-09-10 NOTE — Progress Notes (Signed)
Clinical Summary Mr. Polinski is a 54 y.o.male seen as new consult, referred by Dr Dennard Schaumann for the following medical problems.   1. DOE - symptoms started about 1 month ago - weight gain 30 lbs - at its worst DOE just walking in from parking lot - reports over the last few weeks symptoms are improving.  - heavy walking at the beach recently, no symptoms.  - no recent edema. No chest pains.    2. PAD - history of prior stent per his report  3. AAA - followed by pcp, 3.4 cm by 12/2019 [place]    Past Medical History:  Diagnosis Date  . AAA (abdominal aortic aneurysm) without rupture (HCC)    3.4 cm found on mri 2/20  . Acid reflux   . Anemia   . Anxiety   . Aortic atherosclerosis ([place])   . Depression   . Diverticulitis   . Genital warts   . Hyperlipidemia   . Hypertension   . Hypothyroidism   . Meningitis   . PAD (peripheral artery disease) ([place])   . SIADH (syndrome of inappropriate ADH production) ([place])   . Sleep apnea 12/2015   uses bipap  . Smoker   . Smoker   . Spondylosis   . Testosterone deficiency      No Known Allergies   Current Outpatient Medications  Medication Sig Dispense Refill  . amphetamine-dextroamphetamine (ADDERALL XR) 20 MG 24 hr capsule in the morning and at bedtime.     . Armodafinil (NUVIGIL) 200 MG TABS Take 200 mg by mouth daily. 30 tablet   . aspirin EC 81 MG tablet Take 81 mg by mouth daily.    Marland Kitchen b complex vitamins tablet Take by mouth.    . baclofen (LIORESAL) 10 MG tablet Take 10 mg by mouth 3 (three) times daily.    . clonazePAM (KLONOPIN) 1 MG tablet Take 1 mg by mouth 2 (two) times daily.     . colestipol (COLESTID) 1 g tablet TAKE (1) TABLET TWICE DAILY. 60 tablet 0  . DULoxetine (CYMBALTA) 60 MG capsule Take 60 mg by mouth daily.    . fentaNYL (DURAGESIC - DOSED MCG/HR) 50 MCG/HR Place 50 mcg onto the skin every 3 (three) days.   0  . furosemide (LASIX) 40 MG tablet TAKE 1 TABLET BY MOUTH TWICE DAILY AS NEEDED. (Patient  taking differently: as needed. TAKE 1 TABLET BY MOUTH TWICE DAILY AS NEEDED.) 60 tablet 0  . lamoTRIgine (LAMICTAL) 200 MG tablet Take 200 mg by mouth 2 (two) times daily.    Marland Kitchen levothyroxine (SYNTHROID) 125 MCG tablet Take 1 tablet (125 mcg total) by mouth daily before breakfast. 90 tablet 3  . losartan (COZAAR) 50 MG tablet Take 1 tablet (50 mg total) by mouth daily. 30 tablet 3  . lurasidone (LATUDA) 40 MG TABS tablet Take 40 mg by mouth daily with breakfast.    . metoprolol tartrate (LOPRESSOR) 25 MG tablet TAKE (1) TABLET TWICE DAILY. 60 tablet 0  . morphine (MSIR) 15 MG tablet Take 15 mg by mouth 2 (two) times daily.     . naloxegol oxalate (MOVANTIK) 25 MG TABS tablet Take 1 tablet (25 mg total) by mouth daily. 90 tablet 3  . Omega-3 1000 MG CAPS Take by mouth.    . pantoprazole (PROTONIX) 40 MG tablet TAKE (1) TABLET TWICE DAILY. 180 tablet 3  . pantoprazole (PROTONIX) 40 MG tablet Take 1 tablet (40 mg total) by mouth 2 (two) times  daily. 120 tablet 3  . potassium chloride (K-DUR) 10 MEQ tablet Take by mouth. (Patient not taking: Reported on 09/09/2020)    . pregabalin (LYRICA) 75 MG capsule Take 75 mg by mouth 3 (three) times daily.    Marland Kitchen REXULTI 1 MG TABS tablet Take 1 mg by mouth daily.    . rosuvastatin (CRESTOR) 40 MG tablet Take 1 tablet (40 mg total) by mouth daily. 90 tablet 3  . testosterone cypionate (DEPOTESTOSTERONE CYPIONATE) 200 MG/ML injection Inject 1 mL (200 mg total) into the muscle every 14 (fourteen) days. 6 mL 0  . valACYclovir (VALTREX) 500 MG tablet TAKE 1 TABLET BY MOUTH ONCE DAILY. 30 tablet 5   No current facility-administered medications for this visit.     Past Surgical History:  Procedure Laterality Date  . APPENDECTOMY  12/2018   at [facility]  . CHOLECYSTECTOMY  2007  . CLOSED REDUCTION MANDIBLE N/A 03/09/2018   Procedure: CLOSED REDUCTION MANDIBULAR MMF;  Surgeon: Jerrell Belfast, MD;  Location: [facility];  Service: ENT;  Laterality: N/A;  . COLONOSCOPY   07/26/2020   10/22/2018  . KNEE SURGERY     right  . LAPAROSCOPIC APPENDECTOMY N/A 07/25/2018   Procedure: APPENDECTOMY LAPAROSCOPIC;  Surgeon: Aviva Signs, MD;  Location: [facility];  Service: General;  Laterality: N/A;  . MANDIBULAR HARDWARE REMOVAL N/A 05/06/2018   Procedure: REMOVAL OF MMF HARDWARE;  Surgeon: Jerrell Belfast, MD;  Location: [facility];  Service: ENT;  Laterality: N/A;  . NASAL SEPTUM SURGERY    . NASAL SINUS SURGERY    . POLYPECTOMY    . right sfa recanalization and stenting      [facility] 12/2015  . UPPER GASTROINTESTINAL ENDOSCOPY  07/26/2020  . WRIST SURGERY     right and left     No Known Allergies    Family History  Problem Relation Age of Onset  . Other Father        killed in [place]  . Cancer Maternal Grandfather        stomach  . Stomach cancer Maternal Grandfather   . Colon cancer Neg Hx   . Colitis Neg Hx   . Esophageal cancer Neg Hx   . Rectal cancer Neg Hx      Social History Mr. Dorvil reports that he has quit smoking. His smoking use included cigarettes. He smoked 1.00 pack per day. He has never used smokeless tobacco. Mr. Schiff reports no history of alcohol use.   Review of Systems CONSTITUTIONAL: No weight loss, fever, chills, weakness or fatigue.  HEENT: Eyes: No visual loss, blurred vision, double vision or yellow sclerae.No hearing loss, sneezing, congestion, runny nose or sore throat.  SKIN: No rash or itching.  CARDIOVASCULAR: per hpi RESPIRATORY: No shortness of breath, cough or sputum.  GASTROINTESTINAL: No anorexia, nausea, vomiting or diarrhea. No abdominal pain or blood.  GENITOURINARY: No burning on urination, no polyuria NEUROLOGICAL: No headache, dizziness, syncope, paralysis, ataxia, numbness or tingling in the extremities. No change in bowel or bladder control.  MUSCULOSKELETAL: No muscle, back pain, joint pain or stiffness.  LYMPHATICS: No enlarged nodes. No history of splenectomy.  PSYCHIATRIC: No  history of depression or anxiety.  ENDOCRINOLOGIC: No reports of sweating, cold or heat intolerance. No polyuria or polydipsia.  Marland Kitchen   Physical Examination Today's Vitals   09/10/20 1459  BP: 132/86  Pulse: 69  SpO2: 92%  Weight: 245 lb 9.6 oz (111.4 kg)  Height: 6' (1.829 m)   Body mass  index is 33.31 kg/m.  Gen: resting comfortably, no acute distress HEENT: no scleral icterus, pupils equal round and reactive, no palptable cervical adenopathy,  CV: RRR, no m/r/g, no jvd Resp: Clear to auscultation bilaterally GI: abdomen is soft, non-tender, non-distended, normal bowel sounds, no hepatosplenomegaly MSK: extremities are warm, no edema.  Skin: warm, no rash Neuro:  no focal deficits Psych: appropriate affect   Diagnostic Studies 10/2013 echo Study Conclusions   - Study data: Technically adequate study.  - Left ventricle: The cavity size was normal. Wall thickness  was increased in a pattern of mild LVH. Systolic function  was normal. The estimated ejection fraction was in the  range of 60% to 65%. Wall motion was normal; there were no  regional wall motion abnormalities. Doppler parameters are  consistent with abnormal left ventricular relaxation  (grade 1 diastolic dysfunction).  - Aortic valve: Valve area: 3.06cm^2(VTI). Valve area:  2.86cm^2 (Vmax).  - Atrial septum: No defect or patent foramen ovale was  identified.  - Inferior vena cava: The vessel was normal in size; the  respirophasic diameter changes were in the normal range (=  50%); findings are consistent with normal central venous  pressure.        Assessment and Plan  1. DOE - he relates symptoms to recent period of severe inactivity, hypersomnolence, 30 lbs weight gain - with increased activity his DOE has been improving. With recent beach trip was able to walk fairly long distances regularlty without any symptoms. No signs of fluid overload, no chest pains at any time -  appears symptoms most likely due to significant deconditioning that is improving. If recurrent symptoms could reconsider cardiac testing at that time.  -EKG shows SR, no ischemic changes     Arnoldo Lenis, M.D.

## 2020-09-16 ENCOUNTER — Encounter: Payer: Self-pay | Admitting: Neurology

## 2020-09-16 ENCOUNTER — Telehealth: Payer: Self-pay

## 2020-09-16 LAB — NARCOLEPSY EVALUATION
DQA1*01:02: POSITIVE
DQB1*06:02: NEGATIVE

## 2020-09-16 NOTE — Progress Notes (Signed)
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

## 2020-09-16 NOTE — Telephone Encounter (Signed)
LVM for pt to call me back to schedule sleep study  

## 2020-09-22 DIAGNOSIS — M62830 Muscle spasm of back: Secondary | ICD-10-CM | POA: Diagnosis not present

## 2020-09-22 DIAGNOSIS — M47812 Spondylosis without myelopathy or radiculopathy, cervical region: Secondary | ICD-10-CM | POA: Diagnosis not present

## 2020-09-22 DIAGNOSIS — M47816 Spondylosis without myelopathy or radiculopathy, lumbar region: Secondary | ICD-10-CM | POA: Diagnosis not present

## 2020-09-22 DIAGNOSIS — G894 Chronic pain syndrome: Secondary | ICD-10-CM | POA: Diagnosis not present

## 2020-09-24 ENCOUNTER — Other Ambulatory Visit: Payer: Self-pay | Admitting: Family Medicine

## 2020-09-28 ENCOUNTER — Telehealth: Payer: Self-pay

## 2020-09-28 NOTE — Telephone Encounter (Signed)
Pt nds PA for a rx, had to call pharmacy and give correct fax number

## 2020-10-09 DIAGNOSIS — S60221A Contusion of right hand, initial encounter: Secondary | ICD-10-CM | POA: Diagnosis not present

## 2020-10-18 ENCOUNTER — Ambulatory Visit (INDEPENDENT_AMBULATORY_CARE_PROVIDER_SITE_OTHER): Payer: Medicare HMO | Admitting: Neurology

## 2020-10-18 DIAGNOSIS — F518 Other sleep disorders not due to a substance or known physiological condition: Secondary | ICD-10-CM

## 2020-10-18 DIAGNOSIS — G4733 Obstructive sleep apnea (adult) (pediatric): Secondary | ICD-10-CM

## 2020-10-18 DIAGNOSIS — G4719 Other hypersomnia: Secondary | ICD-10-CM

## 2020-10-18 DIAGNOSIS — Z789 Other specified health status: Secondary | ICD-10-CM

## 2020-10-18 DIAGNOSIS — G4710 Hypersomnia, unspecified: Secondary | ICD-10-CM

## 2020-10-20 DIAGNOSIS — M79641 Pain in right hand: Secondary | ICD-10-CM | POA: Diagnosis not present

## 2020-10-22 DIAGNOSIS — R69 Illness, unspecified: Secondary | ICD-10-CM | POA: Diagnosis not present

## 2020-10-25 DIAGNOSIS — M62830 Muscle spasm of back: Secondary | ICD-10-CM | POA: Diagnosis not present

## 2020-10-25 DIAGNOSIS — G894 Chronic pain syndrome: Secondary | ICD-10-CM | POA: Diagnosis not present

## 2020-10-25 DIAGNOSIS — M47816 Spondylosis without myelopathy or radiculopathy, lumbar region: Secondary | ICD-10-CM | POA: Diagnosis not present

## 2020-10-25 DIAGNOSIS — M47812 Spondylosis without myelopathy or radiculopathy, cervical region: Secondary | ICD-10-CM | POA: Diagnosis not present
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
## 2020-11-03 ENCOUNTER — Telehealth: Payer: Self-pay | Admitting: Neurology

## 2020-11-03 DIAGNOSIS — Z789 Other specified health status: Secondary | ICD-10-CM | POA: Insufficient documentation

## 2020-11-03 DIAGNOSIS — G4719 Other hypersomnia: Secondary | ICD-10-CM | POA: Insufficient documentation

## 2020-11-03 DIAGNOSIS — F518 Other sleep disorders not due to a substance or known physiological condition: Secondary | ICD-10-CM | POA: Insufficient documentation

## 2020-11-03 DIAGNOSIS — G4733 Obstructive sleep apnea (adult) (pediatric): Secondary | ICD-10-CM | POA: Insufficient documentation

## 2020-11-03 DIAGNOSIS — G4710 Hypersomnia, unspecified: Secondary | ICD-10-CM | POA: Insufficient documentation

## 2020-11-03 NOTE — Progress Notes (Signed)
See phone note: This newest home sleep test only documented an AHI of 17/h which is milder than in his last test.  This qualifies as mild to moderate sleep apnea.  During REM sleep there was an accentuation to the AHI of 24.4/h and the supine AHI was elevated at 18.8/h. No sleep hypoxemia was noted.Recommendation the patient can return to CPAP use presently latest sleep apnea evaluation shows no complex apnea ,only obstructive sleep apnea. In the past CPAP had not been well-tolerated -so he could be a candidate for a dental device or inspire implant instead. This would be up to the patient to decide if he wants to continue with the current device type or a new therapy.

## 2020-11-03 NOTE — Telephone Encounter (Signed)
RESULTS- Home Sleep Test   This newest HST only documented an AHI of 17/h, this is mild - moderate apnea. During REM sleep there was an accentuation to AHI of 24.4/h. and in supine sleep the AHI was 18.8/h.  There was no sleep hypoxemia  noted.  Recommendations:                                            The patient can return to CPAP use, but this latest sleep apnea evaluation shows no complex apnea, only OSA.  The treatment would be CPAP, a dental device or Inspire implant.  PS:  CPAP had not been well tolerated in the past- and he remained with hypersomnia concern after starting ASV.

## 2020-11-04 ENCOUNTER — Telehealth: Payer: Self-pay | Admitting: Neurology

## 2020-11-04 DIAGNOSIS — G4719 Other hypersomnia: Secondary | ICD-10-CM

## 2020-11-04 DIAGNOSIS — G4733 Obstructive sleep apnea (adult) (pediatric): Secondary | ICD-10-CM

## 2020-11-04 DIAGNOSIS — G4710 Hypersomnia, unspecified: Secondary | ICD-10-CM

## 2020-11-04 NOTE — Telephone Encounter (Signed)
-----   Message from Larey Seat, MD sent at 11/03/2020  4:43 PM EST ----- See phone note: This newest home sleep test only documented an AHI of 17/h which is milder than in his last test.  This qualifies as mild to moderate sleep apnea.  During REM sleep there was an accentuation to the AHI of 24.4/h and the supine AHI was elevated at 18.8/h. No sleep hypoxemia was noted.Recommendation the patient can return to CPAP use presently latest sleep apnea evaluation shows no complex apnea ,only obstructive sleep apnea. In the past CPAP had not been well-tolerated -so he could be a candidate for a dental device or inspire implant instead. This would be up to the patient to decide if he wants to continue with the current device type or a new therapy.

## 2020-11-04 NOTE — Telephone Encounter (Signed)
Called and reviewed the sleep study results. At this time patient is in need of supplies to use his current machine.  His current machine is ASV and this study only indicated OSA present. Informed the patient I would send order to [facility]) Advising he needs assisting with getting supplies set up for him to start using the machine.  Pt also is very interested in inspire procedure. Advised that requires referral to ENT for them to determine if he is eligible. Pt verbalized understanding. Order placed for supplies and referral for ENT

## 2020-11-05 ENCOUNTER — Other Ambulatory Visit: Payer: Self-pay

## 2020-11-05 MED ORDER — LOSARTAN POTASSIUM 50 MG PO TABS
50.0000 mg | ORAL_TABLET | Freq: Every day | ORAL | 3 refills | Status: DC
Start: 2020-11-05 — End: 2020-12-06

## 2020-11-15 ENCOUNTER — Other Ambulatory Visit: Payer: Self-pay | Admitting: Family Medicine

## 2020-11-15 DIAGNOSIS — R000 Tachycardia, unspecified: Secondary | ICD-10-CM

## 2020-11-16 DIAGNOSIS — M79641 Pain in right hand: Secondary | ICD-10-CM | POA: Diagnosis not present

## 2020-12-02 ENCOUNTER — Other Ambulatory Visit (INDEPENDENT_AMBULATORY_CARE_PROVIDER_SITE_OTHER): Payer: Medicare HMO

## 2020-12-02 DIAGNOSIS — D649 Anemia, unspecified: Secondary | ICD-10-CM | POA: Diagnosis not present

## 2020-12-02 DIAGNOSIS — M47812 Spondylosis without myelopathy or radiculopathy, cervical region: Secondary | ICD-10-CM | POA: Diagnosis not present

## 2020-12-02 DIAGNOSIS — G894 Chronic pain syndrome: Secondary | ICD-10-CM | POA: Diagnosis not present

## 2020-12-02 DIAGNOSIS — M62830 Muscle spasm of back: Secondary | ICD-10-CM | POA: Diagnosis not present

## 2020-12-02 DIAGNOSIS — M47816 Spondylosis without myelopathy or radiculopathy, lumbar region: Secondary | ICD-10-CM | POA: Diagnosis not present

## 2020-12-02 LAB — IRON: Iron: 81 ug/dL (ref 42–165)

## 2020-12-02 LAB — FERRITIN: Ferritin: 86 ng/mL (ref 22.0–322.0)

## 2020-12-06 ENCOUNTER — Other Ambulatory Visit: Payer: Self-pay | Admitting: Family Medicine

## 2020-12-08 DIAGNOSIS — M79641 Pain in right hand: Secondary | ICD-10-CM | POA: Diagnosis not present

## 2020-12-08 DIAGNOSIS — M25531 Pain in right wrist: Secondary | ICD-10-CM | POA: Diagnosis not present

## 2020-12-16 ENCOUNTER — Other Ambulatory Visit: Payer: Self-pay | Admitting: Family Medicine

## 2020-12-16 DIAGNOSIS — R000 Tachycardia, unspecified: Secondary | ICD-10-CM

## 2020-12-27 ENCOUNTER — Other Ambulatory Visit: Payer: Self-pay | Admitting: Family Medicine

## 2021-01-01 DIAGNOSIS — M25531 Pain in right wrist: Secondary | ICD-10-CM | POA: Diagnosis not present

## 2021-01-07 DIAGNOSIS — M25531 Pain in right wrist: Secondary | ICD-10-CM | POA: Diagnosis not present

## 2021-01-07 DIAGNOSIS — M79641 Pain in right hand: Secondary | ICD-10-CM | POA: Diagnosis not present

## 2021-01-13 DIAGNOSIS — R69 Illness, unspecified: Secondary | ICD-10-CM | POA: Diagnosis not present

## 2021-01-13 DIAGNOSIS — F4312 Post-traumatic stress disorder, chronic: Secondary | ICD-10-CM | POA: Diagnosis not present

## 2021-01-13 DIAGNOSIS — M47812 Spondylosis without myelopathy or radiculopathy, cervical region: Secondary | ICD-10-CM | POA: Diagnosis not present

## 2021-01-13 DIAGNOSIS — G4730 Sleep apnea, unspecified: Secondary | ICD-10-CM | POA: Diagnosis not present

## 2021-01-13 DIAGNOSIS — G47419 Narcolepsy without cataplexy: Secondary | ICD-10-CM | POA: Diagnosis not present

## 2021-01-13 DIAGNOSIS — G894 Chronic pain syndrome: Secondary | ICD-10-CM | POA: Diagnosis not present

## 2021-01-13 DIAGNOSIS — M47816 Spondylosis without myelopathy or radiculopathy, lumbar region: Secondary | ICD-10-CM | POA: Diagnosis not present

## 2021-01-13 DIAGNOSIS — M62830 Muscle spasm of back: Secondary | ICD-10-CM | POA: Diagnosis not present

## 2021-01-15 ENCOUNTER — Other Ambulatory Visit: Payer: Self-pay | Admitting: Family Medicine

## 2021-01-15 DIAGNOSIS — R000 Tachycardia, unspecified: Secondary | ICD-10-CM

## 2021-01-20 DIAGNOSIS — Z79899 Other long term (current) drug therapy: Secondary | ICD-10-CM | POA: Diagnosis not present

## 2021-01-30 ENCOUNTER — Other Ambulatory Visit: Payer: Self-pay | Admitting: Family Medicine

## 2021-02-08 DIAGNOSIS — G629 Polyneuropathy, unspecified: Secondary | ICD-10-CM | POA: Diagnosis not present

## 2021-02-08 DIAGNOSIS — E669 Obesity, unspecified: Secondary | ICD-10-CM | POA: Diagnosis not present

## 2021-02-08 DIAGNOSIS — G8929 Other chronic pain: Secondary | ICD-10-CM | POA: Diagnosis not present

## 2021-02-08 DIAGNOSIS — R69 Illness, unspecified: Secondary | ICD-10-CM | POA: Diagnosis not present

## 2021-02-08 DIAGNOSIS — I10 Essential (primary) hypertension: Secondary | ICD-10-CM | POA: Diagnosis not present

## 2021-02-08 DIAGNOSIS — E785 Hyperlipidemia, unspecified: Secondary | ICD-10-CM | POA: Diagnosis not present

## 2021-02-08 DIAGNOSIS — E039 Hypothyroidism, unspecified: Secondary | ICD-10-CM | POA: Diagnosis not present

## 2021-02-08 DIAGNOSIS — F909 Attention-deficit hyperactivity disorder, unspecified type: Secondary | ICD-10-CM | POA: Diagnosis not present

## 2021-02-08 DIAGNOSIS — K219 Gastro-esophageal reflux disease without esophagitis: Secondary | ICD-10-CM | POA: Diagnosis not present

## 2021-02-08 DIAGNOSIS — B009 Herpesviral infection, unspecified: Secondary | ICD-10-CM | POA: Diagnosis not present

## 2021-02-10 DIAGNOSIS — M62830 Muscle spasm of back: Secondary | ICD-10-CM | POA: Diagnosis not present

## 2021-02-10 DIAGNOSIS — M47812 Spondylosis without myelopathy or radiculopathy, cervical region: Secondary | ICD-10-CM | POA: Diagnosis not present

## 2021-02-10 DIAGNOSIS — M47816 Spondylosis without myelopathy or radiculopathy, lumbar region: Secondary | ICD-10-CM | POA: Diagnosis not present

## 2021-02-10 DIAGNOSIS — G4733 Obstructive sleep apnea (adult) (pediatric): Secondary | ICD-10-CM | POA: Diagnosis not present

## 2021-02-10 DIAGNOSIS — G894 Chronic pain syndrome: Secondary | ICD-10-CM | POA: Diagnosis not present

## 2021-02-10 DIAGNOSIS — H61892 Other specified disorders of left external ear: Secondary | ICD-10-CM | POA: Diagnosis not present

## 2021-02-10 DIAGNOSIS — J343 Hypertrophy of nasal turbinates: Secondary | ICD-10-CM | POA: Diagnosis not present

## 2021-02-10 DIAGNOSIS — Z79891 Long term (current) use of opiate analgesic: Secondary | ICD-10-CM | POA: Diagnosis not present

## 2021-02-18 DIAGNOSIS — M25531 Pain in right wrist: Secondary | ICD-10-CM | POA: Diagnosis not present

## 2021-02-18 DIAGNOSIS — M79641 Pain in right hand: Secondary | ICD-10-CM | POA: Diagnosis not present

## 2021-02-23 ENCOUNTER — Other Ambulatory Visit: Payer: Self-pay | Admitting: Family Medicine

## 2021-02-23 DIAGNOSIS — R000 Tachycardia, unspecified: Secondary | ICD-10-CM

## 2021-03-07 ENCOUNTER — Other Ambulatory Visit: Payer: Self-pay | Admitting: Family Medicine

## 2021-03-09 ENCOUNTER — Other Ambulatory Visit: Payer: Self-pay | Admitting: Otolaryngology

## 2021-03-21 DIAGNOSIS — M47812 Spondylosis without myelopathy or radiculopathy, cervical region: Secondary | ICD-10-CM | POA: Diagnosis not present

## 2021-03-21 DIAGNOSIS — M47816 Spondylosis without myelopathy or radiculopathy, lumbar region: Secondary | ICD-10-CM | POA: Diagnosis not present

## 2021-03-21 DIAGNOSIS — G894 Chronic pain syndrome: Secondary | ICD-10-CM | POA: Diagnosis not present

## 2021-03-21 DIAGNOSIS — M62830 Muscle spasm of back: Secondary | ICD-10-CM | POA: Diagnosis not present

## 2021-03-22 ENCOUNTER — Ambulatory Visit: Payer: Medicare HMO | Admitting: Family Medicine

## 2021-03-22 ENCOUNTER — Other Ambulatory Visit: Payer: Medicare HMO

## 2021-03-26 DIAGNOSIS — Z23 Encounter for immunization: Secondary | ICD-10-CM | POA: Diagnosis not present

## 2021-03-29 ENCOUNTER — Other Ambulatory Visit: Payer: Self-pay | Admitting: Family Medicine

## 2021-03-29 ENCOUNTER — Other Ambulatory Visit: Payer: Self-pay

## 2021-03-29 ENCOUNTER — Encounter (HOSPITAL_BASED_OUTPATIENT_CLINIC_OR_DEPARTMENT_OTHER): Payer: Self-pay | Admitting: Otolaryngology

## 2021-03-29 DIAGNOSIS — R000 Tachycardia, unspecified: Secondary | ICD-10-CM

## 2021-03-31 ENCOUNTER — Ambulatory Visit (INDEPENDENT_AMBULATORY_CARE_PROVIDER_SITE_OTHER): Payer: Medicare HMO | Admitting: Family Medicine

## 2021-03-31 ENCOUNTER — Other Ambulatory Visit: Payer: Self-pay

## 2021-03-31 ENCOUNTER — Encounter: Payer: Self-pay | Admitting: Family Medicine

## 2021-03-31 VITALS — BP 124/68 | HR 84 | Temp 98.4°F | Resp 16 | Ht 72.0 in | Wt 255.0 lb

## 2021-03-31 DIAGNOSIS — I714 Abdominal aortic aneurysm, without rupture, unspecified: Secondary | ICD-10-CM

## 2021-03-31 DIAGNOSIS — I2510 Atherosclerotic heart disease of native coronary artery without angina pectoris: Secondary | ICD-10-CM

## 2021-03-31 DIAGNOSIS — Z79899 Other long term (current) drug therapy: Secondary | ICD-10-CM

## 2021-03-31 DIAGNOSIS — Z125 Encounter for screening for malignant neoplasm of prostate: Secondary | ICD-10-CM | POA: Diagnosis not present

## 2021-03-31 LAB — COMPLETE METABOLIC PANEL WITH GFR
AG Ratio: 2.1 (calc) (ref 1.0–2.5)
ALT: 6 U/L — ABNORMAL LOW (ref 9–46)
AST: 10 U/L (ref 10–35)
Albumin: 4.2 g/dL (ref 3.6–5.1)
Alkaline phosphatase (APISO): 95 U/L (ref 35–144)
BUN/Creatinine Ratio: 5 (calc) — ABNORMAL LOW (ref 6–22)
BUN: 6 mg/dL — ABNORMAL LOW (ref 7–25)
CO2: 31 mmol/L (ref 20–32)
Calcium: 9.2 mg/dL (ref 8.6–10.3)
Chloride: 101 mmol/L (ref 98–110)
Creat: 1.27 mg/dL (ref 0.70–1.33)
GFR, Est African American: 75 mL/min/{1.73_m2} (ref >=60)
GFR, Est Non African American: 65 mL/min/{1.73_m2} (ref >=60)
Globulin: 2 g/dL (calc) (ref 1.9–3.7)
Glucose, Bld: 110 mg/dL — ABNORMAL HIGH (ref 65–99)
Potassium: 4.5 mmol/L (ref 3.5–5.3)
Sodium: 140 mmol/L (ref 135–146)
Total Bilirubin: 0.8 mg/dL (ref 0.2–1.2)
Total Protein: 6.2 g/dL (ref 6.1–8.1)

## 2021-03-31 LAB — CBC WITH DIFFERENTIAL/PLATELET
Absolute Monocytes: 600 cells/uL (ref 200–950)
Basophils Absolute: 23 cells/uL (ref 0–200)
Basophils Relative: 0.3 %
Eosinophils Absolute: 0 cells/uL — ABNORMAL LOW (ref 15–500)
Eosinophils Relative: 0 %
HCT: 35.3 % — ABNORMAL LOW (ref 38.5–50.0)
Hemoglobin: 11.8 g/dL — ABNORMAL LOW (ref 13.2–17.1)
Lymphs Abs: 1823 cells/uL (ref 850–3900)
MCH: 29.2 pg (ref 27.0–33.0)
MCHC: 33.4 g/dL (ref 32.0–36.0)
MCV: 87.4 fL (ref 80.0–100.0)
MPV: 9.4 fL (ref 7.5–12.5)
Monocytes Relative: 8 %
Neutro Abs: 5055 cells/uL (ref 1500–7800)
Neutrophils Relative %: 67.4 %
Platelets: 228 10*3/uL (ref 140–400)
RBC: 4.04 10*6/uL — ABNORMAL LOW (ref 4.20–5.80)
RDW: 14.2 % (ref 11.0–15.0)
Total Lymphocyte: 24.3 %
WBC: 7.5 10*3/uL (ref 3.8–10.8)

## 2021-03-31 LAB — LIPID PANEL
Cholesterol: 123 mg/dL (ref <200)
HDL: 34 mg/dL — ABNORMAL LOW (ref >=40)
LDL Cholesterol (Calc): 62 mg/dL (calc)
Non-HDL Cholesterol (Calc): 89 mg/dL (calc) (ref <130)
Total CHOL/HDL Ratio: 3.6 (calc) (ref <5.0)
Triglycerides: 197 mg/dL — ABNORMAL HIGH (ref <150)

## 2021-03-31 LAB — PSA: PSA: 0.7 ng/mL (ref <=4.00)

## 2021-03-31 MED ORDER — SHINGRIX 50 MCG/0.5ML IM SUSR: 0.5000 mL | Freq: Once | INTRAMUSCULAR | 1 refills | Status: AC

## 2021-03-31 MED ORDER — TAMSULOSIN HCL 0.4 MG PO CAPS: 0.4000 mg | ORAL_CAPSULE | Freq: Every day | ORAL | 3 refills | Status: DC

## 2021-03-31 NOTE — Progress Notes (Signed)
Subjective:    Patient ID: Tony Campos, male    DOB: 1966-01-24, 55 y.o.   MRN: [id number] Patient has chronic back pain and sees a pain clinic who prescribes his baclofen, Lyrica, and fentanyl.  He seems extremely sleepy today and is slow to answer questions.  He is due for prostate cancer screening with a PSA.  His blood pressure today is well controlled at 124/68.  He denies any chest pain shortness of breath or dyspnea on exertion.  He states that he is only having a bowel movement once a week.  He is taking Movantik.  I encouraged him to add MiraLAX to avoid constipation and intestinal issues related to this.  His AAA was last checked in February 2021.  They recommended a repeat AAA screen in 3 years.  He has stopped taking testosterone.  He has developed some lower urinary tract symptoms including increased urinary frequency.  However he is having hesitancy and weak stream.  He states that sometimes he feels like he has to urinate and the urine will come out.  He will sit down because he is tired of standing and waiting.  He reports a dribbling stream at times.  This has been gradually building over the last year. Past Medical History:  Diagnosis Date  . AAA (abdominal aortic aneurysm) without rupture (HCC)    3.4 cm found on mri 2/20  . Acid reflux   . Anemia   . Anxiety   . Aortic atherosclerosis ([place])   . Bipolar disorder ([place])   . Depression   . Diverticulitis   . Dyspnea    exertional  . Genital warts   . Hyperlipidemia   . Hypertension   . Hypothyroidism   . Meningitis   . PAD (peripheral artery disease) (HCC)    femoral artery angioplasy/stent  . SIADH (syndrome of inappropriate ADH production) ([place])   . Sleep apnea 12/2015  . Smoker   . Smoker   . Spondylosis   . Testosterone deficiency    Past Surgical History:  Procedure Laterality Date  . APPENDECTOMY  12/2018   at [facility]  . CHOLECYSTECTOMY  2007  . CLOSED REDUCTION MANDIBLE N/A 03/09/2018   Procedure:  CLOSED REDUCTION MANDIBULAR MMF;  Surgeon: Jerrell Belfast, MD;  Location: [facility];  Service: ENT;  Laterality: N/A;  . COLONOSCOPY  07/26/2020   10/22/2018  . KNEE SURGERY     right  . LAPAROSCOPIC APPENDECTOMY N/A 07/25/2018   Procedure: APPENDECTOMY LAPAROSCOPIC;  Surgeon: Aviva Signs, MD;  Location: [facility];  Service: General;  Laterality: N/A;  . MANDIBULAR HARDWARE REMOVAL N/A 05/06/2018   Procedure: REMOVAL OF MMF HARDWARE;  Surgeon: Jerrell Belfast, MD;  Location: [facility];  Service: ENT;  Laterality: N/A;  . NASAL SEPTUM SURGERY    . NASAL SINUS SURGERY    . POLYPECTOMY    . right sfa recanalization and stenting      [facility] 12/2015  . UPPER GASTROINTESTINAL ENDOSCOPY  07/26/2020  . WRIST SURGERY     right and left   Current Outpatient Medications on File Prior to Visit  Medication Sig Dispense Refill  . amphetamine-dextroamphetamine (ADDERALL) 30 MG tablet Take 30 mg by mouth daily.    Marland Kitchen aspirin EC 81 MG tablet Take 81 mg by mouth daily.    . baclofen (LIORESAL) 10 MG tablet Take 10 mg by mouth 4 (four) times daily.    . clonazePAM (KLONOPIN) 1 MG tablet Take 1 mg by mouth  2 (two) times daily.    . DULoxetine (CYMBALTA) 60 MG capsule Take 60 mg by mouth daily.    . fentaNYL (DURAGESIC - DOSED MCG/HR) 50 MCG/HR Place 50 mcg onto the skin every 3 (three) days.   0  . ferrous sulfate 325 (65 FE) MG EC tablet Take 325 mg by mouth 3 (three) times daily with meals.    . furosemide (LASIX) 40 MG tablet TAKE 1 TABLET BY MOUTH TWICE DAILY AS NEEDED. (Patient taking differently: as needed. TAKE 1 TABLET BY MOUTH TWICE DAILY AS NEEDED.) 60 tablet 0  . lamoTRIgine (LAMICTAL) 200 MG tablet Take 200 mg by mouth daily.    Marland Kitchen levothyroxine (SYNTHROID) 125 MCG tablet TAKE 1 TABLET BY MOUTH DAILY BEFORE BREAKFAST. 90 tablet 0  . losartan-hydrochlorothiazide (HYZAAR) 50-12.5 MG tablet Take 1 tablet by mouth daily.    . metoprolol tartrate (LOPRESSOR) 25 MG tablet TAKE (1) TABLET  TWICE DAILY. 60 tablet 0  . morphine (MSIR) 15 MG tablet Take 15 mg by mouth 2 (two) times daily.    . naloxegol oxalate (MOVANTIK) 25 MG TABS tablet Take 1 tablet (25 mg total) by mouth daily. 90 tablet 3  . NARCAN 4 MG/0.1ML LIQD nasal spray kit SMARTSIG:1 Spray(s) Both Nares Once PRN    . pantoprazole (PROTONIX) 40 MG tablet TAKE (1) TABLET TWICE DAILY. 180 tablet 3  . pregabalin (LYRICA) 150 MG capsule Take 150 mg by mouth 2 (two) times daily.    Marland Kitchen REXULTI 1 MG TABS tablet Take 1 mg by mouth daily.    . rosuvastatin (CRESTOR) 40 MG tablet TAKE 1 TABLET DAILY. 30 tablet 0  . valACYclovir (VALTREX) 500 MG tablet TAKE 1 TABLET BY MOUTH ONCE DAILY. 30 tablet 0  . vitamin E 1000 UNIT capsule Take 1,000 Units by mouth daily.    . [DISCONTINUED] esomeprazole (NEXIUM) 40 MG capsule Take 40 mg by mouth daily before breakfast.     No current facility-administered medications on file prior to visit.    No Known Allergies Social History   Socioeconomic History  . Marital status: Married    Spouse name: Not on file  . Number of children: 0  . Years of education: Not on file  . Highest education level: Not on file  Occupational History  . Not on file  Tobacco Use  . Smoking status: Former Smoker    Packs/day: 1.00    Types: Cigarettes  . Smokeless tobacco: Never Used  Vaping Use  . Vaping Use: Every day  . Substances: Nicotine  Substance and Sexual Activity  . Alcohol use: No  . Drug use: No  . Sexual activity: Not Currently  Other Topics Concern  . Not on file  Social History Narrative  . Not on file   Social Determinants of Health   Financial Resource Strain: Not on file  Food Insecurity: Not on file  Transportation Needs: Not on file  Physical Activity: Not on file  Stress: Not on file  Social Connections: Not on file  Intimate Partner Violence: Not on file      Review of Systems  All other systems reviewed and are negative.      Objective:   Physical  Exam Vitals reviewed.  Constitutional:      General: He is not in acute distress.    Appearance: Normal appearance. He is not ill-appearing or toxic-appearing.  Cardiovascular:     Rate and Rhythm: Normal rate and regular rhythm.  Pulmonary:  Effort: Pulmonary effort is normal.     Breath sounds: Normal breath sounds.  Neurological:     Mental Status: He is alert.         Assessment & Plan:  Prostate cancer screening - Plan: PSA  ASCVD (arteriosclerotic cardiovascular disease) - Plan: CBC with Differential/Platelet, COMPLETE METABOLIC PANEL WITH GFR, Lipid panel  Polypharmacy  AAA (abdominal aortic aneurysm) without rupture ([place])  Patient has stopped smoking but unfortunately is vaping.  However he is try to reduce that.  His blood pressure today is excellent 124/68.  I will check a CBC, CMP, and lipid panel.  Given his history of cardiovascular disease, peripheral artery disease, I would recommend an LDL cholesterol less than 70.  His AAA screen is not due until next year or the year after that depending upon patient desire.  Screen for prostate cancer with a PSA.  Try Flomax 0.4 mg p.o. nightly for lower urinary tract symptoms of what I suspect is BPH

## 2021-04-04 ENCOUNTER — Other Ambulatory Visit (HOSPITAL_COMMUNITY)
Admission: RE | Admit: 2021-04-04 | Discharge: 2021-04-04 | Disposition: A | Discharge status: Home / Self Care | Payer: Medicare HMO | Source: Ambulatory Visit | Attending: Otolaryngology | Admitting: Otolaryngology

## 2021-04-04 ENCOUNTER — Encounter (HOSPITAL_BASED_OUTPATIENT_CLINIC_OR_DEPARTMENT_OTHER)
Admission: RE | Admit: 2021-04-04 | Discharge: 2021-04-04 | Disposition: A | Discharge status: Home / Self Care | Payer: Medicare HMO | Source: Ambulatory Visit | Attending: Otolaryngology | Admitting: Otolaryngology

## 2021-04-04 DIAGNOSIS — Z20822 Contact with and (suspected) exposure to covid-19: Secondary | ICD-10-CM | POA: Insufficient documentation

## 2021-04-04 DIAGNOSIS — I10 Essential (primary) hypertension: Secondary | ICD-10-CM | POA: Diagnosis not present

## 2021-04-04 DIAGNOSIS — Z9049 Acquired absence of other specified parts of digestive tract: Secondary | ICD-10-CM | POA: Diagnosis not present

## 2021-04-04 DIAGNOSIS — Z79899 Other long term (current) drug therapy: Secondary | ICD-10-CM | POA: Diagnosis not present

## 2021-04-04 DIAGNOSIS — Z7982 Long term (current) use of aspirin: Secondary | ICD-10-CM | POA: Diagnosis not present

## 2021-04-04 DIAGNOSIS — Z7902 Long term (current) use of antithrombotics/antiplatelets: Secondary | ICD-10-CM | POA: Diagnosis not present

## 2021-04-04 DIAGNOSIS — E785 Hyperlipidemia, unspecified: Secondary | ICD-10-CM | POA: Diagnosis not present

## 2021-04-04 DIAGNOSIS — Z01812 Encounter for preprocedural laboratory examination: Secondary | ICD-10-CM | POA: Insufficient documentation

## 2021-04-04 DIAGNOSIS — G4733 Obstructive sleep apnea (adult) (pediatric): Secondary | ICD-10-CM | POA: Diagnosis present

## 2021-04-04 DIAGNOSIS — I714 Abdominal aortic aneurysm, without rupture: Secondary | ICD-10-CM | POA: Diagnosis not present

## 2021-04-04 DIAGNOSIS — Z87891 Personal history of nicotine dependence: Secondary | ICD-10-CM | POA: Diagnosis not present

## 2021-04-04 DIAGNOSIS — Z8719 Personal history of other diseases of the digestive system: Secondary | ICD-10-CM | POA: Diagnosis not present

## 2021-04-04 DIAGNOSIS — I739 Peripheral vascular disease, unspecified: Secondary | ICD-10-CM | POA: Diagnosis not present

## 2021-04-04 LAB — BASIC METABOLIC PANEL
Anion gap: 5 (ref 5–15)
BUN: 6 mg/dL (ref 6–20)
CO2: 32 mmol/L (ref 22–32)
Calcium: 9.5 mg/dL (ref 8.9–10.3)
Chloride: 103 mmol/L (ref 98–111)
Creatinine, Ser: 1.19 mg/dL (ref 0.61–1.24)
GFR, Estimated: >60 mL/min (ref >60)
Glucose, Bld: 87 mg/dL (ref 70–99)
Potassium: 4.5 mmol/L (ref 3.5–5.1)
Sodium: 140 mmol/L (ref 135–145)

## 2021-04-05 LAB — SARS CORONAVIRUS 2 (TAT 6-24 HRS): SARS Coronavirus 2: NEGATIVE

## 2021-04-06 ENCOUNTER — Ambulatory Visit (HOSPITAL_BASED_OUTPATIENT_CLINIC_OR_DEPARTMENT_OTHER): Payer: Medicare HMO | Admitting: Anesthesiology

## 2021-04-06 ENCOUNTER — Encounter (HOSPITAL_BASED_OUTPATIENT_CLINIC_OR_DEPARTMENT_OTHER)
Admission: RE | Disposition: A | Discharge status: Home / Self Care | Payer: Self-pay | Source: Home / Self Care | Attending: Otolaryngology

## 2021-04-06 ENCOUNTER — Ambulatory Visit (HOSPITAL_COMMUNITY)
Admission: RE | Admit: 2021-04-06 | Discharge: 2021-04-06 | Disposition: A | Discharge status: Home / Self Care | Payer: Medicare HMO | Attending: Otolaryngology | Admitting: Otolaryngology

## 2021-04-06 ENCOUNTER — Encounter (HOSPITAL_BASED_OUTPATIENT_CLINIC_OR_DEPARTMENT_OTHER): Payer: Self-pay | Admitting: Otolaryngology

## 2021-04-06 ENCOUNTER — Other Ambulatory Visit: Payer: Self-pay

## 2021-04-06 DIAGNOSIS — Z9989 Dependence on other enabling machines and devices: Secondary | ICD-10-CM | POA: Diagnosis not present

## 2021-04-06 DIAGNOSIS — I714 Abdominal aortic aneurysm, without rupture: Secondary | ICD-10-CM | POA: Diagnosis not present

## 2021-04-06 DIAGNOSIS — Z7982 Long term (current) use of aspirin: Secondary | ICD-10-CM | POA: Diagnosis not present

## 2021-04-06 DIAGNOSIS — Z87891 Personal history of nicotine dependence: Secondary | ICD-10-CM | POA: Insufficient documentation

## 2021-04-06 DIAGNOSIS — Z9049 Acquired absence of other specified parts of digestive tract: Secondary | ICD-10-CM | POA: Insufficient documentation

## 2021-04-06 DIAGNOSIS — D638 Anemia in other chronic diseases classified elsewhere: Secondary | ICD-10-CM | POA: Diagnosis not present

## 2021-04-06 DIAGNOSIS — E039 Hypothyroidism, unspecified: Secondary | ICD-10-CM | POA: Diagnosis not present

## 2021-04-06 DIAGNOSIS — I739 Peripheral vascular disease, unspecified: Secondary | ICD-10-CM | POA: Diagnosis not present

## 2021-04-06 DIAGNOSIS — Z8719 Personal history of other diseases of the digestive system: Secondary | ICD-10-CM | POA: Diagnosis not present

## 2021-04-06 DIAGNOSIS — Z7902 Long term (current) use of antithrombotics/antiplatelets: Secondary | ICD-10-CM | POA: Insufficient documentation

## 2021-04-06 DIAGNOSIS — I10 Essential (primary) hypertension: Secondary | ICD-10-CM | POA: Insufficient documentation

## 2021-04-06 DIAGNOSIS — E785 Hyperlipidemia, unspecified: Secondary | ICD-10-CM | POA: Insufficient documentation

## 2021-04-06 DIAGNOSIS — Z79899 Other long term (current) drug therapy: Secondary | ICD-10-CM | POA: Insufficient documentation

## 2021-04-06 DIAGNOSIS — G4730 Sleep apnea, unspecified: Secondary | ICD-10-CM | POA: Diagnosis not present

## 2021-04-06 DIAGNOSIS — G4733 Obstructive sleep apnea (adult) (pediatric): Secondary | ICD-10-CM | POA: Diagnosis not present

## 2021-04-06 HISTORY — DX: Bipolar disorder, unspecified: F31.9

## 2021-04-06 HISTORY — PX: DRUG INDUCED ENDOSCOPY: SHX6808

## 2021-04-06 HISTORY — DX: Dyspnea, unspecified: R06.00

## 2021-04-06 SURGERY — DRUG INDUCED SLEEP ENDOSCOPY
Anesthesia: General | Site: Nose

## 2021-04-06 MED ORDER — FENTANYL CITRATE (PF) 100 MCG/2ML IJ SOLN: 25.0000 ug | INTRAMUSCULAR | Status: DC | PRN

## 2021-04-06 MED ORDER — OXYMETAZOLINE HCL 0.05 % NA SOLN
NASAL | Status: DC | PRN
  Administered 2021-04-06: 1 via TOPICAL

## 2021-04-06 MED ORDER — PROPOFOL 500 MG/50ML IV EMUL
INTRAVENOUS | Status: DC | PRN
  Administered 2021-04-06: 100 ug/kg/min via INTRAVENOUS

## 2021-04-06 MED ORDER — PROPOFOL 10 MG/ML IV BOLUS
INTRAVENOUS | Status: DC | PRN
  Administered 2021-04-06: 10 mg via INTRAVENOUS

## 2021-04-06 MED ORDER — LACTATED RINGERS IV SOLN: INTRAVENOUS | Status: DC | PRN

## 2021-04-06 MED ORDER — LACTATED RINGERS IV SOLN: INTRAVENOUS | Status: DC

## 2021-04-06 SURGICAL SUPPLY — 13 items
CANISTER SUCT 1200ML W/VALVE (MISCELLANEOUS) ×2 IMPLANT
COVER WAND RF STERILE (DRAPES) IMPLANT
GLOVE SURG ENC MOIS LTX SZ7.5 (GLOVE) ×2 IMPLANT
KIT CLEAN ENDO (MISCELLANEOUS) ×2 IMPLANT
NEEDLE PRECISIONGLIDE 27X1.5 (NEEDLE) IMPLANT
PACK BASIN DAY SURGERY FS (CUSTOM PROCEDURE TRAY) IMPLANT
PATTIES SURGICAL .5 X3 (DISPOSABLE) ×2 IMPLANT
SHEET MEDIUM DRAPE 40X70 STRL (DRAPES) IMPLANT
SOL ANTI FOG 6CC (MISCELLANEOUS) ×1 IMPLANT
SOLUTION ANTI FOG 6CC (MISCELLANEOUS) ×1
SYR CONTROL 10ML LL (SYRINGE) IMPLANT
TOWEL GREEN STERILE FF (TOWEL DISPOSABLE) ×2 IMPLANT
TUBE CONNECTING 20X1/4 (TUBING) ×2 IMPLANT

## 2021-04-06 NOTE — Transfer of Care (Signed)
Immediate Anesthesia Transfer of Care Note  Patient: Tony Campos  Procedure(s) Performed: DRUG INDUCED ENDOSCOPY (N/A Nose)  Patient Location: PACU  Anesthesia Type:General  Level of Consciousness: awake, alert  and oriented  Airway & Oxygen Therapy: Patient Spontanous Breathing and Patient connected to nasal cannula oxygen  Post-op Assessment: Report given to RN and Post -op Vital signs reviewed and stable  Post vital signs: Reviewed and stable  Last Vitals:  Vitals Value Taken Time  BP    Temp    Pulse 59 04/06/21 0934  Resp    SpO2 90 % 04/06/21 0934  Vitals shown include unvalidated device data.  Last Pain:  Vitals:   04/06/21 0832  TempSrc: Oral  PainSc: 0-No pain      Patients Stated Pain Goal: 3 (39/03/00 9233)  Complications: No complications documented.

## 2021-04-06 NOTE — H&P (Signed)
Tony Campos is an 55 y.o. male.   Chief Complaint: Sleep apnea HPI: 55 year old male with obstructive sleep apnea who has not been able to tolerate CPAP.  He presents for sleep endoscopy.  Past Medical History:  Diagnosis Date  . AAA (abdominal aortic aneurysm) without rupture (HCC)    3.4 cm found on mri 2/20  . Acid reflux   . Anemia   . Anxiety   . Aortic atherosclerosis ([place])   . Bipolar disorder ([place])   . Depression   . Diverticulitis   . Dyspnea    exertional  . Genital warts   . Hyperlipidemia   . Hypertension   . Hypothyroidism   . Meningitis   . PAD (peripheral artery disease) (HCC)    femoral artery angioplasy/stent  . SIADH (syndrome of inappropriate ADH production) ([place])   . Sleep apnea 12/2015  . Smoker   . Smoker   . Spondylosis   . Testosterone deficiency     Past Surgical History:  Procedure Laterality Date  . APPENDECTOMY  12/2018   at [facility]  . CHOLECYSTECTOMY  2007  . CLOSED REDUCTION MANDIBLE N/A 03/09/2018   Procedure: CLOSED REDUCTION MANDIBULAR MMF;  Surgeon: Jerrell Belfast, MD;  Location: [facility];  Service: ENT;  Laterality: N/A;  . COLONOSCOPY  07/26/2020   10/22/2018  . KNEE SURGERY     right  . LAPAROSCOPIC APPENDECTOMY N/A 07/25/2018   Procedure: APPENDECTOMY LAPAROSCOPIC;  Surgeon: Aviva Signs, MD;  Location: [facility];  Service: General;  Laterality: N/A;  . MANDIBULAR HARDWARE REMOVAL N/A 05/06/2018   Procedure: REMOVAL OF MMF HARDWARE;  Surgeon: Jerrell Belfast, MD;  Location: [place];  Service: ENT;  Laterality: N/A;  . NASAL SEPTUM SURGERY    . NASAL SINUS SURGERY    . POLYPECTOMY    . right sfa recanalization and stenting      [facility] 12/2015  . UPPER GASTROINTESTINAL ENDOSCOPY  07/26/2020  . WRIST SURGERY     right and left    Family History  Problem Relation Age of Onset  . Other Father        killed in [place]  . Cancer Maternal Grandfather        stomach  . Stomach cancer Maternal Grandfather    . Colon cancer Neg Hx   . Colitis Neg Hx   . Esophageal cancer Neg Hx   . Rectal cancer Neg Hx    Social History:  reports that he has quit smoking. His smoking use included cigarettes. He smoked 1.00 pack per day. He has never used smokeless tobacco. He reports that he does not drink alcohol and does not use drugs.  Allergies: No Known Allergies  Medications Prior to Admission  Medication Sig Dispense Refill  . amphetamine-dextroamphetamine (ADDERALL) 30 MG tablet Take 30 mg by mouth daily.    Marland Kitchen aspirin EC 81 MG tablet Take 81 mg by mouth daily.    . baclofen (LIORESAL) 10 MG tablet Take 10 mg by mouth 4 (four) times daily.    . clonazePAM (KLONOPIN) 1 MG tablet Take 1 mg by mouth 2 (two) times daily.    . DULoxetine (CYMBALTA) 60 MG capsule Take 60 mg by mouth daily.    . fentaNYL (DURAGESIC - DOSED MCG/HR) 50 MCG/HR Place 50 mcg onto the skin every 3 (three) days.   0  . ferrous sulfate 325 (65 FE) MG EC tablet Take 325 mg by mouth 3 (three) times daily with meals.    Marland Kitchen  furosemide (LASIX) 40 MG tablet TAKE 1 TABLET BY MOUTH TWICE DAILY AS NEEDED. (Patient taking differently: as needed. TAKE 1 TABLET BY MOUTH TWICE DAILY AS NEEDED.) 60 tablet 0  . lamoTRIgine (LAMICTAL) 200 MG tablet Take 200 mg by mouth daily.    Marland Kitchen levothyroxine (SYNTHROID) 125 MCG tablet TAKE 1 TABLET BY MOUTH DAILY BEFORE BREAKFAST. 90 tablet 0  . losartan-hydrochlorothiazide (HYZAAR) 50-12.5 MG tablet Take 1 tablet by mouth daily.    . metoprolol tartrate (LOPRESSOR) 25 MG tablet TAKE (1) TABLET TWICE DAILY. 60 tablet 0  . morphine (MSIR) 15 MG tablet Take 15 mg by mouth 2 (two) times daily.    . naloxegol oxalate (MOVANTIK) 25 MG TABS tablet Take 1 tablet (25 mg total) by mouth daily. 90 tablet 3  . pantoprazole (PROTONIX) 40 MG tablet TAKE (1) TABLET TWICE DAILY. 180 tablet 3  . pregabalin (LYRICA) 150 MG capsule Take 150 mg by mouth 2 (two) times daily.    Marland Kitchen REXULTI 1 MG TABS tablet Take 1 mg by mouth daily.     . rosuvastatin (CRESTOR) 40 MG tablet TAKE 1 TABLET DAILY. 30 tablet 0  . tamsulosin (FLOMAX) 0.4 MG CAPS capsule Take 1 capsule (0.4 mg total) by mouth daily. 30 capsule 3  . valACYclovir (VALTREX) 500 MG tablet TAKE 1 TABLET BY MOUTH ONCE DAILY. 30 tablet 0  . vitamin E 1000 UNIT capsule Take 1,000 Units by mouth daily.    Marland Kitchen NARCAN 4 MG/0.1ML LIQD nasal spray kit SMARTSIG:1 Spray(s) Both Nares Once PRN      Results for orders placed or performed during the hospital encounter of 04/06/21 (from the past 48 hour(s))  Basic metabolic panel per protocol     Status: None   Collection Time: 04/04/21 11:23 AM  Result Value Ref Range   Sodium 140 135 - 145 mmol/L   Potassium 4.5 3.5 - 5.1 mmol/L   Chloride 103 98 - 111 mmol/L   CO2 32 22 - 32 mmol/L   Glucose, Bld 87 70 - 99 mg/dL    Comment: Glucose reference range applies only to samples taken after fasting for at least 8 hours.   BUN 6 6 - 20 mg/dL   Creatinine, Ser 1.19 0.61 - 1.24 mg/dL   Calcium 9.5 8.9 - 10.3 mg/dL   GFR, Estimated >60 >60 mL/min    Comment: (NOTE) Calculated using the CKD-EPI Creatinine Equation (2021)    Anion gap 5 5 - 15    Comment: Performed at [facility] [address] [place], [place] [address]   No results found.  Review of Systems  All other systems reviewed and are negative.   Height 6' (1.829 m), weight 117.9 kg. Physical Exam Constitutional:      Appearance: Normal appearance. He is normal weight.  HENT:     Head: Normocephalic and atraumatic.     Right Ear: External ear normal.     Left Ear: External ear normal.     Nose: Nose normal.     Mouth/Throat:     Mouth: Mucous membranes are moist.     Pharynx: Oropharynx is clear.  Eyes:     Extraocular Movements: Extraocular movements intact.     Conjunctiva/sclera: Conjunctivae normal.     Pupils: Pupils are equal, round, and reactive to light.  Cardiovascular:     Rate and Rhythm: Normal rate.  Pulmonary:     Effort: Pulmonary  effort is normal.  Musculoskeletal:     Cervical back:  Normal range of motion.  Skin:    General: Skin is warm and dry.  Neurological:     General: No focal deficit present.     Mental Status: He is alert and oriented to person, place, and time.  Psychiatric:        Mood and Affect: Mood normal.        Behavior: Behavior normal.        Thought Content: Thought content normal.        Judgment: Judgment normal.      Assessment/Plan Sleep apnea  To OR for sleep endoscopy.  Melida Quitter, MD 04/06/2021, 8:31 AM

## 2021-04-06 NOTE — Brief Op Note (Signed)
04/06/2021  9:32 AM  PATIENT:  Tony Campos  55 y.o. male  PRE-OPERATIVE DIAGNOSIS:  Sleep apnea  POST-OPERATIVE DIAGNOSIS:  Sleep apnea  PROCEDURE:  Procedure(s): DRUG INDUCED ENDOSCOPY (N/A)  SURGEON:  Surgeon(s) and Role:    Melida Quitter, MD - Primary  PHYSICIAN ASSISTANT:   ASSISTANTS: none   ANESTHESIA:   IV sedation  EBL: None  BLOOD ADMINISTERED:none  DRAINS: none   LOCAL MEDICATIONS USED:  NONE  SPECIMEN:  No Specimen  DISPOSITION OF SPECIMEN:  N/A  COUNTS:  YES  TOURNIQUET:  * No tourniquets in log *  DICTATION: .Note written in EPIC  PLAN OF CARE: Discharge to home after PACU  PATIENT DISPOSITION:  PACU - hemodynamically stable.   Delay start of Pharmacological VTE agent (>24hrs) due to surgical blood loss or risk of bleeding: no

## 2021-04-06 NOTE — Anesthesia Postprocedure Evaluation (Signed)
Anesthesia Post Note  Patient: Tony Campos  Procedure(s) Performed: DRUG INDUCED ENDOSCOPY (N/A Nose)     Patient location during evaluation: PACU Anesthesia Type: General Level of consciousness: awake and alert Pain management: pain level controlled Vital Signs Assessment: post-procedure vital signs reviewed and stable Respiratory status: spontaneous breathing, nonlabored ventilation, respiratory function stable and patient connected to nasal cannula oxygen Cardiovascular status: blood pressure returned to baseline and stable Postop Assessment: no apparent nausea or vomiting Anesthetic complications: no   No complications documented.  Last Vitals:  Vitals:   04/06/21 0945 04/06/21 0954  BP: 127/75 (!) 142/91  Pulse: 60 62  Resp: 19 16  Temp:  36.7 C  SpO2: 93% 97%    Last Pain:  Vitals:   04/06/21 0954  TempSrc:   PainSc: 0-No pain                 Corin Tilly L Yarelli Decelles

## 2021-04-06 NOTE — Op Note (Signed)
Preop diagnosis: Obstructive sleep apnea Postop diagnosis: same Procedure: Drug-induced sleep endoscopy Surgeon: Redmond Baseman Anesth: IV sedation Compl: None Findings: There is 75% anterior-posterior collapse at the genu of the velum making him a candidate for hypoglossal nerve stimulator placement.  There was more circumferential collapse inferiorly including at the tongue base level. Description:  After discussing risks, benefits, and alternatives, the patient was brought to the operative suite and placed on the operative table in the supine position.  Anesthesia was induced and the patient was given light sedation to simulate natural sleep. When the proper level was reached, an Afrin-soaked pledget was placed in the right nasal passage for a couple of minutes and then removed.  The fiberoptic laryngoscope was then passed to view the pharynx and larynx.  Findings are noted above and the exam was recorded.  After completion, the scope was removed and the patient was returned to anesthesia for wakeup and was moved to the recovery room in stable condition.

## 2021-04-06 NOTE — Discharge Instructions (Signed)

## 2021-04-06 NOTE — Anesthesia Preprocedure Evaluation (Addendum)
Anesthesia Evaluation  Patient identified by MRN, date of birth, ID band Patient awake    Reviewed: Allergy & Precautions, NPO status , Patient's Chart, lab work & pertinent test results, reviewed documented beta blocker date and time   Airway Mallampati: II  TM Distance: >3 FB Neck ROM: Full    Dental  (+) Poor Dentition, Dental Advisory Given,    Pulmonary sleep apnea , Patient abstained from smoking., former smoker,    Pulmonary exam normal breath sounds clear to auscultation       Cardiovascular hypertension, Pt. on home beta blockers and Pt. on medications + Peripheral Vascular Disease (3.4cm AAA, also s/p right SFA stenting)  Normal cardiovascular exam Rhythm:Regular Rate:Normal  TTE 2014 Normal EF, valves ok   Neuro/Psych  Headaches, PSYCHIATRIC DISORDERS Anxiety Depression Bipolar Disorder    GI/Hepatic Neg liver ROS, GERD  Controlled and Medicated,  Endo/Other  Hypothyroidism   Renal/GU negative Renal ROS  negative genitourinary   Musculoskeletal  (+) Arthritis , narcotic dependent  Abdominal   Peds  Hematology negative hematology ROS (+)   Anesthesia Other Findings On fentanyl patch 40mcg/hr, not currently wearing one  Reproductive/Obstetrics                            Anesthesia Physical Anesthesia Plan  ASA: III  Anesthesia Plan: General   Post-op Pain Management:    Induction: Intravenous  PONV Risk Score and Plan: 2 and Propofol infusion and Treatment may vary due to age or medical condition  Airway Management Planned: Natural Airway and Mask  Additional Equipment:   Intra-op Plan:   Post-operative Plan:   Informed Consent: I have reviewed the patients History and Physical, chart, labs and discussed the procedure including the risks, benefits and alternatives for the proposed anesthesia with the patient or authorized representative who has indicated his/her  understanding and acceptance.     Dental advisory given  Plan Discussed with: CRNA  Anesthesia Plan Comments:        Anesthesia Quick Evaluation

## 2021-04-07 ENCOUNTER — Encounter (HOSPITAL_BASED_OUTPATIENT_CLINIC_OR_DEPARTMENT_OTHER): Payer: Self-pay | Admitting: Otolaryngology

## 2021-04-11 ENCOUNTER — Other Ambulatory Visit: Payer: Self-pay | Admitting: Family Medicine

## 2021-04-11 DIAGNOSIS — G894 Chronic pain syndrome: Secondary | ICD-10-CM | POA: Diagnosis not present

## 2021-04-11 DIAGNOSIS — M62830 Muscle spasm of back: Secondary | ICD-10-CM | POA: Diagnosis not present

## 2021-04-11 DIAGNOSIS — M47812 Spondylosis without myelopathy or radiculopathy, cervical region: Secondary | ICD-10-CM | POA: Diagnosis not present

## 2021-04-11 DIAGNOSIS — M47816 Spondylosis without myelopathy or radiculopathy, lumbar region: Secondary | ICD-10-CM | POA: Diagnosis not present

## 2021-04-27 ENCOUNTER — Other Ambulatory Visit: Payer: Self-pay | Admitting: Gastroenterology

## 2021-05-06 ENCOUNTER — Other Ambulatory Visit: Payer: Self-pay | Admitting: Family Medicine

## 2021-05-11 DIAGNOSIS — M62830 Muscle spasm of back: Secondary | ICD-10-CM | POA: Diagnosis not present

## 2021-05-11 DIAGNOSIS — G894 Chronic pain syndrome: Secondary | ICD-10-CM | POA: Diagnosis not present

## 2021-05-11 DIAGNOSIS — M47816 Spondylosis without myelopathy or radiculopathy, lumbar region: Secondary | ICD-10-CM | POA: Diagnosis not present

## 2021-05-11 DIAGNOSIS — M47812 Spondylosis without myelopathy or radiculopathy, cervical region: Secondary | ICD-10-CM | POA: Diagnosis not present

## 2021-05-16 ENCOUNTER — Other Ambulatory Visit: Payer: Self-pay | Admitting: Otolaryngology

## 2021-05-24 ENCOUNTER — Other Ambulatory Visit: Payer: Self-pay | Admitting: Family Medicine

## 2021-05-24 DIAGNOSIS — R000 Tachycardia, unspecified: Secondary | ICD-10-CM

## 2021-06-01 DIAGNOSIS — L723 Sebaceous cyst: Secondary | ICD-10-CM | POA: Diagnosis not present

## 2021-06-01 DIAGNOSIS — Z85828 Personal history of other malignant neoplasm of skin: Secondary | ICD-10-CM | POA: Diagnosis not present

## 2021-06-14 ENCOUNTER — Telehealth: Payer: Self-pay | Admitting: Family Medicine

## 2021-06-14 ENCOUNTER — Other Ambulatory Visit: Payer: Self-pay

## 2021-06-14 ENCOUNTER — Other Ambulatory Visit: Payer: Self-pay | Admitting: Gastroenterology

## 2021-06-14 MED ORDER — PANTOPRAZOLE SODIUM 40 MG PO TBEC: DELAYED_RELEASE_TABLET | ORAL | 3 refills | Status: DC

## 2021-06-14 NOTE — Telephone Encounter (Signed)
Patient called to request refill of pantoprazole (PROTONIX) 40 MG tablet [[id number]]  Refill declined by pharmacy; patient told appt needed with provider. Patient took last pill 7/18. Requesting refill or enough pills to last until next appt on 7/28.   Pharmacy confirmed as   [facility], Stuckey Fowler  [address] [place], [place] [address]  Phone:  [phone]  Fax:  [phone]    Please advise at [phone].

## 2021-06-22 DIAGNOSIS — M62830 Muscle spasm of back: Secondary | ICD-10-CM | POA: Diagnosis not present

## 2021-06-22 DIAGNOSIS — M47816 Spondylosis without myelopathy or radiculopathy, lumbar region: Secondary | ICD-10-CM | POA: Diagnosis not present

## 2021-06-22 DIAGNOSIS — G894 Chronic pain syndrome: Secondary | ICD-10-CM | POA: Diagnosis not present

## 2021-06-22 DIAGNOSIS — M47812 Spondylosis without myelopathy or radiculopathy, cervical region: Secondary | ICD-10-CM | POA: Diagnosis not present

## 2021-06-23 ENCOUNTER — Other Ambulatory Visit: Payer: Self-pay

## 2021-06-23 ENCOUNTER — Encounter: Payer: Self-pay | Admitting: Family Medicine

## 2021-06-23 ENCOUNTER — Ambulatory Visit (INDEPENDENT_AMBULATORY_CARE_PROVIDER_SITE_OTHER): Payer: Medicare HMO | Admitting: Family Medicine

## 2021-06-23 VITALS — BP 118/74 | HR 82 | Temp 98.1°F | Resp 14 | Ht 72.0 in | Wt 240.0 lb

## 2021-06-23 DIAGNOSIS — Z23 Encounter for immunization: Secondary | ICD-10-CM

## 2021-06-23 DIAGNOSIS — E039 Hypothyroidism, unspecified: Secondary | ICD-10-CM

## 2021-06-23 MED ORDER — PANTOPRAZOLE SODIUM 40 MG PO TBEC: DELAYED_RELEASE_TABLET | ORAL | 3 refills | Status: DC

## 2021-06-23 NOTE — Addendum Note (Signed)
Addended by: Sheral Flow on: 06/23/2021 03:07 PM   Modules accepted: Orders

## 2021-06-23 NOTE — Progress Notes (Signed)
Subjective:    Patient ID: Tony Campos, male    DOB: 10-Jan-1966, 55 y.o.   MRN: [id number]  Patient is here today to check a TSH.  He is on levothyroxine for hypothyroidism and his last TSH was over a year ago.  He is due for a booster on Pneumovax 23 as he continues to smoke.  His last colonoscopy was in 2021 and due to the number of polyps they recommended a repeat colonoscopy in 2023.  His last AAA ultrasound was in 2021 and they recommended a repeat picture in 3 years.  He states that the Flomax helps some with the urinary retention and dribbling but the symptoms are still present.  He denies any orthostatic dizziness  Past Medical History:  Diagnosis Date   AAA (abdominal aortic aneurysm) without rupture (HCC)    3.4 cm found on mri 2/20   Acid reflux    Anemia    Anxiety    Aortic atherosclerosis (HCC)    Bipolar disorder (HCC)    Depression    Diverticulitis    Dyspnea    exertional   Genital warts    Hyperlipidemia    Hypertension    Hypothyroidism    Meningitis    PAD (peripheral artery disease) ([place])    femoral artery angioplasy/stent   SIADH (syndrome of inappropriate ADH production) ([place])    Sleep apnea 12/2015   Smoker    Smoker    Spondylosis    Testosterone deficiency    Past Surgical History:  Procedure Laterality Date   APPENDECTOMY  12/2018   at [place]  2007   CLOSED REDUCTION MANDIBLE N/A 03/09/2018   Procedure: CLOSED REDUCTION MANDIBULAR MMF;  Surgeon: Jerrell Belfast, MD;  Location: [facility];  Service: ENT;  Laterality: N/A;   COLONOSCOPY  07/26/2020   10/22/2018   DRUG INDUCED ENDOSCOPY N/A 04/06/2021   Procedure: DRUG INDUCED ENDOSCOPY;  Surgeon: Melida Quitter, MD;  Location: [facility];  Service: ENT;  Laterality: N/A;   KNEE SURGERY     right   LAPAROSCOPIC APPENDECTOMY N/A 07/25/2018   Procedure: APPENDECTOMY LAPAROSCOPIC;  Surgeon: Aviva Signs, MD;  Location: [facility];  Service: General;  Laterality: N/A;    MANDIBULAR HARDWARE REMOVAL N/A 05/06/2018   Procedure: REMOVAL OF MMF HARDWARE;  Surgeon: Jerrell Belfast, MD;  Location: [facility];  Service: ENT;  Laterality: N/A;   [place]     POLYPECTOMY     right sfa recanalization and stenting      [facility] 12/2015   UPPER GASTROINTESTINAL ENDOSCOPY  07/26/2020   WRIST SURGERY     right and left   Current Outpatient Medications on File Prior to Visit  Medication Sig Dispense Refill   amphetamine-dextroamphetamine (ADDERALL) 30 MG tablet Take 30 mg by mouth daily.     aspirin EC 81 MG tablet Take 81 mg by mouth daily.     baclofen (LIORESAL) 10 MG tablet Take 10 mg by mouth 4 (four) times daily.     clonazePAM (KLONOPIN) 1 MG tablet Take 1 mg by mouth 2 (two) times daily.     DULoxetine (CYMBALTA) 60 MG capsule Take 60 mg by mouth daily.     fentaNYL (DURAGESIC - DOSED MCG/HR) 50 MCG/HR Place 50 mcg onto the skin every 3 (three) days.   0   ferrous sulfate 325 (65 FE) MG EC tablet Take 325 mg by mouth 3 (  three) times daily with meals.     furosemide (LASIX) 40 MG tablet TAKE 1 TABLET BY MOUTH TWICE DAILY AS NEEDED. (Patient taking differently: as needed. TAKE 1 TABLET BY MOUTH TWICE DAILY AS NEEDED.) 60 tablet 0   lamoTRIgine (LAMICTAL) 200 MG tablet Take 200 mg by mouth daily.     levothyroxine (SYNTHROID) 125 MCG tablet TAKE 1 TABLET BY MOUTH DAILY BEFORE BREAKFAST. 90 tablet 0   losartan-hydrochlorothiazide (HYZAAR) 50-12.5 MG tablet Take 1 tablet by mouth daily.     metoprolol tartrate (LOPRESSOR) 25 MG tablet TAKE (1) TABLET TWICE DAILY. 60 tablet 0   morphine (MS CONTIN) 30 MG 12 hr tablet Take 30 mg by mouth 2 (two) times daily as needed.     pantoprazole (PROTONIX) 40 MG tablet TAKE (1) TABLET TWICE DAILY. 180 tablet 3   pregabalin (LYRICA) 150 MG capsule Take 150 mg by mouth 2 (two) times daily.     rosuvastatin (CRESTOR) 40 MG tablet TAKE 1 TABLET DAILY. 30 tablet 3   tamsulosin  (FLOMAX) 0.4 MG CAPS capsule Take 1 capsule (0.4 mg total) by mouth daily. 30 capsule 3   valACYclovir (VALTREX) 500 MG tablet TAKE 1 TABLET BY MOUTH ONCE DAILY. 30 tablet 3   vitamin E 1000 UNIT capsule Take 1,000 Units by mouth daily.     morphine (MSIR) 15 MG tablet Take 15 mg by mouth 2 (two) times daily. (Patient not taking: Reported on 06/23/2021)     NARCAN 4 MG/0.1ML LIQD nasal spray kit SMARTSIG:1 Spray(s) Both Nares Once PRN (Patient not taking: Reported on 06/23/2021)     [DISCONTINUED] esomeprazole (NEXIUM) 40 MG capsule Take 40 mg by mouth daily before breakfast.     No current facility-administered medications on file prior to visit.    No Known Allergies Social History   Socioeconomic History   Marital status: Married    Spouse name: Not on file   Number of children: 0   Years of education: Not on file   Highest education level: Not on file  Occupational History   Not on file  Tobacco Use   Smoking status: Former    Packs/day: 1.00    Types: Cigarettes   Smokeless tobacco: Never  Vaping Use   Vaping Use: Every day   Substances: Nicotine  Substance and Sexual Activity   Alcohol use: No   Drug use: No   Sexual activity: Not Currently  Other Topics Concern   Not on file  Social History Narrative   Not on file   Social Determinants of Health   Financial Resource Strain: Not on file  Food Insecurity: Not on file  Transportation Needs: Not on file  Physical Activity: Not on file  Stress: Not on file  Social Connections: Not on file  Intimate Partner Violence: Not on file      Review of Systems  All other systems reviewed and are negative.     Objective:   Physical Exam Vitals reviewed.  Constitutional:      General: He is not in acute distress.    Appearance: Normal appearance. He is not ill-appearing or toxic-appearing.  Cardiovascular:     Rate and Rhythm: Normal rate and regular rhythm.  Pulmonary:     Effort: Pulmonary effort is normal.      Breath sounds: Normal breath sounds.  Neurological:     Mental Status: He is alert.        Assessment & Plan:  Acquired hypothyroidism - Plan: TSH Check TSH  while the patient is here to ensure adequate replacement with levothyroxine.  Recommended increasing Flomax to 0.8 mg p.o. nightly.  The remainder of his preventative care is up-to-date.  Patient received Pneumovax 23 today as a booster

## 2021-06-24 LAB — TSH: TSH: 0.78 mIU/L (ref 0.40–4.50)

## 2021-07-12 ENCOUNTER — Encounter (HOSPITAL_BASED_OUTPATIENT_CLINIC_OR_DEPARTMENT_OTHER): Payer: Self-pay | Admitting: Otolaryngology

## 2021-07-12 ENCOUNTER — Other Ambulatory Visit: Payer: Self-pay

## 2021-07-12 DIAGNOSIS — G4733 Obstructive sleep apnea (adult) (pediatric): Secondary | ICD-10-CM | POA: Diagnosis not present

## 2021-07-14 ENCOUNTER — Encounter (HOSPITAL_BASED_OUTPATIENT_CLINIC_OR_DEPARTMENT_OTHER)
Admission: RE | Admit: 2021-07-14 | Discharge: 2021-07-14 | Disposition: A | Discharge status: Home / Self Care | Payer: Medicare HMO | Source: Ambulatory Visit | Attending: Otolaryngology | Admitting: Otolaryngology

## 2021-07-14 DIAGNOSIS — Z01812 Encounter for preprocedural laboratory examination: Secondary | ICD-10-CM | POA: Diagnosis not present

## 2021-07-14 DIAGNOSIS — F4312 Post-traumatic stress disorder, chronic: Secondary | ICD-10-CM | POA: Diagnosis not present

## 2021-07-14 DIAGNOSIS — R69 Illness, unspecified: Secondary | ICD-10-CM | POA: Diagnosis not present

## 2021-07-14 DIAGNOSIS — G4730 Sleep apnea, unspecified: Secondary | ICD-10-CM | POA: Diagnosis not present

## 2021-07-14 DIAGNOSIS — G47419 Narcolepsy without cataplexy: Secondary | ICD-10-CM | POA: Diagnosis not present

## 2021-07-14 LAB — BASIC METABOLIC PANEL
Anion gap: 7 (ref 5–15)
BUN: 9 mg/dL (ref 6–20)
CO2: 33 mmol/L — ABNORMAL HIGH (ref 22–32)
Calcium: 9.2 mg/dL (ref 8.9–10.3)
Chloride: 100 mmol/L (ref 98–111)
Creatinine, Ser: 1.13 mg/dL (ref 0.61–1.24)
GFR, Estimated: >60 mL/min (ref >60)
Glucose, Bld: 111 mg/dL — ABNORMAL HIGH (ref 70–99)
Potassium: 4.2 mmol/L (ref 3.5–5.1)
Sodium: 140 mmol/L (ref 135–145)

## 2021-07-14 MED ORDER — CHLORHEXIDINE GLUCONATE CLOTH 2 % EX PADS: 6.0000 | MEDICATED_PAD | Freq: Once | CUTANEOUS | Status: DC

## 2021-07-14 NOTE — Progress Notes (Signed)
Surgical soap given to patient, instructions given, patient verbalized understanding.

## 2021-07-19 ENCOUNTER — Other Ambulatory Visit: Payer: Self-pay | Admitting: Family Medicine

## 2021-07-20 ENCOUNTER — Encounter (HOSPITAL_BASED_OUTPATIENT_CLINIC_OR_DEPARTMENT_OTHER)
Admission: RE | Disposition: A | Discharge status: Home / Self Care | Payer: Self-pay | Source: Home / Self Care | Attending: Otolaryngology

## 2021-07-20 ENCOUNTER — Ambulatory Visit (HOSPITAL_COMMUNITY): Payer: Medicare HMO

## 2021-07-20 ENCOUNTER — Ambulatory Visit (HOSPITAL_BASED_OUTPATIENT_CLINIC_OR_DEPARTMENT_OTHER)
Admission: RE | Admit: 2021-07-20 | Discharge: 2021-07-20 | Disposition: A | Discharge status: Home / Self Care | Payer: Medicare HMO | Attending: Otolaryngology | Admitting: Otolaryngology

## 2021-07-20 ENCOUNTER — Ambulatory Visit (HOSPITAL_BASED_OUTPATIENT_CLINIC_OR_DEPARTMENT_OTHER): Payer: Medicare HMO | Admitting: Certified Registered"

## 2021-07-20 ENCOUNTER — Encounter (HOSPITAL_BASED_OUTPATIENT_CLINIC_OR_DEPARTMENT_OTHER): Payer: Self-pay | Admitting: Otolaryngology

## 2021-07-20 ENCOUNTER — Other Ambulatory Visit: Payer: Self-pay

## 2021-07-20 DIAGNOSIS — E785 Hyperlipidemia, unspecified: Secondary | ICD-10-CM | POA: Diagnosis not present

## 2021-07-20 DIAGNOSIS — Z79899 Other long term (current) drug therapy: Secondary | ICD-10-CM | POA: Diagnosis not present

## 2021-07-20 DIAGNOSIS — K219 Gastro-esophageal reflux disease without esophagitis: Secondary | ICD-10-CM | POA: Diagnosis not present

## 2021-07-20 DIAGNOSIS — J811 Chronic pulmonary edema: Secondary | ICD-10-CM | POA: Diagnosis not present

## 2021-07-20 DIAGNOSIS — Z79890 Hormone replacement therapy (postmenopausal): Secondary | ICD-10-CM | POA: Insufficient documentation

## 2021-07-20 DIAGNOSIS — I10 Essential (primary) hypertension: Secondary | ICD-10-CM | POA: Diagnosis not present

## 2021-07-20 DIAGNOSIS — Z969 Presence of functional implant, unspecified: Secondary | ICD-10-CM | POA: Diagnosis not present

## 2021-07-20 DIAGNOSIS — Z6831 Body mass index (BMI) 31.0-31.9, adult: Secondary | ICD-10-CM | POA: Diagnosis not present

## 2021-07-20 DIAGNOSIS — G4733 Obstructive sleep apnea (adult) (pediatric): Secondary | ICD-10-CM | POA: Insufficient documentation

## 2021-07-20 DIAGNOSIS — F17210 Nicotine dependence, cigarettes, uncomplicated: Secondary | ICD-10-CM | POA: Diagnosis not present

## 2021-07-20 DIAGNOSIS — J9811 Atelectasis: Secondary | ICD-10-CM | POA: Diagnosis not present

## 2021-07-20 DIAGNOSIS — Z7982 Long term (current) use of aspirin: Secondary | ICD-10-CM | POA: Diagnosis not present

## 2021-07-20 DIAGNOSIS — R69 Illness, unspecified: Secondary | ICD-10-CM | POA: Diagnosis not present

## 2021-07-20 HISTORY — PX: IMPLANTATION OF HYPOGLOSSAL NERVE STIMULATOR: SHX6827

## 2021-07-20 SURGERY — INSERTION, HYPOGLOSSAL NERVE STIMULATOR
Anesthesia: General | Site: Chest | Laterality: Right

## 2021-07-20 MED ORDER — LIDOCAINE HCL (PF) 2 % IJ SOLN
INTRAMUSCULAR | Status: AC
  Filled 2021-07-20: qty 15

## 2021-07-20 MED ORDER — MEPERIDINE HCL 25 MG/ML IJ SOLN: 6.2500 mg | INTRAMUSCULAR | Status: DC | PRN

## 2021-07-20 MED ORDER — PHENYLEPHRINE HCL (PRESSORS) 10 MG/ML IV SOLN
INTRAVENOUS | Status: DC | PRN
  Administered 2021-07-20: 40 ug via INTRAVENOUS

## 2021-07-20 MED ORDER — HYDROMORPHONE HCL 1 MG/ML IJ SOLN
0.2500 mg | INTRAMUSCULAR | Status: DC | PRN
  Administered 2021-07-20 (×2): 0.25 mg via INTRAVENOUS

## 2021-07-20 MED ORDER — SUCCINYLCHOLINE CHLORIDE 200 MG/10ML IV SOSY
PREFILLED_SYRINGE | INTRAVENOUS | Status: DC | PRN
  Administered 2021-07-20: 60 mg via INTRAVENOUS

## 2021-07-20 MED ORDER — 0.9 % SODIUM CHLORIDE (POUR BTL) OPTIME
TOPICAL | Status: DC | PRN
  Administered 2021-07-20: 200 mL

## 2021-07-20 MED ORDER — AMISULPRIDE (ANTIEMETIC) 5 MG/2ML IV SOLN: 10.0000 mg | Freq: Once | INTRAVENOUS | Status: DC | PRN

## 2021-07-20 MED ORDER — LIDOCAINE-EPINEPHRINE 1 %-1:100000 IJ SOLN
INTRAMUSCULAR | Status: DC | PRN
  Administered 2021-07-20: 7 mL

## 2021-07-20 MED ORDER — NEOSTIGMINE METHYLSULFATE 3 MG/3ML IV SOSY
PREFILLED_SYRINGE | INTRAVENOUS | Status: AC
  Filled 2021-07-20: qty 3

## 2021-07-20 MED ORDER — HYDROMORPHONE HCL 1 MG/ML IJ SOLN
INTRAMUSCULAR | Status: AC
  Filled 2021-07-20: qty 0.5

## 2021-07-20 MED ORDER — GLYCOPYRROLATE PF 0.2 MG/ML IJ SOSY
PREFILLED_SYRINGE | INTRAMUSCULAR | Status: AC
  Filled 2021-07-20: qty 2

## 2021-07-20 MED ORDER — ONDANSETRON HCL 4 MG/2ML IJ SOLN
INTRAMUSCULAR | Status: DC | PRN
  Administered 2021-07-20: 4 mg via INTRAVENOUS

## 2021-07-20 MED ORDER — OXYCODONE HCL 5 MG PO TABS
5.0000 mg | ORAL_TABLET | Freq: Once | ORAL | Status: DC | PRN
Start: 2021-07-20 — End: 2021-07-20

## 2021-07-20 MED ORDER — CEFAZOLIN SODIUM-DEXTROSE 2-4 GM/100ML-% IV SOLN: 2.0000 g | INTRAVENOUS | Status: DC

## 2021-07-20 MED ORDER — PROPOFOL 10 MG/ML IV BOLUS
INTRAVENOUS | Status: AC
  Filled 2021-07-20: qty 20

## 2021-07-20 MED ORDER — CEFAZOLIN IN SODIUM CHLORIDE 3-0.9 GM/100ML-% IV SOLN: 3.0000 g | INTRAVENOUS | Status: DC

## 2021-07-20 MED ORDER — CEFAZOLIN SODIUM-DEXTROSE 2-4 GM/100ML-% IV SOLN
INTRAVENOUS | Status: AC
  Filled 2021-07-20: qty 100

## 2021-07-20 MED ORDER — LACTATED RINGERS IV SOLN: INTRAVENOUS | Status: DC

## 2021-07-20 MED ORDER — FENTANYL CITRATE (PF) 100 MCG/2ML IJ SOLN
INTRAMUSCULAR | Status: DC | PRN
  Administered 2021-07-20: 100 ug via INTRAVENOUS

## 2021-07-20 MED ORDER — MIDAZOLAM HCL 2 MG/2ML IJ SOLN
INTRAMUSCULAR | Status: AC
  Filled 2021-07-20: qty 2

## 2021-07-20 MED ORDER — PROPOFOL 500 MG/50ML IV EMUL
INTRAVENOUS | Status: AC
  Filled 2021-07-20: qty 150

## 2021-07-20 MED ORDER — PROPOFOL 10 MG/ML IV BOLUS
INTRAVENOUS | Status: DC | PRN
  Administered 2021-07-20: 200 mg via INTRAVENOUS

## 2021-07-20 MED ORDER — OXYCODONE HCL 5 MG/5ML PO SOLN: 5.0000 mg | Freq: Once | ORAL | Status: DC | PRN

## 2021-07-20 MED ORDER — PHENYLEPHRINE HCL (PRESSORS) 10 MG/ML IV SOLN
INTRAVENOUS | Status: AC
  Filled 2021-07-20: qty 1

## 2021-07-20 MED ORDER — DEXAMETHASONE SODIUM PHOSPHATE 10 MG/ML IJ SOLN
INTRAMUSCULAR | Status: AC
  Filled 2021-07-20: qty 2

## 2021-07-20 MED ORDER — ONDANSETRON HCL 4 MG/2ML IJ SOLN
INTRAMUSCULAR | Status: AC
  Filled 2021-07-20: qty 10

## 2021-07-20 MED ORDER — PHENYLEPHRINE 40 MCG/ML (10ML) SYRINGE FOR IV PUSH (FOR BLOOD PRESSURE SUPPORT)
PREFILLED_SYRINGE | INTRAVENOUS | Status: AC
  Filled 2021-07-20: qty 20

## 2021-07-20 MED ORDER — LIDOCAINE-EPINEPHRINE 1 %-1:100000 IJ SOLN
INTRAMUSCULAR | Status: AC
  Filled 2021-07-20: qty 1

## 2021-07-20 MED ORDER — DEXMEDETOMIDINE (PRECEDEX) IN NS 20 MCG/5ML (4 MCG/ML) IV SYRINGE
PREFILLED_SYRINGE | INTRAVENOUS | Status: AC
  Filled 2021-07-20: qty 5

## 2021-07-20 MED ORDER — PHENYLEPHRINE HCL-NACL 20-0.9 MG/250ML-% IV SOLN
INTRAVENOUS | Status: DC | PRN
Start: 2021-07-20 — End: 2021-07-20
  Administered 2021-07-20: 40 ug/min via INTRAVENOUS

## 2021-07-20 MED ORDER — DEXAMETHASONE SODIUM PHOSPHATE 4 MG/ML IJ SOLN
INTRAMUSCULAR | Status: DC | PRN
  Administered 2021-07-20: 10 mg via INTRAVENOUS

## 2021-07-20 MED ORDER — MIDAZOLAM HCL 5 MG/5ML IJ SOLN
INTRAMUSCULAR | Status: DC | PRN
  Administered 2021-07-20: 2 mg via INTRAVENOUS

## 2021-07-20 MED ORDER — DEXMEDETOMIDINE (PRECEDEX) IN NS 20 MCG/5ML (4 MCG/ML) IV SYRINGE
PREFILLED_SYRINGE | INTRAVENOUS | Status: AC
  Filled 2021-07-20: qty 20

## 2021-07-20 MED ORDER — FENTANYL CITRATE (PF) 100 MCG/2ML IJ SOLN
INTRAMUSCULAR | Status: AC
  Filled 2021-07-20: qty 2

## 2021-07-20 MED ORDER — PROPOFOL 500 MG/50ML IV EMUL
INTRAVENOUS | Status: DC | PRN
  Administered 2021-07-20: 35 ug/kg/min via INTRAVENOUS

## 2021-07-20 MED ORDER — SUCCINYLCHOLINE CHLORIDE 200 MG/10ML IV SOSY
PREFILLED_SYRINGE | INTRAVENOUS | Status: AC
  Filled 2021-07-20: qty 10

## 2021-07-20 MED ORDER — EPHEDRINE SULFATE 50 MG/ML IJ SOLN
INTRAMUSCULAR | Status: DC | PRN
  Administered 2021-07-20: 20 mg via INTRAVENOUS
  Administered 2021-07-20: 10 mg via INTRAVENOUS
  Administered 2021-07-20: 20 mg via INTRAVENOUS

## 2021-07-20 MED ORDER — PROMETHAZINE HCL 25 MG/ML IJ SOLN: 6.2500 mg | INTRAMUSCULAR | Status: DC | PRN

## 2021-07-20 SURGICAL SUPPLY — 64 items
ACC NRSTM 4 TRQ WRNCH STRL (MISCELLANEOUS)
ADH SKN CLS APL DERMABOND .7 (GAUZE/BANDAGES/DRESSINGS) ×2
BLADE CLIPPER SURG (BLADE) IMPLANT
BLADE SURG 15 STRL LF DISP TIS (BLADE) ×1 IMPLANT
BLADE SURG 15 STRL SS (BLADE) ×2
CANISTER SUCT 1200ML W/VALVE (MISCELLANEOUS) ×2 IMPLANT
CORD BIPOLAR FORCEPS 12FT (ELECTRODE) ×2 IMPLANT
COVER PROBE W GEL 5X96 (DRAPES) ×2 IMPLANT
DERMABOND ADVANCED (GAUZE/BANDAGES/DRESSINGS) ×2
DERMABOND ADVANCED .7 DNX12 (GAUZE/BANDAGES/DRESSINGS) ×2 IMPLANT
DRAPE C-ARM 35X43 STRL (DRAPES) ×2 IMPLANT
DRAPE HEAD BAR (DRAPES) ×2 IMPLANT
DRAPE INCISE IOBAN 66X45 STRL (DRAPES) ×2 IMPLANT
DRAPE MICROSCOPE WILD 40.5X102 (DRAPES) ×2 IMPLANT
DRAPE UTILITY XL STRL (DRAPES) ×2 IMPLANT
DRSG TEGADERM 2-3/8X2-3/4 SM (GAUZE/BANDAGES/DRESSINGS) ×4 IMPLANT
DRSG TEGADERM 4X4.75 (GAUZE/BANDAGES/DRESSINGS) IMPLANT
ELECT COATED BLADE 2.86 ST (ELECTRODE) ×2 IMPLANT
ELECT EMG 18 NIMS (NEUROSURGERY SUPPLIES) ×2
ELECT REM PT RETURN 9FT ADLT (ELECTROSURGICAL) ×2
ELECTRODE EMG 18 NIMS (NEUROSURGERY SUPPLIES) ×1 IMPLANT
ELECTRODE REM PT RTRN 9FT ADLT (ELECTROSURGICAL) ×1 IMPLANT
FORCEPS BIPOLAR SPETZLER 8 1.0 (NEUROSURGERY SUPPLIES) ×2 IMPLANT
GAUZE 4X4 16PLY ~~LOC~~+RFID DBL (SPONGE) ×2 IMPLANT
GAUZE SPONGE 4X4 12PLY STRL (GAUZE/BANDAGES/DRESSINGS) ×2 IMPLANT
GENERATOR PULSE INSPIRE (Generator) ×2 IMPLANT
GLOVE SURG ENC MOIS LTX SZ6.5 (GLOVE) ×2 IMPLANT
GLOVE SURG ENC MOIS LTX SZ7.5 (GLOVE) ×2 IMPLANT
GLOVE SURG POLYISO LF SZ6.5 (GLOVE) ×2 IMPLANT
GLOVE SURG POLYISO LF SZ7 (GLOVE) ×4 IMPLANT
GLOVE SURG UNDER POLY LF SZ7 (GLOVE) ×6 IMPLANT
GOWN STRL REUS W/ TWL LRG LVL3 (GOWN DISPOSABLE) ×4 IMPLANT
GOWN STRL REUS W/TWL LRG LVL3 (GOWN DISPOSABLE) ×8
IV CATH 18G SAFETY (IV SOLUTION) ×2 IMPLANT
KIT NEURO ACCESSORY W/WRENCH (MISCELLANEOUS) IMPLANT
LEAD SENSING RESP INSPIRE (Lead) ×2 IMPLANT
LEAD SLEEP STIMULATION INSPIRE (Lead) ×2 IMPLANT
LOOP VESSEL MAXI BLUE (MISCELLANEOUS) ×2 IMPLANT
LOOP VESSEL MINI RED (MISCELLANEOUS) ×2 IMPLANT
MARKER SKIN DUAL TIP RULER LAB (MISCELLANEOUS) ×2 IMPLANT
NEEDLE HYPO 25X1 1.5 SAFETY (NEEDLE) ×2 IMPLANT
NS IRRIG 1000ML POUR BTL (IV SOLUTION) ×2 IMPLANT
PACK BASIN DAY SURGERY FS (CUSTOM PROCEDURE TRAY) ×2 IMPLANT
PACK ENT DAY SURGERY (CUSTOM PROCEDURE TRAY) ×2 IMPLANT
PASSER CATH 36 CODMAN DISP (NEUROSURGERY SUPPLIES) ×2 IMPLANT
PASSER CATH 38CM DISP (INSTRUMENTS) IMPLANT
PENCIL SMOKE EVACUATOR (MISCELLANEOUS) ×2 IMPLANT
PROBE NERVE STIMULATOR (NEUROSURGERY SUPPLIES) ×2 IMPLANT
REMOTE CONTROL SLEEP INSPIRE (MISCELLANEOUS) ×2 IMPLANT
SET WALTER ACTIVATION W/DRAPE (SET/KITS/TRAYS/PACK) ×2 IMPLANT
SLEEVE SCD COMPRESS KNEE MED (STOCKING) ×2 IMPLANT
SPONGE INTESTINAL PEANUT (DISPOSABLE) ×2 IMPLANT
SUT SILK 2 0 SH (SUTURE) ×2 IMPLANT
SUT SILK 3 0 REEL (SUTURE) ×2 IMPLANT
SUT SILK 3 0 SH 30 (SUTURE) ×2 IMPLANT
SUT SILK 3-0 (SUTURE) ×4
SUT SILK 3-0 RB1 30XBRD (SUTURE) ×2
SUT VIC AB 3-0 SH 27 (SUTURE) ×4
SUT VIC AB 3-0 SH 27X BRD (SUTURE) ×2 IMPLANT
SUT VIC AB 4-0 PS2 27 (SUTURE) ×4 IMPLANT
SUTURE SILK 3-0 RB1 30XBRD (SUTURE) ×2 IMPLANT
SYR 10ML LL (SYRINGE) ×2 IMPLANT
SYR BULB EAR ULCER 3OZ GRN STR (SYRINGE) ×2 IMPLANT
TOWEL GREEN STERILE FF (TOWEL DISPOSABLE) ×4 IMPLANT

## 2021-07-20 NOTE — Anesthesia Procedure Notes (Signed)
Procedure Name: Intubation Date/Time: 07/20/2021 7:57 AM Performed by: Signe Colt, CRNA Pre-anesthesia Checklist: Patient identified, Emergency Drugs available, Suction available and Patient being monitored Patient Re-evaluated:Patient Re-evaluated prior to induction Oxygen Delivery Method: Circle system utilized Preoxygenation: Pre-oxygenation with 100% oxygen Induction Type: IV induction Ventilation: Mask ventilation without difficulty Laryngoscope Size: Mac and 3 Grade View: Grade I Tube type: Oral Tube size: 7.5 mm Number of attempts: 1 Airway Equipment and Method: Stylet and Oral airway Placement Confirmation: ETT inserted through vocal cords under direct vision, positive ETCO2 and breath sounds checked- equal and bilateral Secured at: 22 cm Tube secured with: Tape Dental Injury: Teeth and Oropharynx as per pre-operative assessment

## 2021-07-20 NOTE — Brief Op Note (Signed)
07/20/2021  9:58 AM  PATIENT:  Tony Campos  55 y.o. male  PRE-OPERATIVE DIAGNOSIS:  obstructive sleep apnea  POST-OPERATIVE DIAGNOSIS:  obstructive sleep apnea  PROCEDURE:  Procedure(s): IMPLANTATION OF HYPOGLOSSAL NERVE STIMULATOR (Right)  SURGEON:  Surgeon(s) and Role:    Melida Quitter, MD - Primary  PHYSICIAN ASSISTANT: Sallee Provencal  ASSISTANTS: none   ANESTHESIA:   general  EBL:  Minimal  BLOOD ADMINISTERED:none  DRAINS: none   LOCAL MEDICATIONS USED:  LIDOCAINE   SPECIMEN:  No Specimen  DISPOSITION OF SPECIMEN:  N/A  COUNTS:  YES  TOURNIQUET:  * No tourniquets in log *  DICTATION: .Note written in EPIC  PLAN OF CARE: Discharge to home after PACU  PATIENT DISPOSITION:  PACU - hemodynamically stable.   Delay start of Pharmacological VTE agent (>24hrs) due to surgical blood loss or risk of bleeding: no

## 2021-07-20 NOTE — Anesthesia Postprocedure Evaluation (Signed)
Anesthesia Post Note  Patient: Tony Campos  Procedure(s) Performed: IMPLANTATION OF HYPOGLOSSAL NERVE STIMULATOR (Right: Chest)     Patient location during evaluation: PACU Anesthesia Type: General Level of consciousness: awake and alert Pain management: pain level controlled Vital Signs Assessment: post-procedure vital signs reviewed and stable Respiratory status: spontaneous breathing, nonlabored ventilation and respiratory function stable Cardiovascular status: blood pressure returned to baseline and stable Postop Assessment: no apparent nausea or vomiting Anesthetic complications: no   No notable events documented.  Last Vitals:  Vitals:   07/20/21 1123 07/20/21 1130  BP: 138/89 (!) 144/79  Pulse: 78 75  Resp: 20 11  Temp:    SpO2: 94% 95%    Last Pain:  Vitals:   07/20/21 1123  TempSrc:   PainSc: [place]

## 2021-07-20 NOTE — Transfer of Care (Signed)
Immediate Anesthesia Transfer of Care Note  Patient: Tony Campos  Procedure(s) Performed: IMPLANTATION OF HYPOGLOSSAL NERVE STIMULATOR (Right: Chest)  Patient Location: PACU  Anesthesia Type:General  Level of Consciousness: drowsy and patient cooperative  Airway & Oxygen Therapy: Patient Spontanous Breathing and Patient connected to face mask oxygen  Post-op Assessment: Report given to RN and Post -op Vital signs reviewed and stable  Post vital signs: Reviewed and stable  Last Vitals:  Vitals Value Taken Time  BP 148/88 07/20/21 1012  Temp    Pulse 76 07/20/21 1014  Resp 12 07/20/21 1014  SpO2 96 % 07/20/21 1014  Vitals shown include unvalidated device data.  Last Pain:  Vitals:   07/20/21 0655  TempSrc: Oral  PainSc: 0-No pain         Complications: No notable events documented.

## 2021-07-20 NOTE — Anesthesia Preprocedure Evaluation (Signed)
Anesthesia Evaluation  Patient identified by MRN, date of birth, ID band Patient awake    Reviewed: Allergy & Precautions, NPO status , Patient's Chart, lab work & pertinent test results, reviewed documented beta blocker date and time   Airway Mallampati: II  TM Distance: >3 FB Neck ROM: Full    Dental  (+) Poor Dentition, Dental Advisory Given,    Pulmonary sleep apnea , Current Smoker and Patient abstained from smoking., former smoker,    Pulmonary exam normal breath sounds clear to auscultation       Cardiovascular hypertension, Pt. on home beta blockers and Pt. on medications + Peripheral Vascular Disease (3.4cm AAA, also s/p right SFA stenting)  Normal cardiovascular exam Rhythm:Regular Rate:Normal  TTE 2014 Normal EF, valves ok   Neuro/Psych  Headaches, PSYCHIATRIC DISORDERS Anxiety Depression Bipolar Disorder    GI/Hepatic Neg liver ROS, GERD  Controlled and Medicated,  Endo/Other  Hypothyroidism   Renal/GU negative Renal ROS  negative genitourinary   Musculoskeletal  (+) Arthritis ,   Abdominal (+) + obese,   Peds  Hematology negative hematology ROS (+)   Anesthesia Other Findings On fentanyl patch 38mg/hr, not currently wearing one  Reproductive/Obstetrics                             Anesthesia Physical  Anesthesia Plan  ASA: III  Anesthesia Plan: General   Post-op Pain Management:    Induction: Intravenous  PONV Risk Score and Plan: 2 and Treatment may vary due to age or medical condition, Ondansetron and Midazolam  Airway Management Planned: Oral ETT  Additional Equipment:   Intra-op Plan:   Post-operative Plan: Extubation in OR  Informed Consent: I have reviewed the patients History and Physical, chart, labs and discussed the procedure including the risks, benefits and alternatives for the proposed anesthesia with the patient or authorized representative who has  indicated his/her understanding and acceptance.     Dental advisory given  Plan Discussed with: CRNA  Anesthesia Plan Comments:         Anesthesia Quick Evaluation

## 2021-07-20 NOTE — Discharge Instructions (Signed)

## 2021-07-20 NOTE — Op Note (Signed)

## 2021-07-20 NOTE — H&P (Signed)
Tony Campos is an 55 y.o. male.   Chief Complaint: Obstructive sleep apnea HPI: 55 year old male with with obstructive sleep apnea who has not been able to tolerate CPAP.  Past Medical History:  Diagnosis Date   AAA (abdominal aortic aneurysm) without rupture (HCC)    3.4 cm found on mri 2/20   Acid reflux    Anemia    Anxiety    Aortic atherosclerosis (HCC)    Bipolar disorder (HCC)    Depression    Diverticulitis    Dyspnea    exertional   Genital warts    Hyperlipidemia    Hypertension    Hypothyroidism    Meningitis    PAD (peripheral artery disease) ([place])    femoral artery angioplasy/stent   SIADH (syndrome of inappropriate ADH production) ([place])    Sleep apnea 12/2015   Smoker    Smoker    Spondylosis    Testosterone deficiency     Past Surgical History:  Procedure Laterality Date   APPENDECTOMY  12/2018   at [place]  2007   CLOSED REDUCTION MANDIBLE N/A 03/09/2018   Procedure: CLOSED REDUCTION MANDIBULAR MMF;  Surgeon: Jerrell Belfast, MD;  Location: [place];  Service: ENT;  Laterality: N/A;   COLONOSCOPY  07/26/2020   10/22/2018   DRUG INDUCED ENDOSCOPY N/A 04/06/2021   Procedure: DRUG INDUCED ENDOSCOPY;  Surgeon: Melida Quitter, MD;  Location: [place];  Service: ENT;  Laterality: N/A;   KNEE SURGERY     right   LAPAROSCOPIC APPENDECTOMY N/A 07/25/2018   Procedure: APPENDECTOMY LAPAROSCOPIC;  Surgeon: Aviva Signs, MD;  Location: [facility];  Service: General;  Laterality: N/A;   MANDIBULAR HARDWARE REMOVAL N/A 05/06/2018   Procedure: REMOVAL OF MMF HARDWARE;  Surgeon: Jerrell Belfast, MD;  Location: [place];  Service: ENT;  Laterality: N/A;   NASAL SEPTUM SURGERY     NASAL SINUS SURGERY     POLYPECTOMY     right sfa recanalization and stenting      [facility] 12/2015   UPPER GASTROINTESTINAL ENDOSCOPY  07/26/2020   WRIST SURGERY     right and left    Family History  Problem Relation Age of Onset    Other Father        killed in [place]   Cancer Maternal Grandfather        stomach   Stomach cancer Maternal Grandfather    Colon cancer Neg Hx    Colitis Neg Hx    Esophageal cancer Neg Hx    Rectal cancer Neg Hx    Social History:  reports that he has been smoking cigarettes. He has been smoking an average of .25 packs per day. He has never used smokeless tobacco. He reports that he does not drink alcohol and does not use drugs.  Allergies: No Known Allergies  Medications Prior to Admission  Medication Sig Dispense Refill   amphetamine-dextroamphetamine (ADDERALL) 30 MG tablet Take 30 mg by mouth daily.     aspirin EC 81 MG tablet Take 81 mg by mouth daily.     baclofen (LIORESAL) 10 MG tablet Take 10 mg by mouth 4 (four) times daily.     brexpiprazole (REXULTI) 1 MG TABS tablet Take by mouth daily.     clonazePAM (KLONOPIN) 1 MG tablet Take 1 mg by mouth 2 (two) times daily.     DULoxetine (CYMBALTA) 60 MG capsule Take 60 mg by mouth daily.  fentaNYL (DURAGESIC - DOSED MCG/HR) 50 MCG/HR Place 50 mcg onto the skin. Patient has weaned patches down to every 6 days, and uses MSIR 54m as needed  0   ferrous sulfate 325 (65 FE) MG EC tablet Take 325 mg by mouth 3 (three) times daily with meals.     furosemide (LASIX) 40 MG tablet TAKE 1 TABLET BY MOUTH TWICE DAILY AS NEEDED. (Patient taking differently: as needed. TAKE 1 TABLET BY MOUTH TWICE DAILY AS NEEDED.) 60 tablet 0   lamoTRIgine (LAMICTAL) 200 MG tablet Take 200 mg by mouth daily.     levothyroxine (SYNTHROID) 125 MCG tablet TAKE 1 TABLET BY MOUTH DAILY BEFORE BREAKFAST. 90 tablet 0   losartan-hydrochlorothiazide (HYZAAR) 50-12.5 MG tablet Take 1 tablet by mouth daily.     metoprolol tartrate (LOPRESSOR) 25 MG tablet TAKE (1) TABLET TWICE DAILY. 60 tablet 0   morphine (MSIR) 15 MG tablet Take 15 mg by mouth 2 (two) times daily.     pantoprazole (PROTONIX) 40 MG tablet TAKE (1) TABLET TWICE DAILY. 180 tablet 3   pregabalin  (LYRICA) 150 MG capsule Take 150 mg by mouth 2 (two) times daily.     rosuvastatin (CRESTOR) 40 MG tablet TAKE 1 TABLET DAILY. 30 tablet 3   tamsulosin (FLOMAX) 0.4 MG CAPS capsule Take 1 capsule (0.4 mg total) by mouth daily. 30 capsule 3   valACYclovir (VALTREX) 500 MG tablet TAKE 1 TABLET BY MOUTH ONCE DAILY. 30 tablet 3   vitamin E 1000 UNIT capsule Take 1,000 Units by mouth daily.     morphine (MS CONTIN) 30 MG 12 hr tablet Take 30 mg by mouth 2 (two) times daily as needed. (Patient not taking: No sig reported)     NARCAN 4 MG/0.1ML LIQD nasal spray kit SMARTSIG:1 Spray(s) Both Nares Once PRN (Patient not taking: No sig reported)      No results found for this or any previous visit (from the past 48 hour(s)). No results found.  Review of Systems  All other systems reviewed and are negative.  Blood pressure (!) 148/84, pulse (!) 57, temperature 97.9 F (36.6 C), temperature source Oral, resp. rate 18, height 6' (1.829 m), weight 105 kg, SpO2 99 %. Physical Exam Constitutional:      Appearance: Normal appearance. He is normal weight.  HENT:     Head: Normocephalic and atraumatic.     Right Ear: External ear normal.     Left Ear: External ear normal.     Nose: Nose normal.     Mouth/Throat:     Mouth: Mucous membranes are moist.     Pharynx: Oropharynx is clear.  Eyes:     Extraocular Movements: Extraocular movements intact.     Conjunctiva/sclera: Conjunctivae normal.     Pupils: Pupils are equal, round, and reactive to light.  Cardiovascular:     Rate and Rhythm: Normal rate.  Pulmonary:     Effort: Pulmonary effort is normal.  Musculoskeletal:     Cervical back: Normal range of motion.  Skin:    General: Skin is warm and dry.  Neurological:     General: No focal deficit present.     Mental Status: He is alert and oriented to person, place, and time.  Psychiatric:        Mood and Affect: Mood normal.        Behavior: Behavior normal.        Thought Content: Thought  content normal.  Judgment: Judgment normal.     Assessment/Plan Obstructive sleep apnea  To OR for hypoglossal nerve stimulator placement.  Melida Quitter, MD 07/20/2021, 7:30 AM

## 2021-07-21 ENCOUNTER — Encounter (HOSPITAL_BASED_OUTPATIENT_CLINIC_OR_DEPARTMENT_OTHER): Payer: Self-pay | Admitting: Otolaryngology

## 2021-07-25 DIAGNOSIS — D225 Melanocytic nevi of trunk: Secondary | ICD-10-CM | POA: Diagnosis not present

## 2021-07-25 DIAGNOSIS — Z1283 Encounter for screening for malignant neoplasm of skin: Secondary | ICD-10-CM | POA: Diagnosis not present

## 2021-07-25 DIAGNOSIS — D485 Neoplasm of uncertain behavior of skin: Secondary | ICD-10-CM | POA: Diagnosis not present

## 2021-07-25 DIAGNOSIS — Z85828 Personal history of other malignant neoplasm of skin: Secondary | ICD-10-CM | POA: Diagnosis not present

## 2021-07-25 DIAGNOSIS — D2239 Melanocytic nevi of other parts of face: Secondary | ICD-10-CM | POA: Diagnosis not present

## 2021-08-03 DIAGNOSIS — G894 Chronic pain syndrome: Secondary | ICD-10-CM | POA: Diagnosis not present

## 2021-08-03 DIAGNOSIS — M47816 Spondylosis without myelopathy or radiculopathy, lumbar region: Secondary | ICD-10-CM | POA: Diagnosis not present

## 2021-08-03 DIAGNOSIS — M62830 Muscle spasm of back: Secondary | ICD-10-CM | POA: Diagnosis not present

## 2021-08-03 DIAGNOSIS — M47812 Spondylosis without myelopathy or radiculopathy, cervical region: Secondary | ICD-10-CM | POA: Diagnosis not present

## 2021-08-05 ENCOUNTER — Ambulatory Visit (INDEPENDENT_AMBULATORY_CARE_PROVIDER_SITE_OTHER): Payer: Medicare HMO | Admitting: Nurse Practitioner

## 2021-08-05 ENCOUNTER — Other Ambulatory Visit: Payer: Self-pay | Admitting: Otolaryngology

## 2021-08-05 DIAGNOSIS — R918 Other nonspecific abnormal finding of lung field: Secondary | ICD-10-CM

## 2021-08-05 DIAGNOSIS — R519 Headache, unspecified: Secondary | ICD-10-CM
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
## 2021-08-08 ENCOUNTER — Ambulatory Visit (INDEPENDENT_AMBULATORY_CARE_PROVIDER_SITE_OTHER): Payer: Medicare HMO | Admitting: Family Medicine

## 2021-08-08 ENCOUNTER — Other Ambulatory Visit: Payer: Self-pay

## 2021-08-08 ENCOUNTER — Encounter: Payer: Self-pay | Admitting: Family Medicine

## 2021-08-08 VITALS — BP 120/82 | HR 84 | Temp 98.2°F | Resp 16 | Ht 72.0 in | Wt 225.0 lb

## 2021-08-08 DIAGNOSIS — R918 Other nonspecific abnormal finding of lung field: Secondary | ICD-10-CM | POA: Diagnosis not present

## 2021-08-08 DIAGNOSIS — G44209 Tension-type headache, unspecified, not intractable: Secondary | ICD-10-CM | POA: Diagnosis not present

## 2021-08-08 DIAGNOSIS — I10 Essential (primary) hypertension: Secondary | ICD-10-CM | POA: Diagnosis not present

## 2021-08-08 LAB — CBC WITH DIFFERENTIAL/PLATELET
Absolute Monocytes: 673 cells/uL (ref 200–950)
Basophils Absolute: 64 cells/uL (ref 0–200)
Basophils Relative: 0.7 %
Eosinophils Absolute: 0 cells/uL — ABNORMAL LOW (ref 15–500)
Eosinophils Relative: 0 %
HCT: 40.9 % (ref 38.5–50.0)
Hemoglobin: 13 g/dL — ABNORMAL LOW (ref 13.2–17.1)
Lymphs Abs: 1993 cells/uL (ref 850–3900)
MCH: 28.1 pg (ref 27.0–33.0)
MCHC: 31.8 g/dL — ABNORMAL LOW (ref 32.0–36.0)
MCV: 88.5 fL (ref 80.0–100.0)
MPV: 10.4 fL (ref 7.5–12.5)
Monocytes Relative: 7.4 %
Neutro Abs: 6370 cells/uL (ref 1500–7800)
Neutrophils Relative %: 70 %
Platelets: 190 10*3/uL (ref 140–400)
RBC: 4.62 10*6/uL (ref 4.20–5.80)
RDW: 13.8 % (ref 11.0–15.0)
Total Lymphocyte: 21.9 %
WBC: 9.1 10*3/uL (ref 3.8–10.8)

## 2021-08-08 LAB — SEDIMENTATION RATE: Sed Rate: 33 mm/h — ABNORMAL HIGH (ref 0–20)

## 2021-08-08 MED ORDER — PREDNISONE 20 MG PO TABS: ORAL_TABLET | ORAL | 0 refills | Status: DC

## 2021-08-08 NOTE — Progress Notes (Signed)
Subjective:    Patient ID: Tony Campos, male    DOB: 23-Mar-1966, 55 y.o.   MRN: [id number]  Patient recently had surgery for hypoglossal nerve stimulator for obstructive sleep apnea.  The preoperative work-up revealed a right hilar mass on chest x-ray.  They have recommended a CAT scan to evaluate this further.  Since the surgery, he has had a right-sided headache.  He is now more than 2 weeks after his surgery.  The pain is located above his right ear.  He states that at times he can feel a lump in the scalp in that area.  This seems to come and go.  On palpation today I do not appreciate any lump or mass or swelling.  There is no palpable abnormality.  He also complains of some pain over the temporal artery.  He has been having some blurry vision associated with a headache.  He states that he also occasionally gets diplopia with a headache.  However the headache has been gradually improving.  Differential diagnosis includes migraine, temporal arteritis, tension headache.  I feel that a space-occupying lesion or aneurysm is unlikely given the patient's symptomatology. Past Medical History:  Diagnosis Date   AAA (abdominal aortic aneurysm) without rupture (HCC)    3.4 cm found on mri 2/20   Acid reflux    Anemia    Anxiety    Aortic atherosclerosis (HCC)    Bipolar disorder (HCC)    Depression    Diverticulitis    Dyspnea    exertional   Genital warts    Hyperlipidemia    Hypertension    Hypothyroidism    Meningitis    PAD (peripheral artery disease) ([place])    femoral artery angioplasy/stent   SIADH (syndrome of inappropriate ADH production) ([place])    Sleep apnea 12/2015   Smoker    Smoker    Spondylosis    Testosterone deficiency    Past Surgical History:  Procedure Laterality Date   APPENDECTOMY  12/2018   at [place]  2007   CLOSED REDUCTION MANDIBLE N/A 03/09/2018   Procedure: CLOSED REDUCTION MANDIBULAR MMF;  Surgeon: Jerrell Belfast, MD;  Location:  [place];  Service: ENT;  Laterality: N/A;   COLONOSCOPY  07/26/2020   10/22/2018   DRUG INDUCED ENDOSCOPY N/A 04/06/2021   Procedure: DRUG INDUCED ENDOSCOPY;  Surgeon: Melida Quitter, MD;  Location: [place];  Service: ENT;  Laterality: N/A;   IMPLANTATION OF HYPOGLOSSAL NERVE STIMULATOR Right 07/20/2021   Procedure: IMPLANTATION OF HYPOGLOSSAL NERVE STIMULATOR;  Surgeon: Melida Quitter, MD;  Location: [place];  Service: ENT;  Laterality: Right;   KNEE SURGERY     right   LAPAROSCOPIC APPENDECTOMY N/A 07/25/2018   Procedure: APPENDECTOMY LAPAROSCOPIC;  Surgeon: Aviva Signs, MD;  Location: [facility];  Service: General;  Laterality: N/A;   MANDIBULAR HARDWARE REMOVAL N/A 05/06/2018   Procedure: REMOVAL OF MMF HARDWARE;  Surgeon: Jerrell Belfast, MD;  Location: [place];  Service: ENT;  Laterality: N/A;   NASAL SEPTUM SURGERY     NASAL SINUS SURGERY     POLYPECTOMY     right sfa recanalization and stenting      [facility] 12/2015   UPPER GASTROINTESTINAL ENDOSCOPY  07/26/2020   WRIST SURGERY     right and left   Current Outpatient Medications on File Prior to Visit  Medication Sig Dispense Refill   amphetamine-dextroamphetamine (ADDERALL) 30 MG tablet Take 30 mg by mouth daily.  aspirin EC 81 MG tablet Take 81 mg by mouth daily.     baclofen (LIORESAL) 10 MG tablet Take 10 mg by mouth 4 (four) times daily.     brexpiprazole (REXULTI) 1 MG TABS tablet Take by mouth daily.     clonazePAM (KLONOPIN) 1 MG tablet Take 1 mg by mouth 2 (two) times daily.     DULoxetine (CYMBALTA) 60 MG capsule Take 60 mg by mouth daily.     ferrous sulfate 325 (65 FE) MG EC tablet Take 325 mg by mouth 3 (three) times daily with meals.     furosemide (LASIX) 40 MG tablet TAKE 1 TABLET BY MOUTH TWICE DAILY AS NEEDED. (Patient taking differently: as needed. TAKE 1 TABLET BY MOUTH TWICE DAILY AS NEEDED.) 60 tablet 0   lamoTRIgine (LAMICTAL) 200 MG tablet Take 200 mg by  mouth daily.     levothyroxine (SYNTHROID) 125 MCG tablet TAKE 1 TABLET BY MOUTH DAILY BEFORE BREAKFAST. 90 tablet 0   losartan-hydrochlorothiazide (HYZAAR) 50-12.5 MG tablet Take 1 tablet by mouth daily.     metoprolol tartrate (LOPRESSOR) 25 MG tablet TAKE (1) TABLET TWICE DAILY. 60 tablet 0   morphine (MS CONTIN) 30 MG 12 hr tablet Take 30 mg by mouth 2 (two) times daily as needed.     morphine (MSIR) 15 MG tablet Take 15 mg by mouth 2 (two) times daily.     NARCAN 4 MG/0.1ML LIQD nasal spray kit SMARTSIG:1 Spray(s) Both Nares Once PRN     pantoprazole (PROTONIX) 40 MG tablet TAKE (1) TABLET TWICE DAILY. 180 tablet 3   pregabalin (LYRICA) 150 MG capsule Take 150 mg by mouth 2 (two) times daily.     rosuvastatin (CRESTOR) 40 MG tablet TAKE 1 TABLET DAILY. 30 tablet 3   tamsulosin (FLOMAX) 0.4 MG CAPS capsule Take 1 capsule (0.4 mg total) by mouth daily. 30 capsule 3   valACYclovir (VALTREX) 500 MG tablet TAKE 1 TABLET BY MOUTH ONCE DAILY. 30 tablet 3   vitamin E 1000 UNIT capsule Take 1,000 Units by mouth daily.     [DISCONTINUED] esomeprazole (NEXIUM) 40 MG capsule Take 40 mg by mouth daily before breakfast.     No current facility-administered medications on file prior to visit.    No Known Allergies Social History   Socioeconomic History   Marital status: Married    Spouse name: Not on file   Number of children: 0   Years of education: Not on file   Highest education level: Not on file  Occupational History   Not on file  Tobacco Use   Smoking status: Some Days    Packs/day: 0.25    Types: Cigarettes   Smokeless tobacco: Never  Vaping Use   Vaping Use: Every day  Substance and Sexual Activity   Alcohol use: No   Drug use: No   Sexual activity: Not Currently  Other Topics Concern   Not on file  Social History Narrative   Not on file   Social Determinants of Health   Financial Resource Strain: Not on file  Food Insecurity: Not on file  Transportation Needs: Not on  file  Physical Activity: Not on file  Stress: Not on file  Social Connections: Not on file  Intimate Partner Violence: Not on file      Review of Systems  All other systems reviewed and are negative.     Objective:   Physical Exam Vitals reviewed.  Constitutional:      General: He is  not in acute distress.    Appearance: Normal appearance. He is not ill-appearing or toxic-appearing.  HENT:     Head: Normocephalic and atraumatic.     Mouth/Throat:     Mouth: Mucous membranes are dry.  Eyes:     Extraocular Movements: Extraocular movements intact.     Conjunctiva/sclera: Conjunctivae normal.     Pupils: Pupils are equal, round, and reactive to light.  Cardiovascular:     Rate and Rhythm: Normal rate and regular rhythm.     Heart sounds: Normal heart sounds. No murmur heard.   No friction rub. No gallop.  Pulmonary:     Effort: Pulmonary effort is normal.     Breath sounds: Normal breath sounds.  Neurological:     General: No focal deficit present.     Mental Status: He is alert and oriented to person, place, and time. Mental status is at baseline.     Cranial Nerves: No cranial nerve deficit.     Sensory: No sensory deficit.     Motor: No weakness.     Coordination: Coordination normal.        Assessment & Plan:  Acute non intractable tension-type headache - Plan: CBC with Differential/Platelet, Sedimentation rate My leading suspicion is that the patient is having migraines or tension headaches.  We will try the patient on a prednisone taper pack.  I will check a sed rate to evaluate for any sign of temporal arteritis.  He is already on baclofen and Klonopin so I do not feel comfortable giving him any additional muscle relaxers.  If the headache is not improving with the prednisone, consider Nurtec.  Recheck later this week.  I will schedule the patient for a CT scan of the chest to evaluate the lesion seen on the chest x-ray

## 2021-08-17 ENCOUNTER — Other Ambulatory Visit: Payer: Self-pay | Admitting: Family Medicine

## 2021-08-19 ENCOUNTER — Ambulatory Visit: Payer: Medicare HMO | Admitting: Family Medicine

## 2021-08-22 ENCOUNTER — Ambulatory Visit (INDEPENDENT_AMBULATORY_CARE_PROVIDER_SITE_OTHER): Payer: Medicare HMO | Admitting: Family Medicine

## 2021-08-22 ENCOUNTER — Other Ambulatory Visit: Payer: Self-pay

## 2021-08-22 ENCOUNTER — Encounter: Payer: Self-pay | Admitting: Family Medicine

## 2021-08-22 VITALS — BP 128/78 | HR 88 | Temp 97.9°F | Resp 16 | Ht 72.0 in | Wt 225.0 lb

## 2021-08-22 DIAGNOSIS — R0602 Shortness of breath: Secondary | ICD-10-CM

## 2021-08-22 DIAGNOSIS — J40 Bronchitis, not specified as acute or chronic: Secondary | ICD-10-CM

## 2021-08-22 MED ORDER — IPRATROPIUM-ALBUTEROL 0.5-2.5 (3) MG/3ML IN SOLN
3.0000 mL | Freq: Once | RESPIRATORY_TRACT | Status: AC
  Administered 2021-08-22: 3 mL via RESPIRATORY_TRACT

## 2021-08-22 MED ORDER — METHYLPREDNISOLONE ACETATE 80 MG/ML IJ SUSP
80.0000 mg | Freq: Once | INTRAMUSCULAR | Status: AC
  Administered 2021-08-22: 80 mg via INTRAMUSCULAR

## 2021-08-22 MED ORDER — LEVOFLOXACIN 500 MG PO TABS: 500.0000 mg | ORAL_TABLET | Freq: Every day | ORAL | 0 refills | Status: AC

## 2021-08-22 MED ORDER — ALBUTEROL SULFATE HFA 108 (90 BASE) MCG/ACT IN AERS: 2.0000 | INHALATION_SPRAY | Freq: Four times a day (QID) | RESPIRATORY_TRACT | 0 refills | Status: DC | PRN

## 2021-08-22 MED ORDER — PREDNISONE 20 MG PO TABS: ORAL_TABLET | ORAL | 0 refills | Status: DC

## 2021-08-22 NOTE — Progress Notes (Signed)
Subjective:    Patient ID: Tony Campos, male    DOB: 09-Jul-1966, 55 y.o.   MRN: [id number] Please see the last office visit.  I treated the patient for a headache on the right temple for possible temporal arteritis with prednisone.  He had a mildly elevated sed rate.  He is here today to recheck his sed rate.  His headache is completely stopped and he is no longer on prednisone.  However, the patient presents today with pulse oximetry down to 84%.  He is wheezing and all 4 lung fields.  He has rhonchorous breath sounds in both bases.  He states that on his recent vacation, he was riding a plane back with someone who had a "cold".  He developed an upper respiratory infection.  He now reports chest congestion.  He is 94% on 2 L.  However his pulmonary exam is significant for diffuse bronchospasm Past Medical History:  Diagnosis Date   AAA (abdominal aortic aneurysm) without rupture (HCC)    3.4 cm found on mri 2/20   Acid reflux    Anemia    Anxiety    Aortic atherosclerosis (HCC)    Bipolar disorder (HCC)    Depression    Diverticulitis    Dyspnea    exertional   Genital warts    Hyperlipidemia    Hypertension    Hypothyroidism    Meningitis    PAD (peripheral artery disease) ([place])    femoral artery angioplasy/stent   SIADH (syndrome of inappropriate ADH production) ([place])    Sleep apnea 12/2015   Smoker    Smoker    Spondylosis    Testosterone deficiency    Past Surgical History:  Procedure Laterality Date   APPENDECTOMY  12/2018   at [place]  2007   CLOSED REDUCTION MANDIBLE N/A 03/09/2018   Procedure: CLOSED REDUCTION MANDIBULAR MMF;  Surgeon: Jerrell Belfast, MD;  Location: [place];  Service: ENT;  Laterality: N/A;   COLONOSCOPY  07/26/2020   10/22/2018   DRUG INDUCED ENDOSCOPY N/A 04/06/2021   Procedure: DRUG INDUCED ENDOSCOPY;  Surgeon: Melida Quitter, MD;  Location: [place];  Service: ENT;  Laterality: N/A;   IMPLANTATION OF  HYPOGLOSSAL NERVE STIMULATOR Right 07/20/2021   Procedure: IMPLANTATION OF HYPOGLOSSAL NERVE STIMULATOR;  Surgeon: Melida Quitter, MD;  Location: [place];  Service: ENT;  Laterality: Right;   KNEE SURGERY     right   LAPAROSCOPIC APPENDECTOMY N/A 07/25/2018   Procedure: APPENDECTOMY LAPAROSCOPIC;  Surgeon: Aviva Signs, MD;  Location: [facility];  Service: General;  Laterality: N/A;   MANDIBULAR HARDWARE REMOVAL N/A 05/06/2018   Procedure: REMOVAL OF MMF HARDWARE;  Surgeon: Jerrell Belfast, MD;  Location: [place];  Service: ENT;  Laterality: N/A;   NASAL SEPTUM SURGERY     NASAL SINUS SURGERY     POLYPECTOMY     right sfa recanalization and stenting      [facility] 12/2015   UPPER GASTROINTESTINAL ENDOSCOPY  07/26/2020   WRIST SURGERY     right and left   Current Outpatient Medications on File Prior to Visit  Medication Sig Dispense Refill   amphetamine-dextroamphetamine (ADDERALL) 30 MG tablet Take 30 mg by mouth daily.     aspirin EC 81 MG tablet Take 81 mg by mouth daily.     baclofen (LIORESAL) 10 MG tablet Take 10 mg by mouth 4 (four) times daily.     brexpiprazole (REXULTI) 1 MG TABS  tablet Take by mouth daily.     clonazePAM (KLONOPIN) 1 MG tablet Take 1 mg by mouth 2 (two) times daily.     DULoxetine (CYMBALTA) 60 MG capsule Take 60 mg by mouth daily.     ferrous sulfate 325 (65 FE) MG EC tablet Take 325 mg by mouth 3 (three) times daily with meals.     furosemide (LASIX) 40 MG tablet TAKE 1 TABLET BY MOUTH TWICE DAILY AS NEEDED. (Patient taking differently: as needed. TAKE 1 TABLET BY MOUTH TWICE DAILY AS NEEDED.) 60 tablet 0   lamoTRIgine (LAMICTAL) 200 MG tablet Take 200 mg by mouth daily.     levothyroxine (SYNTHROID) 125 MCG tablet TAKE 1 TABLET BY MOUTH DAILY BEFORE BREAKFAST. 90 tablet 0   losartan-hydrochlorothiazide (HYZAAR) 50-12.5 MG tablet Take 1 tablet by mouth daily.     metoprolol tartrate (LOPRESSOR) 25 MG tablet TAKE (1) TABLET TWICE  DAILY. 60 tablet 0   morphine (MS CONTIN) 30 MG 12 hr tablet Take 30 mg by mouth 2 (two) times daily as needed.     morphine (MSIR) 15 MG tablet Take 15 mg by mouth 2 (two) times daily.     NARCAN 4 MG/0.1ML LIQD nasal spray kit SMARTSIG:1 Spray(s) Both Nares Once PRN     pantoprazole (PROTONIX) 40 MG tablet TAKE (1) TABLET TWICE DAILY. 180 tablet 3   pregabalin (LYRICA) 150 MG capsule Take 150 mg by mouth 2 (two) times daily.     rosuvastatin (CRESTOR) 40 MG tablet TAKE 1 TABLET DAILY. 30 tablet 3   tamsulosin (FLOMAX) 0.4 MG CAPS capsule TAKE 1 CAPSULE DAILY. 30 capsule 0   valACYclovir (VALTREX) 500 MG tablet TAKE 1 TABLET BY MOUTH ONCE DAILY. 30 tablet 3   vitamin E 1000 UNIT capsule Take 1,000 Units by mouth daily.     [DISCONTINUED] esomeprazole (NEXIUM) 40 MG capsule Take 40 mg by mouth daily before breakfast.     No current facility-administered medications on file prior to visit.    No Known Allergies Social History   Socioeconomic History   Marital status: Married    Spouse name: Not on file   Number of children: 0   Years of education: Not on file   Highest education level: Not on file  Occupational History   Not on file  Tobacco Use   Smoking status: Some Days    Packs/day: 0.25    Types: Cigarettes   Smokeless tobacco: Never  Vaping Use   Vaping Use: Every day  Substance and Sexual Activity   Alcohol use: No   Drug use: No   Sexual activity: Not Currently  Other Topics Concern   Not on file  Social History Narrative   Not on file   Social Determinants of Health   Financial Resource Strain: Not on file  Food Insecurity: Not on file  Transportation Needs: Not on file  Physical Activity: Not on file  Stress: Not on file  Social Connections: Not on file  Intimate Partner Violence: Not on file      Review of Systems  All other systems reviewed and are negative.     Objective:   Physical Exam Vitals reviewed.  Constitutional:      General: He is  not in acute distress.    Appearance: Normal appearance. He is not ill-appearing, toxic-appearing or diaphoretic.  HENT:     Head: Normocephalic and atraumatic.  Cardiovascular:     Rate and Rhythm: Normal rate and regular rhythm.  Heart sounds: Normal heart sounds. No murmur heard.   No friction rub. No gallop.  Pulmonary:     Effort: Pulmonary effort is normal.     Breath sounds: Wheezing and rhonchi present.  Neurological:     Mental Status: He is alert.        Assessment & Plan:  SOB (shortness of breath) - Plan: ipratropium-albuterol (DUONEB) 0.5-2.5 (3) MG/3ML nebulizer solution 3 mL, methylPREDNISolone acetate (DEPO-MEDROL) injection 80 mg  Bronchitis with acute wheezing Patient was given a DuoNeb in the office.  Afterward his pulse oximetry improved to 89% on room air.  We discussed going to the hospital but the patient refuses to go to the hospital because he states that he feels fine.  I would agree that the patient is in no distress.  He is not labored.  He is talking normally however he does have significant congestion and wheezing.  I will start the patient on Levaquin 500 mg daily for 7 days, he received 80 mg of Depo-Medrol IM x1 and will start a prednisone taper pack tomorrow.  Begin albuterol 2 puffs every 4-6 hours as needed and recheck first thing tomorrow

## 2021-08-23 ENCOUNTER — Telehealth: Payer: Self-pay | Admitting: Neurology

## 2021-08-23 ENCOUNTER — Encounter: Payer: Self-pay | Admitting: Family Medicine

## 2021-08-23 ENCOUNTER — Ambulatory Visit (INDEPENDENT_AMBULATORY_CARE_PROVIDER_SITE_OTHER): Payer: Medicare HMO | Admitting: Family Medicine

## 2021-08-23 VITALS — BP 130/74 | HR 74 | Temp 97.9°F | Resp 16 | Ht 72.0 in | Wt 221.0 lb

## 2021-08-23 DIAGNOSIS — J441 Chronic obstructive pulmonary disease with (acute) exacerbation: Secondary | ICD-10-CM

## 2021-08-23 NOTE — Progress Notes (Signed)
Subjective:    Patient ID: Tony Campos, male    DOB: 12-Sep-1966, 55 y.o.   MRN: [id number] 08/22/21 Please see the last office visit.  I treated the patient for a headache on the right temple for possible temporal arteritis with prednisone.  He had a mildly elevated sed rate.  He is here today to recheck his sed rate.  His headache is completely stopped and he is no longer on prednisone.  However, the patient presents today with pulse oximetry down to 84%.  He is wheezing and all 4 lung fields.  He has rhonchorous breath sounds in both bases.  He states that on his recent vacation, he was riding a plane back with someone who had a "cold".  He developed an upper respiratory infection.  He now reports chest congestion.  He is 94% on 2 L.  However his pulmonary exam is significant for diffuse bronchospasm.  At that time, my plan was:  Patient was given a DuoNeb in the office.  Afterward his pulse oximetry improved to 89% on room air.  We discussed going to the hospital but the patient refuses to go to the hospital because he states that he feels fine.  I would agree that the patient is in no distress.  He is not labored.  He is talking normally however he does have significant congestion and wheezing.  I will start the patient on Levaquin 500 mg daily for 7 days, he received 80 mg of Depo-Medrol IM x1 and will start a prednisone taper pack tomorrow.  Begin albuterol 2 puffs every 4-6 hours as needed and recheck first thing tomorrow  08/23/21 He is doing much better today.  His oxygen is 96% on room air.  He states that his breathing feels much better.  The congestion in his lungs is much better.  He denies any chest pain or pleurisy.  He does report restlessness on the steroids and the albuterol.  He denies any hemoptysis.  He denies any purulent sputum.  He denies any fevers or chills Past Medical History:  Diagnosis Date   AAA (abdominal aortic aneurysm) without rupture (HCC)    3.4 cm found on mri  2/20   Acid reflux    Anemia    Anxiety    Aortic atherosclerosis (HCC)    Bipolar disorder (HCC)    Depression    Diverticulitis    Dyspnea    exertional   Genital warts    Hyperlipidemia    Hypertension    Hypothyroidism    Meningitis    PAD (peripheral artery disease) (HCC)    femoral artery angioplasy/stent   SIADH (syndrome of inappropriate ADH production) ([place])    Sleep apnea 12/2015   Smoker    Smoker    Spondylosis    Testosterone deficiency    Past Surgical History:  Procedure Laterality Date   APPENDECTOMY  12/2018   at [place]  2007   CLOSED REDUCTION MANDIBLE N/A 03/09/2018   Procedure: CLOSED REDUCTION MANDIBULAR MMF;  Surgeon: Jerrell Belfast, MD;  Location: [place];  Service: ENT;  Laterality: N/A;   COLONOSCOPY  07/26/2020   10/22/2018   DRUG INDUCED ENDOSCOPY N/A 04/06/2021   Procedure: DRUG INDUCED ENDOSCOPY;  Surgeon: Melida Quitter, MD;  Location: [place];  Service: ENT;  Laterality: N/A;   IMPLANTATION OF HYPOGLOSSAL NERVE STIMULATOR Right 07/20/2021   Procedure: IMPLANTATION OF HYPOGLOSSAL NERVE STIMULATOR;  Surgeon: Melida Quitter, MD;  Location: [facility]  [place];  Service: ENT;  Laterality: Right;   KNEE SURGERY     right   LAPAROSCOPIC APPENDECTOMY N/A 07/25/2018   Procedure: APPENDECTOMY LAPAROSCOPIC;  Surgeon: Aviva Signs, MD;  Location: [facility];  Service: General;  Laterality: N/A;   MANDIBULAR HARDWARE REMOVAL N/A 05/06/2018   Procedure: REMOVAL OF MMF HARDWARE;  Surgeon: Jerrell Belfast, MD;  Location: [place];  Service: ENT;  Laterality: N/A;   NASAL SEPTUM SURGERY     NASAL SINUS SURGERY     POLYPECTOMY     right sfa recanalization and stenting      [facility] 12/2015   UPPER GASTROINTESTINAL ENDOSCOPY  07/26/2020   WRIST SURGERY     right and left   Current Outpatient Medications on File Prior to Visit  Medication Sig Dispense Refill   albuterol (VENTOLIN HFA) 108 (90 Base)  MCG/ACT inhaler Inhale 2 puffs into the lungs every 6 (six) hours as needed for wheezing or shortness of breath. 8 g 0   amphetamine-dextroamphetamine (ADDERALL) 30 MG tablet Take 30 mg by mouth daily.     aspirin EC 81 MG tablet Take 81 mg by mouth daily.     baclofen (LIORESAL) 10 MG tablet Take 10 mg by mouth 4 (four) times daily.     brexpiprazole (REXULTI) 1 MG TABS tablet Take by mouth daily.     clonazePAM (KLONOPIN) 1 MG tablet Take 1 mg by mouth 2 (two) times daily.     DULoxetine (CYMBALTA) 60 MG capsule Take 60 mg by mouth daily.     ferrous sulfate 325 (65 FE) MG EC tablet Take 325 mg by mouth 3 (three) times daily with meals.     furosemide (LASIX) 40 MG tablet TAKE 1 TABLET BY MOUTH TWICE DAILY AS NEEDED. (Patient taking differently: as needed. TAKE 1 TABLET BY MOUTH TWICE DAILY AS NEEDED.) 60 tablet 0   lamoTRIgine (LAMICTAL) 200 MG tablet Take 200 mg by mouth daily.     levofloxacin (LEVAQUIN) 500 MG tablet Take 1 tablet (500 mg total) by mouth daily for 7 days. 7 tablet 0   levothyroxine (SYNTHROID) 125 MCG tablet TAKE 1 TABLET BY MOUTH DAILY BEFORE BREAKFAST. 90 tablet 0   losartan-hydrochlorothiazide (HYZAAR) 50-12.5 MG tablet Take 1 tablet by mouth daily.     metoprolol tartrate (LOPRESSOR) 25 MG tablet TAKE (1) TABLET TWICE DAILY. 60 tablet 0   morphine (MS CONTIN) 30 MG 12 hr tablet Take 30 mg by mouth 2 (two) times daily as needed.     morphine (MSIR) 15 MG tablet Take 15 mg by mouth 2 (two) times daily.     NARCAN 4 MG/0.1ML LIQD nasal spray kit SMARTSIG:1 Spray(s) Both Nares Once PRN     pantoprazole (PROTONIX) 40 MG tablet TAKE (1) TABLET TWICE DAILY. 180 tablet 3   predniSONE (DELTASONE) 20 MG tablet 3 tabs poqday 1-2, 2 tabs poqday 3-4, 1 tab poqday 5-6 12 tablet 0   pregabalin (LYRICA) 150 MG capsule Take 150 mg by mouth 2 (two) times daily.     rosuvastatin (CRESTOR) 40 MG tablet TAKE 1 TABLET DAILY. 30 tablet 3   tamsulosin (FLOMAX) 0.4 MG CAPS capsule TAKE 1  CAPSULE DAILY. 30 capsule 0   valACYclovir (VALTREX) 500 MG tablet TAKE 1 TABLET BY MOUTH ONCE DAILY. 30 tablet 3   vitamin E 1000 UNIT capsule Take 1,000 Units by mouth daily.     [DISCONTINUED] esomeprazole (NEXIUM) 40 MG capsule Take 40 mg by mouth daily before  breakfast.     No current facility-administered medications on file prior to visit.    No Known Allergies Social History   Socioeconomic History   Marital status: Married    Spouse name: Not on file   Number of children: 0   Years of education: Not on file   Highest education level: Not on file  Occupational History   Not on file  Tobacco Use   Smoking status: Some Days    Packs/day: 0.25    Types: Cigarettes   Smokeless tobacco: Never  Vaping Use   Vaping Use: Every day  Substance and Sexual Activity   Alcohol use: No   Drug use: No   Sexual activity: Not Currently  Other Topics Concern   Not on file  Social History Narrative   Not on file   Social Determinants of Health   Financial Resource Strain: Not on file  Food Insecurity: Not on file  Transportation Needs: Not on file  Physical Activity: Not on file  Stress: Not on file  Social Connections: Not on file  Intimate Partner Violence: Not on file      Review of Systems  All other systems reviewed and are negative.     Objective:   Physical Exam Vitals reviewed.  Constitutional:      General: He is not in acute distress.    Appearance: Normal appearance. He is not ill-appearing, toxic-appearing or diaphoretic.  HENT:     Head: Normocephalic and atraumatic.  Cardiovascular:     Rate and Rhythm: Normal rate and regular rhythm.     Heart sounds: Normal heart sounds. No murmur heard.   No friction rub. No gallop.  Pulmonary:     Effort: Pulmonary effort is normal.     Breath sounds: Rhonchi present. No wheezing.  Neurological:     Mental Status: He is alert.        Assessment & Plan:  COPD exacerbation ([place]) He is doing much better  today.  Decrease albuterol to 2 puffs every 6 hours as needed.  Discontinue standing albuterol.  Complete prednisone and Levaquin.  Patient has COPD.  This is his first exacerbation.  At the present time I do not feel that he needs a maintenance medicine however I will keep this in the back of our minds in case frequent exacerbations began occurring

## 2021-08-23 NOTE — Telephone Encounter (Signed)
Pt called states he did not receive a remote for his inspire and wants to know what he should do. Pt requesting a call back.

## 2021-08-23 NOTE — Telephone Encounter (Signed)
Called the patient and advised that at his activation apt that is scheduled for 08/30/2021. Pt verbalized understanding. Pt had no questions at this time but was encouraged to call back if questions arise.

## 2021-08-25 DIAGNOSIS — M47812 Spondylosis without myelopathy or radiculopathy, cervical region: Secondary | ICD-10-CM | POA: Diagnosis not present

## 2021-08-25 DIAGNOSIS — M47816 Spondylosis without myelopathy or radiculopathy, lumbar region: Secondary | ICD-10-CM | POA: Diagnosis not present

## 2021-08-25 DIAGNOSIS — M62830 Muscle spasm of back: Secondary | ICD-10-CM | POA: Diagnosis not present

## 2021-08-25 DIAGNOSIS — G894 Chronic pain syndrome: Secondary | ICD-10-CM | POA: Diagnosis not present

## 2021-08-30 ENCOUNTER — Ambulatory Visit: Payer: Medicare HMO | Admitting: Neurology

## 2021-08-30 VITALS — Ht 72.0 in | Wt 228.0 lb

## 2021-08-30 DIAGNOSIS — Z789 Other specified health status: Secondary | ICD-10-CM | POA: Diagnosis not present

## 2021-08-30 DIAGNOSIS — G4719 Other hypersomnia: Secondary | ICD-10-CM

## 2021-08-30 DIAGNOSIS — G4733 Obstructive sleep apnea (adult) (pediatric): Secondary | ICD-10-CM
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
## 2021-08-30 NOTE — Patient Instructions (Signed)
INSIRE ACTIVATION NOTE - 08-30-2021.

## 2021-08-31 ENCOUNTER — Ambulatory Visit (HOSPITAL_COMMUNITY)
Admission: RE | Admit: 2021-08-31 | Discharge: 2021-08-31 | Disposition: A | Discharge status: Home / Self Care | Payer: Medicare HMO | Source: Ambulatory Visit | Attending: Family Medicine | Admitting: Family Medicine

## 2021-08-31 ENCOUNTER — Other Ambulatory Visit: Payer: Self-pay

## 2021-08-31 DIAGNOSIS — R918 Other nonspecific abnormal finding of lung field: Secondary | ICD-10-CM | POA: Diagnosis not present

## 2021-08-31 DIAGNOSIS — J439 Emphysema, unspecified: Secondary | ICD-10-CM | POA: Diagnosis not present

## 2021-08-31 DIAGNOSIS — J9811 Atelectasis: Secondary | ICD-10-CM | POA: Diagnosis not present

## 2021-08-31 DIAGNOSIS — I700 Atherosclerosis of aorta: Secondary | ICD-10-CM | POA: Diagnosis not present

## 2021-08-31 LAB — POCT I-STAT CREATININE: Creatinine, Ser: 0.9 mg/dL (ref 0.61–1.24)

## 2021-08-31 MED ORDER — IOHEXOL 350 MG/ML SOLN
60.0000 mL | Freq: Once | INTRAVENOUS | Status: AC | PRN
  Administered 2021-08-31: 60 mL via INTRAVENOUS

## 2021-09-07 ENCOUNTER — Other Ambulatory Visit: Payer: Self-pay | Admitting: Family Medicine

## 2021-09-19 ENCOUNTER — Telehealth: Payer: Self-pay | Admitting: Neurology

## 2021-09-19 NOTE — Telephone Encounter (Signed)
Pt called states his inspire remote is not working and he is fixing to be 2 weeks behind on it. Also pt inquiring about a sleep study. Pt requesting a call back.

## 2021-09-21 ENCOUNTER — Other Ambulatory Visit: Payer: Self-pay | Admitting: Family Medicine

## 2021-09-26 DIAGNOSIS — M47816 Spondylosis without myelopathy or radiculopathy, lumbar region: Secondary | ICD-10-CM | POA: Diagnosis not present

## 2021-09-26 DIAGNOSIS — M62830 Muscle spasm of back: Secondary | ICD-10-CM | POA: Diagnosis not present

## 2021-09-26 DIAGNOSIS — Z79891 Long term (current) use of opiate analgesic: Secondary | ICD-10-CM | POA: Diagnosis not present

## 2021-09-26 DIAGNOSIS — G894 Chronic pain syndrome: Secondary | ICD-10-CM | POA: Diagnosis not present

## 2021-09-26 DIAGNOSIS — M47812 Spondylosis without myelopathy or radiculopathy, cervical region: Secondary | ICD-10-CM | POA: Diagnosis not present

## 2021-10-24 ENCOUNTER — Other Ambulatory Visit: Payer: Self-pay | Admitting: Family Medicine

## 2021-10-24 DIAGNOSIS — R000 Tachycardia, unspecified: Secondary | ICD-10-CM

## 2021-10-27 DIAGNOSIS — M62830 Muscle spasm of back: Secondary | ICD-10-CM | POA: Diagnosis not present

## 2021-10-27 DIAGNOSIS — G894 Chronic pain syndrome: Secondary | ICD-10-CM | POA: Diagnosis not present

## 2021-10-27 DIAGNOSIS — M47812 Spondylosis without myelopathy or radiculopathy, cervical region: Secondary | ICD-10-CM | POA: Diagnosis not present

## 2021-10-27 DIAGNOSIS — M47816 Spondylosis without myelopathy or radiculopathy, lumbar region: Secondary | ICD-10-CM | POA: Diagnosis not present

## 2021-11-03 ENCOUNTER — Other Ambulatory Visit: Payer: Self-pay | Admitting: Neurology

## 2021-11-03 DIAGNOSIS — Z789 Other specified health status: Secondary | ICD-10-CM

## 2021-11-03 DIAGNOSIS — G4719 Other hypersomnia: Secondary | ICD-10-CM

## 2021-11-03 DIAGNOSIS — G4733 Obstructive sleep apnea (adult) (pediatric): Secondary | ICD-10-CM

## 2021-11-03 DIAGNOSIS — F518 Other sleep disorders not due to a substance or known physiological condition: Secondary | ICD-10-CM

## 2021-11-03 DIAGNOSIS — G4710 Hypersomnia, unspecified: Secondary | ICD-10-CM

## 2021-11-04 ENCOUNTER — Telehealth: Payer: Medicare HMO | Admitting: Physician Assistant

## 2021-11-04 DIAGNOSIS — J208 Acute bronchitis due to other specified organisms: Secondary | ICD-10-CM | POA: Diagnosis not present

## 2021-11-04 MED ORDER — BENZONATATE 100 MG PO CAPS: 100.0000 mg | ORAL_CAPSULE | Freq: Three times a day (TID) | ORAL | 0 refills | Status: DC | PRN

## 2021-11-04 MED ORDER — PREDNISONE 10 MG (21) PO TBPK: ORAL_TABLET | ORAL | 0 refills | Status: DC

## 2021-11-04 NOTE — Progress Notes (Signed)
We are sorry that you are not feeling well.  Here is how we plan to help!  Based on your presentation I believe you most likely have A cough due to a virus.  This is called viral bronchitis and is best treated by rest, plenty of fluids and control of the cough.  You may use Ibuprofen or Tylenol as directed to help your symptoms.     In addition you may use A prescription cough medication called Tessalon Perles 100mg . You may take 1-2 capsules every 8 hours as needed for your cough.  I have also sent in a steroid taper.  Directions for 6 day taper: Day 1: 2 tablets before breakfast, 1 after both lunch & dinner and 2 at bedtime Day 2: 1 tab before breakfast, 1 after both lunch & dinner and 2 at bedtime Day 3: 1 tab at each meal & 1 at bedtime Day 4: 1 tab at breakfast, 1 at lunch, 1 at bedtime Day 5: 1 tab at breakfast & 1 tab at bedtime Day 6: 1 tab at breakfast  From your responses in the eVisit questionnaire you describe inflammation in the upper respiratory tract which is causing a significant cough.  This is commonly called Bronchitis and has four common causes:   Allergies Viral Infections Acid Reflux Bacterial Infection Allergies, viruses and acid reflux are treated by controlling symptoms or eliminating the cause. An example might be a cough caused by taking certain blood pressure medications. You stop the cough by changing the medication. Another example might be a cough caused by acid reflux. Controlling the reflux helps control the cough.  USE OF BRONCHODILATOR ("RESCUE") INHALERS: There is a risk from using your bronchodilator too frequently.  The risk is that over-reliance on a medication which only relaxes the muscles surrounding the breathing tubes can reduce the effectiveness of medications prescribed to reduce swelling and congestion of the tubes themselves.  Although you feel brief relief from the bronchodilator inhaler, your asthma may actually be worsening with the tubes  becoming more swollen and filled with mucus.  This can delay other crucial treatments, such as oral steroid medications. If you need to use a bronchodilator inhaler daily, several times per day, you should discuss this with your provider.  There are probably better treatments that could be used to keep your asthma under control.     HOME CARE Only take medications as instructed by your medical team. Complete the entire course of an antibiotic. Drink plenty of fluids and get plenty of rest. Avoid close contacts especially the very young and the elderly Cover your mouth if you cough or cough into your sleeve. Always remember to wash your hands A steam or ultrasonic humidifier can help congestion.   GET HELP RIGHT AWAY IF: You develop worsening fever. You become short of breath You cough up blood. Your symptoms persist after you have completed your treatment plan MAKE SURE YOU  Understand these instructions. Will watch your condition. Will get help right away if you are not doing well or get worse.    Thank you for choosing an e-visit.  Your e-visit answers were reviewed by a board certified advanced clinical practitioner to complete your personal care plan. Depending upon the condition, your plan could have included both over the counter or prescription medications.  Please review your pharmacy choice. Make sure the pharmacy is open so you can pick up prescription now. If there is a problem, you may contact your provider through [employer]  and have the prescription routed to another pharmacy.  Your safety is important to us. If you have drug allergies check your prescription carefully.   For the next 24 hours you can use MyChart to ask questions about today's visit, request a non-urgent call back, or ask for a work or school excuse. You will get an email in the next two days asking about your experience. I hope that your e-visit has been valuable and will speed your recovery.  

## 2021-11-04 NOTE — Progress Notes (Signed)
I have spent 5 minutes in review of e-visit questionnaire, review and updating patient chart, medical decision making and response to patient.   Rosabella Edgin Cody Termaine Roupp, PA-C    

## 2021-11-17 ENCOUNTER — Ambulatory Visit: Payer: Medicare HMO | Admitting: Neurology

## 2021-11-18 ENCOUNTER — Ambulatory Visit (INDEPENDENT_AMBULATORY_CARE_PROVIDER_SITE_OTHER): Payer: Medicare HMO | Admitting: Neurology

## 2021-11-18 ENCOUNTER — Other Ambulatory Visit: Payer: Self-pay

## 2021-11-18 DIAGNOSIS — G4733 Obstructive sleep apnea (adult) (pediatric): Secondary | ICD-10-CM | POA: Diagnosis not present

## 2021-11-18 DIAGNOSIS — G4739 Other sleep apnea: Secondary | ICD-10-CM

## 2021-11-18 DIAGNOSIS — G4710 Hypersomnia, unspecified: Secondary | ICD-10-CM

## 2021-11-18 DIAGNOSIS — Z789 Other specified health status: Secondary | ICD-10-CM

## 2021-11-18 DIAGNOSIS — G4719 Other hypersomnia: Secondary | ICD-10-CM

## 2021-11-18 DIAGNOSIS — G4734 Idiopathic sleep related nonobstructive alveolar hypoventilation: Secondary | ICD-10-CM

## 2021-11-18 DIAGNOSIS — F518 Other sleep disorders not due to a substance or known physiological condition: Secondary | ICD-10-CM

## 2021-11-24 DIAGNOSIS — M62830 Muscle spasm of back: Secondary | ICD-10-CM | POA: Diagnosis not present

## 2021-11-24 DIAGNOSIS — M47812 Spondylosis without myelopathy or radiculopathy, cervical region: Secondary | ICD-10-CM | POA: Diagnosis not present

## 2021-11-24 DIAGNOSIS — G894 Chronic pain syndrome: Secondary | ICD-10-CM | POA: Diagnosis not present

## 2021-11-24 DIAGNOSIS — M47816 Spondylosis without myelopathy or radiculopathy, lumbar region: Secondary | ICD-10-CM | POA: Diagnosis not present

## 2021-11-28 ENCOUNTER — Other Ambulatory Visit: Payer: Self-pay | Admitting: Family Medicine

## 2021-12-07 ENCOUNTER — Other Ambulatory Visit: Payer: Self-pay | Admitting: Family Medicine

## 2021-12-07 ENCOUNTER — Other Ambulatory Visit: Payer: Self-pay

## 2021-12-07 DIAGNOSIS — R000 Tachycardia, unspecified: Secondary | ICD-10-CM

## 2021-12-09 DIAGNOSIS — E785 Hyperlipidemia, unspecified: Secondary | ICD-10-CM | POA: Diagnosis not present

## 2021-12-09 DIAGNOSIS — G629 Polyneuropathy, unspecified: Secondary | ICD-10-CM | POA: Diagnosis not present

## 2021-12-09 DIAGNOSIS — B009 Herpesviral infection, unspecified: Secondary | ICD-10-CM | POA: Diagnosis not present

## 2021-12-09 DIAGNOSIS — R69 Illness, unspecified: Secondary | ICD-10-CM | POA: Diagnosis not present

## 2021-12-09 DIAGNOSIS — F17210 Nicotine dependence, cigarettes, uncomplicated: Secondary | ICD-10-CM | POA: Diagnosis not present

## 2021-12-09 DIAGNOSIS — G47419 Narcolepsy without cataplexy: Secondary | ICD-10-CM | POA: Diagnosis not present

## 2021-12-09 DIAGNOSIS — F319 Bipolar disorder, unspecified: Secondary | ICD-10-CM | POA: Diagnosis not present

## 2021-12-09 DIAGNOSIS — G8929 Other chronic pain: Secondary | ICD-10-CM | POA: Diagnosis not present

## 2021-12-09 DIAGNOSIS — I70209 Unspecified atherosclerosis of native arteries of extremities, unspecified extremity: Secondary | ICD-10-CM | POA: Diagnosis not present

## 2021-12-09 DIAGNOSIS — E039 Hypothyroidism, unspecified: Secondary | ICD-10-CM | POA: Diagnosis not present

## 2021-12-09 DIAGNOSIS — Z008 Encounter for other general examination: Secondary | ICD-10-CM | POA: Diagnosis not present

## 2021-12-09 DIAGNOSIS — G4739 Other sleep apnea: Secondary | ICD-10-CM | POA: Insufficient documentation

## 2021-12-09 DIAGNOSIS — F909 Attention-deficit hyperactivity disorder, unspecified type: Secondary | ICD-10-CM | POA: Diagnosis not present

## 2021-12-09 DIAGNOSIS — I10 Essential (primary) hypertension: Secondary | ICD-10-CM | POA: Diagnosis not present

## 2021-12-09 DIAGNOSIS — F419 Anxiety disorder, unspecified: Secondary | ICD-10-CM | POA: Diagnosis not present

## 2021-12-09 DIAGNOSIS — G4734 Idiopathic sleep related nonobstructive alveolar hypoventilation: Secondary | ICD-10-CM | POA: Insufficient documentation

## 2021-12-09 NOTE — Procedures (Signed)
PATIENT'S NAME:  Tony Campos, Tony Campos DOB:      [birth year]      MR#:    [id number]     DATE OF RECORDING: 11/18/2021 REFERRING M.D.: Dr Wilburn Cornelia, MD ENT Study Performed:  Polysomnogram with Inspire Titration HISTORY:  Tony Campos, date of birth 1966/01/20, presented on 11-18-2021 at [place] at [facility] for an Inspire overnight titration. The Pre-implant baseline AHI was 17/h, based on a watch pat  HST performed at [place] on 11/03/2020. That HST followed a nasal septum surgery and jaw surgery.  The patient's hypoglossal nerve stimulator (INSPIRE) was implanted at [facility] on 07/20/2021 by ENT, Dr Wilburn Cornelia  The patient's device was activated at [facility] on 08/30/2021, and voltage was set at 2.4V. The patient was comfortable while sufficient tongue movement was observed. Therapy duration was 8 hours after a 30-minute delay.  The Pre-implant BMI was 30.8 kg/m2. Epworth score remained at  The patient arrived at the sleep lab with an incoming amplitude of 3.3 V.  The patient's usage was 30 hours per week with good subjective benefit.  The patient had been a CPAP user until 2019, when he suffered a jaw fracture, with a residual AHI of 1.6/h. The baseline apnea 01-10-16 before PAP initiation was 55/h without REM dependency, question of central apnea, he was changed to BiPAP and ASV. 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 HLA positive, but has comorbidities: smoker until recently, bipolar, chronic pain, PAD.  CURRENT MEDICATIONS: Ventolin HFA, Adderall, Aspirin, Lioresal, Rexulti, Klonopin, Cymbalta, Ferrous sulfate, Lasix, Lamictal, Synthroid, Hyzaar, Lopressor, MS Contin, MSIR, Narcan, Protonix, Deltasone, Lyrica, Crestor, Flomax, Valtrex, Vitamin E   PROCEDURE:  This is a multichannel digital polysomnogram utilizing the Somnostar 11.2 system.  Electrodes and sensors were applied and monitored per AASM Specifications.   EEG, EOG, Chin and Limb EMG, were sampled at 200 Hz.  ECG,  Snore and Nasal Pressure, Thermal Airflow, Respiratory Effort, CPAP Flow and Pressure, Oximetry was sampled at 50 Hz. Digital video and audio were recorded.      BASELINE STUDY: Tony Campos was turned on after the patient fell asleep with a starting amplitude at 3.1 V.0.2 Volts below the Incoming amplitude of: 3.3 Volts.  He developed hypoxia immediately and needed finally 1 liter oxygen added.  The baseline AHI without Inspire was 41.2/h. The he slept 68 minutes under 3.1 V with an AHI of 1.8. higher voltage did not correlate to more control, but caused also central apnea, 36 if them arose during a trial at 3.2 V.   Assessment : Inspire was titrated to a maximum amplitude of 3.2 Volts. Therapeutic Amplitude was found at 3.1 Volts.    The patient's AHI at therapeutic amplitude of 3.1 V was estimated to be: AHI1.8 /hour.  After this sleep study was completed, the patient was programmed back to the Incoming Amplitude of 3.3 V.   In addition, the lower limit was programmed 0.2V below the incoming amplitude, and the upper limit was programmed to 1.0 V above the lower limit.    Lights Out was at 00:03 and Lights On at 06:29.  Total recording time (TRT) was 386.5 minutes, with a total sleep time (TST) of 354 minutes.  The patient's sleep latency was 6 minutes.  minutes.  REM latency was 0 minutes.  The sleep efficiency was 91.6 %.   SLEEP ARCHITECTURE: WASO (Wake after sleep onset) was 25.5 minutes.  There were 9 minutes in Stage N1, 345 minutes Stage  N2, 0 minutes Stage N3 and 0 minutes in Stage REM.  The percentage of Stage N1 was 2.5%, Stage N2 was 97.5%, but Stage N3 was 0% and Stage R (REM sleep) was 0%.   RESPIRATORY ANALYSIS:  There were a total of 5 respiratory events:  15 obstructive apneas, 0 central apneas and 0 mixed apneas with a total of 59 apneas and an apnea index (AI) of 10 /hour. There was 1 hypopnea with a hypopnea index of 0.2 /hour. The total APNEA/HYPOPNEA INDEX (AHI) was 10.2 /hour all  non-REM AHI of 10. And all non-supine AHI of 10.2.  OXYGEN SATURATION & C02:  The baseline 02 saturation was 94%, with the lowest being 73%. Time spent below 89% saturation equaled 54 minutes- severe Hypoxemia made addition of oxygen necessary- this was titrated to 1 liter. The patient had a total of 0 Periodic Limb Movements. The patient took bathroom breaks. No Snoring was noted. EKG was in keeping with normal sinus rhythm (NSR) with PVCs.  IMPRESSION: The patient's AHI at therapeutic amplitude of 3.1 V was estimated to be: AHI1.8 /hour.  After this sleep study was completed, the patient was programmed back to the Incoming Amplitude of 3.3 V.  Obstructive Sleep Apnea (OSA) at baseline converted to central apnea at 3.2V. The patient responded well to 3.1 V while in non-supine position.  Hypoxia for almost 1 hour required addition of Oxygen at 1 liter.  Mild Snoring under 3.1 V, none under 3.2V.  Central Sleep Apnea, treatment emergent under INSPIRE.    RECOMMENDATIONS: Keep Inspire at 3.1V setting, which was well tolerated and controlled obstructive events while not triggering central apnea.  Hypoxemia was present, nadir at 75% until 1 liter oxygen was added, 02 could not be removed without causing oxygen drop.    I certify that I have reviewed the entire raw data recording prior to the issuance of this report in accordance with the Standards of Accreditation of the American Academy of Sleep Medicine (AASM)  Larey Seat, MD Diplomat, American Board of Neurology  Diplomat, American Board of Sleep Medicine Market researcher, [employer] [facility] at [employer]

## 2021-12-09 NOTE — Addendum Note (Signed)
Addended by: Larey Seat on: 12/09/2021 03:19 PM   Modules accepted: Orders

## 2021-12-13 ENCOUNTER — Encounter: Payer: Self-pay | Admitting: Neurology

## 2021-12-23 ENCOUNTER — Ambulatory Visit: Payer: Medicare HMO | Admitting: Neurology

## 2021-12-27 DIAGNOSIS — M62830 Muscle spasm of back: Secondary | ICD-10-CM | POA: Diagnosis not present

## 2021-12-27 DIAGNOSIS — G894 Chronic pain syndrome: Secondary | ICD-10-CM | POA: Diagnosis not present

## 2021-12-27 DIAGNOSIS — M47816 Spondylosis without myelopathy or radiculopathy, lumbar region: Secondary | ICD-10-CM | POA: Diagnosis not present

## 2021-12-27 DIAGNOSIS — M47812 Spondylosis without myelopathy or radiculopathy, cervical region: Secondary | ICD-10-CM | POA: Diagnosis not present

## 2022-01-13 ENCOUNTER — Ambulatory Visit: Payer: Medicare HMO | Admitting: Neurology

## 2022-01-23 ENCOUNTER — Telehealth: Payer: Self-pay | Admitting: Family Medicine

## 2022-01-23 NOTE — Chronic Care Management (AMB) (Signed)
°  Chronic Care Management   Note  01/23/2022 Name: ILYAS LIPSITZ MRN: [id number] DOB: Jan 10, 1966  CLAUDELL WOHLER is a 56 y.o. year old male who is a primary care patient of Dennard Schaumann, Cammie Mcgee, MD. I reached out to [name] by phone today in response to a referral sent by Mr. Akiva Brassfield Biswell's PCP, Susy Frizzle, MD.   Mr. Shives was given information about Chronic Care Management services today including:  CCM service includes personalized support from designated clinical staff supervised by his physician, including individualized plan of care and coordination with other care providers 24/7 contact phone numbers for assistance for urgent and routine care needs. Service will only be billed when office clinical staff spend 20 minutes or more in a month to coordinate care. Only one practitioner may furnish and bill the service in a calendar month. The patient may stop CCM services at any time (effective at the end of the month) by phone call to the office staff.   Patient agreed to services and verbal consent obtained.   Follow up plan:   Tatjana Secretary/administrator

## 2022-01-24 DIAGNOSIS — G894 Chronic pain syndrome: Secondary | ICD-10-CM | POA: Diagnosis not present

## 2022-01-24 DIAGNOSIS — M47816 Spondylosis without myelopathy or radiculopathy, lumbar region: Secondary | ICD-10-CM | POA: Diagnosis not present

## 2022-01-24 DIAGNOSIS — M62830 Muscle spasm of back: Secondary | ICD-10-CM | POA: Diagnosis not present

## 2022-01-24 DIAGNOSIS — M47812 Spondylosis without myelopathy or radiculopathy, cervical region: Secondary | ICD-10-CM | POA: Diagnosis not present

## 2022-01-30 DIAGNOSIS — M1711 Unilateral primary osteoarthritis, right knee: Secondary | ICD-10-CM | POA: Diagnosis not present

## 2022-02-06 ENCOUNTER — Other Ambulatory Visit: Payer: Self-pay | Admitting: Family Medicine

## 2022-02-06 DIAGNOSIS — R000 Tachycardia, unspecified: Secondary | ICD-10-CM

## 2022-02-15 NOTE — Progress Notes (Signed)
? ?Chronic Care Management ?Pharmacy Note ? ?02/23/2022 ?Name:  Tony Campos MRN:  [id number] DOB:  08/06/1966 ? ?Summary: ?Initial visit PharmD.  Complains of excessive daytime sleepiness.  Combo of psych meds and his pain meds all contributing to this.  Difficult to control pain and daytime drowsiness.  Possible lingering depression untreated that is worsening his sleepiness with lack of motivation.  Will discuss with Poulos at next visit. ? ?Recommendations/Changes made from today's visit: ?Consider increasing Rexulti - will discuss with psych. ?Try to find something he can do and likes to do even for short periods during the day. ? ?Plan: ?FU 6 months ? ? ?Subjective: ?Tony Campos is an 56 y.o. year old male who is a primary patient of Pickard, Cammie Mcgee, MD.  The CCM team was consulted for assistance with disease management and care coordination needs.   ? ?Engaged with patient face to face for initial visit in response to provider referral for pharmacy case management and/or care coordination services.  ? ?Consent to Services:  ?The patient was given the following information about Chronic Care Management services today, agreed to services, and gave verbal consent: 1. CCM service includes personalized support from designated clinical staff supervised by the primary care provider, including individualized plan of care and coordination with other care providers 2. 24/7 contact phone numbers for assistance for urgent and routine care needs. 3. Service will only be billed when office clinical staff spend 20 minutes or more in a month to coordinate care. 4. Only one practitioner may furnish and bill the service in a calendar month. 5.The patient may stop CCM services at any time (effective at the end of the month) by phone call to the office staff. 6. The patient will be responsible for cost sharing (co-pay) of up to 20% of the service fee (after annual deductible is met). Patient agreed to services and consent  obtained. ? ?Patient Care Team: ?Susy Frizzle, MD as PCP - General (Family Medicine) ?Arnoldo Lenis, MD as PCP - Cardiology (Cardiology) ?Edythe Clarity, [facility] as Pharmacist (Pharmacist) ? ?Recent office visits:  ?11/04/21 Raiford Noble, PA-C - Family Medicine (E Visit) - Viral bronchitis - benzonatate (TESSALON) 100 MG capsule and predniSONE (STERAPRED UNI-PAK 21 TAB) 10 MG (21) TBPK tablet prescribed. Follow up as needed.  ?  ?  ?Recent consult visits:  ?11/24/21 Margaretha Sheffield - Chronic pain syndrome - No notes available.  ?  ?10/27/21 Margaretha Sheffield - Chronic pain syndrome - No notes available. ?  ?09/26/21 Margaretha Sheffield - Chronic pain syndrome - No notes available. ?  ?08/30/21 Larey Seat MD - Neurology - Daytime sleepiness - No changes. Follow up by phone in 4 weeks and then in office in 90 days repeat sleep study to titrate. ?  ?08/25/21 Margaretha Sheffield - Chronic pain syndrome - No notes available. ? ?Objective: ? ?Lab Results  ?Component Value Date  ? CREATININE 0.90 08/31/2021  ? BUN 9 07/14/2021  ? GFR 81.16 12/06/2018  ? GFRNONAA >60 07/14/2021  ? GFRAA 75 03/31/2021  ? NA 140 07/14/2021  ? K 4.2 07/14/2021  ? CALCIUM 9.2 07/14/2021  ? CO2 33 (H) 07/14/2021  ? GLUCOSE 111 (H) 07/14/2021  ? ? ?Lab Results  ?Component Value Date/Time  ? HGBA1C 5.4 10/18/2015 11:52 AM  ? GFR 81.16 12/06/2018 12:05 PM  ?  ?Last diabetic Eye exam: No results found for: HMDIABEYEEXA  ?Last diabetic Foot exam: No results found for: HMDIABFOOTEX  ? ?Lab Results  ?  Component Value Date  ? CHOL 123 03/31/2021  ? HDL 34 (L) 03/31/2021  ? [place] 62 03/31/2021  ? TRIG 197 (H) 03/31/2021  ? CHOLHDL 3.6 03/31/2021  ? ? ? ?  Latest Ref Rng & Units 03/31/2021  ?  9:35 AM 06/29/2020  ? 10:33 AM 09/12/2019  ?  9:07 AM  ?Hepatic Function  ?Total Protein 6.1 - 8.1 g/dL 6.2   6.1   6.1    ?AST 10 - 35 U/L _0 ?ALT 9 - 46 U/L _1 ?Total Bilirubin 0.2 - 1.2 mg/dL 0.8   0.4   0.4    ? ? ?Lab Results  ?Component  Value Date/Time  ? TSH 0.78 06/23/2021 02:57 PM  ? TSH 0.25 (L) 06/29/2020 10:33 AM  ? FREET4 1.21 10/11/2015 03:44 PM  ? ? ? ?  Latest Ref Rng & Units 08/08/2021  ? 10:07 AM 03/31/2021  ?  9:35 AM 06/29/2020  ? 10:33 AM  ?CBC  ?WBC 3.8 - 10.8 Thousand/uL 9.1   7.5   8.8    ?Hemoglobin 13.2 - 17.1 g/dL 13.0   11.8   11.9    ?Hematocrit 38.5 - 50.0 % 40.9   35.3   36.1    ?Platelets 140 - 400 Thousand/uL 190   228   203    ? ? ?No results found for: VD25OH ? ?Clinical ASCVD: Yes  ?The ASCVD Risk score (Arnett DK, et al., 2019) failed to calculate for the following reasons: ?  The valid total cholesterol range is 130 to 320 mg/dL   ? ? ?  03/31/2021  ?  9:28 AM 06/10/2018  ?  4:24 PM 12/09/2015  ? 11:05 AM  ?Depression screen PHQ 2/9  ?Decreased Interest _2 ?Down, Depressed, Hopeless _3 ?PHQ - 2 Score _4 ?Altered sleeping 3  3  ?Tired, decreased energy 3  3  ?Change in appetite 3  3  ?Feeling bad or failure about yourself  3  3  ?Trouble concentrating 3  3  ?Moving slowly or fidgety/restless 1  3  ?Suicidal thoughts 0  1  ?PHQ-9 Score 21  21  ?Difficult doing work/chores Very difficult  Somewhat difficult  ?  ? ? ?Social History  ? ?Tobacco Use  ?Smoking Status Some Days  ? Packs/day: 0.25  ? Types: Cigarettes  ?Smokeless Tobacco Never  ? ?BP Readings from Last 3 Encounters:  ?08/23/21 130/74  ?08/22/21 128/78  ?08/08/21 120/82  ? ?Pulse Readings from Last 3 Encounters:  ?08/23/21 74  ?08/22/21 88  ?08/08/21 84  ? ?Wt Readings from Last 3 Encounters:  ?08/30/21 228 lb (103.4 kg)  ?08/23/21 221 lb (100.2 kg)  ?08/22/21 225 lb (102.1 kg)  ? ?BMI Readings from Last 3 Encounters:  ?08/30/21 30.92 kg/m?  ?08/23/21 29.97 kg/m?  ?08/22/21 30.52 kg/m?  ? ? ?Assessment/Interventions: Review of patient past medical history, allergies, medications, health status, including review of consultants reports, laboratory and other test data, was performed as part of comprehensive evaluation and provision of chronic care management  services.  ? ?SDOH:  (Social Determinants of Health) assessments and interventions performed: Yes ? ?Financial Resource Strain: Not on file  ? ?Food Insecurity: Not on file  ? ? ?SDOH Screenings  ? ?Alcohol Screen: Low Risk   ? Last Alcohol Screening Score (AUDIT): 0  ?Depression (PHQ2-9): Medium Risk  ?  PHQ-2 Score: 21  ?Financial Resource Strain: Not on file  ?Food Insecurity: Not on file  ?Housing: Not on file  ?Physical Activity: Not on file  ?Social Connections: Not on file  ?Stress: Not on file  ?Tobacco Use: High Risk  ? Smoking Tobacco Use: Some Days  ? Smokeless Tobacco Use: Never  ? Passive Exposure: Not on file  ?Transportation Needs: Not on file  ? ? ?[place] ? ?No Known Allergies ? ?Medications Reviewed Today   ? ? Reviewed by Edythe Clarity, RPH (Pharmacist) on 02/23/22 at 41  Med List Status: <None>  ? ?Medication Order Taking? Sig Documenting Provider Last Dose Status Informant  ?amphetamine-dextroamphetamine (ADDERALL) 30 MG tablet [id number] Yes Take 30 mg by mouth daily. [provider] Taking Active   ?aspirin EC 81 MG tablet [id number] Yes Take 81 mg by mouth daily. [provider] Taking Active   ?baclofen (LIORESAL) 10 MG tablet [id number] Yes Take 10 mg by mouth 4 (four) times daily. [provider] Taking Active   ?clonazePAM (KLONOPIN) 1 MG tablet [id number] Yes Take 1 mg by mouth 2 (two) times daily. [provider] Taking Active Pharmacy Records  ?DULoxetine (CYMBALTA) 60 MG capsule [id number] Yes Take 60 mg by mouth daily. [provider] Taking Active   ?  Discontinued 03/19/13 1008 (Change in therapy)   ?furosemide (LASIX) 40 MG tablet [id number] Yes TAKE 1 TABLET BY MOUTH TWICE DAILY AS NEEDED.  ?Patient taking differently: as needed. TAKE 1 TABLET BY MOUTH TWICE DAILY AS NEEDED.  ? Susy Frizzle, MD Taking Active   ?lamoTRIgine (LAMICTAL) 200 MG tablet [id number] Yes Take 200 mg by mouth daily. [provider] Taking  Active   ?levothyroxine (SYNTHROID) 125 MCG tablet [id number] Yes TAKE 1 TABLET BY MOUTH DAILY BEFORE BREAKFAST. Susy Frizzle, MD Taking Active   ?losartan (COZAAR) 50 MG tablet [id number] Yes TAKE 1 TABLET

## 2022-02-20 ENCOUNTER — Telehealth: Payer: Self-pay | Admitting: Pharmacist

## 2022-02-20 NOTE — Progress Notes (Signed)
? ? ?Chronic Care Management ?Pharmacy Assistant  ? ?Name: Tony Campos  MRN: [id number] DOB: Dec 11, 1965 ? ? ?Reason for Encounter: Chart Prep for Initial visit with CPP on 02/23/22 ?  ? ?Recent office visits:  ?11/04/21 Raiford Noble, PA-C - Family Medicine (E Visit) - Viral bronchitis - benzonatate (TESSALON) 100 MG capsule and predniSONE (STERAPRED UNI-PAK 21 TAB) 10 MG (21) TBPK tablet prescribed. Follow up as needed.  ? ? ?Recent consult visits:  ?11/24/21 Margaretha Sheffield - Chronic pain syndrome - No notes available.  ? ?10/27/21 Margaretha Sheffield - Chronic pain syndrome - No notes available. ? ?09/26/21 Margaretha Sheffield - Chronic pain syndrome - No notes available. ? ?08/30/21 Larey Seat MD - Neurology - Daytime sleepiness - No changes. Follow up by phone in 4 weeks and then in office in 90 days repeat sleep study to titrate. ? ?08/25/21 Margaretha Sheffield - Chronic pain syndrome - No notes available. ? ? ?Hospital visits:  ?None in previous 6 months ? ?Medications: ?Outpatient Encounter Medications as of 02/20/2022  ?Medication Sig  ? tamsulosin (FLOMAX) 0.4 MG CAPS capsule TAKE 1 CAPSULE DAILY.  ? amphetamine-dextroamphetamine (ADDERALL) 30 MG tablet Take 30 mg by mouth daily.  ? aspirin EC 81 MG tablet Take 81 mg by mouth daily.  ? baclofen (LIORESAL) 10 MG tablet Take 10 mg by mouth 4 (four) times daily.  ? clonazePAM (KLONOPIN) 1 MG tablet Take 1 mg by mouth 2 (two) times daily.  ? DULoxetine (CYMBALTA) 60 MG capsule Take 60 mg by mouth daily.  ? furosemide (LASIX) 40 MG tablet TAKE 1 TABLET BY MOUTH TWICE DAILY AS NEEDED. (Patient taking differently: as needed. TAKE 1 TABLET BY MOUTH TWICE DAILY AS NEEDED.)  ? lamoTRIgine (LAMICTAL) 200 MG tablet Take 200 mg by mouth daily.  ? levothyroxine (SYNTHROID) 125 MCG tablet TAKE 1 TABLET BY MOUTH DAILY BEFORE BREAKFAST.  ? losartan (COZAAR) 50 MG tablet TAKE 1 TABLET ONCE DAILY.  ? metoprolol tartrate (LOPRESSOR) 25 MG tablet TAKE (1) TABLET TWICE DAILY.  ? morphine  (MS CONTIN) 15 MG 12 hr tablet Take 15 mg by mouth 3 (three) times daily.  ? MOVANTIK 25 MG TABS tablet Take 25 mg by mouth daily.  ? NARCAN 4 MG/0.1ML LIQD nasal spray kit SMARTSIG:1 Spray(s) Both Nares Once PRN  ? pantoprazole (PROTONIX) 40 MG tablet TAKE (1) TABLET TWICE DAILY.  ? pregabalin (LYRICA) 150 MG capsule Take 150 mg by mouth 2 (two) times daily.  ? rosuvastatin (CRESTOR) 40 MG tablet TAKE 1 TABLET DAILY.  ? valACYclovir (VALTREX) 500 MG tablet TAKE 1 TABLET BY MOUTH ONCE DAILY.  ? vitamin E 1000 UNIT capsule Take 1,000 Units by mouth daily.  ? [DISCONTINUED] esomeprazole (NEXIUM) 40 MG capsule Take 40 mg by mouth daily before breakfast.  ? ?No facility-administered encounter medications on file as of 02/20/2022.  ? ? ? ?Have you seen any other providers since your last visit? no ? ?Any changes in your medications or health? no ? ?Any side effects from any medications? no ? ?Do you have an symptoms or problems not managed by your medications? No, but patient reports he has been sleeping a lot for a while. He is on Adderall and states sometimes it works and sometimes it doesn't.  ? ?Any concerns about your health right now? no ? ?Has your provider asked that you check blood pressure, blood sugar, or follow special diet at home? Yes. He checks his blood pressures at home occasionally and states there has  not been any concerns.  ? ?Do you get any type of exercise on a regular basis? no ? ?Can you think of a goal you would like to reach for your health? Patient stated he would like to work on exercising and weight loss.  ? ?Do you have any problems getting your medications? no ? ?Is there anything that you would like to discuss during the appointment?  ?Patient stated he would like to see if any of his medications could be contributing to his increased sleepiness.  ? ?Please bring medications and supplements to appointment. Patient confirmed appointment date and time.  ? ? ?Care Gaps ? ?AWV:  never ?Colonoscopy: done 07/26/20 ?DM Eye Exam: N/A ?DM Foot Exam: N/A ?Microalbumin: N/A ?HbgAIC:  N/A ?DEXA: N/A ?Mammogram: N/A ? ? ?Star Rating Drugs: ?rosuvastatin (CRESTOR) 40 MG tablet - last filled 01/02/22 30 days  ?losartan (COZAAR) 50 MG tablet - last filled 12/07/21 30 days  ? ? ?Future Appointments  ?Date Time Provider [employer]  ?02/23/2022 11:00 AM [facility] PHARMACIST [facility]  ?03/20/2022  2:00 PM Dohmeier, Asencion Partridge, MD [facility] None  ?  ? ? ?Liza Showfety, CCMA ?Clinical Pharmacist Assistant  ?[phone] ? ?

## 2022-02-21 DIAGNOSIS — M47816 Spondylosis without myelopathy or radiculopathy, lumbar region: Secondary | ICD-10-CM | POA: Diagnosis not present

## 2022-02-21 DIAGNOSIS — M47812 Spondylosis without myelopathy or radiculopathy, cervical region: Secondary | ICD-10-CM | POA: Diagnosis not present

## 2022-02-21 DIAGNOSIS — M62830 Muscle spasm of back: Secondary | ICD-10-CM | POA: Diagnosis not present

## 2022-02-21 DIAGNOSIS — G894 Chronic pain syndrome: Secondary | ICD-10-CM | POA: Diagnosis not present

## 2022-02-23 ENCOUNTER — Ambulatory Visit (INDEPENDENT_AMBULATORY_CARE_PROVIDER_SITE_OTHER): Payer: Medicare HMO | Admitting: Pharmacist

## 2022-02-23 DIAGNOSIS — G4719 Other hypersomnia: Secondary | ICD-10-CM

## 2022-02-23 DIAGNOSIS — E039 Hypothyroidism, unspecified: Secondary | ICD-10-CM

## 2022-02-23 DIAGNOSIS — F32A Depression, unspecified: Secondary | ICD-10-CM

## 2022-02-23 NOTE — Patient Instructions (Addendum)
Visit Information ? ? Goals Addressed   ? ?  ?  ?  ?  ? This Visit's Progress  ?  Track and Manage My Blood Pressure-Hypertension     ?  Timeframe:  Long-Range Goal ?Priority:  High ?Start Date:   02/23/22                          ?Expected End Date: 08/26/22                     ? ?Follow Up Date 05/26/22  ?  ?- check blood pressure weekly ?- choose a place to take my blood pressure (home, clinic or office, retail store)  ?  ?Why is this important?   ?You won't feel high blood pressure, but it can still hurt your blood vessels.  ?High blood pressure can cause heart or kidney problems. It can also cause a stroke.  ?Making lifestyle changes like losing a little weight or eating less salt will help.  ?Checking your blood pressure at home and at different times of the day can help to control blood pressure.  ?If the doctor prescribes medicine remember to take it the way the doctor ordered.  ?Call the office if you cannot afford the medicine or if there are questions about it.   ?  ?Notes:  ?  ? ?  ? ?Patient Care Plan: General Pharmacy (Adult)  ?  ? ?Problem Identified: HTN, Anxiety, Depression, HLD, Sleep apnea   ?Priority: High  ?Onset Date: 02/23/2022  ?  ? ?Long-Range Goal: Patient-Specific Goal   ?Start Date: 02/23/2022  ?Expected End Date: 08/26/2022  ?This Visit's Progress: On track  ?Priority: High  ?Note:   ?Current Barriers:  ?Unable to maintain control of pain and daytime sleepiness at the same time. ? ?Pharmacist Clinical Goal(s):  ?Patient will achieve improvement in drowsiness as evidenced by ability to do things during the day through collaboration with PharmD and provider.  ? ?Interventions: ?1:1 collaboration with Tony Frizzle, MD regarding development and update of comprehensive plan of care as evidenced by provider attestation and co-signature ?Inter-disciplinary care team collaboration (see longitudinal plan of care) ?Comprehensive medication review performed; medication list updated in electronic  medical record ? ?Hypertension (BP goal <130/80) ?-Controlled ?-Current treatment: ?Losartan '50mg'$  daily Appropriate, Effective, Safe, Accessible ?Metoprolol tartrate '25mg'$  twice daily Appropriate, Effective, Safe, Accessible ?-Medications previously tried: none noted  ?-Current home readings: not checking often at home due to multiple OV's at various doctors - controlled at office visits ?-Current dietary habits: not much of an appetite at home - either eats frozen meals or eats out.  Eats home cooked meals when he goes to his parents house ?-Current exercise habits: minimal due to pain and sleepiness ?-Denies hypotensive/hypertensive symptoms ?-Educated on BP goals and benefits of medications for prevention of heart attack, stroke and kidney damage; ?Exercise goal of 150 minutes per week; ?Symptoms of hypotension and importance of maintaining adequate hydration; ?-Counseled to monitor BP at home as able, document, and provide log at future appointments ?-Recommended to continue current medication ?No changes at this time ? ?Hyperlipidemia: (LDL goal < 100) ?-Controlled ?-Current treatment: ?Rosuvastatin '40mg'$  daily Appropriate, Effective, Safe, Accessible ? ?-Medications previously tried: none noted  ?-Current dietary patterns: see HTN ?-Current exercise habits: minimal ?-Educated on Cholesterol goals;  ?Benefits of statin for ASCVD risk reduction; ?Importance of limiting foods high in cholesterol; ?-Recommended to continue current medication ?LDL well controlled, no  adverse effects with medication ? ?Depression/Anxiety (Goal: Reduce symptoms) ?-Uncontrolled ?-Current treatment: ?Rexulti '1mg'$  daily Appropriate, Query effective,  ?Duloxetine '60mg'$  Appropriate, Query effective,  ?-Medications previously tried/failed: none noted ?-PHQ9:  ? ?  03/31/2021  ?  9:28 AM 06/10/2018  ?  4:24 PM 12/09/2015  ? 11:05 AM  ?PHQ9 SCORE ONLY  ?PHQ-9 Total Score '21 2 21  '$ ? ?-GAD7:  ?   ? View : No data to display.  ?  ?  ?  ?-Seen by Dr.  Noemi Campos for psych meds.  He reports excessive daytime sleepiness and ongoing depression symptoms.  Possibly uncontrolled depression contributing to sleepiness as well as combination of pain meds that can all cause drowsiness. ?-Educated on Benefits of medication for symptom control ?Could discuss increase of Rexulti dose with Dr. Dorethea Campos to see if this helps with lingering depression symptoms. ?-Recommended to continue current medication ?Continue for now, discuss dose increase with Psych at next visit. ? ?Hypothyroidism (Goal: Maintain TSH) ?-Controlled ?-Current treatment  ?Levothyroxine 110mg daily Appropriate, Effective, Safe, Accessible ?-Medications previously tried: none noted ?-Takes appropriately in the AM on empty stomach 30-60 minutes before a meal or other meds ?-TSH is WNL  ?-Recommended to continue current medication ?No changes at this time, recheck TSH at next routine physical ? ?Back Pain (Goal: Control pain) ?-Controlled ?-Current treatment  ?Morphine ER '15mg'$  three times per day Appropriate, Effective, Safe, Accessible ?Lyrica '150mg'$  twice daily Appropriate, Effective, Safe, Accessible ?Baclofen '10mg'$  twice daily Appropriate, Effective, Safe, Accessible ?-Medications previously tried: methocarbamol, fentanyl ?-Managed by pain management ?-Combination of these medication are definitely cause of his daytime sleepiness.  He has tried other muscle relaxers that maybe do not cause as much drowsiness.  If he misses some of these day time pain medications his pain is unbearable.  Unfortunately I am not sure if there is many alternatives with his pain to control it and increase awareness during the day.  Will continue to follow and address as needed ?-Recommended to continue current medication ? ?Patient Goals/Self-Care Activities ?Patient will:  ?- focus on medication adherence by pill counts ?target a minimum of 150 minutes of moderate intensity exercise weekly ? ?Follow Up Plan: The care management  team will reach out to the patient again over the next 180 days.  ?  ? ? ?Tony Campos. CPotempawas given information about Chronic Care Management services today including:  ?CCM service includes personalized support from designated clinical staff supervised by his physician, including individualized plan of care and coordination with other care providers ?24/7 contact phone numbers for assistance for urgent and routine care needs. ?Standard insurance, coinsurance, copays and deductibles apply for chronic care management only during months in which we provide at least 20 minutes of these services. Most insurances cover these services at 100%, however patients may be responsible for any copay, coinsurance and/or deductible if applicable. This service may help you avoid the need for more expensive face-to-face services. ?Only one practitioner may furnish and bill the service in a calendar month. ?The patient may stop CCM services at any time (effective at the end of the month) by phone call to the office staff. ? ?Patient agreed to services and verbal consent obtained.  ? ?The patient verbalized understanding of instructions, educational materials, and care plan provided today and agreed to receive a mailed copy of patient instructions, educational materials, and care plan.  ?Telephone follow up appointment with pharmacy team member scheduled for: 6 months ? ?[facility]Edythe Campos [facility] ?CBeverly Campos  PharmD, CPP ?Clinical Pharmacist Practitioner ?[place] ?[phone] ? ?

## 2022-02-24 DIAGNOSIS — E785 Hyperlipidemia, unspecified: Secondary | ICD-10-CM

## 2022-02-24 DIAGNOSIS — F418 Other specified anxiety disorders: Secondary | ICD-10-CM

## 2022-02-24 DIAGNOSIS — E039 Hypothyroidism, unspecified: Secondary | ICD-10-CM | POA: Diagnosis not present

## 2022-02-24 DIAGNOSIS — R69 Illness, unspecified: Secondary | ICD-10-CM | POA: Diagnosis not present

## 2022-02-24 DIAGNOSIS — I10 Essential (primary) hypertension: Secondary | ICD-10-CM

## 2022-02-24 DIAGNOSIS — F17210 Nicotine dependence, cigarettes, uncomplicated: Secondary | ICD-10-CM

## 2022-02-27 ENCOUNTER — Institutional Professional Consult (permissible substitution): Payer: Medicare HMO | Admitting: Neurology

## 2022-03-13 ENCOUNTER — Other Ambulatory Visit: Payer: Self-pay

## 2022-03-13 ENCOUNTER — Other Ambulatory Visit: Payer: Self-pay | Admitting: Family Medicine

## 2022-03-13 ENCOUNTER — Telehealth: Payer: Self-pay

## 2022-03-13 DIAGNOSIS — R000 Tachycardia, unspecified: Secondary | ICD-10-CM

## 2022-03-13 NOTE — Telephone Encounter (Signed)
Pt called in stating that he tested postivie for Covid. Pt states that he has a sore throat, cough, and  light headedness. Please advise as to what pt can do. Please advise. ? ?Cb#: [phone] ?

## 2022-03-14 ENCOUNTER — Other Ambulatory Visit: Payer: Self-pay | Admitting: Family Medicine

## 2022-03-14 ENCOUNTER — Telehealth: Payer: Medicare HMO | Admitting: Family Medicine

## 2022-03-14 DIAGNOSIS — U071 COVID-19: Secondary | ICD-10-CM | POA: Diagnosis not present

## 2022-03-14 MED ORDER — MOLNUPIRAVIR EUA 200MG CAPSULE: 4.0000 | ORAL_CAPSULE | Freq: Two times a day (BID) | ORAL | 0 refills | Status: AC

## 2022-03-14 MED ORDER — NIRMATRELVIR/RITONAVIR (PAXLOVID)TABLET: 3.0000 | ORAL_TABLET | Freq: Two times a day (BID) | ORAL | 0 refills | Status: DC

## 2022-03-14 NOTE — Patient Instructions (Signed)
COVID-19: What to Do if You Are Sick ?If you test positive and are an older adult or someone who is at high risk of getting very sick from COVID-19, treatment may be available. Contact a healthcare provider right away after a positive test to determine if you are eligible, even if your symptoms are mild right now. You can also visit a Test to Treat location and, if eligible, receive a prescription from a provider. Don't delay: Treatment must be started within the first few days to be effective. ?If you have a fever, cough, or other symptoms, you might have COVID-19. Most people have mild illness and are able to recover at home. If you are sick: ?Keep track of your symptoms. ?If you have an emergency warning sign (including trouble breathing), call 911. ?Steps to help prevent the spread of COVID-19 if you are sick ?If you are sick with COVID-19 or think you might have COVID-19, follow the steps below to care for yourself and to help protect other people in your home and community. ?Stay home except to get medical care ?Stay home. Most people with COVID-19 have mild illness and can recover at home without medical care. Do not leave your home, except to get medical care. Do not visit public areas and do not go to places where you are unable to wear a mask. ?Take care of yourself. Get rest and stay hydrated. Take over-the-counter medicines, such as acetaminophen, to help you feel better. ?Stay in touch with your doctor. Call before you get medical care. Be sure to get care if you have trouble breathing, or have any other emergency warning signs, or if you think it is an emergency. ?Avoid public transportation, ride-sharing, or taxis if possible. ?Get tested ?If you have symptoms of COVID-19, get tested. While waiting for test results, stay away from others, including staying apart from those living in your household. ?Get tested as soon as possible after your symptoms start. Treatments may be available for people with  COVID-19 who are at risk for becoming very sick. Don't delay: Treatment must be started early to be effective--some treatments must begin within 5 days of your first symptoms. Contact your healthcare provider right away if your test result is positive to determine if you are eligible. ?Self-tests are one of several options for testing for the virus that causes COVID-19 and may be more convenient than laboratory-based tests and point-of-care tests. Ask your healthcare provider or your local health department if you need help interpreting your test results. ?You can visit your state, tribal, local, and territorial health department's website to look for the latest local information on testing sites. ?Separate yourself from other people ?As much as possible, stay in a specific room and away from other people and pets in your home. If possible, you should use a separate bathroom. If you need to be around other people or animals in or outside of the home, wear a well-fitting mask. ?Tell your close contacts that they may have been exposed to COVID-19. An infected person can spread COVID-19 starting 48 hours (or 2 days) before the person has any symptoms or tests positive. By letting your close contacts know they may have been exposed to COVID-19, you are helping to protect everyone. ?See COVID-19 and Animals if you have questions about pets. ?If you are diagnosed with COVID-19, someone from the health department may call you. Answer the call to slow the spread. ?Monitor your symptoms ?Symptoms of COVID-19 include fever, cough, or other  symptoms. ?Follow care instructions from your healthcare provider and local health department. Your local health authorities may give instructions on checking your symptoms and reporting information. ?When to seek emergency medical attention ?Look for emergency warning signs* for COVID-19. If someone is showing any of these signs, seek emergency medical care immediately: ?Trouble  breathing ?Persistent pain or pressure in the chest ?New confusion ?Inability to wake or stay awake ?Pale, gray, or blue-colored skin, lips, or nail beds, depending on skin tone ?*This list is not all possible symptoms. Please call your medical provider for any other symptoms that are severe or concerning to you. ?Call 911 or call ahead to your local emergency facility: Notify the operator that you are seeking care for someone who has or may have COVID-19. ?Call ahead before visiting your doctor ?Call ahead. Many medical visits for routine care are being postponed or done by phone or telemedicine. ?If you have a medical appointment that cannot be postponed, call your doctor's office, and tell them you have or may have COVID-19. This will help the office protect themselves and other patients. ?If you are sick, wear a well-fitting mask ?You should wear a mask if you must be around other people or animals, including pets (even at home). ?Wear a mask with the best fit, protection, and comfort for you. ?You don't need to wear the mask if you are alone. If you can't put on a mask (because of trouble breathing, for example), cover your coughs and sneezes in some other way. Try to stay at least 6 feet away from other people. This will help protect the people around you. ?Masks should not be placed on young children under age 30 years, anyone who has trouble breathing, or anyone who is not able to remove the mask without help. ?Cover your coughs and sneezes ?Cover your mouth and nose with a tissue when you cough or sneeze. ?Throw away used tissues in a lined trash can. ?Immediately wash your hands with soap and water for at least 20 seconds. If soap and water are not available, clean your hands with an alcohol-based hand sanitizer that contains at least 60% alcohol. ?Clean your hands often ?Wash your hands often with soap and water for at least 20 seconds. This is especially important after blowing your nose, coughing, or  sneezing; going to the bathroom; and before eating or preparing food. ?Use hand sanitizer if soap and water are not available. Use an alcohol-based hand sanitizer with at least 60% alcohol, covering all surfaces of your hands and rubbing them together until they feel dry. ?Soap and water are the best option, especially if hands are visibly dirty. ?Avoid touching your eyes, nose, and mouth with unwashed hands. ?Handwashing Tips ?Avoid sharing personal household items ?Do not share dishes, drinking glasses, cups, eating utensils, towels, or bedding with other people in your home. ?Wash these items thoroughly after using them with soap and water or put in the dishwasher. ?Clean surfaces in your home regularly ?Clean and disinfect high-touch surfaces (for example, doorknobs, tables, handles, light switches, and countertops) in your "sick room" and bathroom. In shared spaces, you should clean and disinfect surfaces and items after each use by the person who is ill. ?If you are sick and cannot clean, a caregiver or other person should only clean and disinfect the area around you (such as your bedroom and bathroom) on an as needed basis. Your caregiver/other person should wait as long as possible (at least several hours) and wear a  mask before entering, cleaning, and disinfecting shared spaces that you use. ?Clean and disinfect areas that may have blood, stool, or body fluids on them. ?Use household cleaners and disinfectants. Clean visible dirty surfaces with household cleaners containing soap or detergent. Then, use a household disinfectant. ?Use a product from [employer] List N: Disinfectants for Coronavirus (LNLGX-21). ?Be sure to follow the instructions on the label to ensure safe and effective use of the product. Many products recommend keeping the surface wet with a disinfectant for a certain period of time (look at "contact time" on the product label). ?You may also need to wear personal protective equipment, such as  gloves, depending on the directions on the product label. ?Immediately after disinfecting, wash your hands with soap and water for 20 seconds. ?For completed guidance on cleaning and disinfecting your home, visit Com

## 2022-03-14 NOTE — Telephone Encounter (Signed)
Per chart patient seen in video visit today and prescribed Molnupiravir. Nothing further needed at this time.  ? ? ?
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
## 2022-03-14 NOTE — Progress Notes (Signed)
[place]  ? ?COVID need VV  ?Message sent on mychart  ?

## 2022-03-20 ENCOUNTER — Ambulatory Visit: Payer: Medicare HMO | Admitting: Neurology

## 2022-03-20 ENCOUNTER — Telehealth: Payer: Self-pay

## 2022-03-20 DIAGNOSIS — Z9682 Presence of neurostimulator: Secondary | ICD-10-CM | POA: Diagnosis not present

## 2022-03-20 DIAGNOSIS — G4733 Obstructive sleep apnea (adult) (pediatric): Secondary | ICD-10-CM | POA: Diagnosis not present

## 2022-03-20 DIAGNOSIS — R69 Illness, unspecified: Secondary | ICD-10-CM | POA: Diagnosis not present

## 2022-03-20 DIAGNOSIS — F11288 Opioid dependence with other opioid-induced disorder: Secondary | ICD-10-CM

## 2022-03-20 DIAGNOSIS — G4734 Idiopathic sleep related nonobstructive alveolar hypoventilation: Secondary | ICD-10-CM | POA: Diagnosis not present

## 2022-03-20 DIAGNOSIS — Z789 Other specified health status: Secondary | ICD-10-CM
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
## 2022-03-21 DIAGNOSIS — G894 Chronic pain syndrome: Secondary | ICD-10-CM | POA: Diagnosis not present

## 2022-03-21 DIAGNOSIS — Z79891 Long term (current) use of opiate analgesic: Secondary | ICD-10-CM | POA: Diagnosis not present

## 2022-03-21 DIAGNOSIS — M47812 Spondylosis without myelopathy or radiculopathy, cervical region: Secondary | ICD-10-CM | POA: Diagnosis not present

## 2022-03-21 DIAGNOSIS — M47816 Spondylosis without myelopathy or radiculopathy, lumbar region: Secondary | ICD-10-CM | POA: Diagnosis not present

## 2022-03-21 DIAGNOSIS — M62830 Muscle spasm of back: Secondary | ICD-10-CM | POA: Diagnosis not present

## 2022-03-21 NOTE — Progress Notes (Signed)
CM sent to [facility] for new order ?

## 2022-03-23 ENCOUNTER — Other Ambulatory Visit: Payer: Self-pay | Admitting: Family Medicine

## 2022-03-23 ENCOUNTER — Other Ambulatory Visit: Payer: Self-pay

## 2022-03-24 ENCOUNTER — Ambulatory Visit (INDEPENDENT_AMBULATORY_CARE_PROVIDER_SITE_OTHER): Payer: Medicare HMO | Admitting: Family Medicine

## 2022-03-24 VITALS — BP 94/60 | HR 75 | Temp 98.0°F | Ht 72.0 in | Wt 242.0 lb

## 2022-03-24 DIAGNOSIS — R55 Syncope and collapse: Secondary | ICD-10-CM

## 2022-03-24 LAB — CBC WITH DIFFERENTIAL/PLATELET
Absolute Monocytes: 763 cells/uL (ref 200–950)
Basophils Absolute: 42 cells/uL (ref 0–200)
Basophils Relative: 0.4 %
Eosinophils Absolute: 0 cells/uL — ABNORMAL LOW (ref 15–500)
Eosinophils Relative: 0 %
HCT: 36.9 % — ABNORMAL LOW (ref 38.5–50.0)
Hemoglobin: 12.1 g/dL — ABNORMAL LOW (ref 13.2–17.1)
Lymphs Abs: 1919 cells/uL (ref 850–3900)
MCH: 29.5 pg (ref 27.0–33.0)
MCHC: 32.8 g/dL (ref 32.0–36.0)
MCV: 90 fL (ref 80.0–100.0)
MPV: 9.9 fL (ref 7.5–12.5)
Monocytes Relative: 7.2 %
Neutro Abs: 7876 cells/uL — ABNORMAL HIGH (ref 1500–7800)
Neutrophils Relative %: 74.3 %
Platelets: 206 10*3/uL (ref 140–400)
RBC: 4.1 10*6/uL — ABNORMAL LOW (ref 4.20–5.80)
RDW: 13.4 % (ref 11.0–15.0)
Total Lymphocyte: 18.1 %
WBC: 10.6 10*3/uL (ref 3.8–10.8)

## 2022-03-24 NOTE — Progress Notes (Signed)
? ?Subjective:  ? ? Patient ID: Tony Campos, male    DOB: Nov 05, 1966, 56 y.o.   MRN: [id number] ?Patient recently had COVID.  On Monday, he was sitting on his porch.  He stood up to go inside and he suddenly felt very lightheaded and then he passed out.  It happened again later Monday.  He was standing in the bedroom talking and he felt extremely lightheaded and passed out.  He denies any chest pain or palpitations or tachycardia or irregular heartbeats.  He denies any pleurisy or hemoptysis.  His blood pressure today is extremely low with systolic blood pressures around 90 so I suspect hypotension.  He denies any seizure activity. ?Past Medical History:  ?Diagnosis Date  ? AAA (abdominal aortic aneurysm) without rupture ([place])   ? 3.4 cm found on mri 2/20  ? Acid reflux   ? Anemia   ? Anxiety   ? Aortic atherosclerosis ([place])   ? Bipolar disorder ([place])   ? Depression   ? Diverticulitis   ? Dyspnea   ? exertional  ? Genital warts   ? Hyperlipidemia   ? Hypertension   ? Hypothyroidism   ? Meningitis   ? PAD (peripheral artery disease) ([place])   ? femoral artery angioplasy/stent  ? SIADH (syndrome of inappropriate ADH production) ([place])   ? Sleep apnea 12/2015  ? Smoker   ? Smoker   ? Spondylosis   ? Testosterone deficiency   ? ?Past Surgical History:  ?Procedure Laterality Date  ? APPENDECTOMY  12/2018  ? at [facility]  ? CHOLECYSTECTOMY  2007  ? CLOSED REDUCTION MANDIBLE N/A 03/09/2018  ? Procedure: CLOSED REDUCTION MANDIBULAR MMF;  Surgeon: Jerrell Belfast, MD;  Location: [place];  Service: ENT;  Laterality: N/A;  ? COLONOSCOPY  07/26/2020  ? 10/22/2018  ? DRUG INDUCED ENDOSCOPY N/A 04/06/2021  ? Procedure: DRUG INDUCED ENDOSCOPY;  Surgeon: Melida Quitter, MD;  Location: [place];  Service: ENT;  Laterality: N/A;  ? IMPLANTATION OF HYPOGLOSSAL NERVE STIMULATOR Right 07/20/2021  ? Procedure: IMPLANTATION OF HYPOGLOSSAL NERVE STIMULATOR;  Surgeon: Melida Quitter, MD;  Location: [place];   Service: ENT;  Laterality: Right;  ? KNEE SURGERY    ? right  ? LAPAROSCOPIC APPENDECTOMY N/A 07/25/2018  ? Procedure: APPENDECTOMY LAPAROSCOPIC;  Surgeon: Aviva Signs, MD;  Location: [facility];  Service: General;  Laterality: N/A;  ? MANDIBULAR HARDWARE REMOVAL N/A 05/06/2018  ? Procedure: REMOVAL OF MMF HARDWARE;  Surgeon: Jerrell Belfast, MD;  Location: [place];  Service: ENT;  Laterality: N/A;  ? NASAL SEPTUM SURGERY    ? NASAL SINUS SURGERY    ? POLYPECTOMY    ? right sfa recanalization and stenting     ? [facility] 12/2015  ? UPPER GASTROINTESTINAL ENDOSCOPY  07/26/2020  ? WRIST SURGERY    ? right and left  ? ?Current Outpatient Medications on File Prior to Visit  ?Medication Sig Dispense Refill  ? amphetamine-dextroamphetamine (ADDERALL) 30 MG tablet Take 30 mg by mouth daily.    ? aspirin EC 81 MG tablet Take 81 mg by mouth daily.    ? baclofen (LIORESAL) 10 MG tablet Take 10 mg by mouth 4 (four) times daily.    ? clonazePAM (KLONOPIN) 1 MG tablet Take 1 mg by mouth 2 (two) times daily.    ? diclofenac Sodium (VOLTAREN) 1 % GEL Apply topically.    ? DULoxetine (CYMBALTA) 60 MG capsule Take 60 mg by mouth daily.    ?  lamoTRIgine (LAMICTAL) 200 MG tablet Take 200 mg by mouth daily.    ? levothyroxine (SYNTHROID) 125 MCG tablet TAKE 1 TABLET BY MOUTH DAILY BEFORE BREAKFAST. 90 tablet 1  ? losartan (COZAAR) 50 MG tablet TAKE 1 TABLET ONCE DAILY. 30 tablet 1  ? metoprolol tartrate (LOPRESSOR) 25 MG tablet TAKE (1) TABLET TWICE DAILY. 60 tablet 1  ? morphine (MS CONTIN) 15 MG 12 hr tablet Take 15 mg by mouth 3 (three) times daily.    ? NARCAN 4 MG/0.1ML LIQD nasal spray kit SMARTSIG:1 Spray(s) Both Nares Once PRN    ? pantoprazole (PROTONIX) 40 MG tablet TAKE (1) TABLET TWICE DAILY. 180 tablet 3  ? pregabalin (LYRICA) 150 MG capsule Take 150 mg by mouth 2 (two) times daily.    ? REXULTI 1 MG TABS tablet Take 1 mg by mouth daily.    ? rosuvastatin (CRESTOR) 40 MG tablet TAKE 1 TABLET DAILY. 30 tablet 1   ? tamsulosin (FLOMAX) 0.4 MG CAPS capsule TAKE 1 CAPSULE DAILY. 30 capsule 1  ? valACYclovir (VALTREX) 500 MG tablet TAKE 1 TABLET BY MOUTH ONCE DAILY. 30 tablet 1  ? vitamin E 1000 UNIT capsule Take 1,000 Units by mouth daily.    ? [DISCONTINUED] esomeprazole (NEXIUM) 40 MG capsule Take 40 mg by mouth daily before breakfast.    ? ?No current facility-administered medications on file prior to visit.  ? ? ?No Known Allergies ?Social History  ? ?Socioeconomic History  ? Marital status: Married  ?  Spouse name: Not on file  ? Number of children: 0  ? Years of education: Not on file  ? Highest education level: Not on file  ?Occupational History  ? Not on file  ?Tobacco Use  ? Smoking status: Some Days  ?  Packs/day: 0.25  ?  Types: Cigarettes  ? Smokeless tobacco: Never  ?Vaping Use  ? Vaping Use: Every day  ?Substance and Sexual Activity  ? Alcohol use: No  ? Drug use: No  ? Sexual activity: Not Currently  ?Other Topics Concern  ? Not on file  ?Social History Narrative  ? Not on file  ? ?Social Determinants of Health  ? ?Financial Resource Strain: Not on file  ?Food Insecurity: Not on file  ?Transportation Needs: Not on file  ?Physical Activity: Not on file  ?Stress: Not on file  ?Social Connections: Not on file  ?Intimate Partner Violence: Not on file  ? ? ? ? ?Review of Systems  ?All other systems reviewed and are negative. ? ?   ?Objective:  ? Physical Exam ?Vitals reviewed.  ?Constitutional:   ?   General: He is not in acute distress. ?   Appearance: Normal appearance. He is not ill-appearing, toxic-appearing or diaphoretic.  ?HENT:  ?   Head: Normocephalic and atraumatic.  ?Cardiovascular:  ?   Rate and Rhythm: Normal rate and regular rhythm.  ?   Heart sounds: Normal heart sounds. No murmur heard. ?  No friction rub. No gallop.  ?Pulmonary:  ?   Effort: Pulmonary effort is normal.  ?   Breath sounds: No wheezing or rhonchi.  ?Musculoskeletal:  ?   Right lower leg: No edema.  ?   Left lower leg: No edema.   ?Neurological:  ?   General: No focal deficit present.  ?   Mental Status: He is alert and oriented to person, place, and time.  ? ? ? ?   ?Assessment & Plan:  ?Syncope, unspecified syncope type - Plan: EKG  12-Lead ?I suspect syncope due to hypotension.  I will obtain an EKG to evaluate for any cardiac arrhythmias.  EKG shows NSR with normal intervals and normal axis.  Recommended reducing his metoprolol by 50% and holding his losartan temporarily while he pushes fluid.  Check CBC to rule out anemia.  Check CMP to rule out electrolyte disturbance with.  Given his recent diagnosis of COVID I will check a D-dimer and if his D-dimer is elevated I will check a CT of the chest to rule out pulmonary embolism however he is completely asymptomatic with no chest pain pleurisy or hemoptysis.  I suspect hypotension due to medication and dehydration. ?

## 2022-03-25 ENCOUNTER — Encounter: Payer: Self-pay | Admitting: Family Medicine

## 2022-03-25 LAB — COMPLETE METABOLIC PANEL WITH GFR
AG Ratio: 2.2 (calc) (ref 1.0–2.5)
ALT: 6 U/L — ABNORMAL LOW (ref 9–46)
AST: 13 U/L (ref 10–35)
Albumin: 3.9 g/dL (ref 3.6–5.1)
Alkaline phosphatase (APISO): 77 U/L (ref 35–144)
BUN: 8 mg/dL (ref 7–25)
CO2: 31 mmol/L (ref 20–32)
Calcium: 9.1 mg/dL (ref 8.6–10.3)
Chloride: 106 mmol/L (ref 98–110)
Creat: 1.26 mg/dL (ref 0.70–1.30)
Globulin: 1.8 g/dL (calc) — ABNORMAL LOW (ref 1.9–3.7)
Glucose, Bld: 98 mg/dL (ref 65–99)
Potassium: 4.8 mmol/L (ref 3.5–5.3)
Sodium: 147 mmol/L — ABNORMAL HIGH (ref 135–146)
Total Bilirubin: 0.7 mg/dL (ref 0.2–1.2)
Total Protein: 5.7 g/dL — ABNORMAL LOW (ref 6.1–8.1)
eGFR: 68 mL/min/{1.73_m2} (ref >=60)

## 2022-03-25 LAB — D-DIMER, QUANTITATIVE: D-Dimer, Quant: 0.45 mcg/mL FEU (ref <0.50)

## 2022-03-27 ENCOUNTER — Ambulatory Visit: Payer: Self-pay | Admitting: *Deleted

## 2022-03-27 NOTE — Telephone Encounter (Signed)
Per agent" ?"Pt called in stating that he saw that he had a message in MyChart about his lab results, and would like someone to give him a call back to discuss them. Please advise.  ? ?Cb#: [phone] " ? ? ?Pt calling to discuss lab results , viewed on MyChart. Message from  Dr. Dennard Schaumann reviewed with pt. States "My wife saw somewhere I was anemic, what should we do about that?" While looking for note regarding anemia, pt stated office was calling on other phone. Advised to take call.  ? ? ?

## 2022-03-30 ENCOUNTER — Ambulatory Visit (INDEPENDENT_AMBULATORY_CARE_PROVIDER_SITE_OTHER): Payer: Medicare HMO | Admitting: Family Medicine

## 2022-03-30 VITALS — BP 142/90 | HR 73 | Temp 98.7°F | Ht 73.0 in | Wt 245.4 lb

## 2022-03-30 DIAGNOSIS — D649 Anemia, unspecified: Secondary | ICD-10-CM

## 2022-03-30 NOTE — Progress Notes (Signed)
? ?Subjective:  ? ? Patient ID: Tony Campos, male    DOB: 1966/10/13, 56 y.o.   MRN: [id number] ?03/24/22 ?Patient recently had COVID.  On Monday, he was sitting on his porch.  He stood up to go inside and he suddenly felt very lightheaded and then he passed out.  It happened again later Monday.  He was standing in the bedroom talking and he felt extremely lightheaded and passed out.  He denies any chest pain or palpitations or tachycardia or irregular heartbeats.  He denies any pleurisy or hemoptysis.  His blood pressure today is extremely low with systolic blood pressures around 90 so I suspect hypotension.  He denies any seizure activity.  At that time, my plan was: ? ?I suspect syncope due to hypotension.  I will obtain an EKG to evaluate for any cardiac arrhythmias.  EKG shows NSR with normal intervals and normal axis.  Recommended reducing his metoprolol by 50% and holding his losartan temporarily while he pushes fluid.  Check CBC to rule out anemia.  Check CMP to rule out electrolyte disturbance with.  Given his recent diagnosis of COVID I will check a D-dimer and if his D-dimer is elevated I will check a CT of the chest to rule out pulmonary embolism however he is completely asymptomatic with no chest pain pleurisy or hemoptysis.  I suspect hypotension due to medication and dehydration. ? ?03/30/22 ?Labs were unremarkable including normal d-dimer.  ?Office Visit on 03/24/2022  ?Component Date Value Ref Range Status  ? WBC 03/24/2022 10.6  3.8 - 10.8 Thousand/uL Final  ? RBC 03/24/2022 4.10 (L)  4.20 - 5.80 Million/uL Final  ? Hemoglobin 03/24/2022 12.1 (L)  13.2 - 17.1 g/dL Final  ? HCT 03/24/2022 36.9 (L)  38.5 - 50.0 % Final  ? MCV 03/24/2022 90.0  80.0 - 100.0 fL Final  ? MCH 03/24/2022 29.5  27.0 - 33.0 pg Final  ? MCHC 03/24/2022 32.8  32.0 - 36.0 g/dL Final  ? RDW 03/24/2022 13.4  11.0 - 15.0 % Final  ? Platelets 03/24/2022 206  140 - 400 Thousand/uL Final  ? MPV 03/24/2022 9.9  7.5 - 12.5 fL Final  ?  Neutro Abs 03/24/2022 7,876 (H)  1,500 - 7,800 cells/uL Final  ? Lymphs Abs 03/24/2022 1,919  850 - 3,900 cells/uL Final  ? Absolute Monocytes 03/24/2022 763  200 - 950 cells/uL Final  ? Eosinophils Absolute 03/24/2022 0 (L)  15 - 500 cells/uL Final  ? Basophils Absolute 03/24/2022 42  0 - 200 cells/uL Final  ? Neutrophils Relative % 03/24/2022 74.3  % Final  ? Total Lymphocyte 03/24/2022 18.1  % Final  ? Monocytes Relative 03/24/2022 7.2  % Final  ? Eosinophils Relative 03/24/2022 0.0  % Final  ? Basophils Relative 03/24/2022 0.4  % Final  ? Glucose, Bld 03/24/2022 98  65 - 99 mg/dL Final  ? Comment: . ?           Fasting reference interval ?. ?  ? BUN 03/24/2022 8  7 - 25 mg/dL Final  ? Creat 03/24/2022 1.26  0.70 - 1.30 mg/dL Final  ? eGFR 03/24/2022 68  > OR = 60 mL/min/1.76m Final  ? Comment: The eGFR is based on the CKD-EPI 2021 equation. To calculate  ?the new eGFR from a previous Creatinine or Cystatin C ?result, go to https://www.kidney.org/professionals/ ?kdoqi/gfr%5Fcalculator ?  ? BUN/Creatinine Ratio 023/53/6144NOT APPLICABLE  6 - 22 (calc) Final  ? Sodium 03/24/2022 147 (H)  135 -  146 mmol/L Final  ? Potassium 03/24/2022 4.8  3.5 - 5.3 mmol/L Final  ? Chloride 03/24/2022 106  98 - 110 mmol/L Final  ? CO2 03/24/2022 31  20 - 32 mmol/L Final  ? Calcium 03/24/2022 9.1  8.6 - 10.3 mg/dL Final  ? Total Protein 03/24/2022 5.7 (L)  6.1 - 8.1 g/dL Final  ? Albumin 03/24/2022 3.9  3.6 - 5.1 g/dL Final  ? Globulin 03/24/2022 1.8 (L)  1.9 - 3.7 g/dL (calc) Final  ? AG Ratio 03/24/2022 2.2  1.0 - 2.5 (calc) Final  ? Total Bilirubin 03/24/2022 0.7  0.2 - 1.2 mg/dL Final  ? Alkaline phosphatase (APISO) 03/24/2022 77  35 - 144 U/L Final  ? AST 03/24/2022 13  10 - 35 U/L Final  ? ALT 03/24/2022 6 (L)  9 - 46 U/L Final  ? D-Dimer, Quant 03/24/2022 0.45  <0.50 mcg/mL FEU Final  ? Comment: . ?The D-Dimer test is used frequently to exclude ?an acute PE or DVT. In patients with a low to ?moderate clinical risk assessment  and a D-Dimer ?result <0.50 mcg/mL FEU, the likelihood of a PE ?or DVT is very low. However, a thromboembolic ?event should not be excluded solely on the basis ?of the D-Dimer level. Increased levels of D-Dimer ?are associated with a PE, DVT, DIC, malignancies, ?inflammation, sepsis, surgery, trauma, pregnancy, ?and advancing patient age. ?[Jama 2006 11:295(2):199-207] ?Marland Kitchen ?For additional information, please refer to: ?http://education.questdiagnostics.com/faq/FAQ149 ?(This link is being provided for informational/ ?educational purposes only) ?. ?  ? ?Thankfully, the patient has not had any further syncopal episodes.  His blood pressure is much improved today and is in fact a little bit high.  He states that he had an episode where he became lightheaded last week and they checked his blood pressure and it was a little low.  However this seems to have normalized now.  He is holding losartan and taking a half a pill of metoprolol twice a day.  His labs did show a mild anemia.  I suspect the anemia of chronic disease although he does have a history of GI bleed in the past.  He is getting colonoscopies every 2 years and his last colonoscopy was in 2021.  He denies any visible bleeding. ?Past Medical History:  ?Diagnosis Date  ? AAA (abdominal aortic aneurysm) without rupture ([place])   ? 3.4 cm found on mri 2/20  ? Acid reflux   ? Anemia   ? Anxiety   ? Aortic atherosclerosis ([place])   ? Bipolar disorder ([place])   ? Depression   ? Diverticulitis   ? Dyspnea   ? exertional  ? Genital warts   ? Hyperlipidemia   ? Hypertension   ? Hypothyroidism   ? Meningitis   ? PAD (peripheral artery disease) ([place])   ? femoral artery angioplasy/stent  ? SIADH (syndrome of inappropriate ADH production) ([place])   ? Sleep apnea 12/2015  ? Smoker   ? Smoker   ? Spondylosis   ? Testosterone deficiency   ? ?Past Surgical History:  ?Procedure Laterality Date  ? APPENDECTOMY  12/2018  ? at [facility]  ? CHOLECYSTECTOMY  2007  ? CLOSED REDUCTION MANDIBLE  N/A 03/09/2018  ? Procedure: CLOSED REDUCTION MANDIBULAR MMF;  Surgeon: Jerrell Belfast, MD;  Location: [place];  Service: ENT;  Laterality: N/A;  ? COLONOSCOPY  07/26/2020  ? 10/22/2018  ? DRUG INDUCED ENDOSCOPY N/A 04/06/2021  ? Procedure: DRUG INDUCED ENDOSCOPY;  Surgeon: Melida Quitter, MD;  Location: [facility]  [place];  Service: ENT;  Laterality: N/A;  ? IMPLANTATION OF HYPOGLOSSAL NERVE STIMULATOR Right 07/20/2021  ? Procedure: IMPLANTATION OF HYPOGLOSSAL NERVE STIMULATOR;  Surgeon: Bates, Dwight, MD;  Location: [facility];  Service: ENT;  Laterality: Right;  ? KNEE SURGERY    ? right  ? LAPAROSCOPIC APPENDECTOMY N/A 07/25/2018  ? Procedure: APPENDECTOMY LAPAROSCOPIC;  Surgeon: Jenkins, Mark, MD;  [facility];  Service: General;  Laterality: N/A;  ? MANDIBULAR HARDWARE REMOVAL N/A 05/06/2018  ? Procedure: REMOVAL OF MMF HARDWARE;  Surgeon: Shoemaker, Kailer, MD;  Location: [facility];  Service: ENT;  Laterality: N/A;  ? NASAL SEPTUM SURGERY    ? NASAL SINUS SURGERY    ? POLYPECTOMY    ? right sfa recanalization and stenting     ? [facility] 12/2015  ? UPPER GASTROINTESTINAL ENDOSCOPY  07/26/2020  ? WRIST SURGERY    ? right and left  ? ?Current Outpatient Medications on File Prior to Visit  ?Medication Sig Dispense Refill  ? amphetamine-dextroamphetamine (ADDERALL) 30 MG tablet Take 30 mg by mouth daily.    ? aspirin EC 81 MG tablet Take 81 mg by mouth daily.    ? baclofen (LIORESAL) 10 MG tablet Take 10 mg by mouth 4 (four) times daily.    ? clonazePAM (KLONOPIN) 1 MG tablet Take 1 mg by mouth 2 (two) times daily.    ? diclofenac Sodium (VOLTAREN) 1 % GEL Apply topically.    ? DULoxetine (CYMBALTA) 60 MG capsule Take 60 mg by mouth daily.    ? lamoTRIgine (LAMICTAL) 200 MG tablet Take 200 mg by mouth daily.    ? levothyroxine (SYNTHROID) 125 MCG tablet TAKE 1 TABLET BY MOUTH DAILY BEFORE BREAKFAST. 90 tablet 1  ? losartan (COZAAR) 50 MG tablet TAKE 1 TABLET ONCE DAILY. 30 tablet  1  ? metoprolol tartrate (LOPRESSOR) 25 MG tablet TAKE (1) TABLET TWICE DAILY. 60 tablet 1  ? morphine (MS CONTIN) 15 MG 12 hr tablet Take 15 mg by mouth 3 (three) times daily.    ? NARCAN 4 MG/0.1ML L

## 2022-03-31 LAB — IRON: Iron: 87 ug/dL (ref 50–180)

## 2022-03-31 LAB — FERRITIN: Ferritin: 142 ng/mL (ref 38–380)

## 2022-03-31 LAB — VITAMIN B12: Vitamin B-12: 455 pg/mL (ref 200–1100)

## 2022-04-04 ENCOUNTER — Ambulatory Visit: Payer: Medicare HMO | Admitting: Neurology

## 2022-04-07 DIAGNOSIS — R509 Fever, unspecified: Secondary | ICD-10-CM | POA: Diagnosis not present

## 2022-04-07 DIAGNOSIS — D72829 Elevated white blood cell count, unspecified: Secondary | ICD-10-CM | POA: Diagnosis not present

## 2022-04-07 DIAGNOSIS — D649 Anemia, unspecified: Secondary | ICD-10-CM | POA: Diagnosis not present

## 2022-04-07 DIAGNOSIS — G44201 Tension-type headache, unspecified, intractable: Secondary | ICD-10-CM | POA: Diagnosis not present

## 2022-04-07 DIAGNOSIS — R059 Cough, unspecified: Secondary | ICD-10-CM | POA: Diagnosis not present

## 2022-04-07 DIAGNOSIS — U071 COVID-19: Secondary | ICD-10-CM | POA: Diagnosis not present

## 2022-04-07 DIAGNOSIS — J189 Pneumonia, unspecified organism: Secondary | ICD-10-CM | POA: Diagnosis not present

## 2022-04-14 ENCOUNTER — Ambulatory Visit (INDEPENDENT_AMBULATORY_CARE_PROVIDER_SITE_OTHER): Payer: Medicare HMO | Admitting: Family Medicine

## 2022-04-14 VITALS — BP 162/102 | HR 70 | Temp 98.4°F | Ht 73.0 in | Wt 248.2 lb

## 2022-04-14 DIAGNOSIS — J189 Pneumonia, unspecified organism: Secondary | ICD-10-CM

## 2022-04-14 LAB — CBC WITH DIFFERENTIAL/PLATELET
Absolute Monocytes: 663 cells/uL (ref 200–950)
Basophils Absolute: 60 cells/uL (ref 0–200)
Basophils Relative: 0.7 %
Eosinophils Absolute: 0 cells/uL — ABNORMAL LOW (ref 15–500)
Eosinophils Relative: 0 %
HCT: 36.6 % — ABNORMAL LOW (ref 38.5–50.0)
Hemoglobin: 12 g/dL — ABNORMAL LOW (ref 13.2–17.1)
Lymphs Abs: 3366 cells/uL (ref 850–3900)
MCH: 29.1 pg (ref 27.0–33.0)
MCHC: 32.8 g/dL (ref 32.0–36.0)
MCV: 88.6 fL (ref 80.0–100.0)
MPV: 10.1 fL (ref 7.5–12.5)
Monocytes Relative: 7.8 %
Neutro Abs: 4412 cells/uL (ref 1500–7800)
Neutrophils Relative %: 51.9 %
Platelets: 203 10*3/uL (ref 140–400)
RBC: 4.13 10*6/uL — ABNORMAL LOW (ref 4.20–5.80)
RDW: 14.1 % (ref 11.0–15.0)
Total Lymphocyte: 39.6 %
WBC: 8.5 10*3/uL (ref 3.8–10.8)

## 2022-04-14 NOTE — Progress Notes (Signed)
Subjective:    Patient ID: Tony Campos, male    DOB: Dec 26, 1965, 56 y.o.   MRN: [id number] Patient went to the emergency room recently.  His COVID test was again positive although he recently had COVID and completed course of antiviral therapy for that.  A chest x-ray showed a possible right lower lobe opacity and so they treated him also with Levaquin for pneumonia.  Oxygen was 90%, but today 97% on room air.  Patient states that he did not have a cough.  The reason he went to the emergency room as he had a fever over 103 and a bad headache.  Since starting antibiotics, the fever is gone away.  He saw a temperature near 100 but has been afebrile for the last few days.  He still has not had a cough.  He is feeling better.  He does have a swollen tender right submandibular gland.  However otherwise his lungs are completely clear to auscultation Past Medical History:  Diagnosis Date   AAA (abdominal aortic aneurysm) without rupture (HCC)    3.4 cm found on mri 2/20   Acid reflux    Anemia    Anxiety    Aortic atherosclerosis (HCC)    Bipolar disorder (HCC)    Depression    Diverticulitis    Dyspnea    exertional   Genital warts    Hyperlipidemia    Hypertension    Hypothyroidism    Meningitis    PAD (peripheral artery disease) ([place])    femoral artery angioplasy/stent   SIADH (syndrome of inappropriate ADH production) ([place])    Sleep apnea 12/2015   Smoker    Smoker    Spondylosis    Testosterone deficiency    Past Surgical History:  Procedure Laterality Date   APPENDECTOMY  12/2018   at [place]  2007   CLOSED REDUCTION MANDIBLE N/A 03/09/2018   Procedure: CLOSED REDUCTION MANDIBULAR MMF;  Surgeon: Jerrell Belfast, MD;  Location: [place];  Service: ENT;  Laterality: N/A;   COLONOSCOPY  07/26/2020   10/22/2018   DRUG INDUCED ENDOSCOPY N/A 04/06/2021   Procedure: DRUG INDUCED ENDOSCOPY;  Surgeon: Melida Quitter, MD;  Location: [place];   Service: ENT;  Laterality: N/A;   IMPLANTATION OF HYPOGLOSSAL NERVE STIMULATOR Right 07/20/2021   Procedure: IMPLANTATION OF HYPOGLOSSAL NERVE STIMULATOR;  Surgeon: Melida Quitter, MD;  Location: [place];  Service: ENT;  Laterality: Right;   KNEE SURGERY     right   LAPAROSCOPIC APPENDECTOMY N/A 07/25/2018   Procedure: APPENDECTOMY LAPAROSCOPIC;  Surgeon: Aviva Signs, MD;  Location: [facility];  Service: General;  Laterality: N/A;   MANDIBULAR HARDWARE REMOVAL N/A 05/06/2018   Procedure: REMOVAL OF MMF HARDWARE;  Surgeon: Jerrell Belfast, MD;  Location: [place];  Service: ENT;  Laterality: N/A;   NASAL SEPTUM SURGERY     NASAL SINUS SURGERY     POLYPECTOMY     right sfa recanalization and stenting      [facility] 12/2015   UPPER GASTROINTESTINAL ENDOSCOPY  07/26/2020   WRIST SURGERY     right and left   Current Outpatient Medications on File Prior to Visit  Medication Sig Dispense Refill   amphetamine-dextroamphetamine (ADDERALL) 30 MG tablet Take 30 mg by mouth daily.     aspirin EC 81 MG tablet Take 81 mg by mouth daily.     baclofen (LIORESAL) 10 MG tablet Take 10 mg by mouth 4 (four)  times daily.     clonazePAM (KLONOPIN) 1 MG tablet Take 1 mg by mouth 2 (two) times daily.     diclofenac Sodium (VOLTAREN) 1 % GEL Apply topically.     DULoxetine (CYMBALTA) 60 MG capsule Take 60 mg by mouth daily.     lamoTRIgine (LAMICTAL) 200 MG tablet Take 200 mg by mouth daily.     levothyroxine (SYNTHROID) 125 MCG tablet TAKE 1 TABLET BY MOUTH DAILY BEFORE BREAKFAST. 90 tablet 1   metoprolol tartrate (LOPRESSOR) 25 MG tablet TAKE (1) TABLET TWICE DAILY. 60 tablet 1   morphine (MS CONTIN) 15 MG 12 hr tablet Take 15 mg by mouth 3 (three) times daily.     NARCAN 4 MG/0.1ML LIQD nasal spray kit SMARTSIG:1 Spray(s) Both Nares Once PRN     pantoprazole (PROTONIX) 40 MG tablet TAKE (1) TABLET TWICE DAILY. 180 tablet 3   pregabalin (LYRICA) 150 MG capsule Take 150 mg by mouth  2 (two) times daily.     REXULTI 1 MG TABS tablet Take 1 mg by mouth daily.     rosuvastatin (CRESTOR) 40 MG tablet TAKE 1 TABLET DAILY. 30 tablet 1   tamsulosin (FLOMAX) 0.4 MG CAPS capsule TAKE 1 CAPSULE DAILY. 30 capsule 1   valACYclovir (VALTREX) 500 MG tablet TAKE 1 TABLET BY MOUTH ONCE DAILY. 30 tablet 1   vitamin E 1000 UNIT capsule Take 1,000 Units by mouth daily.     [DISCONTINUED] esomeprazole (NEXIUM) 40 MG capsule Take 40 mg by mouth daily before breakfast.     No current facility-administered medications on file prior to visit.    No Known Allergies Social History   Socioeconomic History   Marital status: Married    Spouse name: Not on file   Number of children: 0   Years of education: Not on file   Highest education level: Not on file  Occupational History   Not on file  Tobacco Use   Smoking status: Some Days    Packs/day: 0.25    Types: Cigarettes   Smokeless tobacco: Never  Vaping Use   Vaping Use: Every day  Substance and Sexual Activity   Alcohol use: No   Drug use: No   Sexual activity: Not Currently  Other Topics Concern   Not on file  Social History Narrative   Not on file   Social Determinants of Health   Financial Resource Strain: Not on file  Food Insecurity: Not on file  Transportation Needs: Not on file  Physical Activity: Not on file  Stress: Not on file  Social Connections: Not on file  Intimate Partner Violence: Not on file      Review of Systems  All other systems reviewed and are negative.     Objective:   Physical Exam Vitals reviewed.  Constitutional:      General: He is not in acute distress.    Appearance: Normal appearance. He is not ill-appearing, toxic-appearing or diaphoretic.  HENT:     Head: Normocephalic and atraumatic.  Cardiovascular:     Rate and Rhythm: Normal rate and regular rhythm.     Heart sounds: Normal heart sounds. No murmur heard.   No friction rub. No gallop.  Pulmonary:     Effort: Pulmonary  effort is normal.     Breath sounds: No wheezing or rhonchi.  Musculoskeletal:     Right lower leg: No edema.     Left lower leg: No edema.  Neurological:     General: No focal  deficit present.     Mental Status: He is alert and oriented to person, place, and time.        Assessment & Plan:  Pneumonia of right lower lobe due to infectious organism - Plan: CBC with Differential/Platelet I want patient to resume losartan because his blood pressure is high.  Finish Levaquin.  Recheck CBC today.  If white blood cell count is trending down (it was 12.6 at the hospital), complete the Levaquin and he should be fine.  I believe that the swollen submandibular gland could be a reaction to his recent infection.  We will monitor this area and anticipated and will gradually strength over time.  If persistent we could reevaluate with a biopsy or imaging.

## 2022-04-18 ENCOUNTER — Other Ambulatory Visit: Payer: Self-pay | Admitting: Family Medicine

## 2022-04-18 DIAGNOSIS — I714 Abdominal aortic aneurysm, without rupture, unspecified: Secondary | ICD-10-CM

## 2022-04-18 MED ORDER — TRIAMCINOLONE ACETONIDE 0.1 % EX CREA: 1.0000 "application " | TOPICAL_CREAM | Freq: Two times a day (BID) | CUTANEOUS | 0 refills | Status: DC

## 2022-04-20 DIAGNOSIS — G4730 Sleep apnea, unspecified: Secondary | ICD-10-CM | POA: Diagnosis not present

## 2022-04-20 DIAGNOSIS — G47419 Narcolepsy without cataplexy: Secondary | ICD-10-CM | POA: Diagnosis not present

## 2022-04-20 DIAGNOSIS — F4312 Post-traumatic stress disorder, chronic: Secondary | ICD-10-CM | POA: Diagnosis not present

## 2022-04-20 DIAGNOSIS — R69 Illness, unspecified: Secondary | ICD-10-CM | POA: Diagnosis not present

## 2022-04-20 DIAGNOSIS — F3181 Bipolar II disorder: Secondary | ICD-10-CM | POA: Diagnosis not present

## 2022-05-04 ENCOUNTER — Ambulatory Visit (HOSPITAL_COMMUNITY)
Admission: RE | Admit: 2022-05-04 | Discharge: 2022-05-04 | Disposition: A | Discharge status: Home / Self Care | Payer: Medicare HMO | Source: Ambulatory Visit | Attending: Family Medicine | Admitting: Family Medicine

## 2022-05-04 DIAGNOSIS — I714 Abdominal aortic aneurysm, without rupture, unspecified: Secondary | ICD-10-CM | POA: Diagnosis not present

## 2022-05-16 DIAGNOSIS — M47816 Spondylosis without myelopathy or radiculopathy, lumbar region: Secondary | ICD-10-CM | POA: Diagnosis not present

## 2022-05-16 DIAGNOSIS — M47812 Spondylosis without myelopathy or radiculopathy, cervical region: Secondary | ICD-10-CM | POA: Diagnosis not present

## 2022-05-16 DIAGNOSIS — G894 Chronic pain syndrome: Secondary | ICD-10-CM | POA: Diagnosis not present

## 2022-05-16 DIAGNOSIS — M62830 Muscle spasm of back: Secondary | ICD-10-CM | POA: Diagnosis not present

## 2022-05-22 ENCOUNTER — Other Ambulatory Visit: Payer: Self-pay | Admitting: Family Medicine

## 2022-05-22 DIAGNOSIS — R000 Tachycardia, unspecified: Secondary | ICD-10-CM

## 2022-06-13 DIAGNOSIS — G894 Chronic pain syndrome: Secondary | ICD-10-CM | POA: Diagnosis not present

## 2022-06-13 DIAGNOSIS — M47816 Spondylosis without myelopathy or radiculopathy, lumbar region: Secondary | ICD-10-CM | POA: Diagnosis not present

## 2022-06-13 DIAGNOSIS — M47812 Spondylosis without myelopathy or radiculopathy, cervical region: Secondary | ICD-10-CM | POA: Diagnosis not present

## 2022-06-13 DIAGNOSIS — M62830 Muscle spasm of back: Secondary | ICD-10-CM | POA: Diagnosis not present

## 2022-06-26 ENCOUNTER — Other Ambulatory Visit: Payer: Self-pay | Admitting: Family Medicine

## 2022-06-26 DIAGNOSIS — R000 Tachycardia, unspecified: Secondary | ICD-10-CM

## 2022-06-27 NOTE — Telephone Encounter (Signed)
Requested Prescriptions  Pending Prescriptions Disp Refills  . pantoprazole (PROTONIX) 40 MG tablet [Pharmacy Med Name: PANTOPRAZOLE SOD DR 40 MG TAB] 180 tablet 0    Sig: TAKE (1) TABLET TWICE DAILY.     Gastroenterology: Proton Pump Inhibitors Passed - 06/26/2022 10:16 AM      Passed - Valid encounter within last 12 months    Recent Outpatient Visits          2 months ago Pneumonia of right lower lobe due to infectious organism   [place] Pickard, Cammie Mcgee, MD   2 months ago Anemia, unspecified type   [place] Susy Frizzle, MD   3 months ago Syncope, unspecified syncope type   [place] Dennard Schaumann, Cammie Mcgee, MD   10 months ago COPD exacerbation [facility])   [facility] Susy Frizzle, MD   10 months ago SOB (shortness of breath)   [place] Dennard Schaumann, Cammie Mcgee, MD             . valACYclovir (VALTREX) 500 MG tablet [Pharmacy Med Name: VALACYCLOVIR HCL 500 MG TABLET] 90 tablet 0    Sig: TAKE 1 TABLET BY MOUTH ONCE DAILY.     Antimicrobials:  Antiviral Agents - Anti-Herpetic Passed - 06/26/2022 10:16 AM      Passed - Valid encounter within last 12 months    Recent Outpatient Visits          2 months ago Pneumonia of right lower lobe due to infectious organism   [place] Pickard, Cammie Mcgee, MD   2 months ago Anemia, unspecified type   [place] Susy Frizzle, MD   3 months ago Syncope, unspecified syncope type   [place] Dennard Schaumann Cammie Mcgee, MD   10 months ago COPD exacerbation [facility])   [place] Susy Frizzle, MD   10 months ago SOB (shortness of breath)   [place] Susy Frizzle, MD             . metoprolol tartrate (LOPRESSOR) 25 MG tablet [Pharmacy Med Name: METOPROLOL TARTRATE 25 MG TAB] 180 tablet 0    Sig: TAKE (1) TABLET TWICE DAILY.     Cardiovascular:  Beta  Blockers Failed - 06/26/2022 10:16 AM      Failed - Last BP in normal range    BP Readings from Last 1 Encounters:  04/14/22 (!) 162/102         Passed - Last Heart Rate in normal range    Pulse Readings from Last 1 Encounters:  04/14/22 70         Passed - Valid encounter within last 6 months    Recent Outpatient Visits          2 months ago Pneumonia of right lower lobe due to infectious organism   [place] Pickard, Cammie Mcgee, MD   2 months ago Anemia, unspecified type   [place] Susy Frizzle, MD   3 months ago Syncope, unspecified syncope type   [place] Susy Frizzle, MD   10 months ago COPD exacerbation [facility])   [place] Susy Frizzle, MD   10 months ago SOB (shortness of breath)   [facility] Susy Frizzle, MD             . rosuvastatin (CRESTOR) 40  MG tablet [Pharmacy Med Name: ROSUVASTATIN CALCIUM 40 MG TAB] 90 tablet 0    Sig: TAKE 1 TABLET DAILY.     Cardiovascular:  Antilipid - Statins 2 Failed - 06/26/2022 10:16 AM      Failed - Lipid Panel in normal range within the last 12 months    Cholesterol  Date Value Ref Range Status  03/31/2021 123 <200 mg/dL Final   LDL Cholesterol (Calc)  Date Value Ref Range Status  03/31/2021 62 mg/dL (calc) Final    Comment:    Reference range: <100 . Desirable range <100 mg/dL for primary prevention;   <70 mg/dL for patients with CHD or diabetic patients  with > or = 2 CHD risk factors. [place] Kitchen LDL-C is now calculated using the Martin-Hopkins  calculation, which is a validated novel method providing  better accuracy than the Friedewald equation in the  estimation of LDL-C.  Cresenciano Genre et al. Annamaria Helling. 5643;329(51): 2061-2068  (http://education.[employer].com/faq/FAQ164)    HDL  Date Value Ref Range Status  03/31/2021 34 (L) > OR = 40 mg/dL Final   Triglycerides  Date Value Ref Range Status  03/31/2021 197  (H) <150 mg/dL Final         Passed - Cr in normal range and within 360 days    Creat  Date Value Ref Range Status  03/24/2022 1.26 0.70 - 1.30 mg/dL Final         Passed - Patient is not pregnant      Passed - Valid encounter within last 12 months    Recent Outpatient Visits          2 months ago Pneumonia of right lower lobe due to infectious organism   [place] Pickard, Cammie Mcgee, MD   2 months ago Anemia, unspecified type   [place] Susy Frizzle, MD   3 months ago Syncope, unspecified syncope type   [place] Pickard, Cammie Mcgee, MD   10 months ago COPD exacerbation [facility])   [place] Susy Frizzle, MD   10 months ago SOB (shortness of breath)   [place] Pickard, Cammie Mcgee, MD             . tamsulosin (FLOMAX) 0.4 MG CAPS capsule [Pharmacy Med Name: TAMSULOSIN HCL 0.4 MG CAPSULE] 90 capsule 0    Sig: TAKE 1 CAPSULE DAILY.     Urology: Alpha-Adrenergic Blocker Failed - 06/26/2022 10:16 AM      Failed - PSA in normal range and within 360 days    PSA  Date Value Ref Range Status  03/31/2021 0.70 < OR = 4.00 ng/mL Final    Comment:    The total PSA value from this assay system is  standardized against the WHO standard. The test  result will be approximately 20% lower when compared  to the equimolar-standardized total PSA (Beckman  Coulter). Comparison of serial PSA results should be  interpreted with this fact in mind. . This test was performed using the Siemens  chemiluminescent method. Values obtained from  different assay methods cannot be used interchangeably. PSA levels, regardless of value, should not be interpreted as absolute evidence of the presence or absence of disease.          Failed - Last BP in normal range    BP Readings from Last 1 Encounters:  04/14/22 (!) 162/102         Passed - Valid encounter within last 12  months    Recent  Outpatient Visits          2 months ago Pneumonia of right lower lobe due to infectious organism   [place] Pickard, Cammie Mcgee, MD   2 months ago Anemia, unspecified type   [place] Dennard Schaumann Cammie Mcgee, MD   3 months ago Syncope, unspecified syncope type   [place] Dennard Schaumann, Cammie Mcgee, MD   10 months ago COPD exacerbation [facility])   [place] [facility] Susy Frizzle, MD   10 months ago SOB (shortness of breath)   [place] Pickard, Cammie Mcgee, MD

## 2022-07-11 DIAGNOSIS — M47812 Spondylosis without myelopathy or radiculopathy, cervical region: Secondary | ICD-10-CM | POA: Diagnosis not present

## 2022-07-11 DIAGNOSIS — M62830 Muscle spasm of back: Secondary | ICD-10-CM | POA: Diagnosis not present

## 2022-07-11 DIAGNOSIS — G894 Chronic pain syndrome: Secondary | ICD-10-CM | POA: Diagnosis not present

## 2022-07-11 DIAGNOSIS — M47816 Spondylosis without myelopathy or radiculopathy, lumbar region: Secondary | ICD-10-CM | POA: Diagnosis not present

## 2022-07-13 DIAGNOSIS — G4733 Obstructive sleep apnea (adult) (pediatric): Secondary | ICD-10-CM | POA: Diagnosis not present

## 2022-07-17 ENCOUNTER — Telehealth: Payer: Self-pay | Admitting: Neurology

## 2022-07-17 ENCOUNTER — Encounter: Payer: Self-pay | Admitting: Neurology

## 2022-07-17 NOTE — Telephone Encounter (Signed)
Received the ONO on inspire.  Dr. Brett Fairy reviewed this report and states that the patient had a recording of 5 hours and 29 minutes.  At that time 3 hours 27 minutes and 38 seconds was spent below 88%.  Based off of this report patient would qualify for oxygen.  Unfortunately since we are unable to treat him with CPAP/BiPAP, this means he will have to have a referral to pulmonology to manage oxygen being established.  I will place a referral and contact the patient to make him aware.

## 2022-07-20 ENCOUNTER — Other Ambulatory Visit: Payer: Self-pay | Admitting: Neurology

## 2022-07-20 DIAGNOSIS — G4710 Hypersomnia, unspecified: Secondary | ICD-10-CM

## 2022-07-20 DIAGNOSIS — Z789 Other specified health status: Secondary | ICD-10-CM

## 2022-07-20 DIAGNOSIS — G4734 Idiopathic sleep related nonobstructive alveolar hypoventilation: Secondary | ICD-10-CM

## 2022-07-20 DIAGNOSIS — G4733 Obstructive sleep apnea (adult) (pediatric): Secondary | ICD-10-CM

## 2022-07-20 DIAGNOSIS — G4719 Other hypersomnia: Secondary | ICD-10-CM

## 2022-07-20 DIAGNOSIS — Z9682 Presence of neurostimulator: Secondary | ICD-10-CM

## 2022-07-25 DIAGNOSIS — D485 Neoplasm of uncertain behavior of skin: Secondary | ICD-10-CM | POA: Diagnosis not present

## 2022-07-25 DIAGNOSIS — D225 Melanocytic nevi of trunk: Secondary | ICD-10-CM | POA: Diagnosis not present

## 2022-08-01 ENCOUNTER — Other Ambulatory Visit: Payer: Self-pay | Admitting: Family Medicine

## 2022-08-01 DIAGNOSIS — R000 Tachycardia, unspecified: Secondary | ICD-10-CM

## 2022-08-04 ENCOUNTER — Encounter: Payer: Self-pay | Admitting: Gastroenterology

## 2022-08-06 ENCOUNTER — Other Ambulatory Visit: Payer: Self-pay | Admitting: Neurology

## 2022-08-06 DIAGNOSIS — Z789 Other specified health status: Secondary | ICD-10-CM

## 2022-08-06 DIAGNOSIS — G4734 Idiopathic sleep related nonobstructive alveolar hypoventilation: Secondary | ICD-10-CM

## 2022-08-06 DIAGNOSIS — G4719 Other hypersomnia: Secondary | ICD-10-CM

## 2022-08-06 DIAGNOSIS — G4733 Obstructive sleep apnea (adult) (pediatric): Secondary | ICD-10-CM

## 2022-08-06 DIAGNOSIS — G4710 Hypersomnia, unspecified: Secondary | ICD-10-CM

## 2022-08-06 DIAGNOSIS — G4739 Other sleep apnea: Secondary | ICD-10-CM

## 2022-08-09 DIAGNOSIS — M47816 Spondylosis without myelopathy or radiculopathy, lumbar region: Secondary | ICD-10-CM | POA: Diagnosis not present

## 2022-08-09 DIAGNOSIS — M47812 Spondylosis without myelopathy or radiculopathy, cervical region: Secondary | ICD-10-CM | POA: Diagnosis not present

## 2022-08-09 DIAGNOSIS — G894 Chronic pain syndrome: Secondary | ICD-10-CM | POA: Diagnosis not present

## 2022-08-09 DIAGNOSIS — Z79891 Long term (current) use of opiate analgesic: Secondary | ICD-10-CM | POA: Diagnosis not present

## 2022-08-09 DIAGNOSIS — M62830 Muscle spasm of back: Secondary | ICD-10-CM | POA: Diagnosis not present
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
## 2022-08-11 ENCOUNTER — Ambulatory Visit: Payer: Medicare HMO | Admitting: Internal Medicine

## 2022-08-11 ENCOUNTER — Encounter: Payer: Self-pay | Admitting: Internal Medicine

## 2022-08-11 VITALS — BP 136/84 | HR 69 | Temp 98.1°F | Ht 72.0 in | Wt 257.6 lb

## 2022-08-11 DIAGNOSIS — F17200 Nicotine dependence, unspecified, uncomplicated: Secondary | ICD-10-CM

## 2022-08-11 DIAGNOSIS — Z23 Encounter for immunization: Secondary | ICD-10-CM | POA: Diagnosis not present

## 2022-08-11 DIAGNOSIS — G4733 Obstructive sleep apnea (adult) (pediatric): Secondary | ICD-10-CM

## 2022-08-11 DIAGNOSIS — R0609 Other forms of dyspnea: Secondary | ICD-10-CM

## 2022-08-11 DIAGNOSIS — R69 Illness, unspecified: Secondary | ICD-10-CM | POA: Diagnosis not present

## 2022-08-11 DIAGNOSIS — G4734 Idiopathic sleep related nonobstructive alveolar hypoventilation: Secondary | ICD-10-CM

## 2022-08-11 NOTE — Patient Instructions (Addendum)
Order- O2 qualifying walk test on room air     dx Dyspnea on exertion  Order- schedule PFT     dx dyspnea on exertion  Order- Prevnar 20 pneumococcal vaccine  Order- flu vax- standard  Order- DME [employer]-   Home O2 2L for sleep    (ONOX 07/13/22)      dx Nocturnal Hypoxemia

## 2022-08-11 NOTE — Assessment & Plan Note (Signed)
He described being satisfied with his Inspire experience.  No changes anticipated.  Managed by Neurology.

## 2022-08-11 NOTE — Assessment & Plan Note (Signed)
Actively smoking.  We are starting now to encourage more effort at smoking cessation.  Can involve [place] Quit line

## 2022-08-11 NOTE — Assessment & Plan Note (Signed)
This likely reflects some degree of COPD, possible obesity hypoventilation and uncertain residual OSA.  We discussed oxygen therapy. Plan-walk test done today does not show exercise desaturation.  We can start home oxygen for sleep at 2 L then reassess.  Schedule PFT.  Press smoking cessation.

## 2022-08-16 ENCOUNTER — Other Ambulatory Visit: Payer: Self-pay | Admitting: Physical Medicine and Rehabilitation

## 2022-08-16 ENCOUNTER — Other Ambulatory Visit (HOSPITAL_COMMUNITY): Payer: Self-pay | Admitting: Physical Medicine and Rehabilitation

## 2022-08-16 DIAGNOSIS — M5416 Radiculopathy, lumbar region: Secondary | ICD-10-CM

## 2022-08-28 ENCOUNTER — Ambulatory Visit: Payer: Medicare HMO | Admitting: Pharmacist

## 2022-08-28 DIAGNOSIS — G4719 Other hypersomnia: Secondary | ICD-10-CM

## 2022-08-28 DIAGNOSIS — F32A Depression, unspecified: Secondary | ICD-10-CM
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
## 2022-08-29 ENCOUNTER — Telehealth: Payer: Self-pay | Admitting: Neurology

## 2022-08-29 NOTE — Telephone Encounter (Signed)
Noted, thank you

## 2022-08-29 NOTE — Telephone Encounter (Signed)
It looks like Dr. Annamaria Boots has order the patient on 2 L O2 and PFT is scheduled for 11/13/22. Dr. Brett Fairy are you okay with holding off scheduling his sleep study?

## 2022-08-29 NOTE — Patient Instructions (Addendum)
Visit Information   Goals Addressed             This Visit's Progress    Track and Manage My Blood Pressure-Hypertension   On track    Timeframe:  Long-Range Goal Priority:  High Start Date:   02/23/22                          Expected End Date: 08/26/22                      Follow Up Date 05/26/22    - check blood pressure weekly - choose a place to take my blood pressure (home, clinic or office, retail store)    Why is this important?   You won't feel high blood pressure, but it can still hurt your blood vessels.  High blood pressure can cause heart or kidney problems. It can also cause a stroke.  Making lifestyle changes like losing a little weight or eating less salt will help.  Checking your blood pressure at home and at different times of the day can help to control blood pressure.  If the doctor prescribes medicine remember to take it the way the doctor ordered.  Call the office if you cannot afford the medicine or if there are questions about it.     Notes:        Patient Care Plan: General Pharmacy (Adult)     Problem Identified: HTN, Anxiety, Depression, HLD, Sleep apnea   Priority: High  Onset Date: 02/23/2022     Long-Range Goal: Patient-Specific Goal   Start Date: 02/23/2022  Expected End Date: 08/26/2022  Recent Progress: On track  Priority: High  Note:   Current Barriers:  Back pain, possible upcoming surgerty  Pharmacist Clinical Goal(s):  Patient will achieve improvement in drowsiness as evidenced by ability to do things during the day through collaboration with PharmD and provider.   Interventions: 1:1 collaboration with Susy Frizzle, MD regarding development and update of comprehensive plan of care as evidenced by provider attestation and co-signature Inter-disciplinary care team collaboration (see longitudinal plan of care) Comprehensive medication review performed; medication list updated in electronic medical record  Hypertension (BP  goal <130/80) -Controlled -Current treatment: Losartan '50mg'$  daily Appropriate, Effective, Safe, Accessible Metoprolol tartrate '25mg'$  twice daily Appropriate, Effective, Safe, Accessible -Medications previously tried: none noted  -Current home readings: 120s-130s/70s-80s -Current dietary habits: not much of an appetite at home - either eats frozen meals or eats out.  Eats home cooked meals when he goes to his parents house -Current exercise habits: minimal due to pain and sleepiness -Denies hypotensive/hypertensive symptoms -Educated on BP goals and benefits of medications for prevention of heart attack, stroke and kidney damage; Exercise goal of 150 minutes per week; Symptoms of hypotension and importance of maintaining adequate hydration; -Counseled to monitor BP at home as able, document, and provide log at future appointments -Recommended to continue current medication No changes at this time  Hyperlipidemia: (LDL goal < 100) -Controlled, not assessed -Current treatment: Rosuvastatin '40mg'$  daily Appropriate, Effective, Safe, Accessible -Medications previously tried: none noted  -Current dietary patterns: see HTN -Current exercise habits: minimal -Educated on Cholesterol goals;  Benefits of statin for ASCVD risk reduction; Importance of limiting foods high in cholesterol; -Recommended to continue current medication LDL well controlled, no adverse effects with medication  Depression/Anxiety (Goal: Reduce symptoms) 08/28/22 -Controlled, based on improved PHQ-9 -Current treatment: Rexulti '1mg'$  daily Appropriate, Effective, Safe,  Accessible Duloxetine '60mg'$  Appropriate, Effective, Safe, Accessible -Medications previously tried/failed: none noted -PHQ9:     04/14/2022    9:07 AM 03/31/2021    9:28 AM 06/10/2018    4:24 PM  PHQ9 SCORE ONLY  PHQ-9 Total Score '6 21 2   '$ -GAD7:      View : No data to display.        -Still follows with Dr. Dorethea Clan regularly.  He reports his  motivation and depression symptoms have improved some, but not completely.  He has good days and bad days.  He is possibly facing an upcoming back surgery to help with his pain.  Pain is the only thing limiting him right now to do the things he wants to do. Stays active when possible.  He now takes Nuvigil on days that he needs to accomplish something and this works better than Adderall. No changes at this time continue to follow.  Hypothyroidism (Goal: Maintain TSH) -Controlled -Current treatment  Levothyroxine 144mg daily Appropriate, Effective, Safe, Accessible -Medications previously tried: none noted -Takes appropriately in the AM on empty stomach 30-60 minutes before a meal or other meds -TSH is WNL  -Recommended to continue current medication No changes at this time, recheck TSH at next routine physical  Back Pain (Goal: Control pain) 08/28/22 -Not ideally controlled, possible back surgery pending -Current treatment  Morphine ER '15mg'$  three times per day Appropriate, Effective, Safe, Accessible Lyrica '150mg'$  twice daily Appropriate, Effective, Safe, Accessible Baclofen '10mg'$  twice daily Appropriate, Effective, Safe, Accessible -Medications previously tried: methocarbamol, fentanyl -Managed by pain management -Still has some daytime sleepiness associated with pain medications.  He now takes Nuvigil which helps. He is having an MRI soon and will possible follow with back surgery.  He is happy with current medication regimen for pain and it controls pain the best of anything so far. No changes at this time, pending MRI.  Patient Goals/Self-Care Activities Patient will:  - focus on medication adherence by pill counts target a minimum of 150 minutes of moderate intensity exercise weekly  Follow Up Plan: The care management team will reach out to the patient again over the next 180 days.          The patient verbalized understanding of instructions, educational materials, and care  plan provided today and DECLINED offer to receive copy of patient instructions, educational materials, and care plan.  Telephone follow up appointment with pharmacy team member scheduled for: 1 year  CEdythe Clarity [place] PharmD, CLynchburgClinical Pharmacist Practitioner [phone]

## 2022-08-29 NOTE — Addendum Note (Signed)
Addended by: Darleen Crocker on: 08/29/2022 11:47 AM   Modules accepted: Orders

## 2022-09-01 ENCOUNTER — Other Ambulatory Visit: Payer: Self-pay | Admitting: Family Medicine

## 2022-09-01 NOTE — Telephone Encounter (Signed)
Requested Prescriptions  Pending Prescriptions Disp Refills  . losartan (COZAAR) 50 MG tablet [Pharmacy Med Name: LOSARTAN POTASSIUM 50 MG TAB] 90 tablet 0    Sig: TAKE 1 TABLET ONCE DAILY.     Cardiovascular:  Angiotensin Receptor Blockers Passed - 09/01/2022  8:09 AM      Passed - Cr in normal range and within 180 days    Creat  Date Value Ref Range Status  03/24/2022 1.26 0.70 - 1.30 mg/dL Final         Passed - K in normal range and within 180 days    Potassium  Date Value Ref Range Status  03/24/2022 4.8 3.5 - 5.3 mmol/L Final         Passed - Patient is not pregnant      Passed - Last BP in normal range    BP Readings from Last 1 Encounters:  08/11/22 136/84         Passed - Valid encounter within last 6 months    Recent Outpatient Visits          4 months ago Pneumonia of right lower lobe due to infectious organism   [place] Pickard, Cammie Mcgee, MD   5 months ago Anemia, unspecified type   [place] Susy Frizzle, MD   5 months ago Syncope, unspecified syncope type   [place] Pickard, Cammie Mcgee, MD   1 year ago COPD exacerbation [facility])   [place] Susy Frizzle, MD   1 year ago SOB (shortness of breath)   [place], Cammie Mcgee, MD             Patient will need to schedule an appointment for further refills.

## 2022-09-04 ENCOUNTER — Ambulatory Visit (HOSPITAL_COMMUNITY)
Admission: RE | Admit: 2022-09-04 | Discharge: 2022-09-04 | Disposition: A | Discharge status: Home / Self Care | Payer: Medicare HMO | Source: Ambulatory Visit | Attending: Physical Medicine and Rehabilitation | Admitting: Physical Medicine and Rehabilitation

## 2022-09-04 ENCOUNTER — Encounter (HOSPITAL_COMMUNITY): Payer: Self-pay

## 2022-09-04 DIAGNOSIS — M5416 Radiculopathy, lumbar region: Secondary | ICD-10-CM

## 2022-09-05 ENCOUNTER — Encounter: Payer: Self-pay | Admitting: Internal Medicine

## 2022-09-05 ENCOUNTER — Ambulatory Visit (HOSPITAL_COMMUNITY)
Admission: RE | Admit: 2022-09-05 | Discharge: 2022-09-05 | Disposition: A | Discharge status: Home / Self Care | Payer: Medicare HMO | Source: Ambulatory Visit | Attending: Physical Medicine and Rehabilitation | Admitting: Physical Medicine and Rehabilitation

## 2022-09-05 ENCOUNTER — Encounter: Payer: Self-pay | Admitting: Neurology

## 2022-09-05 DIAGNOSIS — M5416 Radiculopathy, lumbar region: Secondary | ICD-10-CM | POA: Diagnosis not present

## 2022-09-05 DIAGNOSIS — G4733 Obstructive sleep apnea (adult) (pediatric): Secondary | ICD-10-CM | POA: Diagnosis not present

## 2022-09-05 DIAGNOSIS — M5136 Other intervertebral disc degeneration, lumbar region: Secondary | ICD-10-CM | POA: Diagnosis not present

## 2022-09-05 DIAGNOSIS — M5126 Other intervertebral disc displacement, lumbar region: Secondary | ICD-10-CM | POA: Diagnosis not present

## 2022-09-05 MED ORDER — GADOBUTROL 1 MMOL/ML IV SOLN
10.0000 mL | Freq: Once | INTRAVENOUS | Status: AC | PRN
  Administered 2022-09-05: 10 mL via INTRAVENOUS

## 2022-09-06 DIAGNOSIS — M47812 Spondylosis without myelopathy or radiculopathy, cervical region: Secondary | ICD-10-CM | POA: Diagnosis not present

## 2022-09-06 DIAGNOSIS — M62830 Muscle spasm of back: Secondary | ICD-10-CM | POA: Diagnosis not present

## 2022-09-06 DIAGNOSIS — M47816 Spondylosis without myelopathy or radiculopathy, lumbar region: Secondary | ICD-10-CM | POA: Diagnosis not present

## 2022-09-06 DIAGNOSIS — G894 Chronic pain syndrome: Secondary | ICD-10-CM | POA: Diagnosis not present

## 2022-09-25 DIAGNOSIS — H40013 Open angle with borderline findings, low risk, bilateral: Secondary | ICD-10-CM | POA: Diagnosis not present

## 2022-09-25 DIAGNOSIS — H52223 Regular astigmatism, bilateral: Secondary | ICD-10-CM | POA: Diagnosis not present

## 2022-09-25 DIAGNOSIS — H524 Presbyopia: Secondary | ICD-10-CM | POA: Diagnosis not present

## 2022-09-25 DIAGNOSIS — H04123 Dry eye syndrome of bilateral lacrimal glands: Secondary | ICD-10-CM | POA: Diagnosis not present

## 2022-09-25 DIAGNOSIS — H5213 Myopia, bilateral: Secondary | ICD-10-CM | POA: Diagnosis not present

## 2022-09-25 DIAGNOSIS — H2513 Age-related nuclear cataract, bilateral: Secondary | ICD-10-CM | POA: Diagnosis not present

## 2022-09-25 DIAGNOSIS — H0288B Meibomian gland dysfunction left eye, upper and lower eyelids: Secondary | ICD-10-CM | POA: Diagnosis not present

## 2022-09-29 ENCOUNTER — Other Ambulatory Visit: Payer: Self-pay | Admitting: Family Medicine

## 2022-09-29 DIAGNOSIS — R000 Tachycardia, unspecified: Secondary | ICD-10-CM

## 2022-09-29 NOTE — Telephone Encounter (Signed)
Requested Prescriptions  Pending Prescriptions Disp Refills   metoprolol tartrate (LOPRESSOR) 25 MG tablet [Pharmacy Med Name: METOPROLOL TARTRATE 25 MG TAB] 180 tablet 0    Sig: TAKE (1) TABLET TWICE DAILY.     Cardiovascular:  Beta Blockers Passed - 09/29/2022 11:56 AM      Passed - Last BP in normal range    BP Readings from Last 1 Encounters:  08/11/22 136/84         Passed - Last Heart Rate in normal range    Pulse Readings from Last 1 Encounters:  08/11/22 69         Passed - Valid encounter within last 6 months    Recent Outpatient Visits           5 months ago Pneumonia of right lower lobe due to infectious organism   [place] Pickard, Cammie Mcgee, MD   6 months ago Anemia, unspecified type   [place] Susy Frizzle, MD   6 months ago Syncope, unspecified syncope type   [place] Pickard, Cammie Mcgee, MD   1 year ago COPD exacerbation [facility])   [place], Cammie Mcgee, MD   1 year ago SOB (shortness of breath)   [place] Pickard, Cammie Mcgee, MD               valACYclovir (VALTREX) 500 MG tablet [Pharmacy Med Name: VALACYCLOVIR HCL 500 MG TABLET] 90 tablet 0    Sig: TAKE 1 TABLET BY MOUTH ONCE DAILY.     Antimicrobials:  Antiviral Agents - Anti-Herpetic Passed - 09/29/2022 11:56 AM      Passed - Valid encounter within last 12 months    Recent Outpatient Visits           5 months ago Pneumonia of right lower lobe due to infectious organism   [place] Pickard, Cammie Mcgee, MD   6 months ago Anemia, unspecified type   [place] Susy Frizzle, MD   6 months ago Syncope, unspecified syncope type   [place] Pickard, Cammie Mcgee, MD   1 year ago COPD exacerbation [facility])   [place] Susy Frizzle, MD   1 year ago SOB (shortness of breath)   [place] Pickard, Cammie Mcgee, MD

## 2022-10-04 DIAGNOSIS — M4726 Other spondylosis with radiculopathy, lumbar region: Secondary | ICD-10-CM | POA: Diagnosis not present

## 2022-10-04 DIAGNOSIS — G894 Chronic pain syndrome: Secondary | ICD-10-CM | POA: Diagnosis not present

## 2022-10-04 DIAGNOSIS — M47812 Spondylosis without myelopathy or radiculopathy, cervical region: Secondary | ICD-10-CM | POA: Diagnosis not present

## 2022-10-04 DIAGNOSIS — M62830 Muscle spasm of back: Secondary | ICD-10-CM | POA: Diagnosis not present

## 2022-10-04 DIAGNOSIS — M47816 Spondylosis without myelopathy or radiculopathy, lumbar region: Secondary | ICD-10-CM | POA: Diagnosis not present

## 2022-10-06 DIAGNOSIS — G4733 Obstructive sleep apnea (adult) (pediatric): Secondary | ICD-10-CM | POA: Diagnosis not present

## 2022-10-18 DIAGNOSIS — M4726 Other spondylosis with radiculopathy, lumbar region: Secondary | ICD-10-CM | POA: Diagnosis not present

## 2022-11-01 DIAGNOSIS — M47812 Spondylosis without myelopathy or radiculopathy, cervical region: Secondary | ICD-10-CM | POA: Diagnosis not present

## 2022-11-01 DIAGNOSIS — M47816 Spondylosis without myelopathy or radiculopathy, lumbar region: Secondary | ICD-10-CM | POA: Diagnosis not present

## 2022-11-01 DIAGNOSIS — M62830 Muscle spasm of back: Secondary | ICD-10-CM | POA: Diagnosis not present

## 2022-11-01 DIAGNOSIS — G894 Chronic pain syndrome: Secondary | ICD-10-CM | POA: Diagnosis not present

## 2022-11-05 DIAGNOSIS — G4733 Obstructive sleep apnea (adult) (pediatric): Secondary | ICD-10-CM | POA: Diagnosis not present

## 2022-11-10 DIAGNOSIS — M4726 Other spondylosis with radiculopathy, lumbar region: Secondary | ICD-10-CM | POA: Diagnosis not present

## 2022-11-13 ENCOUNTER — Other Ambulatory Visit: Payer: Self-pay | Admitting: Family Medicine

## 2022-11-13 ENCOUNTER — Ambulatory Visit: Payer: Medicare HMO | Admitting: Internal Medicine

## 2022-11-13 NOTE — Telephone Encounter (Signed)
PT NEED CPE APPT W/PCP FOR FUTURE REFILLS  

## 2022-11-22 DIAGNOSIS — H52223 Regular astigmatism, bilateral: Secondary | ICD-10-CM | POA: Diagnosis not present

## 2022-11-22 DIAGNOSIS — H5213 Myopia, bilateral: Secondary | ICD-10-CM | POA: Diagnosis not present

## 2022-11-29 NOTE — Telephone Encounter (Signed)
Left message to return call 

## 2022-12-04 DIAGNOSIS — Z79891 Long term (current) use of opiate analgesic: Secondary | ICD-10-CM | POA: Diagnosis not present

## 2022-12-04 DIAGNOSIS — G894 Chronic pain syndrome: Secondary | ICD-10-CM | POA: Diagnosis not present

## 2022-12-04 DIAGNOSIS — M47812 Spondylosis without myelopathy or radiculopathy, cervical region: Secondary | ICD-10-CM | POA: Diagnosis not present

## 2022-12-04 DIAGNOSIS — M47816 Spondylosis without myelopathy or radiculopathy, lumbar region: Secondary | ICD-10-CM | POA: Diagnosis not present

## 2022-12-04 DIAGNOSIS — M62830 Muscle spasm of back: Secondary | ICD-10-CM | POA: Diagnosis not present

## 2022-12-05 ENCOUNTER — Ambulatory Visit (INDEPENDENT_AMBULATORY_CARE_PROVIDER_SITE_OTHER): Payer: Medicare HMO

## 2022-12-05 VITALS — Ht 73.0 in | Wt 257.0 lb

## 2022-12-05 DIAGNOSIS — Z0000 Encounter for general adult medical examination without abnormal findings: Secondary | ICD-10-CM

## 2022-12-05 NOTE — Patient Instructions (Signed)
Tony Campos , Thank you for taking time to come for your Medicare Wellness Visit. I appreciate your ongoing commitment to your health goals. Please review the following plan we discussed and let me know if I can assist you in the future.   These are the goals we discussed:  Goals      Increase physical activity     Track and Manage My Blood Pressure-Hypertension     Timeframe:  Long-Range Goal Priority:  High Start Date:   02/23/22                          Expected End Date: 08/26/22                      Follow Up Date 05/26/22    - check blood pressure weekly - choose a place to take my blood pressure (home, clinic or office, retail store)    Why is this important?   You won't feel high blood pressure, but it can still hurt your blood vessels.  High blood pressure can cause heart or kidney problems. It can also cause a stroke.  Making lifestyle changes like losing a little weight or eating less salt will help.  Checking your blood pressure at home and at different times of the day can help to control blood pressure.  If the doctor prescribes medicine remember to take it the way the doctor ordered.  Call the office if you cannot afford the medicine or if there are questions about it.     Notes:         This is a list of the screening recommended for you and due dates:  Health Maintenance  Topic Date Due   Screening for Lung Cancer  04/28/2023*   Colon Cancer Screening  04/28/2023*   COVID-19 Vaccine (5 - 2023-24 season) 04/28/2023*   Hepatitis C Screening: USPSTF Recommendation to screen - Ages 18-79 yo.  04/28/2023*   Medicare Annual Wellness Visit  12/06/2023   DTaP/Tdap/Td vaccine (4 - Td or Tdap) 12/24/2028   Flu Shot  Completed   HIV Screening  Completed   Zoster (Shingles) Vaccine  Completed   HPV Vaccine  Aged Out  *Topic was postponed. The date shown is not the original due date.    Advanced directives: Forms are available if you choose in the future to pursue  completion.  This is recommended in order to make sure that your health wishes are honored in the event that you are unable to verbalize them to the provider.    Conditions/risks identified: Aim for 30 minutes of exercise or brisk walking, 6-8 glasses of water, and 5 servings of fruits and vegetables each day.   Next appointment: Follow up in one year for your annual wellness visit    Preventive Care 40-64 Years, Male Preventive care refers to lifestyle choices and visits with your health care provider that can promote health and wellness. What does preventive care include? A yearly physical exam. This is also called an annual well check. Dental exams once or twice a year. Routine eye exams. Ask your health care provider how often you should have your eyes checked. Personal lifestyle choices, including: Daily care of your teeth and gums. Regular physical activity. Eating a healthy diet. Avoiding tobacco and drug use. Limiting alcohol use. Practicing safe sex. Taking low-dose aspirin every day starting at age 31. What happens during an annual well check? The services  and screenings done by your health care provider during your annual well check will depend on your age, overall health, lifestyle risk factors, and family history of disease. Counseling  Your health care provider may ask you questions about your: Alcohol use. Tobacco use. Drug use. Emotional well-being. Home and relationship well-being. Sexual activity. Eating habits. Work and work Statistician. Screening  You may have the following tests or measurements: Height, weight, and BMI. Blood pressure. Lipid and cholesterol levels. These may be checked every 5 years, or more frequently if you are over 30 years old. Skin check. Lung cancer screening. You may have this screening every year starting at age 52 if you have a 30-pack-year history of smoking and currently smoke or have quit within the past 15 years. Fecal occult  blood test (FOBT) of the stool. You may have this test every year starting at age 88. Flexible sigmoidoscopy or colonoscopy. You may have a sigmoidoscopy every 5 years or a colonoscopy every 10 years starting at age 22. Prostate cancer screening. Recommendations will vary depending on your family history and other risks. Hepatitis C blood test. Hepatitis B blood test. Sexually transmitted disease (STD) testing. Diabetes screening. This is done by checking your blood sugar (glucose) after you have not eaten for a while (fasting). You may have this done every 1-3 years. Discuss your test results, treatment options, and if necessary, the need for more tests with your health care provider. Vaccines  Your health care provider may recommend certain vaccines, such as: Influenza vaccine. This is recommended every year. Tetanus, diphtheria, and acellular pertussis (Tdap, Td) vaccine. You may need a Td booster every 10 years. Zoster vaccine. You may need this after age 26. Pneumococcal 13-valent conjugate (PCV13) vaccine. You may need this if you have certain conditions and have not been vaccinated. Pneumococcal polysaccharide (PPSV23) vaccine. You may need one or two doses if you smoke cigarettes or if you have certain conditions. Talk to your health care provider about which screenings and vaccines you need and how often you need them. This information is not intended to replace advice given to you by your health care provider. Make sure you discuss any questions you have with your health care provider. Document Released: 12/10/2015 Document Revised: 08/02/2016 Document Reviewed: 09/14/2015 Elsevier Interactive Patient Education  2017 [employer] Prevention in the Home Falls can cause injuries. They can happen to people of all ages. There are many things you can do to make your home safe and to help prevent falls. What can I do on the outside of my home? Regularly fix the edges of walkways  and driveways and fix any cracks. Remove anything that might make you trip as you walk through a door, such as a raised step or threshold. Trim any bushes or trees on the path to your home. Use bright outdoor lighting. Clear any walking paths of anything that might make someone trip, such as rocks or tools. Regularly check to see if handrails are loose or broken. Make sure that both sides of any steps have handrails. Any raised decks and porches should have guardrails on the edges. Have any leaves, snow, or ice cleared regularly. Use sand or salt on walking paths during winter. Clean up any spills in your garage right away. This includes oil or grease spills. What can I do in the bathroom? Use night lights. Install grab bars by the toilet and in the tub and shower. Do not use towel bars as grab bars.  Use non-skid mats or decals in the tub or shower. If you need to sit down in the shower, use a plastic, non-slip stool. Keep the floor dry. Clean up any water that spills on the floor as soon as it happens. Remove soap buildup in the tub or shower regularly. Attach bath mats securely with double-sided non-slip rug tape. Do not have throw rugs and other things on the floor that can make you trip. What can I do in the bedroom? Use night lights. Make sure that you have a light by your bed that is easy to reach. Do not use any sheets or blankets that are too big for your bed. They should not hang down onto the floor. Have a firm chair that has side arms. You can use this for support while you get dressed. Do not have throw rugs and other things on the floor that can make you trip. What can I do in the kitchen? Clean up any spills right away. Avoid walking on wet floors. Keep items that you use a lot in easy-to-reach places. If you need to reach something above you, use a strong step stool that has a grab bar. Keep electrical cords out of the way. Do not use floor polish or wax that makes  floors slippery. If you must use wax, use non-skid floor wax. Do not have throw rugs and other things on the floor that can make you trip. What can I do with my stairs? Do not leave any items on the stairs. Make sure that there are handrails on both sides of the stairs and use them. Fix handrails that are broken or loose. Make sure that handrails are as long as the stairways. Check any carpeting to make sure that it is firmly attached to the stairs. Fix any carpet that is loose or worn. Avoid having throw rugs at the top or bottom of the stairs. If you do have throw rugs, attach them to the floor with carpet tape. Make sure that you have a light switch at the top of the stairs and the bottom of the stairs. If you do not have them, ask someone to add them for you. What else can I do to help prevent falls? Wear shoes that: Do not have high heels. Have rubber bottoms. Are comfortable and fit you well. Are closed at the toe. Do not wear sandals. If you use a stepladder: Make sure that it is fully opened. Do not climb a closed stepladder. Make sure that both sides of the stepladder are locked into place. Ask someone to hold it for you, if possible. Clearly mark and make sure that you can see: Any grab bars or handrails. First and last steps. Where the edge of each step is. Use tools that help you move around (mobility aids) if they are needed. These include: Canes. Walkers. Scooters. Crutches. Turn on the lights when you go into a dark area. Replace any light bulbs as soon as they burn out. Set up your furniture so you have a clear path. Avoid moving your furniture around. If any of your floors are uneven, fix them. If there are any pets around you, be aware of where they are. Review your medicines with your doctor. Some medicines can make you feel dizzy. This can increase your chance of falling. Ask your doctor what other things that you can do to help prevent falls. This information is  not intended to replace advice given to you by your health  care provider. Make sure you discuss any questions you have with your health care provider. Document Released: 09/09/2009 Document Revised: 04/20/2016 Document Reviewed: 12/18/2014 Elsevier Interactive Patient Education  2017 [employer].

## 2022-12-05 NOTE — Progress Notes (Signed)
Subjective:   Tony Campos is a 57 y.o. male who presents for an Initial Medicare Annual Wellness Visit.  I connected with  [name] on 12/05/22 by a audio enabled telemedicine application and verified that I am speaking with the correct person using two identifiers.  Patient Location: Home  Provider Location: Home Office  I discussed the limitations of evaluation and management by telemedicine. The patient expressed understanding and agreed to proceed.  Review of Systems     Cardiac Risk Factors include: male gender;hypertension;dyslipidemia;sedentary lifestyle;smoking/ tobacco exposure     Objective:    Today's Vitals   12/05/22 1419 12/05/22 1427  Weight: 257 lb (116.6 kg)   Height: '6\' 1"'$  (1.854 m)   PainSc:  6    Body mass index is 33.91 kg/m.     12/05/2022    2:36 PM 07/20/2021    6:50 AM 07/12/2021    4:40 PM 04/06/2021    8:29 AM 03/29/2021    2:48 PM 10/22/2018   10:44 AM 07/26/2018    1:00 AM  Advanced Directives  Does Patient Have a Medical Advance Directive? No No No Yes Yes No No  Type of Production manager of [place];Living will    Does patient want to make changes to medical advance directive?    No - Patient declined No - Patient declined    Would patient like information on creating a medical advance directive? No - Patient declined No - Guardian declined No - Patient declined    No - Patient declined    Current Medications (verified) Outpatient Encounter Medications as of 12/05/2022  Medication Sig   Armodafinil 250 MG tablet Take 250 mg by mouth daily.   aspirin EC 81 MG tablet Take 81 mg by mouth daily.   baclofen (LIORESAL) 10 MG tablet Take 10 mg by mouth 4 (four) times daily.   clonazePAM (KLONOPIN) 1 MG tablet Take 1 mg by mouth 2 (two) times daily.   diclofenac Sodium (VOLTAREN) 1 % GEL Apply topically.   DULoxetine (CYMBALTA) 60 MG capsule Take 60 mg by mouth daily.   lamoTRIgine (LAMICTAL) 200 MG tablet Take 200 mg  by mouth daily.   levothyroxine (SYNTHROID) 125 MCG tablet TAKE 1 TABLET BY MOUTH DAILY BEFORE BREAKFAST.   losartan (COZAAR) 50 MG tablet TAKE 1 TABLET ONCE DAILY.   metoprolol tartrate (LOPRESSOR) 25 MG tablet TAKE (1) TABLET TWICE DAILY.   morphine (MS CONTIN) 15 MG 12 hr tablet Take 15 mg by mouth 3 (three) times daily.   NARCAN 4 MG/0.1ML LIQD nasal spray kit SMARTSIG:1 Spray(s) Both Nares Once PRN   pantoprazole (PROTONIX) 40 MG tablet TAKE (1) TABLET TWICE DAILY.   pregabalin (LYRICA) 150 MG capsule Take 150 mg by mouth 2 (two) times daily.   REXULTI 1 MG TABS tablet Take 1 mg by mouth daily.   rosuvastatin (CRESTOR) 40 MG tablet TAKE 1 TABLET DAILY.   tamsulosin (FLOMAX) 0.4 MG CAPS capsule TAKE 1 CAPSULE DAILY.   triamcinolone cream (KENALOG) 0.1 % Apply 1 application. topically 2 (two) times daily.   valACYclovir (VALTREX) 500 MG tablet TAKE 1 TABLET BY MOUTH ONCE DAILY.   vitamin E 1000 UNIT capsule Take 1,000 Units by mouth daily.   [DISCONTINUED] esomeprazole (NEXIUM) 40 MG capsule Take 40 mg by mouth daily before breakfast.   No facility-administered encounter medications on file as of 12/05/2022.    Allergies (verified) Patient has no known allergies.   History:  Past Medical History:  Diagnosis Date   AAA (abdominal aortic aneurysm) without rupture (HCC)    3.4 cm found on mri 2/20   Acid reflux    Anemia    Anxiety    Aortic atherosclerosis (HCC)    Bipolar disorder (HCC)    Depression    Diverticulitis    Dyspnea    exertional   Genital warts    Hyperlipidemia    Hypertension    Hypothyroidism    Meningitis    PAD (peripheral artery disease) (HCC)    femoral artery angioplasy/stent   SIADH (syndrome of inappropriate ADH production) ([place])    Sleep apnea 12/2015   Smoker    Smoker    Spondylosis    Testosterone deficiency    Past Surgical History:  Procedure Laterality Date   APPENDECTOMY  12/2018   at [facility]  2007   CLOSED  REDUCTION MANDIBLE N/A 03/09/2018   Procedure: CLOSED REDUCTION MANDIBULAR MMF;  Surgeon: Jerrell Belfast, MD;  Location: [place];  Service: ENT;  Laterality: N/A;   COLONOSCOPY  07/26/2020   10/22/2018   DRUG INDUCED ENDOSCOPY N/A 04/06/2021   Procedure: DRUG INDUCED ENDOSCOPY;  Surgeon: Melida Quitter, MD;  Location: [place];  Service: ENT;  Laterality: N/A;   IMPLANTATION OF HYPOGLOSSAL NERVE STIMULATOR Right 07/20/2021   Procedure: IMPLANTATION OF HYPOGLOSSAL NERVE STIMULATOR;  Surgeon: Melida Quitter, MD;  Location: [place];  Service: ENT;  Laterality: Right;   KNEE SURGERY     right   LAPAROSCOPIC APPENDECTOMY N/A 07/25/2018   Procedure: APPENDECTOMY LAPAROSCOPIC;  Surgeon: Aviva Signs, MD;  Location: [facility];  Service: General;  Laterality: N/A;   MANDIBULAR HARDWARE REMOVAL N/A 05/06/2018   Procedure: REMOVAL OF MMF HARDWARE;  Surgeon: Jerrell Belfast, MD;  Location: [place];  Service: ENT;  Laterality: N/A;   [place]     POLYPECTOMY     right sfa recanalization and stenting      [facility] 12/2015   UPPER GASTROINTESTINAL ENDOSCOPY  07/26/2020   WRIST SURGERY     right and left   Family History  Problem Relation Age of Onset   Other Father        killed in [place]   Cancer Maternal Grandfather        stomach   Stomach cancer Maternal Grandfather    Colon cancer Neg Hx    Colitis Neg Hx    Esophageal cancer Neg Hx    Rectal cancer Neg Hx    Social History   Socioeconomic History   Marital status: Married    Spouse name: Not on file   Number of children: 0   Years of education: Not on file   Highest education level: Not on file  Occupational History   Not on file  Tobacco Use   Smoking status: Every Day    Packs/day: 1.00    Years: 33.00    Total pack years: 33.00    Types: Cigarettes   Smokeless tobacco: Never   Tobacco comments:    Currently smoking 3/4ppd as of 08/11/22 ep   Vaping Use   Vaping Use: Every day  Substance and Sexual Activity   Alcohol use: No   Drug use: No   Sexual activity: Not Currently  Other Topics Concern   Not on file  Social History Narrative   Not on file   Social Determinants of Health  Financial Resource Strain: Low Risk  (12/05/2022)   Overall Financial Resource Strain (CARDIA)    Difficulty of Paying Living Expenses: Not hard at all  Food Insecurity: No Food Insecurity (12/05/2022)   Hunger Vital Sign    Worried About Running Out of Food in the Last Year: Never true    Ran Out of Food in the Last Year: Never true  Transportation Needs: No Transportation Needs (12/05/2022)   PRAPARE - Hydrologist (Medical): No    Lack of Transportation (Non-Medical): No  Physical Activity: Inactive (12/05/2022)   Exercise Vital Sign    Days of Exercise per Week: 0 days    Minutes of Exercise per Session: 0 min  Stress: No Stress Concern Present (12/05/2022)   [place]    Feeling of Stress : Only a little  Social Connections: Moderately Integrated (12/05/2022)   Social Connection and Isolation Panel [NHANES]    Frequency of Communication with Friends and Family: More than three times a week    Frequency of Social Gatherings with Friends and Family: Three times a week    Attends Religious Services: 1 to 4 times per year    Active Member of Clubs or Organizations: No    Attends Archivist Meetings: Never    Marital Status: Married    Tobacco Counseling Ready to quit: Not Answered Counseling given: Not Answered Tobacco comments: Currently smoking 3/4ppd as of 08/11/22 ep   Clinical Intake:  Pre-visit preparation completed: Yes  Pain : 0-10 Pain Score: 6  Pain Type: Chronic pain Pain Location: Back  Diabetes: No  How often do you need to have someone help you when you read instructions, pamphlets, or other written materials from  your doctor or pharmacy?: 1 - Never  Diabetic?No   Interpreter Needed?: No  Information entered by :: Denman George LPN   Activities of Daily Living    12/05/2022    2:36 PM  In your present state of health, do you have any difficulty performing the following activities:  Hearing? 0  Vision? 0  Difficulty concentrating or making decisions? 1  Walking or climbing stairs? 1  Dressing or bathing? 0  Doing errands, shopping? 1  Preparing Food and eating ? N  Using the Toilet? N  In the past six months, have you accidently leaked urine? N  Do you have problems with loss of bowel control? N  Managing your Medications? N  Managing your Finances? N  Housekeeping or managing your Housekeeping? N    Patient Care Team: Susy Frizzle, MD as PCP - General (Family Medicine) Harl Bowie Alphonse Guild, MD as PCP - Cardiology (Cardiology) Edythe Clarity, [facility] as Pharmacist (Pharmacist) [facility] Margaretha Sheffield, MD as Consulting Physician (Physical Medicine and Rehabilitation) Noemi Chapel, NP as Nurse Practitioner (Psychiatry) Deneise Lever, MD as Consulting Physician (Pulmonary Disease)  Indicate any recent Medical Services you may have received from other than [facility] providers in the past year (date may be approximate).     Assessment:   This is a routine wellness examination for Tony Campos.  Hearing/Vision screen Hearing Screening - Comments:: Denies hearing difficulties  Vision Screening - Comments:: Wears rx glasses - up to date with routine eye exams with Dr. Katy Fitch    Dietary issues and exercise activities discussed: Current Exercise Habits: The patient does not participate in regular exercise at present   Goals Addressed  This Visit's Progress    Increase physical activity        Depression Screen    12/05/2022    2:34 PM 04/14/2022    9:07 AM 03/31/2021    9:28 AM 06/10/2018    4:24 PM 11/01/2017    2:19 PM 12/09/2015   11:05 AM  PHQ  2/9 Scores  PHQ - 2 Score '3 3 5 2  2  '$ PHQ- 9 Score '6 6 21   21  '$ Exception Documentation     Patient refusal     Fall Risk    12/05/2022    2:33 PM 03/31/2021    9:28 AM 12/09/2015   11:05 AM  [place] in the past year? 0 0 No  Number falls in past yr: 0    Injury with Fall? 0    Risk for fall due to : Impaired mobility;Impaired balance/gait;Medication side effect No Fall Risks   Follow up Falls evaluation completed;Education provided;Falls prevention discussed      FALL RISK PREVENTION PERTAINING TO THE HOME:  Any stairs in or around the home? Yes  If so, are there any without handrails? No  Home free of loose throw rugs in walkways, pet beds, electrical cords, etc? Yes  Adequate lighting in your home to reduce risk of falls? Yes   ASSISTIVE DEVICES UTILIZED TO PREVENT FALLS:  Life alert? No  Use of a cane, walker or w/c? No  Grab bars in the bathroom? Yes  Shower chair or bench in shower? No  Elevated toilet seat or a handicapped toilet? Yes   TIMED UP AND GO:  Was the test performed? No . Telephonic visit   Cognitive Function:      04/18/2016   10:41 AM  Montreal Cognitive Assessment   Visuospatial/ Executive (0/5) 5  Naming (0/3) 3  Attention: Read list of digits (0/2) 1  Attention: Read list of letters (0/1) 1  Attention: Serial 7 subtraction starting at 100 (0/3) 3  Language: Repeat phrase (0/2) 2  Language : Fluency (0/1) 1  Abstraction (0/2) 2  Delayed Recall (0/5) 2  Orientation (0/6) 6  Total 26  Adjusted Score (based on education) 26      12/05/2022    2:37 PM  6CIT Screen  What Year? 0 points  What month? 0 points  What time? 0 points  Count back from 20 0 points  Months in reverse 2 points  Repeat phrase 2 points  Total Score 4 points    Immunizations Immunization History  Administered Date(s) Administered   Influenza Whole 09/07/2008, 09/16/2009, 09/23/2010, 09/04/2012   Influenza,inj,Quad PF,6+ Mos 10/02/2013, 12/24/2018,  08/26/2019, 08/11/2022   Influenza-Unspecified 09/03/2015   Moderna Sars-Covid-2 Vaccination 02/26/2020, 03/25/2020, 10/14/2020, 03/26/2021   PNEUMOCOCCAL CONJUGATE-20 08/11/2022   Pneumococcal Polysaccharide-23 11/12/2013, 06/23/2021   Td 04/06/2009   Tdap 04/06/2009, 12/24/2018   Zoster Recombinat (Shingrix) 06/23/2021, 08/05/2021    TDAP status: Up to date  Flu Vaccine status: Up to date  Pneumococcal vaccine status: Up to date  Covid-19 vaccine status: Information provided on how to obtain vaccines.   Qualifies for Shingles Vaccine? Yes   Zostavax completed No   Shingrix Completed?: Yes  Screening Tests Health Maintenance  Topic Date Due   Lung Cancer Screening  04/28/2023 (Originally 08/31/2022)   COLONOSCOPY (Pts 45-46yr Insurance coverage will need to be confirmed)  04/28/2023 (Originally 07/26/2022)   COVID-19 Vaccine (5 - 2023-24 season) 04/28/2023 (Originally 07/28/2022)   Hepatitis C Screening  04/28/2023 (Originally  09[birth year]01/1986)   Medicare Annual Wellness (AWV)  12/06/2023   DTaP/Tdap/Td (4 - Td or Tdap) 12/24/2028   INFLUENZA VACCINE  Completed   HIV Screening  Completed   Zoster Vaccines- Shingrix  Completed   HPV VACCINES  Aged Out    Health Maintenance  There are no preventive care reminders to display for this patient.   Colorectal cancer screening: Type of screening: Colonoscopy. Completed 07/26/20. Repeat every 2 years  Lung Cancer Screening: (Low Dose CT Chest recommended if Age 45-80 years, 30 pack-year currently smoking OR have quit w/in 15years.) does qualify.   Lung Cancer Screening Referral: last CT was 08/31/21; will discuss repeat with provider at next visit   Additional Screening:  Hepatitis C Screening: does qualify; Completed at next office visit   Vision Screening: Recommended annual ophthalmology exams for early detection of glaucoma and other disorders of the eye. Is the patient up to date with their annual eye exam?  Yes  Who is the  provider or what is the name of the office in which the patient attends annual eye exams? Dr. Katy Fitch  If pt is not established with a provider, would they like to be referred to a provider to establish care? No .   Dental Screening: Recommended annual dental exams for proper oral hygiene  Community Resource Referral / Chronic Care Management: CRR required this visit?  No   CCM required this visit?   Patient currently enrolled in services      Plan:     I have personally reviewed and noted the following in the patient's chart:   Medical and social history Use of alcohol, tobacco or illicit drugs  Current medications and supplements including opioid prescriptions. Patient is currently taking opioid prescriptions. Information provided to patient regarding non-opioid alternatives. Patient advised to discuss non-opioid treatment plan with their provider. Functional ability and status Nutritional status Physical activity Advanced directives List of other physicians Hospitalizations, surgeries, and ER visits in previous 12 months Vitals Screenings to include cognitive, depression, and falls Referrals and appointments  In addition, I have reviewed and discussed with patient certain preventive protocols, quality metrics, and best practice recommendations. A written personalized care plan for preventive services as well as general preventive health recommendations were provided to patient.     Vanetta Mulders, LPN   12/29/1937   Due to this being a virtual visit, the after visit summary with patients personalized plan was offered to patient via mail or my-chart.  Patient would like to access on my-chart  Nurse Notes: No concerns

## 2022-12-06 DIAGNOSIS — G4733 Obstructive sleep apnea (adult) (pediatric): Secondary | ICD-10-CM | POA: Diagnosis not present
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
## 2022-12-18 ENCOUNTER — Telehealth: Payer: Self-pay | Admitting: Pharmacist

## 2022-12-18 NOTE — Progress Notes (Signed)
Care Management & Coordination Services Pharmacy Team  Reason for Encounter: Hypertension  Contacted patient on 12/18/22 and 12/27/22 to discuss hypertension disease state. Attempts were unsuccessful   Recent office visits:  12/05/22 Annual Medicare Wellness Completed   Recent consult visits:  Several Physical therapy visits between 10/18/22 - 09/06/22  Hospital visits:  None in previous 6 months  Medications: Outpatient Encounter Medications as of 12/18/2022  Medication Sig   Armodafinil 250 MG tablet Take 250 mg by mouth daily.   aspirin EC 81 MG tablet Take 81 mg by mouth daily.   baclofen (LIORESAL) 10 MG tablet Take 10 mg by mouth 4 (four) times daily.   clonazePAM (KLONOPIN) 1 MG tablet Take 1 mg by mouth 2 (two) times daily.   diclofenac Sodium (VOLTAREN) 1 % GEL Apply topically.   DULoxetine (CYMBALTA) 60 MG capsule Take 60 mg by mouth daily.   lamoTRIgine (LAMICTAL) 200 MG tablet Take 200 mg by mouth daily.   levothyroxine (SYNTHROID) 125 MCG tablet TAKE 1 TABLET BY MOUTH DAILY BEFORE BREAKFAST.   losartan (COZAAR) 50 MG tablet TAKE 1 TABLET ONCE DAILY.   metoprolol tartrate (LOPRESSOR) 25 MG tablet TAKE (1) TABLET TWICE DAILY.   morphine (MS CONTIN) 15 MG 12 hr tablet Take 15 mg by mouth 3 (three) times daily.   NARCAN 4 MG/0.1ML LIQD nasal spray kit SMARTSIG:1 Spray(s) Both Nares Once PRN   pantoprazole (PROTONIX) 40 MG tablet TAKE (1) TABLET TWICE DAILY.   pregabalin (LYRICA) 150 MG capsule Take 150 mg by mouth 2 (two) times daily.   REXULTI 1 MG TABS tablet Take 1 mg by mouth daily.   rosuvastatin (CRESTOR) 40 MG tablet TAKE 1 TABLET DAILY.   tamsulosin (FLOMAX) 0.4 MG CAPS capsule TAKE 1 CAPSULE DAILY.   triamcinolone cream (KENALOG) 0.1 % Apply 1 application. topically 2 (two) times daily.   valACYclovir (VALTREX) 500 MG tablet TAKE 1 TABLET BY MOUTH ONCE DAILY.   vitamin E 1000 UNIT capsule Take 1,000 Units by mouth daily.   [DISCONTINUED] esomeprazole (NEXIUM) 40 MG  capsule Take 40 mg by mouth daily before breakfast.   No facility-administered encounter medications on file as of 12/18/2022.    Recent Office Vitals: BP Readings from Last 3 Encounters:  08/11/22 136/84  04/14/22 (!) 162/102  03/30/22 (!) 142/90   Pulse Readings from Last 3 Encounters:  08/11/22 69  04/14/22 70  03/30/22 73    Wt Readings from Last 3 Encounters:  12/05/22 257 lb (116.6 kg)  08/11/22 257 lb 9.6 oz (116.8 kg)  04/14/22 248 lb 3.2 oz (112.6 kg)     Kidney Function Lab Results  Component Value Date/Time   CREATININE 1.26 03/24/2022 10:17 AM   CREATININE 0.90 08/31/2021 12:09 PM   CREATININE 1.13 07/14/2021 11:30 AM   CREATININE 1.27 03/31/2021 09:35 AM   GFR 81.16 12/06/2018 12:05 PM   GFRNONAA >60 07/14/2021 11:30 AM   GFRNONAA 65 03/31/2021 09:35 AM   GFRAA 75 03/31/2021 09:35 AM       Latest Ref Rng & Units 03/24/2022   10:17 AM 08/31/2021   12:09 PM 07/14/2021   11:30 AM  BMP  Glucose 65 - 99 mg/dL 98   111   BUN 7 - 25 mg/dL 8   9   Creatinine 0.70 - 1.30 mg/dL 1.26  0.90  1.13   BUN/Creat Ratio 6 - 22 (calc) NOT APPLICABLE     Sodium 950 - 146 mmol/L 147   140   Potassium 3.5 -  5.3 mmol/L 4.8   4.2   Chloride 98 - 110 mmol/L 106   100   CO2 20 - 32 mmol/L 31   33   Calcium 8.6 - 10.3 mg/dL 9.1   9.2      Current antihypertensive regimen:  losartan (COZAAR) 50 MG tablet  metoprolol tartrate (LOPRESSOR) 25 MG tablet    Patient verbally confirms he is taking the above medications as directed.   How often are you checking your Blood Pressure?  Patient reported  he checks his blood pressure   taking his medication.  Current home BP readings:   DATE:             BP               PULSE    Wrist or arm cuff: Caffeine intake: Salt intake: OTC medications including pseudoephedrine or NSAIDs?  Any readings above 180/120?  If yes any symptoms of hypertensive emergency?    What recent interventions/DTPs have been made by any provider  to improve Blood Pressure control since last CPP Visit:  Patient has not had any changes in medications since last visit with CPP  Any recent hospitalizations or ED visits since last visit with CPP? No  What diet changes have been made to improve Blood Pressure Control?  Patient reported  What exercise is being done to improve your Blood Pressure Control?  Patient reported   Adherence Review: Is the patient currently on ACE/ARB medication? Yes Does the patient have >5 day gap between last estimated fill dates? No  Star Rating Drugs:  Medication:    Last Fill: Day Supply Rosuvastatin 40 MG    11/13/22 30 losartan (COZAAR) 50 MG tablet  11/14/22 30     Future Appointments  Date Time Provider [place]  12/19/2022 10:00 AM [facility] PFT ROOM [facility] None  12/19/2022 11:00 AM Baird Lyons D, MD [facility] None  03/21/2023  1:30 PM Dohmeier, Asencion Partridge, MD [facility] None     [facility] [place]

## 2022-12-19 ENCOUNTER — Ambulatory Visit: Payer: Medicare HMO | Admitting: Internal Medicine

## 2022-12-20 ENCOUNTER — Other Ambulatory Visit: Payer: Self-pay | Admitting: Family Medicine

## 2022-12-21 NOTE — Telephone Encounter (Signed)
Requested Prescriptions  Pending Prescriptions Disp Refills   tamsulosin (FLOMAX) 0.4 MG CAPS capsule [Pharmacy Med Name: TAMSULOSIN HCL 0.4 MG CAPSULE] 30 capsule 0    Sig: TAKE 1 CAPSULE DAILY.     Urology: Alpha-Adrenergic Blocker Failed - 12/20/2022  2:09 PM      Failed - PSA in normal range and within 360 days    PSA  Date Value Ref Range Status  03/31/2021 0.70 < OR = 4.00 ng/mL Final    Comment:    The total PSA value from this assay system is  standardized against the WHO standard. The test  result will be approximately 20% lower when compared  to the equimolar-standardized total PSA (Beckman  Coulter). Comparison of serial PSA results should be  interpreted with this fact in mind. . This test was performed using the Siemens  chemiluminescent method. Values obtained from  different assay methods cannot be used interchangeably. PSA levels, regardless of value, should not be interpreted as absolute evidence of the presence or absence of disease.          Passed - Last BP in normal range    BP Readings from Last 1 Encounters:  08/11/22 136/84         Passed - Valid encounter within last 12 months    Recent Outpatient Visits           8 months ago Pneumonia of right lower lobe due to infectious organism   [place] Pickard, Cammie Mcgee, MD   8 months ago Anemia, unspecified type   [place] Susy Frizzle, MD   9 months ago Syncope, unspecified syncope type   [place] Pickard, Cammie Mcgee, MD   1 year ago COPD exacerbation [facility])   [place] Susy Frizzle, MD   1 year ago SOB (shortness of breath)   [place] Pickard, Cammie Mcgee, MD               rosuvastatin (CRESTOR) 40 MG tablet [Pharmacy Med Name: ROSUVASTATIN CALCIUM 40 MG TAB] 30 tablet 0    Sig: TAKE 1 TABLET DAILY.     Cardiovascular:  Antilipid - Statins 2 Failed - 12/20/2022  2:09 PM      Failed - Lipid  Panel in normal range within the last 12 months    Cholesterol  Date Value Ref Range Status  03/31/2021 123 <200 mg/dL Final   LDL Cholesterol (Calc)  Date Value Ref Range Status  03/31/2021 62 mg/dL (calc) Final    Comment:    Reference range: <100 . Desirable range <100 mg/dL for primary prevention;   <70 mg/dL for patients with CHD or diabetic patients  with > or = 2 CHD risk factors. Mar[place] Kitchen LDL-C is now calculated using the Martin-Hopkins  calculation, which is a validated novel method providing  better accuracy than the Friedewald equation in the  estimation of LDL-C.  Cresenciano Genre et al. Annamaria Helling. 9169;450(38): 2061-2068  (http://education.[employer].com/faq/FAQ164)    HDL  Date Value Ref Range Status  03/31/2021 34 (L) > OR = 40 mg/dL Final   Triglycerides  Date Value Ref Range Status  03/31/2021 197 (H) <150 mg/dL Final         Passed - Cr in normal range and within 360 days    Creat  Date Value Ref Range Status  03/24/2022 1.26 0.70 - 1.30 mg/dL Final         Passed - Patient  is not pregnant      Passed - Valid encounter within last 12 months    Recent Outpatient Visits           8 months ago Pneumonia of right lower lobe due to infectious organism   [place] Pickard, Cammie Mcgee, MD   8 months ago Anemia, unspecified type   [place] [facility] Schaumann Cammie Mcgee, MD   9 months ago Syncope, unspecified syncope type   [place] Pickard, Cammie Mcgee, MD   1 year ago COPD exacerbation [facility])   [place] Dennard Schaumann, Cammie Mcgee, MD   1 year ago SOB (shortness of breath)   [place] Pickard, Cammie Mcgee, MD               pantoprazole (PROTONIX) 40 MG tablet [Pharmacy Med Name: PANTOPRAZOLE SOD DR 40 MG TAB] 60 tablet 0    Sig: TAKE (1) TABLET TWICE DAILY.     Gastroenterology: Proton Pump Inhibitors Passed - 12/20/2022  2:09 PM      Passed - Valid encounter within last 12 months     Recent Outpatient Visits           8 months ago Pneumonia of right lower lobe due to infectious organism   [place] Pickard, Cammie Mcgee, MD   8 months ago Anemia, unspecified type   [place] Susy Frizzle, MD   9 months ago Syncope, unspecified syncope type   [place] Pickard, Cammie Mcgee, MD   1 year ago COPD exacerbation [facility])   [place] Dennard Schaumann, Cammie Mcgee, MD   1 year ago SOB (shortness of breath)   [place] Pickard, Cammie Mcgee, MD               losartan (COZAAR) 50 MG tablet [Pharmacy Med Name: LOSARTAN POTASSIUM 50 MG TAB] 30 tablet 0    Sig: TAKE 1 TABLET ONCE DAILY.     Cardiovascular:  Angiotensin Receptor Blockers Failed - 12/20/2022  2:09 PM      Failed - Cr in normal range and within 180 days    Creat  Date Value Ref Range Status  03/24/2022 1.26 0.70 - 1.30 mg/dL Final         Failed - K in normal range and within 180 days    Potassium  Date Value Ref Range Status  03/24/2022 4.8 3.5 - 5.3 mmol/L Final         Failed - Valid encounter within last 6 months    Recent Outpatient Visits           8 months ago Pneumonia of right lower lobe due to infectious organism   [place] Pickard, Cammie Mcgee, MD   8 months ago Anemia, unspecified type   [place] Susy Frizzle, MD   9 months ago Syncope, unspecified syncope type   [place] Pickard, Cammie Mcgee, MD   1 year ago COPD exacerbation [facility])   [place] Denn[place] Schaumann, Cammie Mcgee, MD   1 year ago SOB (shortness of breath)   [place], Cammie Mcgee, MD              Passed - Patient is not pregnant      Passed - Last BP in normal range    BP Readings from Last 1 Encounters:  08/11/22 136/84          levothyroxine (SYNTHROID) 125 MCG tablet [Pharmacy Med Name: LEVOTHYROXINE 125 MCG TABLET] 30 tablet 0    Sig: TAKE 1  TABLET BY MOUTH DAILY BEFORE BREAKFAST.     Endocrinology:  Hypothyroid Agents Failed - 12/20/2022  2:09 PM      Failed - TSH in normal range and within 360 days    TSH  Date Value Ref Range Status  06/23/2021 0.78 0.40 - 4.50 mIU/L Final         Passed - Valid encounter within last 12 months    Recent Outpatient Visits           8 months ago Pneumonia of right lower lobe due to infectious organism   [place] Pickard, Cammie Mcgee, MD   8 months ago Anemia, unspecified type   [place] Susy Frizzle, MD   9 months ago Syncope, unspecified syncope type   [place] Pickard, Cammie Mcgee, MD   1 year ago COPD exacerbation [facility])   [place] Pickard, Cammie Mcgee, MD   1 year ago SOB (shortness of breath)   [place] Pickard, Cammie Mcgee, MD

## 2022-12-26 DIAGNOSIS — M5416 Radiculopathy, lumbar region: Secondary | ICD-10-CM | POA: Diagnosis not present

## 2023-01-01 DIAGNOSIS — M47812 Spondylosis without myelopathy or radiculopathy, cervical region: Secondary | ICD-10-CM | POA: Diagnosis not present

## 2023-01-01 DIAGNOSIS — M47816 Spondylosis without myelopathy or radiculopathy, lumbar region: Secondary | ICD-10-CM | POA: Diagnosis not present

## 2023-01-01 DIAGNOSIS — G894 Chronic pain syndrome: Secondary | ICD-10-CM | POA: Diagnosis not present

## 2023-01-01 DIAGNOSIS — M62830 Muscle spasm of back: Secondary | ICD-10-CM | POA: Diagnosis not present

## 2023-01-06 DIAGNOSIS — G4733 Obstructive sleep apnea (adult) (pediatric): Secondary | ICD-10-CM | POA: Diagnosis not present

## 2023-01-08 DIAGNOSIS — M62830 Muscle spasm of back: Secondary | ICD-10-CM | POA: Diagnosis not present

## 2023-01-08 DIAGNOSIS — M47812 Spondylosis without myelopathy or radiculopathy, cervical region: Secondary | ICD-10-CM | POA: Diagnosis not present

## 2023-01-08 DIAGNOSIS — G894 Chronic pain syndrome: Secondary | ICD-10-CM | POA: Diagnosis not present

## 2023-01-08 DIAGNOSIS — M47816 Spondylosis without myelopathy or radiculopathy, lumbar region: Secondary | ICD-10-CM | POA: Diagnosis not present
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
## 2023-01-22 ENCOUNTER — Ambulatory Visit (INDEPENDENT_AMBULATORY_CARE_PROVIDER_SITE_OTHER): Payer: Medicare HMO | Admitting: Internal Medicine

## 2023-01-22 ENCOUNTER — Ambulatory Visit: Payer: Medicare HMO | Admitting: Internal Medicine

## 2023-01-22 ENCOUNTER — Encounter: Payer: Self-pay | Admitting: Internal Medicine

## 2023-01-22 VITALS — BP 128/82 | HR 99 | Ht 72.0 in | Wt 257.0 lb

## 2023-01-22 DIAGNOSIS — F17200 Nicotine dependence, unspecified, uncomplicated: Secondary | ICD-10-CM

## 2023-01-22 DIAGNOSIS — R69 Illness, unspecified: Secondary | ICD-10-CM | POA: Diagnosis not present

## 2023-01-22 DIAGNOSIS — R0609 Other forms of dyspnea: Secondary | ICD-10-CM | POA: Diagnosis not present

## 2023-01-22 DIAGNOSIS — J449 Chronic obstructive pulmonary disease, unspecified: Secondary | ICD-10-CM | POA: Diagnosis not present

## 2023-01-22 LAB — PULMONARY FUNCTION TEST
DL/VA % pred: 67 %
DL/VA: 2.89 ml/min/mmHg/L
DLCO cor % pred: 60 %
DLCO cor: 18.46 ml/min/mmHg
DLCO unc % pred: 60 %
DLCO unc: 18.46 ml/min/mmHg
FEF 25-75 Post: 1.33 L/sec
FEF 25-75 Pre: 1.33 L/sec
FEF2575-%Change-Post: 0 %
FEF2575-%Pred-Post: 38 %
FEF2575-%Pred-Pre: 38 %
FEV1-%Change-Post: 0 %
FEV1-%Pred-Post: 61 %
FEV1-%Pred-Pre: 60 %
FEV1-Post: 2.48 L
FEV1-Pre: 2.47 L
FEV1FVC-%Change-Post: 0 %
FEV1FVC-%Pred-Pre: 83 %
FEV6-%Change-Post: 0 %
FEV6-%Pred-Post: 73 %
FEV6-%Pred-Pre: 73 %
FEV6-Post: 3.74 L
FEV6-Pre: 3.72 L
FEV6FVC-%Change-Post: 0 %
FEV6FVC-%Pred-Post: 100 %
FEV6FVC-%Pred-Pre: 100 %
FVC-%Change-Post: 0 %
FVC-%Pred-Post: 73 %
FVC-%Pred-Pre: 72 %
FVC-Post: 3.88 L
FVC-Pre: 3.85 L
Post FEV1/FVC ratio: 64 %
Post FEV6/FVC ratio: 96 %
Pre FEV1/FVC ratio: 64 %
Pre FEV6/FVC Ratio: 96 %
RV % pred: 174 %
RV: 3.9 L
TLC % pred: 105 %
TLC: 7.81 L

## 2023-01-22 MED ORDER — TRELEGY ELLIPTA 100-62.5-25 MCG/ACT IN AEPB: 1.0000 | INHALATION_SPRAY | Freq: Every day | RESPIRATORY_TRACT | 0 refills | Status: DC

## 2023-01-22 NOTE — Patient Instructions (Signed)
Order- sample Trelegy inhaler    inhale 1 puff then rinse mouth, once daily.  See if this seems to help your breathing.  We discussed quitting smoking- it really is time.  We also suggested trying an otc mouth-piece for snoring. See if it make a smoother surface so Inspire doesn't rub your tongue raw.  Your dentist might also be abe to help.

## 2023-01-22 NOTE — Progress Notes (Signed)
Full PFT performed today. °

## 2023-01-22 NOTE — Patient Instructions (Signed)
Full PFT performed today. °

## 2023-01-25 ENCOUNTER — Other Ambulatory Visit: Payer: Self-pay | Admitting: Family Medicine

## 2023-01-25 DIAGNOSIS — R000 Tachycardia, unspecified: Secondary | ICD-10-CM

## 2023-01-26 DIAGNOSIS — M5126 Other intervertebral disc displacement, lumbar region: Secondary | ICD-10-CM | POA: Diagnosis not present

## 2023-01-29 DIAGNOSIS — G894 Chronic pain syndrome: Secondary | ICD-10-CM | POA: Diagnosis not present

## 2023-01-29 DIAGNOSIS — M62830 Muscle spasm of back: Secondary | ICD-10-CM | POA: Diagnosis not present

## 2023-01-29 DIAGNOSIS — M47812 Spondylosis without myelopathy or radiculopathy, cervical region: Secondary | ICD-10-CM | POA: Diagnosis not present

## 2023-01-29 DIAGNOSIS — M47816 Spondylosis without myelopathy or radiculopathy, lumbar region: Secondary | ICD-10-CM | POA: Diagnosis not present

## 2023-02-04 DIAGNOSIS — G4733 Obstructive sleep apnea (adult) (pediatric): Secondary | ICD-10-CM | POA: Diagnosis not present

## 2023-02-04 DIAGNOSIS — J449 Chronic obstructive pulmonary disease, unspecified: Secondary | ICD-10-CM | POA: Diagnosis not present

## 2023-02-12 ENCOUNTER — Encounter: Payer: Self-pay | Admitting: Family Medicine

## 2023-02-13 ENCOUNTER — Ambulatory Visit (INDEPENDENT_AMBULATORY_CARE_PROVIDER_SITE_OTHER): Payer: Medicare HMO | Admitting: Family Medicine

## 2023-02-13 ENCOUNTER — Encounter: Payer: Self-pay | Admitting: Family Medicine

## 2023-02-13 VITALS — BP 126/74 | HR 77 | Temp 97.6°F | Ht 72.0 in | Wt 260.0 lb

## 2023-02-13 DIAGNOSIS — E039 Hypothyroidism, unspecified: Secondary | ICD-10-CM

## 2023-02-13 DIAGNOSIS — M5416 Radiculopathy, lumbar region: Secondary | ICD-10-CM

## 2023-02-13 DIAGNOSIS — Z79899 Other long term (current) drug therapy: Secondary | ICD-10-CM

## 2023-02-13 DIAGNOSIS — R0689 Other abnormalities of breathing: Secondary | ICD-10-CM

## 2023-02-13 DIAGNOSIS — M5136 Other intervertebral disc degeneration, lumbar region: Secondary | ICD-10-CM

## 2023-02-13 DIAGNOSIS — I2510 Atherosclerotic heart disease of native coronary artery without angina pectoris: Secondary | ICD-10-CM

## 2023-02-13 NOTE — Progress Notes (Signed)
Subjective:    Patient ID: Tony Campos, male    DOB: Jul 18, 1966, 57 y.o.   MRN: [id number]  MRI 08/2022: Disc levels:   T12- L1: Unremarkable.   L1-L2: Unremarkable.   L2-L3: Disc space narrowing.  No neural impingement   L3-L4: Disc space narrowing and bulging.  No neural impingement   L4-L5: Disc narrowing and bulging with treated left paracentral herniation. There has been left subarticular recess decompression from behind with expected regional fibrosis. No residual or recurrent compressive herniation. Moderate right foraminal narrowing from disc height loss and bulge.   L5-S1:Disc narrowing and bulging with left foraminal and far lateral protrusion. At the far lateral annulus there is enhancement an inflammation could contribute to the small herniation. Left foraminal stenosis is moderate.   Patient has a history of chronic low back pain.  He has a history of previous discectomy performed at L4-L5.  Above his most recent MRI from October of last year.  Patient reports severe pain in his lower back radiating down both legs.  He states that his first few steps in the morning literally "kill him".  Afterwards he only has residual pain radiating down his left leg.  The pain will radiate from his gluteus into his foot.  This occurs with standing and walking.  He recently saw his neurosurgeon, Dr. Arnoldo Morale, who told him that there was nothing that he could do to help.  He was treated with an epidural steroid injection by his pain specialist Dr. Greta Doom who he sees like he is functioning fairly of the left-sided radiculopathy but not long-lasting relief.  He is taking Lyrica, morphine 15 mg 3 times a day, and baclofen.  He receives Klonopin and Rexulti from his psychiatrist.  Despite taking the sedating medications, the patient denies any respiratory depression.  He states that his wife has noticed him acting "drunk" if he does not eat the day before.  However as long as he is eating  well, the medications are not too sedating.  I last saw the patient in May of last year after he had right-sided pneumonia and was hospitalized.  Unfortunately he continues to smoke.  He has prominent right-sided Rales and rhonchi.  He denies any fever or chills or hemoptysis.  He declines work.  His blood pressure today is excellent.  Unfortunately he has gained a fair amount of weight since I last saw him.  He admits that he is drinking [facility] every day excessively.  I discussed with him that weight only contributes to his back pain and he is working on his diet as we speak.  Patient recently requested a referral to a different pain clinic as they perform other modalities to help manage pain.  He is not looking for pain medication.  He is looking for more osteopathic measures to try to help with the pain.  Past Medical History:  Diagnosis Date   AAA (abdominal aortic aneurysm) without rupture (HCC)    3.4 cm found on mri 2/20   Acid reflux    Anemia    Anxiety    Aortic atherosclerosis (HCC)    Bipolar disorder (HCC)    Depression    Diverticulitis    Dyspnea    exertional   Genital warts    Hyperlipidemia    Hypertension    Hypothyroidism    Meningitis    PAD (peripheral artery disease) (HCC)    femoral artery angioplasy/stent   SIADH (syndrome of inappropriate ADH production) ([place])  Sleep apnea 12/2015   Smoker    Smoker    Spondylosis    Testosterone deficiency    Past Surgical History:  Procedure Laterality Date   APPENDECTOMY  12/2018   at [place]  2007   CLOSED REDUCTION MANDIBLE N/A 03/09/2018   Procedure: CLOSED REDUCTION MANDIBULAR MMF;  Surgeon: Jerrell Belfast, MD;  Location: [place];  Service: ENT;  Laterality: N/A;   COLONOSCOPY  07/26/2020   10/22/2018   DRUG INDUCED ENDOSCOPY N/A 04/06/2021   Procedure: DRUG INDUCED ENDOSCOPY;  Surgeon: Melida Quitter, MD;  Location: [place];  Service: ENT;  Laterality: N/A;    IMPLANTATION OF HYPOGLOSSAL NERVE STIMULATOR Right 07/20/2021   Procedure: IMPLANTATION OF HYPOGLOSSAL NERVE STIMULATOR;  Surgeon: Melida Quitter, MD;  Location: [place];  Service: ENT;  Laterality: Right;   KNEE SURGERY     right   LAPAROSCOPIC APPENDECTOMY N/A 07/25/2018   Procedure: APPENDECTOMY LAPAROSCOPIC;  Surgeon: Aviva Signs, MD;  Location: [facility];  Service: General;  Laterality: N/A;   MANDIBULAR HARDWARE REMOVAL N/A 05/06/2018   Procedure: REMOVAL OF MMF HARDWARE;  Surgeon: Jerrell Belfast, MD;  Location: [place];  Service: ENT;  Laterality: N/A;   NASAL SEPTUM SURGERY     NASAL SINUS SURGERY     POLYPECTOMY     right sfa recanalization and stenting      [facility] 12/2015   UPPER GASTROINTESTINAL ENDOSCOPY  07/26/2020   WRIST SURGERY     right and left   Current Outpatient Medications on File Prior to Visit  Medication Sig Dispense Refill   Armodafinil 250 MG tablet Take 250 mg by mouth daily.     aspirin EC 81 MG tablet Take 81 mg by mouth daily.     baclofen (LIORESAL) 10 MG tablet Take 10 mg by mouth 4 (four) times daily.     clonazePAM (KLONOPIN) 1 MG tablet Take 1 mg by mouth 2 (two) times daily.     DULoxetine (CYMBALTA) 60 MG capsule Take 60 mg by mouth daily.     lamoTRIgine (LAMICTAL) 200 MG tablet Take 200 mg by mouth daily.     levothyroxine (SYNTHROID) 125 MCG tablet TAKE 1 TABLET BY MOUTH DAILY BEFORE BREAKFAST. 30 tablet 0   metoprolol tartrate (LOPRESSOR) 25 MG tablet TAKE (1) TABLET TWICE DAILY. 60 tablet 0   morphine (MS CONTIN) 15 MG 12 hr tablet Take 15 mg by mouth 3 (three) times daily.     NARCAN 4 MG/0.1ML LIQD nasal spray kit SMARTSIG:1 Spray(s) Both Nares Once PRN     pantoprazole (PROTONIX) 40 MG tablet TAKE (1) TABLET TWICE DAILY. 60 tablet 0   pregabalin (LYRICA) 150 MG capsule Take 150 mg by mouth 2 (two) times daily.     REXULTI 1 MG TABS tablet Take 1 mg by mouth daily.     rosuvastatin (CRESTOR) 40 MG tablet  TAKE 1 TABLET DAILY. 30 tablet 0   tamsulosin (FLOMAX) 0.4 MG CAPS capsule TAKE 1 CAPSULE DAILY. 30 capsule 0   valACYclovir (VALTREX) 500 MG tablet TAKE 1 TABLET BY MOUTH ONCE DAILY. 30 tablet 0   vitamin E 1000 UNIT capsule Take 1,000 Units by mouth daily.     diclofenac Sodium (VOLTAREN) 1 % GEL Apply topically. (Patient not taking: Reported on 02/13/2023)     Fluticasone-Umeclidin-Vilant (TRELEGY ELLIPTA) 100-62.5-25 MCG/ACT AEPB Inhale 1 puff into the lungs daily. (Patient not taking: Reported on 02/13/2023) 1 each 0   losartan (  COZAAR) 50 MG tablet TAKE 1 TABLET ONCE DAILY. (Patient not taking: Reported on 02/13/2023) 30 tablet 0   triamcinolone cream (KENALOG) 0.1 % Apply 1 application. topically 2 (two) times daily. (Patient not taking: Reported on 02/13/2023) 45 g 0   [DISCONTINUED] esomeprazole (NEXIUM) 40 MG capsule Take 40 mg by mouth daily before breakfast.     No current facility-administered medications on file prior to visit.      No Known Allergies Social History   Socioeconomic History   Marital status: Married    Spouse name: Not on file   Number of children: 0   Years of education: Not on file   Highest education level: Not on file  Occupational History   Not on file  Tobacco Use   Smoking status: Every Day    Packs/day: 1.00    Years: 33.00    Additional pack years: 0.00    Total pack years: 33.00    Types: Cigarettes    Passive exposure: Current   Smokeless tobacco: Never   Tobacco comments:    Less than 1/2 pack per day 01/22/23 PAP  Vaping Use   Vaping Use: Every day  Substance and Sexual Activity   Alcohol use: No   Drug use: No   Sexual activity: Not Currently  Other Topics Concern   Not on file  Social History Narrative   Not on file   Social Determinants of Health   Financial Resource Strain: Low Risk  (12/05/2022)   Overall Financial Resource Strain (CARDIA)    Difficulty of Paying Living Expenses: Not hard at all  Food Insecurity: No Food  Insecurity (12/05/2022)   Hunger Vital Sign    Worried About Running Out of Food in the Last Year: Never true    Ran Out of Food in the Last Year: Never true  Transportation Needs: No Transportation Needs (12/05/2022)   PRAPARE - Hydrologist (Medical): No    Lack of Transportation (Non-Medical): No  Physical Activity: Inactive (12/05/2022)   Exercise Vital Sign    Days of Exercise per Week: 0 days    Minutes of Exercise per Session: 0 min  Stress: No Stress Concern Present (12/05/2022)   [place]    Feeling of Stress : Only a little  Social Connections: Moderately Integrated (12/05/2022)   Social Connection and Isolation Panel [NHANES]    Frequency of Communication with Friends and Family: More than three times a week    Frequency of Social Gatherings with Friends and Family: Three times a week    Attends Religious Services: 1 to 4 times per year    Active Member of Clubs or Organizations: No    Attends Archivist Meetings: Never    Marital Status: Married  Human resources officer Violence: Not At Risk (12/05/2022)   Humiliation, Afraid, Rape, and Kick questionnaire    Fear of Current or Ex-Partner: No    Emotionally Abused: No    Physically Abused: No    Sexually Abused: No      Review of Systems  All other systems reviewed and are negative.      Objective:   Physical Exam Vitals reviewed.  Constitutional:      General: He is not in acute distress.    Appearance: Normal appearance. He is not ill-appearing, toxic-appearing or diaphoretic.  HENT:     Head: Normocephalic and atraumatic.  Cardiovascular:     Rate and Rhythm:  Normal rate and regular rhythm.     Heart sounds: Normal heart sounds. No murmur heard.    No friction rub. No gallop.  Pulmonary:     Effort: Pulmonary effort is normal.     Breath sounds: No wheezing or rhonchi.  Musculoskeletal:     Lumbar back: Tenderness  and bony tenderness present. Decreased range of motion.     Right lower leg: No edema.     Left lower leg: No edema.  Neurological:     General: No focal deficit present.     Mental Status: He is alert and oriented to person, place, and time.         Assessment & Plan:  Acquired hypothyroidism - Plan: TSH  ASCVD (arteriosclerotic cardiovascular disease) - Plan: CBC with Differential/Platelet, COMPLETE METABOLIC PANEL WITH GFR, Lipid panel  Abnormal breath sounds - Plan: DG Chest 2 View  DDD (degenerative disc disease), lumbar - Plan: Ambulatory referral to Pain Clinic  Left lumbar radiculopathy - Plan: Ambulatory referral to Pain Clinic  Polypharmacy - Plan: Ambulatory referral to Pain Clinic I asked the patient to be seen today because I am concerned about his underlying medical comorbidities.  I encouraged smoking cessation.  He has known COPD as well as cardiovascular disease and smoking increased long-term morbidity in the tally of both of these.  His blood pressure today is well-controlled.  I would like to check a fasting lipid panel.  I will keep his LDL cholesterol less than 70.  He has a history of hypothyroidism and he is due to check a TSH.  I will also monitor a CMP and a CBC.  I am concerned by his weight gain.  We discussed GLP-1 agonist however he feels that he can lose weight if he decreases his consumption of soda which I certainly support.  I am also concerned about his abnormal breath sounds in his right lower lung.  I recommended get a chest x-ray to evaluate this further particular given his smoking history.  I am concerned about polypharmacy.  Patient is on multiple sedating medications from different specialist including Klonopin, Rexulti, morphine, Lyrica, and baclofen.  Yet he has persistent pain.  Therefore I am willing to refer the patient to [facility] to see if they have any other therapeutic options short of additional medication.  He is due to recheck his AAA next  year as it was last checked on an ultrasound in June 2023

## 2023-02-14 ENCOUNTER — Encounter: Payer: Self-pay | Admitting: Internal Medicine

## 2023-02-14 DIAGNOSIS — J449 Chronic obstructive pulmonary disease, unspecified: Secondary | ICD-10-CM | POA: Insufficient documentation

## 2023-02-14 LAB — CBC WITH DIFFERENTIAL/PLATELET
Absolute Monocytes: 800 cells/uL (ref 200–950)
Basophils Absolute: 28 cells/uL (ref 0–200)
Basophils Relative: 0.3 %
Eosinophils Absolute: 0 cells/uL — ABNORMAL LOW (ref 15–500)
Eosinophils Relative: 0 %
HCT: 36.9 % — ABNORMAL LOW (ref 38.5–50.0)
Hemoglobin: 12.2 g/dL — ABNORMAL LOW (ref 13.2–17.1)
Lymphs Abs: 2530 cells/uL (ref 850–3900)
MCH: 28.3 pg (ref 27.0–33.0)
MCHC: 33.1 g/dL (ref 32.0–36.0)
MCV: 85.6 fL (ref 80.0–100.0)
MPV: 10.1 fL (ref 7.5–12.5)
Monocytes Relative: 8.6 %
Neutro Abs: 5943 cells/uL (ref 1500–7800)
Neutrophils Relative %: 63.9 %
Platelets: 169 10*3/uL (ref 140–400)
RBC: 4.31 10*6/uL (ref 4.20–5.80)
RDW: 14.8 % (ref 11.0–15.0)
Total Lymphocyte: 27.2 %
WBC: 9.3 10*3/uL (ref 3.8–10.8)

## 2023-02-14 LAB — LIPID PANEL
Cholesterol: 110 mg/dL (ref <200)
HDL: 35 mg/dL — ABNORMAL LOW (ref >=40)
LDL Cholesterol (Calc): 47 mg/dL (calc)
Non-HDL Cholesterol (Calc): 75 mg/dL (calc) (ref <130)
Total CHOL/HDL Ratio: 3.1 (calc) (ref <5.0)
Triglycerides: 211 mg/dL — ABNORMAL HIGH (ref <150)

## 2023-02-14 LAB — COMPLETE METABOLIC PANEL WITH GFR
AG Ratio: 1.9 (calc) (ref 1.0–2.5)
ALT: 4 U/L — ABNORMAL LOW (ref 9–46)
AST: 10 U/L (ref 10–35)
Albumin: 3.6 g/dL (ref 3.6–5.1)
Alkaline phosphatase (APISO): 92 U/L (ref 35–144)
BUN: 8 mg/dL (ref 7–25)
CO2: 36 mmol/L — ABNORMAL HIGH (ref 20–32)
Calcium: 8.6 mg/dL (ref 8.6–10.3)
Chloride: 101 mmol/L (ref 98–110)
Creat: 0.97 mg/dL (ref 0.70–1.30)
Globulin: 1.9 g/dL (calc) (ref 1.9–3.7)
Glucose, Bld: 87 mg/dL (ref 65–99)
Potassium: 4.2 mmol/L (ref 3.5–5.3)
Sodium: 142 mmol/L (ref 135–146)
Total Bilirubin: 0.6 mg/dL (ref 0.2–1.2)
Total Protein: 5.5 g/dL — ABNORMAL LOW (ref 6.1–8.1)
eGFR: 93 mL/min/{1.73_m2} (ref >=60)

## 2023-02-14 LAB — TSH: TSH: 1.44 mIU/L (ref 0.40–4.50)

## 2023-02-14 NOTE — Assessment & Plan Note (Signed)
Primarily emphysema pattern currently with little bronchitis/reactive airways response to bronchodilators.  This corresponds to his experience that bronchodilator inhalers do not seem to do very much for him.  He does not have significant active cough, wheeze or phlegm.  It remains really important that he stop smoking.

## 2023-02-14 NOTE — Assessment & Plan Note (Signed)
Emphasis again on smoking cessation with discussion of available support.  To use Chantix again.  Has patches and is encouraged to try to use them consistently enough to benefit.

## 2023-02-20 ENCOUNTER — Ambulatory Visit (HOSPITAL_COMMUNITY)
Admission: RE | Admit: 2023-02-20 | Discharge: 2023-02-20 | Disposition: A | Discharge status: Home / Self Care | Payer: Medicare HMO | Source: Ambulatory Visit | Attending: Family Medicine | Admitting: Family Medicine

## 2023-02-20 DIAGNOSIS — J449 Chronic obstructive pulmonary disease, unspecified: Secondary | ICD-10-CM | POA: Diagnosis not present

## 2023-02-20 DIAGNOSIS — R0689 Other abnormalities of breathing: Secondary | ICD-10-CM

## 2023-02-20 DIAGNOSIS — R0989 Other specified symptoms and signs involving the circulatory and respiratory systems: Secondary | ICD-10-CM | POA: Diagnosis not present

## 2023-02-22 ENCOUNTER — Encounter: Payer: Self-pay | Admitting: Physical Medicine & Rehabilitation

## 2023-02-27 DIAGNOSIS — G894 Chronic pain syndrome: Secondary | ICD-10-CM | POA: Diagnosis not present

## 2023-02-27 DIAGNOSIS — M47812 Spondylosis without myelopathy or radiculopathy, cervical region: Secondary | ICD-10-CM | POA: Diagnosis not present

## 2023-02-27 DIAGNOSIS — M62830 Muscle spasm of back: Secondary | ICD-10-CM | POA: Diagnosis not present

## 2023-02-27 DIAGNOSIS — M47816 Spondylosis without myelopathy or radiculopathy, lumbar region: Secondary | ICD-10-CM | POA: Diagnosis not present

## 2023-03-07 ENCOUNTER — Other Ambulatory Visit: Payer: Self-pay

## 2023-03-07 DIAGNOSIS — G4733 Obstructive sleep apnea (adult) (pediatric): Secondary | ICD-10-CM | POA: Diagnosis not present

## 2023-03-07 DIAGNOSIS — R000 Tachycardia, unspecified: Secondary | ICD-10-CM

## 2023-03-07 DIAGNOSIS — J449 Chronic obstructive pulmonary disease, unspecified: Secondary | ICD-10-CM | POA: Diagnosis not present

## 2023-03-07 NOTE — Telephone Encounter (Signed)
Prescription Request  03/07/2023  LOV: 02/13/23  What is the name of the medication or equipment? pantoprazole (PROTONIX) 40 MG tablet [[id number]]   Have you contacted your pharmacy to request a refill? Yes   Which pharmacy would you like this sent to? [facility] - [place], [place] - [address] [place], [place] [address] Phone: [phone]  Fax: [phone]     Patient notified that their request is being sent to the clinical staff for review and that they should receive a response within 2 business days.   Please advise at [facility] [phone]  Prescription Request  03/07/2023  LOV: 02/13/23  What is the name of the medication or equipment? rosuvastatin (CRESTOR) 40 MG tablet [[id number]]  Have you contacted your pharmacy to request a refill? Yes   Which pharmacy would you like this sent to?  [facility] - [place], [place] - [address] [place], [place] [address] Phone: [phone]  Fax: [phone]    Patient notified that their request is being sent to the clinical staff for review and that they should receive a response within 2 business days.   Please advise at [facility] [phone]  Prescription Request  03/07/2023  LOV: 02/13/23  What is the name of the medication or equipment? losartan (COZAAR) 50 MG tablet [[id number]]  Have you contacted your pharmacy to request a refill? Yes   Which pharmacy would you like this sent to?  [facility] - [place], [place] - [address]  [address], [place]   Patient notified that their request is being sent to the clinical staff for review and that they should receive a response within 2 business days.   Please advise at [facility] [phone]  Prescription Request  03/07/2023  LOV: 02/13/23  What is the name of the medication or equipment? levothyroxine (SYNTHROID) 125 MCG tablet [[id number]]   Have you contacted your pharmacy to request a refill? Yes   Which  pharmacy would you like this sent to?  [facility] - [place], [place] - [address] [place], [place] [address] Phone: [phone]  Fax: [phone]-    Patient notified that their request is being sent to the clinical staff for review and that they should receive a response within 2 business days.   Please advise at [facility] [phone]  Prescription Request  03/07/2023  LOV: 02/13/23  What is the name of the medication or equipment? valACYclovir (VALTREX) 500 MG tablet [[id number]]   Have you contacted your pharmacy to request a refill? Yes   Which pharmacy would you like this sent to?  [facility] - [place], [place] - [address] [place], [place] [address] Phone: [phone]  Fax: [phone] DEA #: --    Patient notified that their request is being sent to the clinical staff for review and that they should receive a response within 2 business days.   Please advise at [facility] [phone]  Prescription Request  03/07/2023  LOV: 02/13/23  What is the name of the medication or equipment? metoprolol tartrate (LOPRESSOR) 25 MG tablet [[id number]]  Have you contacted your pharmacy to request a refill? Yes   Which pharmacy would you like this sent to?  [facility] - [place], [place] - [address] [place], [place] [address] Phone: [phone]  Fax: [phone]    Patient notified that their request is being sent to the clinical staff for review and that they should receive a response within 2 business days.   Please advise at [facility] [phone]

## 2023-03-08 ENCOUNTER — Other Ambulatory Visit: Payer: Self-pay | Admitting: Family Medicine

## 2023-03-08 MED ORDER — LOSARTAN POTASSIUM 50 MG PO TABS: 50.0000 mg | ORAL_TABLET | Freq: Every day | ORAL | 0 refills | Status: DC

## 2023-03-08 MED ORDER — LEVOTHYROXINE SODIUM 125 MCG PO TABS: 125.0000 ug | ORAL_TABLET | Freq: Every day | ORAL | 0 refills | Status: DC

## 2023-03-08 MED ORDER — PANTOPRAZOLE SODIUM 40 MG PO TBEC: DELAYED_RELEASE_TABLET | ORAL | 0 refills | Status: DC

## 2023-03-08 MED ORDER — VALACYCLOVIR HCL 500 MG PO TABS: 500.0000 mg | ORAL_TABLET | Freq: Every day | ORAL | 0 refills | Status: DC

## 2023-03-08 MED ORDER — METOPROLOL TARTRATE 25 MG PO TABS: ORAL_TABLET | ORAL | 0 refills | Status: DC

## 2023-03-08 MED ORDER — ROSUVASTATIN CALCIUM 40 MG PO TABS: 40.0000 mg | ORAL_TABLET | Freq: Every day | ORAL | 3 refills | Status: DC

## 2023-03-08 NOTE — Telephone Encounter (Signed)
Requested Prescriptions  Pending Prescriptions Disp Refills   levothyroxine (SYNTHROID) 125 MCG tablet 90 tablet 0    Sig: Take 1 tablet (125 mcg total) by mouth daily before breakfast.     Endocrinology:  Hypothyroid Agents Passed - 03/07/2023  3:24 PM      Passed - TSH in normal range and within 360 days    TSH  Date Value Ref Range Status  02/13/2023 1.44 0.40 - 4.50 mIU/L Final         Passed - Valid encounter within last 12 months    Recent Outpatient Visits           10 months ago Pneumonia of right lower lobe due to infectious organism   [facility] Pickard, Priscille Heidelberg, MD   11 months ago Anemia, unspecified type   [facility] Donita Brooks, MD   11 months ago Syncope, unspecified syncope type   [facility] Pickard, Priscille Heidelberg, MD   1 year ago COPD exacerbation (HCC)   [facility] Tanya Nones, Priscille Heidelberg, MD   1 year ago SOB (shortness of breath)   [facility] Pickard, Priscille Heidelberg, MD       Future Appointments             In 2 weeks Fanny Dance, MD [facility], CPR             pantoprazole (PROTONIX) 40 MG tablet 180 tablet 0    Sig: TAKE (1) TABLET TWICE DAILY.     Gastroenterology: Proton Pump Inhibitors Passed - 03/07/2023  3:24 PM      Passed - Valid encounter within last 12 months    Recent Outpatient Visits           10 months ago Pneumonia of right lower lobe due to infectious organism   [facility] Pickard, Priscille Heidelberg, MD   11 months ago Anemia, unspecified type   [facility] Donita Brooks, MD   11 months ago Syncope, unspecified syncope type   [facility] Pickard, Priscille Heidelberg, MD   1 year ago COPD exacerbation [facility])   [facility] Donita Brooks, MD   1 year ago SOB (shortness of breath)   [facility] Donita Brooks, MD       Future  Appointments             In 2 weeks Fanny Dance, MD [facility] Physical Medicine & Rehabilitation, CPR             rosuvastatin (CRESTOR) 40 MG tablet 30 tablet 0    Sig: Take 1 tablet (40 mg total) by mouth daily.     Cardiovascular:  Antilipid - Statins 2 Failed - 03/07/2023  3:24 PM      Failed - Lipid Panel in normal range within the last 12 months    Cholesterol  Date Value Ref Range Status  02/13/2023 110 <200 mg/dL Final   LDL Cholesterol (Calc)  Date Value Ref Range Status  02/13/2023 47 mg/dL (calc) Final    Comment:    Reference range: <100 . Desirable range <100 mg/dL for primary prevention;   <70 mg/dL for patients with CHD or diabetic patients  with > or = 2 CHD risk factors. Marland Kitchen LDL-C is now calculated using the Martin-Hopkins  calculation, which is a validated novel method providing  better accuracy than  the Friedewald equation in the  estimation of LDL-C.  Horald PollenMartin SS et al. Lenox AhrJAMA. 1610;960(452013;310(19): 2061-2068  (http://education.Quest[employer].com/faq/FAQ164)    HDL  Date Value Ref Range Status  02/13/2023 35 (L) > OR = 40 mg/dL Final   Triglycerides  Date Value Ref Range Status  02/13/2023 211 (H) <150 mg/dL Final    Comment:    . If a non-fasting specimen was collected, consider repeat triglyceride testing on a fasting specimen if clinically indicated.  Perry MountJacobson et al. J. of Clin. Lipidol. 2015;9:129-169. Marland Kitchen.          Passed - Cr in normal range and within 360 days    Creat  Date Value Ref Range Status  02/13/2023 0.97 0.70 - 1.30 mg/dL Final         Passed - Patient is not pregnant      Passed - Valid encounter within last 12 months    Recent Outpatient Visits           10 months ago Pneumonia of right lower lobe due to infectious organism   [facility] Pickard, Priscille HeidelbergWarren T, MD   11 months ago Anemia, unspecified type   [facility] Donita BrooksPickard, Warren T, MD   11 months ago Syncope, unspecified syncope  type   [facility] Pickard, Priscille HeidelbergWarren T, MD   1 year ago COPD exacerbation (HCC)   [facility] Tanya NonesPickard, Priscille HeidelbergWarren T, MD   1 year ago SOB (shortness of breath)   [facility] Pickard, Priscille HeidelbergWarren T, MD       Future Appointments             In 2 weeks Fanny DanceShtridelman, Yuri, MD [facility], CPR             metoprolol tartrate (LOPRESSOR) 25 MG tablet 180 tablet 0    Sig: TAKE (1) TABLET TWICE DAILY.     Cardiovascular:  Beta Blockers Failed - 03/07/2023  3:24 PM      Failed - Valid encounter within last 6 months    Recent Outpatient Visits           10 months ago Pneumonia of right lower lobe due to infectious organism   [facility] Pickard, Priscille HeidelbergWarren T, MD   11 months ago Anemia, unspecified type   [facility] Donita BrooksPickard, Warren T, MD   11 months ago Syncope, unspecified syncope type   [facility] Pickard, Priscille HeidelbergWarren T, MD   1 year ago COPD exacerbation [facility](HCC)   [facility] Tanya NonesPickard, Priscille HeidelbergWarren T, MD   1 year ago SOB (shortness of breath)   [facility] Pickard, Priscille HeidelbergWarren T, MD       Future Appointments             In 2 weeks Fanny DanceShtridelman, Yuri, MD [facility], CPR            Passed - Last BP in normal range    BP Readings from Last 1 Encounters:  02/13/23 126/74         Passed - Last Heart Rate in normal range    Pulse Readings from Last 1 Encounters:  02/13/23 77          losartan (COZAAR) 50 MG tablet 90 tablet 0    Sig: Take 1 tablet (50 mg total) by mouth daily.     Cardiovascular:  Angiotensin Receptor Blockers Failed -  03/07/2023  3:24 PM      Failed - Valid encounter within last 6 months    Recent Outpatient Visits           10 months ago Pneumonia of right lower lobe due to infectious organism   [facility] Pickard, Priscille Heidelberg, MD   11 months ago Anemia,  unspecified type   [facility] Tanya Nones, Priscille Heidelberg, MD   11 months ago Syncope, unspecified syncope type   [facility] Pickard, Priscille Heidelberg, MD   1 year ago COPD exacerbation (HCC)   [facility] Donita Brooks, MD   1 year ago SOB (shortness of breath)   [facility] Pickard, Priscille Heidelberg, MD       Future Appointments             In 2 weeks Fanny Dance, MD [facility] Physical Medicine & Rehabilitation, CPR            Passed - Cr in normal range and within 180 days    Creat  Date Value Ref Range Status  02/13/2023 0.97 0.70 - 1.30 mg/dL Final         Passed - K in normal range and within 180 days    Potassium  Date Value Ref Range Status  02/13/2023 4.2 3.5 - 5.3 mmol/L Final         Passed - Patient is not pregnant      Passed - Last BP in normal range    BP Readings from Last 1 Encounters:  02/13/23 126/74          valACYclovir (VALTREX) 500 MG tablet 90 tablet 0    Sig: Take 1 tablet (500 mg total) by mouth daily.     Antimicrobials:  Antiviral Agents - Anti-Herpetic Passed - 03/07/2023  3:24 PM      Passed - Valid encounter within last 12 months    Recent Outpatient Visits           10 months ago Pneumonia of right lower lobe due to infectious organism   [facility] Pickard, Priscille Heidelberg, MD   11 months ago Anemia, unspecified type   [facility] Donita Brooks, MD   11 months ago Syncope, unspecified syncope type   [facility] Pickard, Priscille Heidelberg, MD   1 year ago COPD exacerbation [facility])   [facility] Donita Brooks, MD   1 year ago SOB (shortness of breath)   [facility] Pickard, Priscille Heidelberg, MD       Future Appointments             In 2 weeks Fanny Dance, MD [facility] Physical Medicine & Rehabilitation, CPR

## 2023-03-21 ENCOUNTER — Encounter: Payer: Self-pay | Admitting: Neurology

## 2023-03-21 ENCOUNTER — Ambulatory Visit: Payer: Medicare HMO | Admitting: Neurology

## 2023-03-21 ENCOUNTER — Telehealth: Payer: Self-pay | Admitting: Neurology

## 2023-03-21 VITALS — Ht 71.0 in | Wt 255.0 lb

## 2023-03-21 DIAGNOSIS — G4710 Hypersomnia, unspecified: Secondary | ICD-10-CM | POA: Diagnosis not present

## 2023-03-21 DIAGNOSIS — G4734 Idiopathic sleep related nonobstructive alveolar hypoventilation: Secondary | ICD-10-CM

## 2023-03-21 DIAGNOSIS — Z9682 Presence of neurostimulator: Secondary | ICD-10-CM | POA: Diagnosis not present

## 2023-03-21 DIAGNOSIS — F11288 Opioid dependence with other opioid-induced disorder: Secondary | ICD-10-CM | POA: Diagnosis not present

## 2023-03-21 DIAGNOSIS — R69 Illness, unspecified: Secondary | ICD-10-CM | POA: Diagnosis not present

## 2023-03-21 NOTE — Progress Notes (Addendum)
Provider:  Melvyn Novas, MD  Primary Care Physician:  Donita Brooks, MD [address] [place] [address]     Referring Provider: Donita Brooks, Md [address] [place],  [place] [address]          Chief Complaint according to patient   Patient presents with:     New Patient (Initial Visit)           HISTORY OF PRESENT ILLNESS:  Tony Campos is a 57 y.o. male patient who is here for revisit 03/21/2023 for  INSPIRE.  Chief concern according to patient :   He reports needing to wear upper and lower dentures for preventing Tongue blisters. He has only one tooth left and the tongue rubs against that point . He chose to reduce the V on inspire  and 3.1 v to 2.9 V. Compliance is low , we spoke at length _ he may fall asleep not switching the device on, he may not pause the device but switch it off at night? He is set up for 8 hours of service time,  He is hypersomnic , he takes Armodafinil. He is on morphine.          ISSAI Campos is a 57 y.o. male , seen for inspire  compliance - follow up on sleep study titration.  Implantation was on 07-20-2021, by Dr Jenne Pane.    Rv on inspire : 03-20-2022: The patient had undergone a inspire titration on 11-18-2021.  His implantation took place on 07-20-2021 by Dr. Annalee Genta.  The device was activated in October last year voltage was set to 2.4 V.  The patient was comfortable at the setting the sufficient tongue movement observed.  He has a overall leg between obstructive sleep apnea and COPD and he is also on pain management.  During his study the inspire was turned on after the patient fell asleep and the baseline AHI without inspire was 41.2/h.  After inspire was initiated at 3.1 V his AHI was 1.8 only so a significant improvement.   Higher voltage did not correlate to more control.  He did have hypoxia and required 1 L of oxygen.     The lowest oxygen saturation was 73%.  There were no limb movements.   There was only mild snoring remaining.  He Did inspire therefore at a 3.1 V setting and ordered 1 L oxygen in addition.  I do think that the oxygen need may be related to narcotic pain medication.  He but he confirms that sometimes when he sees his primary care physician he was tested with low oxygen levels in daytime and on other days its not present and he does not feel short of breath or low on oxygen.       Review of Systems: Out of a complete 14 system review, the patient complains of only the following symptoms, and all other reviewed systems are negative.:  Fatigue, sleepiness , snoring, fragmented sleep,   2-3 times.  How likely are you to doze in the following situations: 0 = not likely, 1 = slight chance, 2 = moderate chance, 3 = high chance   Sitting and Reading? Watching Television? Sitting inactive in a public place (theater or meeting)? As a passenger in a car for an hour without a break? Lying down in the afternoon when circumstances permit? Sitting and talking to someone? Sitting quietly after lunch without alcohol?  In a car, while stopped for a few minutes in traffic?   Total = 15/ 24 points   FSS endorsed at xxx/ 63 points.   Social History   Socioeconomic History   Marital status: Married    Spouse name: Not on file   Number of children: 0   Years of education: Not on file   Highest education level: Not on file  Occupational History   Not on file  Tobacco Use   Smoking status: Every Day    Packs/day: 1.00    Years: 33.00    Additional pack years: 0.00    Total pack years: 33.00    Types: Cigarettes    Passive exposure: Current   Smokeless tobacco: Never   Tobacco comments:    Less than 1/2 pack per day 01/22/23 PAP  Vaping Use   Vaping Use: Every day  Substance and Sexual Activity   Alcohol use: No   Drug use: No   Sexual activity: Not Currently  Other Topics Concern   Not on file  Social History Narrative   Not on file   Social Determinants  of Health   Financial Resource Strain: Low Risk  (12/05/2022)   Overall Financial Resource Strain (CARDIA)    Difficulty of Paying Living Expenses: Not hard at all  Food Insecurity: No Food Insecurity (12/05/2022)   Hunger Vital Sign    Worried About Running Out of Food in the Last Year: Never true    Ran Out of Food in the Last Year: Never true  Transportation Needs: No Transportation Needs (12/05/2022)   PRAPARE - Administrator, Civil Service (Medical): No    Lack of Transportation (Non-Medical): No  Physical Activity: Inactive (12/05/2022)   Exercise Vital Sign    Days of Exercise per Week: 0 days    Minutes of Exercise per Session: 0 min  Stress: No Stress Concern Present (12/05/2022)   [employer] of Occupational Health - Occupational Stress Questionnaire    Feeling of Stress : Only a little  Social Connections: Moderately Integrated (12/05/2022)   Social Connection and Isolation Panel [NHANES]    Frequency of Communication with Friends and Family: More than three times a week    Frequency of Social Gatherings with Friends and Family: Three times a week    Attends Religious Services: 1 to 4 times per year    Active Member of Clubs or Organizations: No    Attends Engineer, structural: Never    Marital Status: Married    Family History  Problem Relation Age of Onset   Other Father        killed in [place]   Cancer Maternal Grandfather        stomach   Stomach cancer Maternal Grandfather    Colon cancer Neg Hx    Colitis Neg Hx    Esophageal cancer Neg Hx    Rectal cancer Neg Hx     Past Medical History:  Diagnosis Date   AAA (abdominal aortic aneurysm) without rupture    3.4 cm found on mri 2/20   Acid reflux    Anemia    Anxiety    Aortic atherosclerosis    Bipolar disorder    Depression    Diverticulitis    Dyspnea    exertional   Genital warts    Hyperlipidemia    Hypertension    Hypothyroidism    Meningitis    PAD (peripheral artery  disease)    femoral  artery angioplasy/stent   SIADH (syndrome of inappropriate ADH production)    Sleep apnea 12/2015   Smoker    Smoker    Spondylosis    Testosterone deficiency     Past Surgical History:  Procedure Laterality Date   APPENDECTOMY  12/2018   at [facility]   CHOLECYSTECTOMY  2007   CLOSED REDUCTION MANDIBLE N/A 03/09/2018   Procedure: CLOSED REDUCTION MANDIBULAR MMF;  Surgeon: Osborn Coho, MD;  Location[facility];  Service: ENT;  Laterality: N/A;   COLONOSCOPY  07/26/2020   10/22/2018   DRUG INDUCED ENDOSCOPY N/A 04/06/2021   Procedure: DRUG INDUCED ENDOSCOPY;  Surgeon: Christia Reading, MD;  Location: [facility];  Service: ENT;  Laterality: N/A;   IMPLANTATION OF HYPOGLOSSAL NERVE STIMULATOR Right 07/20/2021   Procedure: IMPLANTATION OF HYPOGLOSSAL NERVE STIMULATOR;  Surgeon: Christia Reading, MD;  Location: [facility];  Service: ENT;  Laterality: Right;   KNEE SURGERY     right   LAPAROSCOPIC APPENDECTOMY N/A 07/25/2018   Procedure: APPENDECTOMY LAPAROSCOPIC;  Surgeon: Franky Macho, MD;  Location: [facility];  Service: General;  Laterality: N/A;   MANDIBULAR HARDWARE REMOVAL N/A 05/06/2018   Procedure: REMOVAL OF MMF HARDWARE;  Surgeon: Osborn Coho, MD;  Location: [facility];  Service: ENT;  Laterality: N/A;   NASAL SEPTUM SURGERY     NASAL SINUS SURGERY     POLYPECTOMY     right sfa recanalization and stenting      [facility] 12/2015   UPPER GASTROINTESTINAL ENDOSCOPY  07/26/2020   WRIST SURGERY     right and left     Current Outpatient Medications on File Prior to Visit  Medication Sig Dispense Refill   Armodafinil 250 MG tablet Take 250 mg by mouth daily.     aspirin EC 81 MG tablet Take 81 mg by mouth daily.     baclofen (LIORESAL) 10 MG tablet Take 10 mg by mouth 4 (four) times daily.     clonazePAM (KLONOPIN) 1 MG tablet Take 1 mg by mouth 2 (two) times daily.     DULoxetine (CYMBALTA) 60 MG capsule Take 60 mg by  mouth daily.     Fluticasone-Umeclidin-Vilant (TRELEGY ELLIPTA) 100-62.5-25 MCG/ACT AEPB Inhale 1 puff into the lungs daily. (Patient not taking: Reported on 02/13/2023) 1 each 0   lamoTRIgine (LAMICTAL) 200 MG tablet Take 200 mg by mouth daily.     levothyroxine (SYNTHROID) 125 MCG tablet Take 1 tablet (125 mcg total) by mouth daily before breakfast. 90 tablet 0   losartan (COZAAR) 50 MG tablet Take 1 tablet (50 mg total) by mouth daily. 90 tablet 0   metoprolol tartrate (LOPRESSOR) 25 MG tablet TAKE (1) TABLET TWICE DAILY. 180 tablet 0   morphine (MS CONTIN) 15 MG 12 hr tablet Take 15 mg by mouth 3 (three) times daily.     NARCAN 4 MG/0.1ML LIQD nasal spray kit SMARTSIG:1 Spray(s) Both Nares Once PRN     pantoprazole (PROTONIX) 40 MG tablet TAKE (1) TABLET TWICE DAILY. 180 tablet 0   pregabalin (LYRICA) 150 MG capsule Take 150 mg by mouth 2 (two) times daily.     REXULTI 1 MG TABS tablet Take 1 mg by mouth daily.     rosuvastatin (CRESTOR) 40 MG tablet Take 1 tablet (40 mg total) by mouth daily. 90 tablet 3   tamsulosin (FLOMAX) 0.4 MG CAPS capsule TAKE 1 CAPSULE DAILY. 30 capsule 0   valACYclovir (VALTREX) 500 MG tablet Take 1 tablet (500  mg total) by mouth daily. 90 tablet 0   vitamin E 1000 UNIT capsule Take 1,000 Units by mouth daily.     [DISCONTINUED] esomeprazole (NEXIUM) 40 MG capsule Take 40 mg by mouth daily before breakfast.     No current facility-administered medications on file prior to visit.    No Known Allergies   DIAGNOSTIC DATA (LABS, IMAGING, TESTING) - I reviewed patient records, labs, notes, testing and imaging myself where available.  Lab Results  Component Value Date   WBC 9.3 02/13/2023   HGB 12.2 (L) 02/13/2023   HCT 36.9 (L) 02/13/2023   MCV 85.6 02/13/2023   PLT 169 02/13/2023      Component Value Date/Time   NA 142 02/13/2023 1456   K 4.2 02/13/2023 1456   CL 101 02/13/2023 1456   CO2 36 (H) 02/13/2023 1456   GLUCOSE 87 02/13/2023 1456   BUN 8  02/13/2023 1456   CREATININE 0.97 02/13/2023 1456   CALCIUM 8.6 02/13/2023 1456   PROT 5.5 (L) 02/13/2023 1456   ALBUMIN 4.2 12/06/2018 1205   AST 10 02/13/2023 1456   ALT 4 (L) 02/13/2023 1456   ALKPHOS 78 12/06/2018 1205   BILITOT 0.6 02/13/2023 1456   GFRNONAA >60 07/14/2021 1130   GFRNONAA 65 03/31/2021 0935   GFRAA 75 03/31/2021 0935   Lab Results  Component Value Date   CHOL 110 02/13/2023   HDL 35 (L) 02/13/2023   LDLCALC 47 02/13/2023   TRIG 211 (H) 02/13/2023   CHOLHDL 3.1 02/13/2023   Lab Results  Component Value Date   HGBA1C 5.4 10/18/2015   Lab Results  Component Value Date   VITAMINB12 455 03/30/2022   Lab Results  Component Value Date   TSH 1.44 02/13/2023    PHYSICAL EXAM:  Today's Vitals   03/21/23 1329  Weight: 255 lb (115.7 kg)  Height: 5\' 11"  (1.803 m)   Body mass index is 35.57 kg/m.   Wt Readings from Last 3 Encounters:  03/21/23 255 lb (115.7 kg)  02/13/23 260 lb (117.9 kg)  01/22/23 257 lb (116.6 kg)     Ht Readings from Last 3 Encounters:  03/21/23 5\' 11"  (1.803 m)  02/13/23 6' (1.829 m)  01/22/23 6' (1.829 m)      General: The patient is awake, alert and appears not in acute distress. The patient is well groomed. Head: Normocephalic, atraumatic. Neck is supple. Mallampati 3,  neck circumference:19 inches .  Nasal airflow not fully patent.  facial hair , dentures.  Dental status: ne tooth Cardiovascular:  Regular rate and cardiac rhythm by pulse,  without distended neck veins. Respiratory: Lungs are clear to auscultation.  Skin:  With evidence of ankle edema, Trunk: The patient's posture is erect.   BMI is 35.6   NEUROLOGIC EXAM: The patient is awake and alert, oriented to place and time.   Memory subjective described as intact.  Attention span & concentration ability appears normal.  Speech is fluent, without  dysarthria, dysphonia or aphasia.  Mood and affect are appropriate.   Cranial nerves: no loss of smell or  taste reported  Pupils are equal and briskly reactive to light. Facial motor strength is symmetric and tongue and uvula move midline.  Neck ROM : rotation, tilt and flexion extension were normal for age and shoulder shrug was symmetrical.   Patient saw Dr. Vickey Huger today for an inspire follow up. As of right now no settings were changed because patient isn't consistently using the inspire. He was asked  starting today to use therapy consistently for a week and to work back up to is therapeutic level. He is also on 2 liters of oxygen at night.   Below is his information from today visit.               ASSESSMENT AND PLAN 57 y.o. year old male  here with:    1) poor compliance on Inspire device, OSA e with hypoxia, with COPD, with obesity - 09-2020 HST AHI 17/h. REM AHI 24.4 /h. HST did not show hypoxia.  He does no longer have morning headaches  2) he needed oxygen during his activation sleep study. He is using 2 liters of 02 at night, every night ( Dr Maple Hudson) .   3)  he is excessively sleepy due to pain, pain medication, continues to have nocturia. He is disabled and watches tv late into the night. Goes to bed 1.30 AM-  we spoke about consistently using the device and setting reminder notes to himself.   Slowly work up to level 3, wear your dentures at night. Check in by phone in 1 month.    I plan to follow up either personally or through our NP within 6  months.   I would like to thank Jetty Duhamel, MD and Donita Brooks, Md [address] [place],  [place] [address] for allowing me to meet with and to take care of this pleasant patient.   After spending a total time of 24 minutes face to face and additional time for physical and neurologic examination, review of laboratory studies,  personal review of imaging studies, reports and results of other testing and review of referral information / records as far as provided in visit,   Electronically signed by: Melvyn Novas, MD 03/21/2023 1:52 PM  [facility] Board certified by The [employer] of Sleep Medicine and Diplomate of the [employer] of Sleep Medicine. Board certified In Neurology through the ABPN, Fellow of the [employer] of Neu[employer]. Medical Director of [facility].

## 2023-03-21 NOTE — Telephone Encounter (Signed)
Patient saw Dr. Vickey Huger today for an inspire follow up. As of right now no settings were changed because patient isn't consistently using the inspire. He was asked starting today to use therapy consistently for a week and to work back up to is therapeutic level. He is also on 2 liters of oxygen at night.  Below is his information from today visit.

## 2023-03-22 ENCOUNTER — Encounter: Payer: Self-pay | Admitting: Physical Medicine & Rehabilitation

## 2023-03-22 ENCOUNTER — Encounter: Payer: Medicare HMO | Attending: Physical Medicine & Rehabilitation | Admitting: Physical Medicine & Rehabilitation

## 2023-03-22 VITALS — BP 138/80 | HR 79 | Ht 71.0 in | Wt 258.0 lb

## 2023-03-22 DIAGNOSIS — G629 Polyneuropathy, unspecified: Secondary | ICD-10-CM | POA: Diagnosis not present

## 2023-03-22 DIAGNOSIS — M5442 Lumbago with sciatica, left side: Secondary | ICD-10-CM | POA: Diagnosis not present

## 2023-03-22 DIAGNOSIS — G8929 Other chronic pain: Secondary | ICD-10-CM
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
## 2023-03-26 ENCOUNTER — Telehealth: Payer: Self-pay

## 2023-03-26 DIAGNOSIS — G629 Polyneuropathy, unspecified: Secondary | ICD-10-CM

## 2023-03-26 MED ORDER — QUTENZA (4 PATCH) 8 % EX KIT: 4.0000 | PACK | Freq: Once | CUTANEOUS | 0 refills | Status: AC

## 2023-03-26 NOTE — Telephone Encounter (Signed)
Qutenza sent to specialty pharmacy for pharmacy benefit coverage

## 2023-03-28 DIAGNOSIS — H269 Unspecified cataract: Secondary | ICD-10-CM | POA: Diagnosis not present

## 2023-03-28 DIAGNOSIS — Z79891 Long term (current) use of opiate analgesic: Secondary | ICD-10-CM | POA: Diagnosis not present

## 2023-03-28 DIAGNOSIS — G629 Polyneuropathy, unspecified: Secondary | ICD-10-CM | POA: Diagnosis not present

## 2023-03-28 DIAGNOSIS — E785 Hyperlipidemia, unspecified: Secondary | ICD-10-CM | POA: Diagnosis not present

## 2023-03-28 DIAGNOSIS — K219 Gastro-esophageal reflux disease without esophagitis: Secondary | ICD-10-CM | POA: Diagnosis not present

## 2023-03-28 DIAGNOSIS — M1990 Unspecified osteoarthritis, unspecified site: Secondary | ICD-10-CM | POA: Diagnosis not present

## 2023-03-28 DIAGNOSIS — I2510 Atherosclerotic heart disease of native coronary artery without angina pectoris: Secondary | ICD-10-CM | POA: Diagnosis not present

## 2023-03-28 DIAGNOSIS — F419 Anxiety disorder, unspecified: Secondary | ICD-10-CM | POA: Diagnosis not present

## 2023-03-28 DIAGNOSIS — E039 Hypothyroidism, unspecified: Secondary | ICD-10-CM | POA: Diagnosis not present

## 2023-03-28 DIAGNOSIS — N529 Male erectile dysfunction, unspecified: Secondary | ICD-10-CM | POA: Diagnosis not present

## 2023-03-28 DIAGNOSIS — I10 Essential (primary) hypertension: Secondary | ICD-10-CM | POA: Diagnosis not present

## 2023-03-28 DIAGNOSIS — M4800 Spinal stenosis, site unspecified: Secondary | ICD-10-CM | POA: Diagnosis not present

## 2023-04-02 DIAGNOSIS — G894 Chronic pain syndrome: Secondary | ICD-10-CM | POA: Diagnosis not present

## 2023-04-02 DIAGNOSIS — M47812 Spondylosis without myelopathy or radiculopathy, cervical region: Secondary | ICD-10-CM | POA: Diagnosis not present

## 2023-04-02 DIAGNOSIS — M62830 Muscle spasm of back: Secondary | ICD-10-CM | POA: Diagnosis not present

## 2023-04-02 DIAGNOSIS — M47816 Spondylosis without myelopathy or radiculopathy, lumbar region: Secondary | ICD-10-CM | POA: Diagnosis not present

## 2023-04-02 NOTE — Telephone Encounter (Signed)
Tony Campos (Key: [id number])

## 2023-04-05 DIAGNOSIS — F4312 Post-traumatic stress disorder, chronic: Secondary | ICD-10-CM | POA: Diagnosis not present

## 2023-04-05 DIAGNOSIS — G4730 Sleep apnea, unspecified: Secondary | ICD-10-CM | POA: Diagnosis not present

## 2023-04-05 DIAGNOSIS — F3181 Bipolar II disorder: Secondary | ICD-10-CM | POA: Diagnosis not present

## 2023-04-05 DIAGNOSIS — G47419 Narcolepsy without cataplexy: Secondary | ICD-10-CM | POA: Diagnosis not present

## 2023-04-06 DIAGNOSIS — J449 Chronic obstructive pulmonary disease, unspecified: Secondary | ICD-10-CM | POA: Diagnosis not present

## 2023-04-06 NOTE — Telephone Encounter (Signed)
PA approved for Non-Formulary 11/27/22-11/27/23

## 2023-04-10 DIAGNOSIS — M5416 Radiculopathy, lumbar region: Secondary | ICD-10-CM | POA: Diagnosis not present

## 2023-04-16 ENCOUNTER — Telehealth: Payer: Self-pay | Admitting: Pharmacist

## 2023-04-16 NOTE — Progress Notes (Signed)
Care Management & Coordination Services Pharmacy Team   Reason for Encounter: Medication Adherence   Contacted patients pharmacy [facility] [phone]) and spoke to the pharmacist to check on the last fill for Rosuvastatin 40 mg. Per the pharmacist last fill date was 03/08/23. Called patient LM for him to return my call to discuss adherence with Losartan.    [employer]

## 2023-04-19 ENCOUNTER — Encounter: Payer: Medicare HMO | Admitting: Physical Medicine & Rehabilitation

## 2023-05-07 DIAGNOSIS — J449 Chronic obstructive pulmonary disease, unspecified: Secondary | ICD-10-CM | POA: Diagnosis not present

## 2023-05-09 ENCOUNTER — Telehealth: Payer: Self-pay

## 2023-05-09 DIAGNOSIS — M47812 Spondylosis without myelopathy or radiculopathy, cervical region: Secondary | ICD-10-CM | POA: Diagnosis not present

## 2023-05-09 DIAGNOSIS — M62830 Muscle spasm of back: Secondary | ICD-10-CM | POA: Diagnosis not present

## 2023-05-09 DIAGNOSIS — G894 Chronic pain syndrome: Secondary | ICD-10-CM | POA: Diagnosis not present

## 2023-05-09 DIAGNOSIS — M47816 Spondylosis without myelopathy or radiculopathy, lumbar region: Secondary | ICD-10-CM | POA: Diagnosis not present

## 2023-05-09 NOTE — Progress Notes (Signed)
This patient is appearing on the insurance-provided list for being at risk of failing the adherence measure for cholesterol and hypertension (ACEi/ARB) medications this calendar year.   Medication: losartan, rosuvastatin Last fill date: 03/08/2023 for 30 day supply.   Attempted to outreach patient to discuss adherence. Unable to reach patient. DPR on file. Left message asking if patient needed refills on medications and provided clinic number to call back and request refills.   Valeda Malm, Pharm.D. PGY-2 Ambulatory Care Pharmacy Resident

## 2023-05-14 DIAGNOSIS — H5213 Myopia, bilateral: Secondary | ICD-10-CM | POA: Diagnosis not present

## 2023-05-14 DIAGNOSIS — H52223 Regular astigmatism, bilateral: Secondary | ICD-10-CM | POA: Diagnosis not present

## 2023-06-04 DIAGNOSIS — E669 Obesity, unspecified: Secondary | ICD-10-CM | POA: Diagnosis not present

## 2023-06-04 DIAGNOSIS — Z6836 Body mass index (BMI) 36.0-36.9, adult: Secondary | ICD-10-CM | POA: Diagnosis not present

## 2023-06-04 DIAGNOSIS — S90931A Unspecified superficial injury of right great toe, initial encounter: Secondary | ICD-10-CM | POA: Diagnosis not present

## 2023-06-06 DIAGNOSIS — J449 Chronic obstructive pulmonary disease, unspecified: Secondary | ICD-10-CM | POA: Diagnosis not present

## 2023-06-07 ENCOUNTER — Other Ambulatory Visit: Payer: Self-pay | Admitting: Family Medicine

## 2023-06-07 DIAGNOSIS — R000 Tachycardia, unspecified: Secondary | ICD-10-CM

## 2023-06-11 ENCOUNTER — Ambulatory Visit: Payer: Self-pay

## 2023-06-11 NOTE — Telephone Encounter (Signed)
  Chief Complaint: Neurological deficits Symptoms: Feels drunk, acts drunk, memory issues Frequency: 6 months to 1 year Pertinent Negatives: Patient denies  Disposition: [] ED /[] Urgent Care (no appt availability in office) / [x] Appointment(In office/virtual)/ []  [facility]/ [] Home Care/ [] Refused Recommended Disposition /[] [facility]/ []  Follow-up with PCP Additional Notes: Pt states that he has felt like this for 6 months to 1 year. Sometimes are very bad and other days he is fine. He also sleeps a lot. Pt started a new medication  Sunosi 150mg , prescribed by his psychiatrist. He feels this may be adding to the issue. Pt has an OV on Thursday. PT will monitor s/s and call back or go to ED if needed. Please advise.   Reason for Disposition  [1] Loss of speech or garbled speech AND [2] is a chronic symptom (recurrent or ongoing AND present > 4 weeks)  Answer Assessment - Initial Assessment Questions 1. SYMPTOM: "What is the main symptom you are concerned about?" (e.g., weakness, numbness)     Feels drunk, acts drunk, memory is bad 2. ONSET: "When did this start?" (minutes, hours, days; while sleeping)     On and off for awhile 3. LAST NORMAL: "When was the last time you (the patient) were normal (no symptoms)?"     6 months to a year 4. PATTERN "Does this come and go, or has it been constant since it started?"  "Is it present now?"     Comes and goes 5. CARDIAC SYMPTOMS: "Have you had any of the following symptoms: chest pain, difficulty breathing, palpitations?"     no 6. NEUROLOGIC SYMPTOMS: "Have you had any of the following symptoms: headache, dizziness, vision loss, double vision, changes in speech, unsteady on your feet?"     Speech,  low O2 levels 7. OTHER SYMPTOMS: "Do you have any other symptoms?"     Poor memory, feels and acts drunk. Other days, he is just fine.  Protocols used: Neurologic Deficit-A-AH

## 2023-06-14 ENCOUNTER — Ambulatory Visit: Payer: Medicare HMO | Admitting: Family Medicine

## 2023-06-18 ENCOUNTER — Ambulatory Visit (INDEPENDENT_AMBULATORY_CARE_PROVIDER_SITE_OTHER): Payer: Medicare HMO | Admitting: Family Medicine

## 2023-06-18 VITALS — BP 136/72 | HR 82 | Temp 99.1°F | Ht 71.0 in | Wt 252.0 lb

## 2023-06-18 DIAGNOSIS — G4710 Hypersomnia, unspecified: Secondary | ICD-10-CM | POA: Diagnosis not present

## 2023-06-18 DIAGNOSIS — R5383 Other fatigue: Secondary | ICD-10-CM

## 2023-06-18 LAB — CBC WITH DIFFERENTIAL/PLATELET
Absolute Monocytes: 754 cells/uL (ref 200–950)
Basophils Absolute: 37 cells/uL (ref 0–200)
Eosinophils Relative: 0 %
Hemoglobin: 13.4 g/dL (ref 13.2–17.1)
MCH: 28.3 pg (ref 27.0–33.0)
MPV: 9.8 fL (ref 7.5–12.5)
Monocytes Relative: 8.2 %
Neutro Abs: 6017 cells/uL (ref 1500–7800)
Neutrophils Relative %: 65.4 %

## 2023-06-18 MED ORDER — VARENICLINE TARTRATE (STARTER) 0.5 MG X 11 & 1 MG X 42 PO TBPK: ORAL_TABLET | ORAL | 0 refills | Status: DC

## 2023-06-18 NOTE — Progress Notes (Signed)
Subjective:    Patient ID: Tony Campos, male    DOB: [birth year], 57 y.o.   MRN: [id number] Wt Readings from Last 3 Encounters:  06/18/23 252 lb (114.3 kg)  03/22/23 258 lb (117 kg)  03/21/23 255 lb (115.7 kg)   Patient states that prior to coming to the office today, he has been feeling "drunk" for the last several days.  He states that he felt very sleepy and lethargic.  He states that he felt like he was slurring his speech.  He states that he felt like his balance was all.  He admits that he had not been eating much for the last 3 days.  He is also not wearing his oxygen at night.  After making the appointment, he started wearing his oxygen.  He also tried to start eating more.  Today he feels more awake and alert.  He is currently being treated for narcolepsy.  However he is also on MS Contin 15 mg twice daily.  He also takes Klonopin 1 mg twice daily, baclofen 10 mg 3 times daily, Lyrica 150 mg twice daily.  He is also on Lamictal and Rexulti for bipolar.  We have discussed in the past that I am concerned about polypharmacy.  Patient states that he will often just stopped eating for several days simply because he does not feel hungry.  He denies any early satiety.  Instead is just lack of appetite.  He denies any melena or hematochezia or abdominal pain or vomiting.  Past Medical History:  Diagnosis Date   AAA (abdominal aortic aneurysm) without rupture (HCC)    3.4 cm found on mri 2/20   Acid reflux    Anemia    Anxiety    Aortic atherosclerosis (HCC)    Bipolar disorder (HCC)    Depression    Diverticulitis    Dyspnea    exertional   Genital warts    Hyperlipidemia    Hypertension    Hypothyroidism    Meningitis    PAD (peripheral artery disease) (HCC)    femoral artery angioplasy/stent   SIADH (syndrome of inappropriate ADH production) (HCC)    Sleep apnea 12/2015   Smoker    Smoker    Spondylosis    Testosterone deficiency    Past Surgical History:  Procedure  Laterality Date   APPENDECTOMY  12/2018   at [facility]   CHOLECYSTECTOMY  2007   CLOSED REDUCTION MANDIBLE N/A 03/09/2018   Procedure: CLOSED REDUCTION MANDIBULAR MMF;  Surgeon: Osborn Coho, MD;  Location: [facility] OR;  Service: ENT;  Laterality: N/A;   COLONOSCOPY  07/26/2020   10/22/2018   DRUG INDUCED ENDOSCOPY N/A 04/06/2021   Procedure: DRUG INDUCED ENDOSCOPY;  Surgeon: Christia Reading, MD;  Location: [facility];  Service: ENT;  Laterality: N/A;   IMPLANTATION OF HYPOGLOSSAL NERVE STIMULATOR Right 07/20/2021   Procedure: IMPLANTATION OF HYPOGLOSSAL NERVE STIMULATOR;  Surgeon: Christia Reading, MD;  Location: [facility];  Service: ENT;  Laterality: Right;   KNEE SURGERY     right   LAPAROSCOPIC APPENDECTOMY N/A 07/25/2018   Procedure: APPENDECTOMY LAPAROSCOPIC;  Surgeon: Franky Macho, MD;  Location: [facility];  Service: General;  Laterality: N/A;   MANDIBULAR HARDWARE REMOVAL N/A 05/06/2018   Procedure: REMOVAL OF MMF HARDWARE;  Surgeon: Osborn Coho, MD;  Location: [facility];  Service: ENT;  Laterality: N/A;   NASAL SEPTUM SURGERY     NASAL SINUS SURGERY     POLYPECTOMY  right sfa recanalization and stenting      [facility] 12/2015   UPPER GASTROINTESTINAL ENDOSCOPY  07/26/2020   WRIST SURGERY     right and left   Current Outpatient Medications on File Prior to Visit  Medication Sig Dispense Refill   Armodafinil 250 MG tablet Take 250 mg by mouth daily.     aspirin EC 81 MG tablet Take 81 mg by mouth daily.     baclofen (LIORESAL) 10 MG tablet Take 10 mg by mouth 4 (four) times daily.     clonazePAM (KLONOPIN) 1 MG tablet Take 1 mg by mouth 2 (two) times daily.     DULoxetine (CYMBALTA) 60 MG capsule Take 60 mg by mouth daily.     Fluticasone-Umeclidin-Vilant (TRELEGY ELLIPTA) 100-62.5-25 MCG/ACT AEPB Inhale 1 puff into the lungs daily. 1 each 0   lamoTRIgine (LAMICTAL) 200 MG tablet Take 200 mg by mouth daily.     levothyroxine  (SYNTHROID) 125 MCG tablet take 1 tablet (125 MICROGRAM total) by mouth daily before breakfast. 90 tablet 0   losartan (COZAAR) 50 MG tablet take 1 tablet (50 MILLIGRAM total) by mouth daily. 90 tablet 0   metoprolol tartrate (LOPRESSOR) 25 MG tablet take (1) tablet twice daily. 180 tablet 0   morphine (MS CONTIN) 15 MG 12 hr tablet Take 15 mg by mouth 3 (three) times daily.     NARCAN 4 MG/0.1ML LIQD nasal spray kit SMARTSIG:1 Spray(s) Both Nares Once PRN     pantoprazole (PROTONIX) 40 MG tablet TAKE (1) TABLET TWICE DAILY. 180 tablet 0   pregabalin (LYRICA) 150 MG capsule Take 150 mg by mouth 2 (two) times daily.     REXULTI 1 MG TABS tablet Take 1 mg by mouth daily.     rosuvastatin (CRESTOR) 40 MG tablet Take 1 tablet (40 mg total) by mouth daily. 90 tablet 3   tamsulosin (FLOMAX) 0.4 MG CAPS capsule TAKE 1 CAPSULE DAILY. 30 capsule 0   valACYclovir (VALTREX) 500 MG tablet take 1 tablet (500 MILLIGRAM total) by mouth daily. 90 tablet 0   vitamin E 1000 UNIT capsule Take 1,000 Units by mouth daily.     [DISCONTINUED] esomeprazole (NEXIUM) 40 MG capsule Take 40 mg by mouth daily before breakfast.     No current facility-administered medications on file prior to visit.      No Known Allergies Social History   Socioeconomic History   Marital status: Married    Spouse name: Not on file   Number of children: 0   Years of education: Not on file   Highest education level: Associate degree: occupational, Scientist, product/process development, or vocational program  Occupational History   Not on file  Tobacco Use   Smoking status: Every Day    Current packs/day: 1.00    Average packs/day: 1 pack/day for 33.0 years (33.0 ttl pk-yrs)    Types: Cigarettes    Passive exposure: Current   Smokeless tobacco: Never   Tobacco comments:    Less than 1/2 pack per day 01/22/23 PAP  Vaping Use   Vaping status: Every Day  Substance and Sexual Activity   Alcohol use: No   Drug use: No   Sexual activity: Not Currently   Other Topics Concern   Not on file  Social History Narrative   Not on file   Social Determinants of Health   Financial Resource Strain: Low Risk  (06/18/2023)   Overall Financial Resource Strain (CARDIA)    Difficulty of Paying Living Expenses: Not hard at  all  Food Insecurity: No Food Insecurity (06/18/2023)   Hunger Vital Sign    Worried About Running Out of Food in the Last Year: Never true    Ran Out of Food in the Last Year: Never true  Transportation Needs: No Transportation Needs (06/18/2023)   PRAPARE - Administrator, Civil Service (Medical): No    Lack of Transportation (Non-Medical): No  Physical Activity: Inactive (06/18/2023)   Exercise Vital Sign    Days of Exercise per Week: 0 days    Minutes of Exercise per Session: 0 min  Stress: Stress Concern Present (06/18/2023)   [employer] of Occupational Health - Occupational Stress Questionnaire    Feeling of Stress : To some extent  Social Connections: Moderately Isolated (06/18/2023)   Social Connection and Isolation Panel [NHANES]    Frequency of Communication with Friends and Family: Once a week    Frequency of Social Gatherings with Friends and Family: Once a week    Attends Religious Services: 1 to 4 times per year    Active Member of [employer] or Organizations: No    Attends Banker Meetings: Never    Marital Status: Married  Catering manager Violence: Not At Risk (12/05/2022)   Humiliation, Afraid, Rape, and Kick questionnaire    Fear of Current or Ex-Partner: No    Emotionally Abused: No    Physically Abused: No    Sexually Abused: No      Review of Systems  All other systems reviewed and are negative.      Objective:   Physical Exam Vitals reviewed.  Constitutional:      General: He is not in acute distress.    Appearance: Normal appearance. He is not ill-appearing, toxic-appearing or diaphoretic.  HENT:     Head: Normocephalic and atraumatic.  Cardiovascular:     Rate and  Rhythm: Normal rate and regular rhythm.     Heart sounds: Normal heart sounds. No murmur heard.    No friction rub. No gallop.  Pulmonary:     Effort: Pulmonary effort is normal.     Breath sounds: No wheezing or rhonchi.  Musculoskeletal:     Right lower leg: No edema.     Left lower leg: No edema.       Feet:  Neurological:     General: No focal deficit present.     Mental Status: He is alert and oriented to person, place, and time.         Assessment & Plan:  Fatigue, unspecified type - Plan: COMPLETE METABOLIC PANEL WITH GFR, CBC with Differential/Platelet, TSH  Hypersomnolence While the patient does have narcolepsy, I am concerned that the majority of his fatigue and hypersomnolence is related to polypharmacy.  I believe that this is likely only exacerbated when he stops wearing his oxygen and does not properly.  Therefore I reviewed his medication list with him and explained the interactions of all the medications.  I have recommended that he discontinue baclofen altogether.  Like him to reduce Klonopin to 0.5 mg twice a day in an effort to try to wean down.  I think simply doing this will help.  Patient does not feel that he can stop the Lyrica as this gives him the most pain relief from the nerve pain.  He has reduced MS Contin from 3 times a day to twice a day.  The patient injured his right great toe on July 2.  He struck the toe against a  step.  The toe is swollen distal to the first MTP joint.  He went to an urgent care at an outside facility and had x-rays earlier this month that were negative for fracture.  He has been wearing crocs and bedroom shoes ever since.  He reports pain and swelling in the MTP joint and distal to that.  There is no palpable deformity but there is some visible swelling.  I recommended that he wear a postop shoe when weightbearing for the next 2 weeks.  I do not believe that his footwear has provided adequate support and this is likely aggravating the  injury.  Reassess in 2 weeks if no better or sooner if worse

## 2023-06-19 LAB — TSH: TSH: 2.65 mIU/L (ref 0.40–4.50)

## 2023-06-19 LAB — COMPLETE METABOLIC PANEL WITH GFR
AG Ratio: 2.1 (calc) (ref 1.0–2.5)
ALT: 4 U/L — ABNORMAL LOW (ref 9–46)
AST: 9 U/L — ABNORMAL LOW (ref 10–35)
Albumin: 3.9 g/dL (ref 3.6–5.1)
Alkaline phosphatase (APISO): 83 U/L (ref 35–144)
BUN: 11 mg/dL (ref 7–25)
CO2: 35 mmol/L — ABNORMAL HIGH (ref 20–32)
Calcium: 9.4 mg/dL (ref 8.6–10.3)
Chloride: 97 mmol/L — ABNORMAL LOW (ref 98–110)
Creat: 0.85 mg/dL (ref 0.70–1.30)
Globulin: 1.9 g/dL (calc) (ref 1.9–3.7)
Glucose, Bld: 84 mg/dL (ref 65–99)
Potassium: 4 mmol/L (ref 3.5–5.3)
Sodium: 141 mmol/L (ref 135–146)
Total Bilirubin: 0.4 mg/dL (ref 0.2–1.2)
Total Protein: 5.8 g/dL — ABNORMAL LOW (ref 6.1–8.1)
eGFR: 103 mL/min/{1.73_m2} (ref >=60)

## 2023-06-19 LAB — CBC WITH DIFFERENTIAL/PLATELET
Basophils Relative: 0.4 %
Eosinophils Absolute: 0 cells/uL — ABNORMAL LOW (ref 15–500)
HCT: 40.6 % (ref 38.5–50.0)
Lymphs Abs: 2392 cells/uL (ref 850–3900)
MCHC: 33 g/dL (ref 32.0–36.0)
MCV: 85.7 fL (ref 80.0–100.0)
Platelets: 225 10*3/uL (ref 140–400)
RBC: 4.74 10*6/uL (ref 4.20–5.80)
RDW: 16 % — ABNORMAL HIGH (ref 11.0–15.0)
Total Lymphocyte: 26 %
WBC: 9.2 10*3/uL (ref 3.8–10.8)

## 2023-06-25 DIAGNOSIS — M62830 Muscle spasm of back: Secondary | ICD-10-CM | POA: Diagnosis not present

## 2023-06-25 DIAGNOSIS — Z79891 Long term (current) use of opiate analgesic: Secondary | ICD-10-CM | POA: Diagnosis not present

## 2023-06-25 DIAGNOSIS — M47816 Spondylosis without myelopathy or radiculopathy, lumbar region: Secondary | ICD-10-CM | POA: Diagnosis not present

## 2023-06-25 DIAGNOSIS — G894 Chronic pain syndrome: Secondary | ICD-10-CM | POA: Diagnosis not present

## 2023-06-25 DIAGNOSIS — M47812 Spondylosis without myelopathy or radiculopathy, cervical region: Secondary | ICD-10-CM | POA: Diagnosis not present

## 2023-07-07 DIAGNOSIS — J449 Chronic obstructive pulmonary disease, unspecified: Secondary | ICD-10-CM | POA: Diagnosis not present

## 2023-07-11 DIAGNOSIS — F4312 Post-traumatic stress disorder, chronic: Secondary | ICD-10-CM | POA: Diagnosis not present

## 2023-07-11 DIAGNOSIS — G4730 Sleep apnea, unspecified: Secondary | ICD-10-CM | POA: Diagnosis not present

## 2023-07-11 DIAGNOSIS — G47419 Narcolepsy without cataplexy: Secondary | ICD-10-CM | POA: Diagnosis not present

## 2023-07-11 DIAGNOSIS — F3181 Bipolar II disorder: Secondary | ICD-10-CM | POA: Diagnosis not present

## 2023-07-12 ENCOUNTER — Ambulatory Visit (INDEPENDENT_AMBULATORY_CARE_PROVIDER_SITE_OTHER): Payer: Medicare HMO | Admitting: Family Medicine

## 2023-07-12 VITALS — BP 126/74 | HR 62 | Temp 98.4°F | Ht 71.0 in | Wt 252.0 lb

## 2023-07-12 DIAGNOSIS — M84377G Stress fracture, right toe(s), subsequent encounter for fracture with delayed healing: Secondary | ICD-10-CM | POA: Diagnosis not present

## 2023-07-12 DIAGNOSIS — M79674 Pain in right toe(s): Secondary | ICD-10-CM | POA: Diagnosis not present

## 2023-07-12 NOTE — Progress Notes (Signed)
Subjective:    Patient ID: Tony Campos, male    DOB: Apr 21, 1966, 57 y.o.   MRN: [id number]  About 7 weeks ago, the patient accidentally kicked his right great toe against a step.  Felt severe pain.  Went to another facility where he states he had x-rays of the toe to rule out any fracture.  He presents today complaining of persistent pain in his right great toe.  The toe was visibly discolored compared to the left toe.  Slightly erythematous.  Slightly warm.  He reports of burning pain on the plantar surface of the left first MTP joint as well as in the PIP joint.  When I squeeze the proximal phalanx gently, the patient jumps in pain and moans.  He states it hurts to bear weight on his toe.  He has been wearing a cam walker for the last 4 weeks at my suggestion.  I was treating the patient for a possible stress fracture.  However, the patient's pain is worsening Past Medical History:  Diagnosis Date   AAA (abdominal aortic aneurysm) without rupture (HCC)    3.4 cm found on mri 2/20   Acid reflux    Anemia    Anxiety    Aortic atherosclerosis (HCC)    Bipolar disorder (HCC)    Depression    Diverticulitis    Dyspnea    exertional   Genital warts    Hyperlipidemia    Hypertension    Hypothyroidism    Meningitis    PAD (peripheral artery disease) (HCC)    femoral artery angioplasy/stent   SIADH (syndrome of inappropriate ADH production) (HCC)    Sleep apnea 12/2015   Smoker    Smoker    Spondylosis    Testosterone deficiency    Past Surgical History:  Procedure Laterality Date   APPENDECTOMY  12/2018   at [facility]   CHOLECYSTECTOMY  2007   CLOSED REDUCTION MANDIBLE N/A 03/09/2018   Procedure: CLOSED REDUCTION MANDIBULAR MMF;  Surgeon: Osborn Coho, MD;  Location: [facility] OR;  Service: ENT;  Laterality: N/A;   COLONOSCOPY  07/26/2020   10/22/2018   DRUG INDUCED ENDOSCOPY N/A 04/06/2021   Procedure: DRUG INDUCED ENDOSCOPY;  Surgeon: Christia Reading, MD;  Location: [facility];  Service: ENT;  Laterality: N/A;   IMPLANTATION OF HYPOGLOSSAL NERVE STIMULATOR Right 07/20/2021   Procedure: IMPLANTATION OF HYPOGLOSSAL NERVE STIMULATOR;  Surgeon: Christia Reading, MD;  Location: [facility];  Service: ENT;  Laterality: Right;   KNEE SURGERY     right   LAPAROSCOPIC APPENDECTOMY N/A 07/25/2018   Procedure: APPENDECTOMY LAPAROSCOPIC;  Surgeon: Franky Macho, MD;  Location: [facility];  Service: General;  Laterality: N/A;   MANDIBULAR HARDWARE REMOVAL N/A 05/06/2018   Procedure: REMOVAL OF MMF HARDWARE;  Surgeon: Osborn Coho, MD;  Location: [facility];  Service: ENT;  Laterality: N/A;   NASAL SEPTUM SURGERY     NASAL SINUS SURGERY     POLYPECTOMY     right sfa recanalization and stenting      [facility] 12/2015   UPPER GASTROINTESTINAL ENDOSCOPY  07/26/2020   WRIST SURGERY     right and left   Current Outpatient Medications on File Prior to Visit  Medication Sig Dispense Refill   Armodafinil 250 MG tablet Take 250 mg by mouth daily.     aspirin EC 81 MG tablet Take 81 mg by mouth daily.     baclofen (LIORESAL) 10 MG tablet Take 10  mg by mouth 4 (four) times daily.     clonazePAM (KLONOPIN) 1 MG tablet Take 1 mg by mouth 2 (two) times daily.     DULoxetine (CYMBALTA) 60 MG capsule Take 60 mg by mouth daily.     Fluticasone-Umeclidin-Vilant (TRELEGY ELLIPTA) 100-62.5-25 MCG/ACT AEPB Inhale 1 puff into the lungs daily. 1 each 0   lamoTRIgine (LAMICTAL) 200 MG tablet Take 200 mg by mouth daily.     levothyroxine (SYNTHROID) 125 MCG tablet take 1 tablet (125 MICROGRAM total) by mouth daily before breakfast. 90 tablet 0   losartan (COZAAR) 50 MG tablet take 1 tablet (50 MILLIGRAM total) by mouth daily. 90 tablet 0   metoprolol tartrate (LOPRESSOR) 25 MG tablet take (1) tablet twice daily. 180 tablet 0   morphine (MS CONTIN) 15 MG 12 hr tablet Take 15 mg by mouth 3 (three) times daily.     NARCAN 4 MG/0.1ML LIQD nasal spray kit SMARTSIG:1  Spray(s) Both Nares Once PRN     pantoprazole (PROTONIX) 40 MG tablet TAKE (1) TABLET TWICE DAILY. 180 tablet 0   pregabalin (LYRICA) 150 MG capsule Take 150 mg by mouth 2 (two) times daily.     REXULTI 1 MG TABS tablet Take 1 mg by mouth daily.     rosuvastatin (CRESTOR) 40 MG tablet Take 1 tablet (40 mg total) by mouth daily. 90 tablet 3   tamsulosin (FLOMAX) 0.4 MG CAPS capsule TAKE 1 CAPSULE DAILY. 30 capsule 0   valACYclovir (VALTREX) 500 MG tablet take 1 tablet (500 MILLIGRAM total) by mouth daily. 90 tablet 0   Varenicline Tartrate, Starter, (CHANTIX STARTING MONTH PAK) 0.5 MG X 11 & 1 MG X 42 TBPK Take 0.5 mg tablet by mouth 1x daily for 3 days, then increase to 0.5 mg tablet 2x daily for 4 days, then increase to 1 mg tablet 2x daily. 1 each 0   vitamin E 1000 UNIT capsule Take 1,000 Units by mouth daily.     [DISCONTINUED] esomeprazole (NEXIUM) 40 MG capsule Take 40 mg by mouth daily before breakfast.     No current facility-administered medications on file prior to visit.      No Known Allergies Social History   Socioeconomic History   Marital status: Married    Spouse name: Not on file   Number of children: 0   Years of education: Not on file   Highest education level: Associate degree: occupational, Scientist, product/process development, or vocational program  Occupational History   Not on file  Tobacco Use   Smoking status: Every Day    Current packs/day: 1.00    Average packs/day: 1 pack/day for 33.0 years (33.0 ttl pk-yrs)    Types: Cigarettes    Passive exposure: Current   Smokeless tobacco: Never   Tobacco comments:    Less than 1/2 pack per day 01/22/23 PAP  Vaping Use   Vaping status: Every Day  Substance and Sexual Activity   Alcohol use: No   Drug use: No   Sexual activity: Not Currently  Other Topics Concern   Not on file  Social History Narrative   Not on file   Social Determinants of Health   Financial Resource Strain: Low Risk  (06/18/2023)   Overall Financial Resource  Strain (CARDIA)    Difficulty of Paying Living Expenses: Not hard at all  Food Insecurity: No Food Insecurity (06/18/2023)   Hunger Vital Sign    Worried About Running Out of Food in the Last Year: Never true  Ran Out of Food in the Last Year: Never true  Transportation Needs: No Transportation Needs (06/18/2023)   PRAPARE - Administrator, Civil Service (Medical): No    Lack of Transportation (Non-Medical): No  Physical Activity: Inactive (06/18/2023)   Exercise Vital Sign    Days of Exercise per Week: 0 days    Minutes of Exercise per Session: 0 min  Stress: Stress Concern Present (06/18/2023)   [employer] of Occupational Health - Occupational Stress Questionnaire    Feeling of Stress : To some extent  Social Connections: Moderately Isolated (06/18/2023)   Social Connection and Isolation Panel [NHANES]    Frequency of Communication with Friends and Family: Once a week    Frequency of Social Gatherings with Friends and Family: Once a week    Attends Religious Services: 1 to 4 times per year    Active Member of [employer] or Organizations: No    Attends Banker Meetings: Never    Marital Status: Married  Catering manager Violence: Not At Risk (12/05/2022)   Humiliation, Afraid, Rape, and Kick questionnaire    Fear of Current or Ex-Partner: No    Emotionally Abused: No    Physically Abused: No    Sexually Abused: No      Review of Systems  All other systems reviewed and are negative.      Objective:   Physical Exam Vitals reviewed.  Constitutional:      General: He is not in acute distress.    Appearance: Normal appearance. He is not ill-appearing, toxic-appearing or diaphoretic.  HENT:     Head: Normocephalic and atraumatic.  Cardiovascular:     Rate and Rhythm: Normal rate and regular rhythm.     Heart sounds: Normal heart sounds. No murmur heard.    No friction rub. No gallop.  Pulmonary:     Effort: Pulmonary effort is normal.     Breath  sounds: No wheezing or rhonchi.  Musculoskeletal:     Right lower leg: No edema.     Left lower leg: No edema.     Right foot: Decreased range of motion. Normal capillary refill. Swelling, deformity, tenderness and bony tenderness present.       Feet:  Neurological:     General: No focal deficit present.     Mental Status: He is alert and oriented to person, place, and time.         Assessment & Plan:  Great toe pain, right  Stress fracture of phalanx of toe of right foot with delayed healing, subsequent encounter Based on his exam, I am concerned the patient may have a stress fracture or a nonunion.  He is 7 weeks out from the initial injury and has been wearing a cam walker every day.  However the pain and swelling in his right great toe distal to the MTP joint is not improving.  He has pain with ambulation.  Pain with weightbearing.  Therefore I believe we need to get an MRI of the foot to determine if he has a nonhealing fracture.  I also want to rule out osteomyelitis given the warmth and swelling and pain that is persistent now for more than 7 weeks

## 2023-07-13 ENCOUNTER — Telehealth: Payer: Self-pay

## 2023-07-13 NOTE — Telephone Encounter (Signed)
Hi Linda,   This patient called stating that he needs his MRI scheduled at  a [facility] and an open MRI due to having the Inspire sleep apnea device.  Pt states that he was called by [facility] to schedule but they told him they couldn't do it there because he has the Startup.  Just let me know what I need to do on my end. Thank you!  Germaine Pomfret

## 2023-07-16 ENCOUNTER — Other Ambulatory Visit: Payer: Self-pay | Admitting: Family Medicine

## 2023-07-16 DIAGNOSIS — M84377G Stress fracture, right toe(s), subsequent encounter for fracture with delayed healing: Secondary | ICD-10-CM

## 2023-07-16 DIAGNOSIS — M79674 Pain in right toe(s): Secondary | ICD-10-CM

## 2023-07-17 ENCOUNTER — Telehealth: Payer: Self-pay | Admitting: Family Medicine

## 2023-07-17 ENCOUNTER — Other Ambulatory Visit: Payer: Self-pay | Admitting: Family Medicine

## 2023-07-17 MED ORDER — VARENICLINE TARTRATE 1 MG PO TABS: 1.0000 mg | ORAL_TABLET | Freq: Two times a day (BID) | ORAL | 3 refills | Status: DC

## 2023-07-17 NOTE — Telephone Encounter (Signed)
Patient was on a starter dose of Varenicline Tartrate, Starter, (CHANTIX STARTING MONTH PAK) 0.5 MG X 11 & 1 MG X 42 TBPK; called to request next dose increase with refills.  Pharmacy confirmed as   [facility] [place], [place] - [address] [place], [place] [address] Phone: [phone]  Fax: [phone]   Please advise at [phone].

## 2023-07-18 NOTE — Addendum Note (Signed)
Addended by: Venia Carbon K on: 07/18/2023 11:25 AM   Modules accepted: Orders

## 2023-07-19 ENCOUNTER — Ambulatory Visit (HOSPITAL_COMMUNITY): Payer: Medicare HMO

## 2023-07-23 ENCOUNTER — Ambulatory Visit: Payer: Medicare HMO | Admitting: Internal Medicine

## 2023-07-23 ENCOUNTER — Other Ambulatory Visit: Payer: Self-pay

## 2023-07-23 DIAGNOSIS — A630 Anogenital (venereal) warts: Secondary | ICD-10-CM | POA: Diagnosis not present

## 2023-07-23 DIAGNOSIS — D225 Melanocytic nevi of trunk: Secondary | ICD-10-CM | POA: Diagnosis not present

## 2023-07-23 DIAGNOSIS — D485 Neoplasm of uncertain behavior of skin: Secondary | ICD-10-CM | POA: Diagnosis not present

## 2023-07-23 DIAGNOSIS — M84377G Stress fracture, right toe(s), subsequent encounter for fracture with delayed healing: Secondary | ICD-10-CM

## 2023-07-23 DIAGNOSIS — Z85828 Personal history of other malignant neoplasm of skin: Secondary | ICD-10-CM | POA: Diagnosis not present

## 2023-07-24 DIAGNOSIS — M47812 Spondylosis without myelopathy or radiculopathy, cervical region: Secondary | ICD-10-CM | POA: Diagnosis not present

## 2023-07-24 DIAGNOSIS — M47816 Spondylosis without myelopathy or radiculopathy, lumbar region: Secondary | ICD-10-CM | POA: Diagnosis not present

## 2023-07-24 DIAGNOSIS — G894 Chronic pain syndrome: Secondary | ICD-10-CM | POA: Diagnosis not present

## 2023-07-24 DIAGNOSIS — M62830 Muscle spasm of back: Secondary | ICD-10-CM | POA: Diagnosis not present

## 2023-08-01 ENCOUNTER — Encounter: Payer: Self-pay | Admitting: Orthopaedic Surgery

## 2023-08-01 ENCOUNTER — Ambulatory Visit (INDEPENDENT_AMBULATORY_CARE_PROVIDER_SITE_OTHER): Payer: Medicare HMO | Admitting: Orthopaedic Surgery

## 2023-08-01 DIAGNOSIS — M79671 Pain in right foot: Secondary | ICD-10-CM | POA: Diagnosis not present

## 2023-08-01 DIAGNOSIS — S92414A Nondisplaced fracture of proximal phalanx of right great toe, initial encounter for closed fracture: Secondary | ICD-10-CM
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
## 2023-08-02 DIAGNOSIS — L821 Other seborrheic keratosis: Secondary | ICD-10-CM | POA: Diagnosis not present

## 2023-08-02 DIAGNOSIS — A630 Anogenital (venereal) warts: Secondary | ICD-10-CM | POA: Diagnosis not present

## 2023-08-02 DIAGNOSIS — D485 Neoplasm of uncertain behavior of skin: Secondary | ICD-10-CM | POA: Diagnosis not present

## 2023-08-07 ENCOUNTER — Other Ambulatory Visit: Payer: Self-pay | Admitting: Family Medicine

## 2023-08-07 DIAGNOSIS — J449 Chronic obstructive pulmonary disease, unspecified: Secondary | ICD-10-CM | POA: Diagnosis not present

## 2023-08-08 NOTE — Telephone Encounter (Signed)
Requested Prescriptions  Pending Prescriptions Disp Refills   pantoprazole (PROTONIX) 40 MG tablet [Pharmacy Med Name: PANTOPRAZOLE SOD DR 40 MG TAB] 60 tablet 0    Sig: take (1) tablet twice daily.     Gastroenterology: Proton Pump Inhibitors Failed - 08/07/2023  1:19 PM      Failed - Valid encounter within last 12 months    Recent Outpatient Visits           1 year ago Pneumonia of right lower lobe due to infectious organism   [facility] Pickard, Priscille Heidelberg, MD   1 year ago Anemia, unspecified type   [facility] Tanya Nones Priscille Heidelberg, MD   1 year ago Syncope, unspecified syncope type   [facility] Pickard, Priscille Heidelberg, MD   1 year ago COPD exacerbation [facility])   [facility] Donita Brooks, MD   1 year ago SOB (shortness of breath)   [facility] Pickard, Priscille Heidelberg, MD

## 2023-08-10 DIAGNOSIS — Z6834 Body mass index (BMI) 34.0-34.9, adult: Secondary | ICD-10-CM | POA: Diagnosis not present

## 2023-08-10 DIAGNOSIS — J441 Chronic obstructive pulmonary disease with (acute) exacerbation: Secondary | ICD-10-CM | POA: Diagnosis not present

## 2023-08-10 DIAGNOSIS — E669 Obesity, unspecified: Secondary | ICD-10-CM | POA: Diagnosis not present

## 2023-08-10 DIAGNOSIS — R030 Elevated blood-pressure reading, without diagnosis of hypertension: Secondary | ICD-10-CM | POA: Diagnosis not present

## 2023-08-13 DIAGNOSIS — L0100 Impetigo, unspecified: Secondary | ICD-10-CM | POA: Diagnosis not present

## 2023-08-13 DIAGNOSIS — L309 Dermatitis, unspecified: Secondary | ICD-10-CM | POA: Diagnosis not present

## 2023-08-19 DIAGNOSIS — U071 COVID-19: Secondary | ICD-10-CM | POA: Diagnosis not present

## 2023-08-19 DIAGNOSIS — J069 Acute upper respiratory infection, unspecified: Secondary | ICD-10-CM | POA: Diagnosis not present

## 2023-08-19 DIAGNOSIS — E669 Obesity, unspecified: Secondary | ICD-10-CM | POA: Diagnosis not present

## 2023-08-19 DIAGNOSIS — J019 Acute sinusitis, unspecified: Secondary | ICD-10-CM | POA: Diagnosis not present

## 2023-08-19 DIAGNOSIS — Z6834 Body mass index (BMI) 34.0-34.9, adult: Secondary | ICD-10-CM | POA: Diagnosis not present

## 2023-08-19 DIAGNOSIS — J101 Influenza due to other identified influenza virus with other respiratory manifestations: Secondary | ICD-10-CM | POA: Diagnosis not present

## 2023-08-19 DIAGNOSIS — R030 Elevated blood-pressure reading, without diagnosis of hypertension: Secondary | ICD-10-CM | POA: Diagnosis not present

## 2023-08-31 ENCOUNTER — Other Ambulatory Visit: Payer: Self-pay | Admitting: Family Medicine

## 2023-09-03 NOTE — Telephone Encounter (Signed)
Requested medications are due for refill today.  yes  Requested medications are on the active medications list.  yes  Last refill. 08/08/2023 #60 0 rf  Future visit scheduled.   no  Notes to clinic.  Pt given courtesy refill. Pt needs a CPE.    Requested Prescriptions  Pending Prescriptions Disp Refills   pantoprazole (PROTONIX) 40 MG tablet [Pharmacy Med Name: PANTOPRAZOLE SOD DR 40 MG TAB] 60 tablet 0    Sig: take (1) tablet twice daily.     Gastroenterology: Proton Pump Inhibitors Failed - 08/31/2023  4:50 PM      Failed - Valid encounter within last 12 months    Recent Outpatient Visits           1 year ago Pneumonia of right lower lobe due to infectious organism   [facility] Pickard, Priscille Heidelberg, MD   1 year ago Anemia, unspecified type   [facility] Tanya Nones Priscille Heidelberg, MD   1 year ago Syncope, unspecified syncope type   [facility] Donita Brooks, MD   2 years ago COPD exacerbation [facility])   [facility] Donita Brooks, MD   2 years ago SOB (shortness of breath)   [facility] Pickard, Priscille Heidelberg, MD

## 2023-09-06 ENCOUNTER — Ambulatory Visit: Payer: Medicare HMO | Admitting: Orthopaedic Surgery

## 2023-09-06 ENCOUNTER — Encounter: Payer: Self-pay | Admitting: Orthopaedic Surgery

## 2023-09-06 DIAGNOSIS — S92414D Nondisplaced fracture of proximal phalanx of right great toe, subsequent encounter for fracture with routine healing: Secondary | ICD-10-CM | POA: Diagnosis not present

## 2023-09-06 DIAGNOSIS — J449 Chronic obstructive pulmonary disease, unspecified: Secondary | ICD-10-CM | POA: Diagnosis not present

## 2023-09-06 DIAGNOSIS — S92919A Unspecified fracture of unspecified toe(s), initial encounter for closed fracture: Secondary | ICD-10-CM | POA: Insufficient documentation
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
## 2023-09-11 DIAGNOSIS — M47812 Spondylosis without myelopathy or radiculopathy, cervical region: Secondary | ICD-10-CM | POA: Diagnosis not present

## 2023-09-11 DIAGNOSIS — M62830 Muscle spasm of back: Secondary | ICD-10-CM | POA: Diagnosis not present

## 2023-09-11 DIAGNOSIS — M47816 Spondylosis without myelopathy or radiculopathy, lumbar region: Secondary | ICD-10-CM | POA: Diagnosis not present

## 2023-09-11 DIAGNOSIS — G894 Chronic pain syndrome: Secondary | ICD-10-CM | POA: Diagnosis not present

## 2023-09-12 ENCOUNTER — Other Ambulatory Visit: Payer: Self-pay | Admitting: Family Medicine

## 2023-09-12 DIAGNOSIS — R000 Tachycardia, unspecified: Secondary | ICD-10-CM

## 2023-09-12 NOTE — Telephone Encounter (Signed)
Requested Prescriptions  Pending Prescriptions Disp Refills   valACYclovir (VALTREX) 500 MG tablet [Pharmacy Med Name: VALACYCLOVIR HCL 500 MG TABLET] 90 tablet 0    Sig: take 1 tablet (500 milligram total) by mouth daily.     Antimicrobials:  Antiviral Agents - Anti-Herpetic Failed - 09/12/2023  3:26 PM      Failed - Valid encounter within last 12 months    Recent Outpatient Visits           1 year ago Pneumonia of right lower lobe due to infectious organism   [facility] Pickard, Priscille Heidelberg, MD   1 year ago Anemia, unspecified type   [facility] Pickard, Priscille Heidelberg, MD   1 year ago Syncope, unspecified syncope type   [facility] Donita Brooks, MD   2 years ago COPD exacerbation (HCC)   [facility] Donita Brooks, MD   2 years ago SOB (shortness of breath)   [facility] Pickard, Priscille Heidelberg, MD               levothyroxine (SYNTHROID) 125 MCG tablet [Pharmacy Med Name: LEVOTHYROXINE 125 MCG TABLET] 90 tablet 0    Sig: take 1 tablet (125 microgram total) by mouth daily before breakfast.     Endocrinology:  Hypothyroid Agents Failed - 09/12/2023  3:26 PM      Failed - Valid encounter within last 12 months    Recent Outpatient Visits           1 year ago Pneumonia of right lower lobe due to infectious organism   [facility] Pickard, Priscille Heidelberg, MD   1 year ago Anemia, unspecified type   [facility] Pickard, Priscille Heidelberg, MD   1 year ago Syncope, unspecified syncope type   [facility] Donita Brooks, MD   2 years ago COPD exacerbation (HCC)   [facility] Donita Brooks, MD   2 years ago SOB (shortness of breath)   [facility] Pickard, Priscille Heidelberg, MD              Passed - TSH in normal range and within 360 days    TSH  Date Value Ref Range Status  06/18/2023 2.65 0.40 - 4.50 mIU/L Final           metoprolol tartrate (LOPRESSOR) 25 MG tablet [Pharmacy Med Name: METOPROLOL TARTRATE 25 MG TAB] 180 tablet 0    Sig: take (1) tablet twice daily.     Cardiovascular:  Beta Blockers Failed - 09/12/2023  3:26 PM      Failed - Valid encounter within last 6 months    Recent Outpatient Visits           1 year ago Pneumonia of right lower lobe due to infectious organism   [facility] Pickard, Priscille Heidelberg, MD   1 year ago Anemia, unspecified type   [facility] Tanya Nones Priscille Heidelberg, MD   1 year ago Syncope, unspecified syncope type   [facility] Donita Brooks, MD   2 years ago COPD exacerbation [facility])   [facility] Donita Brooks, MD   2 years ago SOB (shortness of breath)   [facility] Pickard, Priscille Heidelberg, MD              Passed - Last BP in normal range  BP Readings from Last 1 Encounters:  07/12/23 126/74         Passed - Last Heart Rate in normal range    Pulse Readings from Last 1 Encounters:  07/12/23 62          losartan (COZAAR) 50 MG tablet [Pharmacy Med Name: LOSARTAN POTASSIUM 50 MG TAB] 90 tablet 0    Sig: take 1 tablet (50 milligram total) by mouth daily.     Cardiovascular:  Angiotensin Receptor Blockers Failed - 09/12/2023  3:26 PM      Failed - Valid encounter within last 6 months    Recent Outpatient Visits           1 year ago Pneumonia of right lower lobe due to infectious organism   [facility] Pickard, Priscille Heidelberg, MD   1 year ago Anemia, unspecified type   [facility] Pickard, Priscille Heidelberg, MD   1 year ago Syncope, unspecified syncope type   [facility] Donita Brooks, MD   2 years ago COPD exacerbation [facility])   [facility] Donita Brooks, MD   2 years ago SOB (shortness of breath)   [facility] Pickard, Priscille Heidelberg, MD              Passed - Cr in normal range and within 180 days     Creat  Date Value Ref Range Status  06/18/2023 0.85 0.70 - 1.30 mg/dL Final         Passed - K in normal range and within 180 days    Potassium  Date Value Ref Range Status  06/18/2023 4.0 3.5 - 5.3 mmol/L Final         Passed - Patient is not pregnant      Passed - Last BP in normal range    BP Readings from Last 1 Encounters:  07/12/23 126/74
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
## 2023-09-17 ENCOUNTER — Ambulatory Visit: Payer: Medicare HMO | Admitting: Internal Medicine

## 2023-09-25 ENCOUNTER — Other Ambulatory Visit: Payer: Self-pay | Admitting: Family Medicine

## 2023-09-28 ENCOUNTER — Ambulatory Visit (INDEPENDENT_AMBULATORY_CARE_PROVIDER_SITE_OTHER): Payer: Medicare HMO | Admitting: Family Medicine

## 2023-09-28 ENCOUNTER — Encounter: Payer: Self-pay | Admitting: Family Medicine

## 2023-09-28 VITALS — BP 124/72 | HR 72 | Temp 98.2°F | Ht 71.0 in | Wt 254.0 lb

## 2023-09-28 DIAGNOSIS — Z1211 Encounter for screening for malignant neoplasm of colon: Secondary | ICD-10-CM

## 2023-09-28 DIAGNOSIS — I2510 Atherosclerotic heart disease of native coronary artery without angina pectoris: Secondary | ICD-10-CM | POA: Diagnosis not present

## 2023-09-28 DIAGNOSIS — Z125 Encounter for screening for malignant neoplasm of prostate: Secondary | ICD-10-CM | POA: Diagnosis not present

## 2023-09-28 DIAGNOSIS — Z122 Encounter for screening for malignant neoplasm of respiratory organs: Secondary | ICD-10-CM | POA: Diagnosis not present

## 2023-09-28 MED ORDER — PANTOPRAZOLE SODIUM 40 MG PO TBEC: DELAYED_RELEASE_TABLET | ORAL | 11 refills | Status: DC

## 2023-09-28 MED ORDER — PREDNISONE 20 MG PO TABS: ORAL_TABLET | ORAL | 0 refills | Status: DC

## 2023-09-28 NOTE — Progress Notes (Signed)
Subjective:    Patient ID: Tony Campos, male    DOB: [birth year], 57 y.o.   MRN: [id number]  Patient is here today for regular checkup.  He would like a refill on his pantoprazole.  He is currently taking 40 mg twice daily.  He only takes it once daily has been breakthrough acid reflux at night coming up into his chest when he lies down.  He is due for a flu shot.  He is also due for a colonoscopy.  His last colonoscopy was in 2021 and due to the presence of polyps, his gastroenterologist recommended a repeat colonoscopy in 3 years.  He is overdue prostate cancer.  He is also due for lung cancer screening due to his history of smoking.  He is due for a flu shot.  However he reports left-sided low back pain with lumbar radiculopathy radiating down his left leg.  He is requesting a prednisone taper pack due to the severity of the pain. Past Medical History:  Diagnosis Date   AAA (abdominal aortic aneurysm) without rupture (HCC)    3.4 cm found on mri 2/20   Acid reflux    Anemia    Anxiety    Aortic atherosclerosis (HCC)    Bipolar disorder (HCC)    Depression    Diverticulitis    Dyspnea    exertional   Genital warts    Hyperlipidemia    Hypertension    Hypothyroidism    Meningitis    PAD (peripheral artery disease) (HCC)    femoral artery angioplasy/stent   SIADH (syndrome of inappropriate ADH production) (HCC)    Sleep apnea 12/2015   Smoker    Smoker    Spondylosis    Testosterone deficiency    Past Surgical History:  Procedure Laterality Date   APPENDECTOMY  12/2018   at [facility]   CHOLECYSTECTOMY  2007   CLOSED REDUCTION MANDIBLE N/A 03/09/2018   Procedure: CLOSED REDUCTION MANDIBULAR MMF;  Surgeon: Osborn Coho, MD;  Location: [facility] OR;  Service: ENT;  Laterality: N/A;   COLONOSCOPY  07/26/2020   10/22/2018   DRUG INDUCED ENDOSCOPY N/A 04/06/2021   Procedure: DRUG INDUCED ENDOSCOPY;  Surgeon: Christia Reading, MD;  Location: [facility];  Service: ENT;   Laterality: N/A;   IMPLANTATION OF HYPOGLOSSAL NERVE STIMULATOR Right 07/20/2021   Procedure: IMPLANTATION OF HYPOGLOSSAL NERVE STIMULATOR;  Surgeon: Christia Reading, MD;  Location: [facility];  Service: ENT;  Laterality: Right;   KNEE SURGERY     right   LAPAROSCOPIC APPENDECTOMY N/A 07/25/2018   Procedure: APPENDECTOMY LAPAROSCOPIC;  Surgeon: Franky Macho, MD;  Location: [facility];  Service: General;  Laterality: N/A;   MANDIBULAR HARDWARE REMOVAL N/A 05/06/2018   Procedure: REMOVAL OF MMF HARDWARE;  Surgeon: Osborn Coho, MD;  Location:  SURGERY CENTER;  Service: ENT;  Laterality: N/A;   NASAL SEPTUM SURGERY     NASAL SINUS SURGERY     POLYPECTOMY     right sfa recanalization and stenting      [facility] 12/2015   UPPER GASTROINTESTINAL ENDOSCOPY  07/26/2020   WRIST SURGERY     right and left   Current Outpatient Medications on File Prior to Visit  Medication Sig Dispense Refill   Armodafinil 250 MG tablet Take 250 mg by mouth daily.     aspirin EC 81 MG tablet Take 81 mg by mouth daily.     baclofen (LIORESAL) 10 MG tablet Take 10 mg by mouth 4 (  four) times daily.     clonazePAM (KLONOPIN) 1 MG tablet Take 1 mg by mouth 2 (two) times daily.     DULoxetine (CYMBALTA) 60 MG capsule Take 60 mg by mouth daily.     lamoTRIgine (LAMICTAL) 200 MG tablet Take 200 mg by mouth daily.     levothyroxine (SYNTHROID) 125 MCG tablet take 1 tablet (125 microgram total) by mouth daily before breakfast. 90 tablet 0   losartan (COZAAR) 50 MG tablet take 1 tablet (50 milligram total) by mouth daily. 90 tablet 0   metoprolol tartrate (LOPRESSOR) 25 MG tablet take (1) tablet twice daily. 180 tablet 0   morphine (MS CONTIN) 15 MG 12 hr tablet Take 15 mg by mouth 3 (three) times daily.     NARCAN 4 MG/0.1ML LIQD nasal spray kit SMARTSIG:1 Spray(s) Both Nares Once PRN     pantoprazole (PROTONIX) 40 MG tablet take (1) tablet twice daily. 60 tablet 0   pregabalin (LYRICA) 150 MG capsule  Take 150 mg by mouth 2 (two) times daily.     REXULTI 1 MG TABS tablet Take 1 mg by mouth daily.     rosuvastatin (CRESTOR) 40 MG tablet Take 1 tablet (40 mg total) by mouth daily. 90 tablet 3   valACYclovir (VALTREX) 500 MG tablet take 1 tablet (500 milligram total) by mouth daily. 90 tablet 0   vitamin E 1000 UNIT capsule Take 1,000 Units by mouth daily.     Fluticasone-Umeclidin-Vilant (TRELEGY ELLIPTA) 100-62.5-25 MCG/ACT AEPB Inhale 1 puff into the lungs daily. (Patient not taking: Reported on 09/28/2023) 1 each 0   tamsulosin (FLOMAX) 0.4 MG CAPS capsule TAKE 1 CAPSULE DAILY. (Patient not taking: Reported on 09/28/2023) 30 capsule 0   varenicline (CHANTIX CONTINUING MONTH PAK) 1 MG tablet Take 1 tablet (1 mg total) by mouth 2 (two) times daily. (Patient not taking: Reported on 09/28/2023) 60 tablet 3   [DISCONTINUED] esomeprazole (NEXIUM) 40 MG capsule Take 40 mg by mouth daily before breakfast.     No current facility-administered medications on file prior to visit.      No Known Allergies Social History   Socioeconomic History   Marital status: Married    Spouse name: Not on file   Number of children: 0   Years of education: Not on file   Highest education level: Associate degree: occupational, Scientist, product/process development, or vocational program  Occupational History   Not on file  Tobacco Use   Smoking status: Every Day    Current packs/day: 1.00    Average packs/day: 1 pack/day for 33.0 years (33.0 ttl pk-yrs)    Types: Cigarettes    Passive exposure: Current   Smokeless tobacco: Never   Tobacco comments:    Less than 1/2 pack per day 01/22/23 PAP  Vaping Use   Vaping status: Every Day  Substance and Sexual Activity   Alcohol use: No   Drug use: No   Sexual activity: Not Currently  Other Topics Concern   Not on file  Social History Narrative   Not on file   Social Determinants of Health   Financial Resource Strain: Low Risk  (06/18/2023)   Overall Financial Resource Strain  (CARDIA)    Difficulty of Paying Living Expenses: Not hard at all  Food Insecurity: No Food Insecurity (06/18/2023)   Hunger Vital Sign    Worried About Running Out of Food in the Last Year: Never true    Ran Out of Food in the Last Year: Never true  Transportation Needs:  No Transportation Needs (06/18/2023)   PRAPARE - Administrator, Civil Service (Medical): No    Lack of Transportation (Non-Medical): No  Physical Activity: Inactive (06/18/2023)   Exercise Vital Sign    Days of Exercise per Week: 0 days    Minutes of Exercise per Session: 0 min  Stress: Stress Concern Present (06/18/2023)   [employer] of Occupational Health - Occupational Stress Questionnaire    Feeling of Stress : To some extent  Social Connections: Moderately Isolated (06/18/2023)   Social Connection and Isolation Panel [NHANES]    Frequency of Communication with Friends and Family: Once a week    Frequency of Social Gatherings with Friends and Family: Once a week    Attends Religious Services: 1 to 4 times per year    Active Member of [employer] or Organizations: No    Attends Banker Meetings: Never    Marital Status: Married  Catering manager Violence: Not At Risk (12/05/2022)   Humiliation, Afraid, Rape, and Kick questionnaire    Fear of Current or Ex-Partner: No    Emotionally Abused: No    Physically Abused: No    Sexually Abused: No      Review of Systems  All other systems reviewed and are negative.      Objective:   Physical Exam Vitals reviewed.  Constitutional:      General: He is not in acute distress.    Appearance: Normal appearance. He is not ill-appearing, toxic-appearing or diaphoretic.  HENT:     Head: Normocephalic and atraumatic.  Cardiovascular:     Rate and Rhythm: Normal rate and regular rhythm.     Heart sounds: Normal heart sounds. No murmur heard.    No friction rub. No gallop.  Pulmonary:     Effort: Pulmonary effort is normal.     Breath sounds:  No wheezing or rhonchi.  Musculoskeletal:       Back:     Right lower leg: No edema.     Left lower leg: No edema.  Neurological:     General: No focal deficit present.     Mental Status: He is alert and oriented to person, place, and time.         Assessment & Plan:  ASCVD (arteriosclerotic cardiovascular disease) - Plan: COMPLETE METABOLIC PANEL WITH GFR, Lipid panel  Screening for lung cancer - Plan: CT CHEST LUNG CA SCREEN LOW DOSE W/O CM  Prostate cancer screening - Plan: PSA  Colon cancer screening - Plan: Ambulatory referral to Gastroenterology Because of the low back pain I will prescribe the patient a prednisone taper pack as requested.  I recommended we deferred the flu shot until after this.  He is due for lung cancer screening.  Therefore I will schedule the patient for a CT scan of the chest.  He is due for prostate cancer screening so I will check a PSA.  He is due for a colonoscopy so I will refer the patient back to his gastroenterologist.  While the patient is here today I will also check a CMP and a lipid panel.  Given his history of peripheral vascular disease I would like to see his LDL cholesterol less than 70.

## 2023-09-29 LAB — LIPID PANEL
Cholesterol: 145 mg/dL (ref <200)
HDL: 41 mg/dL (ref >=40)
LDL Cholesterol (Calc): 70 mg/dL
Non-HDL Cholesterol (Calc): 104 mg/dL (ref <130)
Total CHOL/HDL Ratio: 3.5 (calc) (ref <5.0)
Triglycerides: 244 mg/dL — ABNORMAL HIGH (ref <150)

## 2023-09-29 LAB — COMPLETE METABOLIC PANEL WITH GFR
AG Ratio: 2.3 (calc) (ref 1.0–2.5)
ALT: 4 U/L — ABNORMAL LOW (ref 9–46)
AST: 12 U/L (ref 10–35)
Albumin: 3.9 g/dL (ref 3.6–5.1)
Alkaline phosphatase (APISO): 82 U/L (ref 35–144)
BUN/Creatinine Ratio: 7 (calc) (ref 6–22)
BUN: 6 mg/dL — ABNORMAL LOW (ref 7–25)
CO2: 35 mmol/L — ABNORMAL HIGH (ref 20–32)
Calcium: 9 mg/dL (ref 8.6–10.3)
Chloride: 100 mmol/L (ref 98–110)
Creat: 0.83 mg/dL (ref 0.70–1.30)
Globulin: 1.7 g/dL — ABNORMAL LOW (ref 1.9–3.7)
Glucose, Bld: 101 mg/dL — ABNORMAL HIGH (ref 65–99)
Potassium: 3.8 mmol/L (ref 3.5–5.3)
Sodium: 142 mmol/L (ref 135–146)
Total Bilirubin: 0.5 mg/dL (ref 0.2–1.2)
Total Protein: 5.6 g/dL — ABNORMAL LOW (ref 6.1–8.1)
eGFR: 103 mL/min/{1.73_m2} (ref >=60)

## 2023-09-29 LAB — PSA: PSA: 0.6 ng/mL (ref <=4.00)

## 2023-10-01 ENCOUNTER — Encounter: Payer: Self-pay | Admitting: Family Medicine
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
## 2023-10-05 ENCOUNTER — Ambulatory Visit: Payer: Medicare HMO | Admitting: Internal Medicine

## 2023-10-07 DIAGNOSIS — J449 Chronic obstructive pulmonary disease, unspecified: Secondary | ICD-10-CM | POA: Diagnosis not present

## 2023-10-11 ENCOUNTER — Ambulatory Visit
Admission: RE | Admit: 2023-10-11 | Discharge: 2023-10-11 | Disposition: A | Discharge status: Home / Self Care | Payer: Medicare HMO | Source: Ambulatory Visit | Attending: Family Medicine | Admitting: Family Medicine

## 2023-10-11 DIAGNOSIS — G894 Chronic pain syndrome: Secondary | ICD-10-CM | POA: Diagnosis not present

## 2023-10-11 DIAGNOSIS — Z122 Encounter for screening for malignant neoplasm of respiratory organs: Secondary | ICD-10-CM

## 2023-10-11 DIAGNOSIS — M62830 Muscle spasm of back: Secondary | ICD-10-CM | POA: Diagnosis not present

## 2023-10-11 DIAGNOSIS — M47816 Spondylosis without myelopathy or radiculopathy, lumbar region: Secondary | ICD-10-CM | POA: Diagnosis not present

## 2023-10-11 DIAGNOSIS — Z87891 Personal history of nicotine dependence: Secondary | ICD-10-CM | POA: Diagnosis not present

## 2023-10-11 DIAGNOSIS — M47812 Spondylosis without myelopathy or radiculopathy, cervical region: Secondary | ICD-10-CM | POA: Diagnosis not present

## 2023-10-31 ENCOUNTER — Ambulatory Visit: Payer: Medicare HMO | Admitting: Orthopaedic Surgery

## 2023-11-06 DIAGNOSIS — J449 Chronic obstructive pulmonary disease, unspecified: Secondary | ICD-10-CM | POA: Diagnosis not present

## 2023-11-08 DIAGNOSIS — M47812 Spondylosis without myelopathy or radiculopathy, cervical region: Secondary | ICD-10-CM | POA: Diagnosis not present

## 2023-11-08 DIAGNOSIS — M62830 Muscle spasm of back: Secondary | ICD-10-CM | POA: Diagnosis not present

## 2023-11-08 DIAGNOSIS — G894 Chronic pain syndrome: Secondary | ICD-10-CM | POA: Diagnosis not present

## 2023-11-08 DIAGNOSIS — M47816 Spondylosis without myelopathy or radiculopathy, lumbar region: Secondary | ICD-10-CM | POA: Diagnosis not present

## 2023-12-07 DIAGNOSIS — J449 Chronic obstructive pulmonary disease, unspecified: Secondary | ICD-10-CM | POA: Diagnosis not present

## 2023-12-10 ENCOUNTER — Telehealth: Payer: Self-pay

## 2023-12-10 NOTE — Telephone Encounter (Signed)
 Copied from CRM [id number]. Topic: Clinical - Medication Question >> Dec 10, 2023  9:08 AM Powell B wrote: Reason for CRM: Patient was seen by PCP last month and Xray confirmed Bronchitis. Patient advises still not feeling better, requesting antibiotics and steroids and speak to nurse.

## 2023-12-11 ENCOUNTER — Ambulatory Visit (INDEPENDENT_AMBULATORY_CARE_PROVIDER_SITE_OTHER): Payer: Medicare HMO | Admitting: Family Medicine

## 2023-12-11 VITALS — BP 140/82 | HR 68 | Temp 98.3°F | Ht 71.0 in | Wt 254.0 lb

## 2023-12-11 DIAGNOSIS — J069 Acute upper respiratory infection, unspecified: Secondary | ICD-10-CM | POA: Insufficient documentation

## 2023-12-11 MED ORDER — PROMETHAZINE-DM 6.25-15 MG/5ML PO SYRP: 2.5000 mL | ORAL_SOLUTION | Freq: Four times a day (QID) | ORAL | 0 refills | Status: DC | PRN

## 2023-12-11 NOTE — Progress Notes (Signed)
 Subjective:  HPI: Tony Campos is a 58 y.o. male presenting on 12/11/2023 for Acute Visit (still not feeling well/)   HPI Patient is in today for 2 weeks cough (dry vs mucopurulent), nasal congestion, sinus congestion, body aches, headaches, wheezing. Was seen by UC last Monday and treated for PNA and bronchitis with antibiotics and cough medication, he does not recall the names of these but completed the antibiotics on Thursday or Friday. His symptoms modestly improved. Flu and covid were negative. Denies fever, shortness of breath, malaise, pleurisy.  Review of Systems  All other systems reviewed and are negative.   Relevant past medical history reviewed and updated as indicated.   Past Medical History:  Diagnosis Date   AAA (abdominal aortic aneurysm) without rupture (HCC)    3.4 cm found on mri 2/20   Acid reflux    Anemia    Anxiety    Aortic atherosclerosis (HCC)    Bipolar disorder (HCC)    Depression    Diverticulitis    Dyspnea    exertional   Genital warts    Hyperlipidemia    Hypertension    Hypothyroidism    Meningitis    PAD (peripheral artery disease) (HCC)    femoral artery angioplasy/stent   SIADH (syndrome of inappropriate ADH production) (HCC)    Sleep apnea 12/2015   Smoker    Smoker    Spondylosis    Testosterone  deficiency      Past Surgical History:  Procedure Laterality Date   APPENDECTOMY  12/2018   at [facility]   CHOLECYSTECTOMY  2007   CLOSED REDUCTION MANDIBLE N/A 03/09/2018   Procedure: CLOSED REDUCTION MANDIBULAR MMF;  Surgeon: Mable Alm, MD;  Location: [facility] OR;  Service: ENT;  Laterality: N/A;   COLONOSCOPY  07/26/2020   10/22/2018   DRUG INDUCED ENDOSCOPY N/A 04/06/2021   Procedure: DRUG INDUCED ENDOSCOPY;  Surgeon: Carlie Clark, MD;  Location: [facility];  Service: ENT;  Laterality: N/A;   IMPLANTATION OF HYPOGLOSSAL NERVE STIMULATOR Right 07/20/2021   Procedure: IMPLANTATION OF HYPOGLOSSAL NERVE STIMULATOR;   Surgeon: Carlie Clark, MD;  Location: [facility];  Service: ENT;  Laterality: Right;   KNEE SURGERY     right   LAPAROSCOPIC APPENDECTOMY N/A 07/25/2018   Procedure: APPENDECTOMY LAPAROSCOPIC;  Surgeon: Mavis Anes, MD;  Location: [facility];  Service: General;  Laterality: N/A;   MANDIBULAR HARDWARE REMOVAL N/A 05/06/2018   Procedure: REMOVAL OF MMF HARDWARE;  Surgeon: Mable Alm, MD;  Location:  SURGERY CENTER;  Service: ENT;  Laterality: N/A;   NASAL SEPTUM SURGERY     NASAL SINUS SURGERY     POLYPECTOMY     right sfa recanalization and stenting      [facility] 12/2015   UPPER GASTROINTESTINAL ENDOSCOPY  07/26/2020   WRIST SURGERY     right and left    Allergies and medications reviewed and updated.   Current Outpatient Medications:    Armodafinil  250 MG tablet, Take 250 mg by mouth daily., Disp: , Rfl:    aspirin EC 81 MG tablet, Take 81 mg by mouth daily., Disp: , Rfl:    baclofen  (LIORESAL ) 10 MG tablet, Take 10 mg by mouth 4 (four) times daily., Disp: , Rfl:    clonazePAM  (KLONOPIN ) 1 MG tablet, Take 1 mg by mouth 2 (two) times daily., Disp: , Rfl:    DULoxetine  (CYMBALTA ) 60 MG capsule, Take 60 mg by mouth daily., Disp: , Rfl:  lamoTRIgine  (LAMICTAL ) 200 MG tablet, Take 200 mg by mouth daily., Disp: , Rfl:    levothyroxine  (SYNTHROID ) 125 MCG tablet, take 1 tablet (125 microgram total) by mouth daily before breakfast., Disp: 90 tablet, Rfl: 0   losartan  (COZAAR ) 50 MG tablet, take 1 tablet (50 milligram total) by mouth daily., Disp: 90 tablet, Rfl: 0   metoprolol  tartrate (LOPRESSOR ) 25 MG tablet, take (1) tablet twice daily., Disp: 180 tablet, Rfl: 0   morphine  (MS CONTIN ) 15 MG 12 hr tablet, Take 15 mg by mouth 3 (three) times daily., Disp: , Rfl:    NARCAN 4 MG/0.1ML LIQD nasal spray kit, SMARTSIG:1 Spray(s) Both Nares Once PRN, Disp: , Rfl:    pantoprazole  (PROTONIX ) 40 MG tablet, TAKE (1) TABLET TWICE DAILY., Disp: 60 tablet, Rfl: 11    pregabalin  (LYRICA ) 150 MG capsule, Take 150 mg by mouth 2 (two) times daily., Disp: , Rfl:    promethazine -dextromethorphan (PROMETHAZINE -DM) 6.25-15 MG/5ML syrup, Take 2.5 mLs by mouth 4 (four) times daily as needed for cough., Disp: 118 mL, Rfl: 0   REXULTI  1 MG TABS tablet, Take 1 mg by mouth daily., Disp: , Rfl:    rosuvastatin  (CRESTOR ) 40 MG tablet, Take 1 tablet (40 mg total) by mouth daily., Disp: 90 tablet, Rfl: 3   valACYclovir  (VALTREX ) 500 MG tablet, take 1 tablet (500 milligram total) by mouth daily., Disp: 90 tablet, Rfl: 0   vitamin E 1000 UNIT capsule, Take 1,000 Units by mouth daily., Disp: , Rfl:    Fluticasone -Umeclidin-Vilant (TRELEGY ELLIPTA ) 100-62.5-25 MCG/ACT AEPB, Inhale 1 puff into the lungs daily. (Patient not taking: Reported on 12/11/2023), Disp: 1 each, Rfl: 0   predniSONE  (DELTASONE ) 20 MG tablet, 3 tabs poqday 1-2, 2 tabs poqday 3-4, 1 tab poqday 5-6 (Patient not taking: Reported on 12/11/2023), Disp: 12 tablet, Rfl: 0   tamsulosin  (FLOMAX ) 0.4 MG CAPS capsule, TAKE 1 CAPSULE DAILY. (Patient not taking: Reported on 12/11/2023), Disp: 30 capsule, Rfl: 0   varenicline  (CHANTIX  CONTINUING MONTH PAK) 1 MG tablet, Take 1 tablet (1 mg total) by mouth 2 (two) times daily. (Patient not taking: Reported on 12/11/2023), Disp: 60 tablet, Rfl: 3  No Known Allergies  Objective:   BP (!) 140/82   Pulse 68   Temp 98.3 F (36.8 C) (Oral)   Ht 5' 11 (1.803 m)   Wt 254 lb (115.2 kg)   SpO2 95%   BMI 35.43 kg/m      12/11/2023    4:02 PM 09/28/2023    3:31 PM 07/12/2023    9:50 AM  Vitals with BMI  Height 5' 11 5' 11 5' 11  Weight 254 lbs 254 lbs 252 lbs  BMI 35.44 35.44 35.16  Systolic 140 124 873  Diastolic 82 72 74  Pulse 68 72 62     Physical Exam Vitals and nursing note reviewed.  Constitutional:      Appearance: Normal appearance. He is normal weight.  HENT:     Head: Normocephalic and atraumatic.     Right Ear: Tympanic membrane, ear canal and external  ear normal.     Left Ear: Tympanic membrane, ear canal and external ear normal.     Nose: Nose normal.     Mouth/Throat:     Mouth: Mucous membranes are moist.     Pharynx: Oropharynx is clear.  Eyes:     Conjunctiva/sclera: Conjunctivae normal.  Cardiovascular:     Rate and Rhythm: Normal rate and regular rhythm.     Pulses:  Normal pulses.     Heart sounds: Normal heart sounds.  Pulmonary:     Effort: Pulmonary effort is normal.     Breath sounds: Normal breath sounds.  Musculoskeletal:     Cervical back: No tenderness.  Lymphadenopathy:     Cervical: No cervical adenopathy.  Skin:    General: Skin is warm and dry.     Capillary Refill: Capillary refill takes less than 2 seconds.  Neurological:     General: No focal deficit present.     Mental Status: He is alert and oriented to person, place, and time. Mental status is at baseline.  Psychiatric:        Mood and Affect: Mood normal.        Behavior: Behavior normal.        Thought Content: Thought content normal.        Judgment: Judgment normal.     Assessment & Plan:  Viral URI with cough Assessment & Plan: Symptoms modestly improved with 5 days of antibiotics. Will order follow up CXR and treat as indicated. Refill provided for Promethazine  DM. Return to office if symptoms persist or worsen.  Orders: -     DG Chest 2 View; Future  Other orders -     Promethazine -DM; Take 2.5 mLs by mouth 4 (four) times daily as needed for cough.  Dispense: 118 mL; Refill: 0     Follow up plan: Return if symptoms worsen or fail to improve.  Jeoffrey GORMAN Barrio, FNP

## 2023-12-11 NOTE — Assessment & Plan Note (Signed)
 Symptoms modestly improved with 5 days of antibiotics. Will order follow up CXR and treat as indicated. Refill provided for Promethazine DM. Return to office if symptoms persist or worsen.

## 2023-12-12 ENCOUNTER — Ambulatory Visit (HOSPITAL_COMMUNITY)
Admission: RE | Admit: 2023-12-12 | Discharge: 2023-12-12 | Disposition: A | Discharge status: Home / Self Care | Payer: Medicare HMO | Source: Ambulatory Visit | Attending: Family Medicine | Admitting: Family Medicine

## 2023-12-12 DIAGNOSIS — J069 Acute upper respiratory infection, unspecified: Secondary | ICD-10-CM | POA: Insufficient documentation

## 2023-12-12 DIAGNOSIS — R059 Cough, unspecified: Secondary | ICD-10-CM | POA: Diagnosis not present

## 2023-12-18 ENCOUNTER — Encounter: Payer: Self-pay | Admitting: Family Medicine

## 2023-12-18 DIAGNOSIS — F3181 Bipolar II disorder: Secondary | ICD-10-CM | POA: Diagnosis not present
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
## 2023-12-20 ENCOUNTER — Ambulatory Visit: Payer: Medicare HMO | Admitting: Internal Medicine

## 2023-12-24 DIAGNOSIS — G894 Chronic pain syndrome: Secondary | ICD-10-CM | POA: Diagnosis not present

## 2023-12-24 DIAGNOSIS — M47812 Spondylosis without myelopathy or radiculopathy, cervical region: Secondary | ICD-10-CM | POA: Diagnosis not present

## 2023-12-24 DIAGNOSIS — M47816 Spondylosis without myelopathy or radiculopathy, lumbar region: Secondary | ICD-10-CM | POA: Diagnosis not present

## 2023-12-24 DIAGNOSIS — M62830 Muscle spasm of back: Secondary | ICD-10-CM | POA: Diagnosis not present

## 2023-12-25 DIAGNOSIS — M5442 Lumbago with sciatica, left side: Secondary | ICD-10-CM | POA: Diagnosis not present

## 2023-12-25 DIAGNOSIS — M4316 Spondylolisthesis, lumbar region: Secondary | ICD-10-CM | POA: Diagnosis not present

## 2023-12-25 DIAGNOSIS — G8929 Other chronic pain: Secondary | ICD-10-CM | POA: Diagnosis not present

## 2024-01-02 ENCOUNTER — Other Ambulatory Visit (HOSPITAL_COMMUNITY): Payer: Self-pay | Admitting: Neurosurgery

## 2024-01-02 DIAGNOSIS — M4316 Spondylolisthesis, lumbar region: Secondary | ICD-10-CM

## 2024-01-07 DIAGNOSIS — J449 Chronic obstructive pulmonary disease, unspecified: Secondary | ICD-10-CM | POA: Diagnosis not present

## 2024-01-09 ENCOUNTER — Ambulatory Visit (HOSPITAL_COMMUNITY): Admission: RE | Admit: 2024-01-09 | Payer: Medicare HMO | Source: Ambulatory Visit

## 2024-01-10 ENCOUNTER — Other Ambulatory Visit: Payer: Self-pay | Admitting: Family Medicine

## 2024-01-10 DIAGNOSIS — R000 Tachycardia, unspecified: Secondary | ICD-10-CM

## 2024-01-11 ENCOUNTER — Other Ambulatory Visit: Payer: Self-pay | Admitting: Family Medicine

## 2024-01-14 NOTE — Telephone Encounter (Signed)
Requested medication (s) are due for refill today: yes   Requested medication (s) are on the active medication list: yes   Last refill:  09/12/23 #90 refills   Future visit scheduled: no   Notes to clinic:  no refills remain. Do you want to continue refills?     Requested Prescriptions  Pending Prescriptions Disp Refills   valACYclovir (VALTREX) 500 MG tablet [Pharmacy Med Name: VALACYCLOVIR HCL 500 MG TABLET] 90 tablet 0    Sig: take 1 tablet (500 milligram total) by mouth daily.     Antimicrobials:  Antiviral Agents - Anti-Herpetic Failed - 01/14/2024 10:41 AM      Failed - Valid encounter within last 12 months    Recent Outpatient Visits           1 year ago Pneumonia of right lower lobe due to infectious organism   [facility] Pickard, Priscille Heidelberg, MD   1 year ago Anemia, unspecified type   [facility] Tanya Nones Priscille Heidelberg, MD   1 year ago Syncope, unspecified syncope type   [facility] Donita Brooks, MD   2 years ago COPD exacerbation [facility])   [facility] Donita Brooks, MD   2 years ago SOB (shortness of breath)   [facility] Pickard, Priscille Heidelberg, MD

## 2024-01-16 ENCOUNTER — Ambulatory Visit (HOSPITAL_COMMUNITY): Payer: Medicare HMO

## 2024-01-21 DIAGNOSIS — M47812 Spondylosis without myelopathy or radiculopathy, cervical region: Secondary | ICD-10-CM | POA: Diagnosis not present

## 2024-01-21 DIAGNOSIS — M62830 Muscle spasm of back: Secondary | ICD-10-CM | POA: Diagnosis not present

## 2024-01-21 DIAGNOSIS — G894 Chronic pain syndrome: Secondary | ICD-10-CM | POA: Diagnosis not present

## 2024-01-21 DIAGNOSIS — M47816 Spondylosis without myelopathy or radiculopathy, lumbar region: Secondary | ICD-10-CM | POA: Diagnosis not present

## 2024-01-22 ENCOUNTER — Ambulatory Visit (HOSPITAL_COMMUNITY)
Admission: RE | Admit: 2024-01-22 | Discharge: 2024-01-22 | Disposition: A | Discharge status: Home / Self Care | Payer: Medicare HMO | Source: Ambulatory Visit | Attending: Neurosurgery | Admitting: Neurosurgery

## 2024-01-22 DIAGNOSIS — N281 Cyst of kidney, acquired: Secondary | ICD-10-CM | POA: Diagnosis not present

## 2024-01-22 DIAGNOSIS — I7143 Infrarenal abdominal aortic aneurysm, without rupture: Secondary | ICD-10-CM | POA: Diagnosis not present

## 2024-01-22 DIAGNOSIS — M4316 Spondylolisthesis, lumbar region: Secondary | ICD-10-CM | POA: Diagnosis not present

## 2024-01-22 DIAGNOSIS — M5136 Other intervertebral disc degeneration, lumbar region with discogenic back pain only: Secondary | ICD-10-CM | POA: Diagnosis not present

## 2024-02-04 DIAGNOSIS — J449 Chronic obstructive pulmonary disease, unspecified: Secondary | ICD-10-CM | POA: Diagnosis not present

## 2024-02-05 DIAGNOSIS — M5126 Other intervertebral disc displacement, lumbar region: Secondary | ICD-10-CM | POA: Diagnosis not present

## 2024-02-05 DIAGNOSIS — M5442 Lumbago with sciatica, left side: Secondary | ICD-10-CM | POA: Diagnosis not present

## 2024-02-06 DIAGNOSIS — M5416 Radiculopathy, lumbar region: Secondary | ICD-10-CM | POA: Diagnosis not present

## 2024-02-21 ENCOUNTER — Other Ambulatory Visit: Payer: Self-pay | Admitting: Neurosurgery

## 2024-02-21 DIAGNOSIS — M47816 Spondylosis without myelopathy or radiculopathy, lumbar region: Secondary | ICD-10-CM | POA: Diagnosis not present

## 2024-02-21 DIAGNOSIS — G894 Chronic pain syndrome: Secondary | ICD-10-CM | POA: Diagnosis not present

## 2024-02-21 DIAGNOSIS — M47812 Spondylosis without myelopathy or radiculopathy, cervical region: Secondary | ICD-10-CM | POA: Diagnosis not present

## 2024-02-21 DIAGNOSIS — M62830 Muscle spasm of back: Secondary | ICD-10-CM | POA: Diagnosis not present

## 2024-02-25 DIAGNOSIS — Z79891 Long term (current) use of opiate analgesic: Secondary | ICD-10-CM | POA: Diagnosis not present

## 2024-02-25 DIAGNOSIS — G894 Chronic pain syndrome: Secondary | ICD-10-CM | POA: Diagnosis not present

## 2024-03-04 ENCOUNTER — Encounter: Payer: Self-pay | Admitting: Family Medicine

## 2024-03-04 ENCOUNTER — Ambulatory Visit: Admitting: Family Medicine

## 2024-03-04 VITALS — BP 139/79 | HR 77 | Temp 99.6°F | Ht 71.0 in | Wt 272.0 lb

## 2024-03-04 DIAGNOSIS — M7989 Other specified soft tissue disorders: Secondary | ICD-10-CM | POA: Diagnosis not present

## 2024-03-04 DIAGNOSIS — D649 Anemia, unspecified: Secondary | ICD-10-CM | POA: Diagnosis not present

## 2024-03-04 LAB — CBC WITH DIFFERENTIAL/PLATELET
Absolute Lymphocytes: 2125 {cells}/uL (ref 850–3900)
Absolute Monocytes: 731 {cells}/uL (ref 200–950)
Basophils Absolute: 50 {cells}/uL (ref 0–200)
Basophils Relative: 0.6 %
Eosinophils Absolute: 0 {cells}/uL — ABNORMAL LOW (ref 15–500)
Eosinophils Relative: 0 %
HCT: 36.8 % — ABNORMAL LOW (ref 38.5–50.0)
Hemoglobin: 12.4 g/dL — ABNORMAL LOW (ref 13.2–17.1)
MCH: 30.1 pg (ref 27.0–33.0)
MCHC: 33.7 g/dL (ref 32.0–36.0)
MCV: 89.3 fL (ref 80.0–100.0)
MPV: 10.6 fL (ref 7.5–12.5)
Monocytes Relative: 8.7 %
Neutro Abs: 5494 {cells}/uL (ref 1500–7800)
Neutrophils Relative %: 65.4 %
Platelets: 194 10*3/uL (ref 140–400)
RBC: 4.12 10*6/uL — ABNORMAL LOW (ref 4.20–5.80)
RDW: 14 % (ref 11.0–15.0)
Total Lymphocyte: 25.3 %
WBC: 8.4 10*3/uL (ref 3.8–10.8)

## 2024-03-04 NOTE — Assessment & Plan Note (Signed)
 No palpable lymphadenopathy on exam. There is what feels like some soft tissue inflammation in the area of the submental nodes. This areas is soft and compressible. Discussed options of imaging vs watchful waiting and he would like to continue to monitor. Discussed concerning s/s to watch for, location of lymph nodes and characteristics to return to office for. Will continue to monitor and return to office if no resolution or if symptoms persist or worsen.

## 2024-03-04 NOTE — Progress Notes (Signed)
 Subjective:  HPI: Tony Campos is a 58 y.o. male presenting on 03/04/2024 for Acute Visit (Lump on rt side of face lump is located in the  drop down from the jaw line under chin has noticed it growing and reducing for the past x 2 wks. No drainage, no pain, )   HPI Patient is in today for a lump present on the right side of his neck for 2 weeks. He did notice one on the left side as well a week prior to this one arising and it has gone away. He has noticed it growing and shrinking in size. Denies redness, warmth, drainage, pain, fever, chills, night sweats, weight loss, fatigue, recent illness, tick bite, cat scratch, travel, dental infection or pain. He reports today symptoms are improving and it is decreasing in size since he first noticed it.    Review of Systems  All other systems reviewed and are negative.   Relevant past medical history reviewed and updated as indicated.   Past Medical History:  Diagnosis Date   AAA (abdominal aortic aneurysm) without rupture (HCC)    3.4 cm found on mri 2/20   Acid reflux    Anemia    Anxiety    Aortic atherosclerosis (HCC)    Bipolar disorder (HCC)    Depression    Diverticulitis    Dyspnea    exertional   Genital warts    Hyperlipidemia    Hypertension    Hypothyroidism    Meningitis    PAD (peripheral artery disease) (HCC)    femoral artery angioplasy/stent   SIADH (syndrome of inappropriate ADH production) (HCC)    Sleep apnea 12/2015   Smoker    Smoker    Spondylosis    Testosterone deficiency      Past Surgical History:  Procedure Laterality Date   APPENDECTOMY  12/2018   at [facility]   CHOLECYSTECTOMY  2007   CLOSED REDUCTION MANDIBLE N/A 03/09/2018   Procedure: CLOSED REDUCTION MANDIBULAR MMF;  Surgeon: Osborn Coho, MD;  Location: [facility] OR;  Service: ENT;  Laterality: N/A;   COLONOSCOPY  07/26/2020   10/22/2018   DRUG INDUCED ENDOSCOPY N/A 04/06/2021   Procedure: DRUG INDUCED ENDOSCOPY;  Surgeon: Christia Reading, MD;  Location: [facility];  Service: ENT;  Laterality: N/A;   IMPLANTATION OF HYPOGLOSSAL NERVE STIMULATOR Right 07/20/2021   Procedure: IMPLANTATION OF HYPOGLOSSAL NERVE STIMULATOR;  Surgeon: Christia Reading, MD;  Location: [facility];  Service: ENT;  Laterality: Right;   KNEE SURGERY     right   LAPAROSCOPIC APPENDECTOMY N/A 07/25/2018   Procedure: APPENDECTOMY LAPAROSCOPIC;  Surgeon: Franky Macho, MD;  Location: [facility];  Service: General;  Laterality: N/A;   MANDIBULAR HARDWARE REMOVAL N/A 05/06/2018   Procedure: REMOVAL OF MMF HARDWARE;  Surgeon: Osborn Coho, MD;  Location: [facility];  Service: ENT;  Laterality: N/A;   NASAL SEPTUM SURGERY     NASAL SINUS SURGERY     POLYPECTOMY     right sfa recanalization and stenting      [facility] 12/2015   UPPER GASTROINTESTINAL ENDOSCOPY  07/26/2020   WRIST SURGERY     right and left    Allergies and medications reviewed and updated.   Current Outpatient Medications:    Alpha-Lipoic Acid 600 MG CAPS, Take 1 capsule by mouth 2 (two) times daily. Takes with Lyrica, Disp: , Rfl:    aspirin EC 81 MG tablet, Take 81 mg by mouth daily.,  Disp: , Rfl:    baclofen (LIORESAL) 10 MG tablet, Take 10 mg by mouth 3 (three) times daily., Disp: , Rfl:    brexpiprazole (REXULTI) 2 MG TABS tablet, Take 2 mg by mouth daily., Disp: , Rfl:    clonazePAM (KLONOPIN) 1 MG tablet, Take 1 mg by mouth 2 (two) times daily., Disp: , Rfl:    DULoxetine (CYMBALTA) 60 MG capsule, Take 60 mg by mouth daily., Disp: , Rfl:    ferrous sulfate 325 (65 FE) MG tablet, Take 325 mg by mouth daily., Disp: , Rfl:    lamoTRIgine (LAMICTAL) 200 MG tablet, Take 200 mg by mouth 2 (two) times daily., Disp: , Rfl:    levothyroxine (SYNTHROID) 125 MCG tablet, take 1 tablet (125 microgram total) by mouth daily before breakfast., Disp: 90 tablet, Rfl: 0   losartan (COZAAR) 50 MG tablet, take 1 tablet (50 milligram total) by mouth daily.,  Disp: 90 tablet, Rfl: 0   metoprolol tartrate (LOPRESSOR) 25 MG tablet, take (1) tablet twice daily., Disp: 180 tablet, Rfl: 0   morphine (MS CONTIN) 15 MG 12 hr tablet, Take 15 mg by mouth 3 (three) times daily., Disp: , Rfl:    NARCAN 4 MG/0.1ML LIQD nasal spray kit, Place 1 spray into the nose once., Disp: , Rfl:    pantoprazole (PROTONIX) 40 MG tablet, TAKE (1) TABLET TWICE DAILY., Disp: 60 tablet, Rfl: 11   pregabalin (LYRICA) 150 MG capsule, Take 150 mg by mouth 2 (two) times daily., Disp: , Rfl:    rosuvastatin (CRESTOR) 40 MG tablet, Take 1 tablet (40 mg total) by mouth daily., Disp: 90 tablet, Rfl: 3   Solriamfetol HCl (SUNOSI) 150 MG TABS, Take 150 mg by mouth daily., Disp: , Rfl:    valACYclovir (VALTREX) 500 MG tablet, take 1 tablet (500 milligram total) by mouth daily., Disp: 90 tablet, Rfl: 0   vitamin B-12 (CYANOCOBALAMIN) 500 MCG tablet, Take 500 mcg by mouth daily., Disp: , Rfl:    Vitamin E 180 MG (400 UNIT) CAPS, Take 400 Units by mouth daily., Disp: , Rfl:   No Known Allergies  Objective:   BP 139/79   Pulse 77   Temp 99.6 F (37.6 C)   Ht 5\' 11"  (1.803 m)   Wt 272 lb (123.4 kg)   SpO2 99%   BMI 37.94 kg/m      03/04/2024   11:48 AM 12/11/2023    4:02 PM 09/28/2023    3:31 PM  Vitals with BMI  Height 5\' 11"  5\' 11"  5\' 11"   Weight 272 lbs 254 lbs 254 lbs  BMI 37.95 35.44 35.44  Systolic 139 140 409  Diastolic 79 82 72  Pulse 77 68 72     Physical Exam Vitals and nursing note reviewed.  Constitutional:      Appearance: Normal appearance. He is normal weight.  HENT:     Head: Normocephalic and atraumatic.  Lymphadenopathy:     Head:     Right side of head: No submental, submandibular, tonsillar, preauricular, posterior auricular or occipital adenopathy.     Left side of head: No submental, submandibular, tonsillar, preauricular, posterior auricular or occipital adenopathy.     Cervical: No cervical adenopathy.     Right cervical: No superficial, deep or  posterior cervical adenopathy.    Left cervical: No superficial, deep or posterior cervical adenopathy.     Upper Body:     Right upper body: No supraclavicular, axillary or pectoral adenopathy.     Left  upper body: No supraclavicular, axillary or pectoral adenopathy.  Skin:    General: Skin is warm and dry.     Capillary Refill: Capillary refill takes less than 2 seconds.  Neurological:     General: No focal deficit present.     Mental Status: He is alert and oriented to person, place, and time. Mental status is at baseline.  Psychiatric:        Mood and Affect: Mood normal.        Behavior: Behavior normal.        Thought Content: Thought content normal.        Judgment: Judgment normal.     Assessment & Plan:  Inflammation of soft tissue Assessment & Plan: No palpable lymphadenopathy on exam. There is what feels like some soft tissue inflammation in the area of the submental nodes. This areas is soft and compressible. Discussed options of imaging vs watchful waiting and he would like to continue to monitor. Discussed concerning s/s to watch for, location of lymph nodes and characteristics to return to office for. Will continue to monitor and return to office if no resolution or if symptoms persist or worsen.    Anemia, unspecified type -     CBC with Differential/Platelet     Follow up plan: Return if symptoms worsen or fail to improve.  Park Meo, FNP

## 2024-03-05 NOTE — Progress Notes (Signed)
 Surgical Instructions   Your procedure is scheduled on Wednesday, March 12, 2024. Report to [facility] Main Entrance "A" at 6:30 A.M., then check in with the Admitting office. Any questions or running late day of surgery: call [phone]  Questions prior to your surgery date: call [phone], Monday-Friday, 8am-4pm. If you experience any cold or flu symptoms such as cough, fever, chills, shortness of breath, etc. between now and your scheduled surgery, please notify us at the above number.     Remember:  Do not eat or drink after midnight the night before your surgery  Take these medicines the morning of surgery with A SIP OF WATER  baclofen (LIORESAL)  brexpiprazole (REXULTI)  clonazePAM (KLONOPIN)  DULoxetine (CYMBALTA)  lamoTRIgine (LAMICTAL)  levothyroxine (SYNTHROID)  metoprolol tartrate (LOPRESSOR)  morphine (MS CONTIN)  pantoprazole (PROTONIX)  pregabalin (LYRICA)  rosuvastatin (CRESTOR)  valACYclovir (VALTREX)   Per your surgeon, STOP taking any Aspirin for 5 days prior to your surgery. Last dose should be on Friday, April 11th..  One week prior to surgery, STOP taking any Aspirin (unless otherwise instructed by your surgeon) Aleve, Naproxen, Ibuprofen, Motrin, Advil, Goody's, BC's, all herbal medications, fish oil, and non-prescription vitamins.                     Do NOT Smoke (Tobacco/Vaping) for 24 hours prior to your procedure.  If you use a CPAP at night, you may bring your mask/headgear for your overnight stay.   You will be asked to remove any contacts, glasses, piercing's, hearing aid's, dentures/partials prior to surgery. Please bring cases for these items if needed.    Patients discharged the day of surgery will not be allowed to drive home, and someone needs to stay with them for 24 hours.  SURGICAL WAITING ROOM VISITATION Patients may have no more than 2 support people in the waiting area - these visitors may rotate.   Pre-op nurse will coordinate  an appropriate time for 1 ADULT support person, who may not rotate, to accompany patient in pre-op.  Children under the age of 73 must have an adult with them who is not the patient and must remain in the main waiting area with an adult.  If the patient needs to stay at the hospital during part of their recovery, the visitor guidelines for inpatient rooms apply.  Please refer to the [facility] website for the visitor guidelines for any additional information.   If you received a COVID test during your pre-op visit  it is requested that you wear a mask when out in public, stay away from anyone that may not be feeling well and notify your surgeon if you develop symptoms. If you have been in contact with anyone that has tested positive in the last 10 days please notify you surgeon.      Pre-operative 5 CHG Bathing Instructions   You can play a key role in reducing the risk of infection after surgery. Your skin needs to be as free of germs as possible. You can reduce the number of germs on your skin by washing with CHG (chlorhexidine gluconate) soap before surgery. CHG is an antiseptic soap that kills germs and continues to kill germs even after washing.   DO NOT use if you have an allergy to chlorhexidine/CHG or antibacterial soaps. If your skin becomes reddened or irritated, stop using the CHG and notify one of our RNs at [phone].   Please shower with the CHG soap starting 4 days  before surgery using the following schedule:     Please keep in mind the following:  DO NOT shave, including legs and underarms, starting the day of your first shower.   You may shave your face at any point before/day of surgery.  Place clean sheets on your bed the day you start using CHG soap. Use a clean washcloth (not used since being washed) for each shower. DO NOT sleep with pets once you start using the CHG.   CHG Shower Instructions:  Wash your face and private area with normal soap. If you choose  to wash your hair, wash first with your normal shampoo.  After you use shampoo/soap, rinse your hair and body thoroughly to remove shampoo/soap residue.  Turn the water OFF and apply about 3 tablespoons (45 ml) of CHG soap to a CLEAN washcloth.  Apply CHG soap ONLY FROM YOUR NECK DOWN TO YOUR TOES (washing for 3-5 minutes)  DO NOT use CHG soap on face, private areas, open wounds, or sores.  Pay special attention to the area where your surgery is being performed.  If you are having back surgery, having someone wash your back for you may be helpful. Wait 2 minutes after CHG soap is applied, then you may rinse off the CHG soap.  Pat dry with a clean towel  Put on clean clothes/pajamas   If you choose to wear lotion, please use ONLY the CHG-compatible lotions that are listed below.  Additional instructions for the day of surgery: DO NOT APPLY any lotions, deodorants, cologne, or perfumes.   Do not bring valuables to the hospital. [facility] is not responsible for any belongings/valuables. Do not wear nail polish, gel polish, artificial nails, or any other type of covering on natural nails (fingers and toes) Do not wear jewelry or makeup Put on clean/comfortable clothes.  Please brush your teeth.  Ask your nurse before applying any prescription medications to the skin.     CHG Compatible Lotions   Aveeno Moisturizing lotion  Cetaphil Moisturizing Cream  Cetaphil Moisturizing Lotion  Clairol Herbal Essence Moisturizing Lotion, Dry Skin  Clairol Herbal Essence Moisturizing Lotion, Extra Dry Skin  Clairol Herbal Essence Moisturizing Lotion, Normal Skin  Curel Age Defying Therapeutic Moisturizing Lotion with Alpha Hydroxy  Curel Extreme Care Body Lotion  Curel Soothing Hands Moisturizing Hand Lotion  Curel Therapeutic Moisturizing Cream, Fragrance-Free  Curel Therapeutic Moisturizing Lotion, Fragrance-Free  Curel Therapeutic Moisturizing Lotion, Original Formula  Eucerin Daily  Replenishing Lotion  Eucerin Dry Skin Therapy Plus Alpha Hydroxy Crme  Eucerin Dry Skin Therapy Plus Alpha Hydroxy Lotion  Eucerin Original Crme  Eucerin Original Lotion  Eucerin Plus Crme Eucerin Plus Lotion  Eucerin TriLipid Replenishing Lotion  Keri Anti-Bacterial Hand Lotion  Keri Deep Conditioning Original Lotion Dry Skin Formula Softly Scented  Keri Deep Conditioning Original Lotion, Fragrance Free Sensitive Skin Formula  Keri Lotion Fast Absorbing Fragrance Free Sensitive Skin Formula  Keri Lotion Fast Absorbing Softly Scented Dry Skin Formula  Keri Original Lotion  Keri Skin Renewal Lotion Keri Silky Smooth Lotion  Keri Silky Smooth Sensitive Skin Lotion  Nivea Body Creamy Conditioning Oil  Nivea Body Extra Enriched Lotion  Nivea Body Original Lotion  Nivea Body Sheer Moisturizing Lotion Nivea Crme  Nivea Skin Firming Lotion  NutraDerm 30 Skin Lotion  NutraDerm Skin Lotion  NutraDerm Therapeutic Skin Cream  NutraDerm Therapeutic Skin Lotion  ProShield Protective Hand Cream  Provon moisturizing lotion  Please read over the following fact sheets that you were given.

## 2024-03-06 ENCOUNTER — Other Ambulatory Visit: Payer: Self-pay

## 2024-03-06 ENCOUNTER — Encounter (HOSPITAL_COMMUNITY)
Admission: RE | Admit: 2024-03-06 | Discharge: 2024-03-06 | Disposition: A | Discharge status: Home / Self Care | Source: Ambulatory Visit | Attending: Neurosurgery | Admitting: Neurosurgery

## 2024-03-06 ENCOUNTER — Encounter (HOSPITAL_COMMUNITY): Payer: Self-pay

## 2024-03-06 VITALS — BP 163/87 | HR 83 | Temp 98.5°F | Resp 18 | Ht 71.0 in | Wt 275.2 lb

## 2024-03-06 DIAGNOSIS — G4733 Obstructive sleep apnea (adult) (pediatric): Secondary | ICD-10-CM | POA: Diagnosis not present

## 2024-03-06 DIAGNOSIS — M5126 Other intervertebral disc displacement, lumbar region: Secondary | ICD-10-CM | POA: Diagnosis not present

## 2024-03-06 DIAGNOSIS — E039 Hypothyroidism, unspecified: Secondary | ICD-10-CM | POA: Diagnosis not present

## 2024-03-06 DIAGNOSIS — F419 Anxiety disorder, unspecified: Secondary | ICD-10-CM | POA: Diagnosis not present

## 2024-03-06 DIAGNOSIS — J449 Chronic obstructive pulmonary disease, unspecified: Secondary | ICD-10-CM | POA: Diagnosis not present

## 2024-03-06 DIAGNOSIS — I700 Atherosclerosis of aorta: Secondary | ICD-10-CM | POA: Insufficient documentation

## 2024-03-06 DIAGNOSIS — Z95820 Peripheral vascular angioplasty status with implants and grafts: Secondary | ICD-10-CM | POA: Diagnosis not present

## 2024-03-06 DIAGNOSIS — I10 Essential (primary) hypertension: Secondary | ICD-10-CM | POA: Diagnosis not present

## 2024-03-06 DIAGNOSIS — F319 Bipolar disorder, unspecified: Secondary | ICD-10-CM | POA: Diagnosis not present

## 2024-03-06 DIAGNOSIS — Z9889 Other specified postprocedural states: Secondary | ICD-10-CM | POA: Diagnosis not present

## 2024-03-06 DIAGNOSIS — E785 Hyperlipidemia, unspecified: Secondary | ICD-10-CM | POA: Insufficient documentation

## 2024-03-06 DIAGNOSIS — I714 Abdominal aortic aneurysm, without rupture, unspecified: Secondary | ICD-10-CM | POA: Diagnosis not present

## 2024-03-06 DIAGNOSIS — K219 Gastro-esophageal reflux disease without esophagitis: Secondary | ICD-10-CM | POA: Diagnosis not present

## 2024-03-06 DIAGNOSIS — Z01818 Encounter for other preprocedural examination: Secondary | ICD-10-CM | POA: Diagnosis not present

## 2024-03-06 DIAGNOSIS — I739 Peripheral vascular disease, unspecified: Secondary | ICD-10-CM | POA: Diagnosis not present

## 2024-03-06 DIAGNOSIS — Z01812 Encounter for preprocedural laboratory examination: Secondary | ICD-10-CM | POA: Diagnosis present

## 2024-03-06 DIAGNOSIS — Z01810 Encounter for preprocedural cardiovascular examination: Secondary | ICD-10-CM | POA: Diagnosis present

## 2024-03-06 LAB — BASIC METABOLIC PANEL WITH GFR
Anion gap: 9 (ref 5–15)
BUN: 8 mg/dL (ref 6–20)
CO2: 34 mmol/L — ABNORMAL HIGH (ref 22–32)
Calcium: 9.6 mg/dL (ref 8.9–10.3)
Chloride: 101 mmol/L (ref 98–111)
Creatinine, Ser: 1.07 mg/dL (ref 0.61–1.24)
GFR, Estimated: >60 mL/min (ref >60)
Glucose, Bld: 111 mg/dL — ABNORMAL HIGH (ref 70–99)
Potassium: 4.7 mmol/L (ref 3.5–5.1)
Sodium: 144 mmol/L (ref 135–145)

## 2024-03-06 LAB — TYPE AND SCREEN
ABO/RH(D): A POS
Antibody Screen: NEGATIVE

## 2024-03-06 LAB — SURGICAL PCR SCREEN
MRSA, PCR: NEGATIVE
Staphylococcus aureus: NEGATIVE

## 2024-03-06 NOTE — Progress Notes (Signed)
 PCP - Dr. Lynnea Ferrier Cardiologist - Dina Rich LOV 09-10-20 w/follow up as needed  PPM/ICD - Denies Device Orders - n/a Rep Notified - n/a  Patient has an Inspire with a remote.  He was instructed to bring the remote in on day of surgery.   Chest x-ray - N/A EKG - 03-06-24 Stress Test - Denies ECHO - 11-05-13 Cardiac Cath - Denies  Sleep Study - Yes CPAP - Used to wear until he had the Inspire placed in right chest  Non-diabetic  Last dose of GLP1 agonist-  Denies GLP1 instructions: N/a  Blood Thinner Instructions:Denies Aspirin Instructions: LD 03-07-24 per surgeon on a 5 day hold.  ERAS Protcol - NPO PRE-SURGERY Ensure or G2- none  COVID TEST- n/a   Anesthesia review: Yes  Patient denies shortness of breath, fever, cough and chest pain at PAT appointment. Patient denies any respiratory issues at this time.    All instructions explained to the patient, with a verbal understanding of the material. Patient agrees to go over the instructions while at home for a better understanding. Patient also instructed to self quarantine after being tested for COVID-19. The opportunity to ask questions was provided.

## 2024-03-07 ENCOUNTER — Encounter (HOSPITAL_COMMUNITY): Payer: Self-pay

## 2024-03-07 NOTE — Anesthesia Preprocedure Evaluation (Addendum)
 Anesthesia Evaluation  Patient identified by MRN, date of birth, ID band Patient awake    Reviewed: Allergy & Precautions, NPO status , Patient's Chart, lab work & pertinent test results, reviewed documented beta blocker date and time   History of Anesthesia Complications Negative for: history of anesthetic complications  Airway Mallampati: II  TM Distance: >3 FB Neck ROM: Full    Dental  (+) Missing, Dental Advisory Given   Pulmonary sleep apnea (Inspire) , Current Smoker and Patient abstained from smoking.   breath sounds clear to auscultation       Cardiovascular hypertension, Pt. on medications and Pt. on home beta blockers (-) angina + Peripheral Vascular Disease (AAA 4.0)   Rhythm:Regular Rate:Normal     Neuro/Psych  Headaches  Anxiety Depression Bipolar Disorder   Back pain: narcotics    GI/Hepatic Neg liver ROS,GERD  Controlled and Medicated,,  Endo/Other  Hypothyroidism  BMI 38.4  Renal/GU negative Renal ROS     Musculoskeletal   Abdominal   Peds  Hematology Plt 194k, Hb 12.4   Anesthesia Other Findings   Reproductive/Obstetrics                             Anesthesia Physical Anesthesia Plan  ASA: 3  Anesthesia Plan: General   Post-op Pain Management: Tylenol PO (pre-op)*, Ketamine IV* and Dilaudid IV   Induction: Intravenous  PONV Risk Score and Plan: 1 and Dexamethasone and Ondansetron  Airway Management Planned: Oral ETT  Additional Equipment: None  Intra-op Plan:   Post-operative Plan: Extubation in OR  Informed Consent: I have reviewed the patients History and Physical, chart, labs and discussed the procedure including the risks, benefits and alternatives for the proposed anesthesia with the patient or authorized representative who has indicated his/her understanding and acceptance.     Dental advisory given  Plan Discussed with: CRNA and  Surgeon  Anesthesia Plan Comments: (PAT note written 03/07/2024 by Allison Zelenak, PA-C.  )       Anesthesia Quick Evaluation

## 2024-03-07 NOTE — Progress Notes (Signed)
 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   03/06/24 EKG showed normal sinus rhythm he denied chest pain and shortness of breath at PAT RN visit  Anesthesia team to evaluate on the day of surgery.  He reported instructions to hold aspirin 5 days prior to procedure.   VS: BP (!) 163/87   Pulse 83   Temp 36.9 C   Resp 18   Ht 5\' 11"  (1.803 m)   Wt 124.8 kg   SpO2 95%   BMI 38.38 kg/m   PROVIDERS: Donita Brooks, MD is PCP - Dohmeier, Porfirio Mylar, MD is neurologist (OSA and hypersomnia, "HLD single factor was positive for narcolepsy associated factor"). Poor compliance with Inspire device at 03/21/23 office follow-up. - He is not routinely followed by cardiology.  He had an evaluation by cardiologist Dina Rich, MD on 09/10/2020 for dyspnea on exertion felt largely due to deconditioning and 30 pound weight gain.  He had had improvement in symptoms with no signs of volume overload or chest pain.  EKG normal.  Normal LVEF by 2014 echo.  No additional testing recommended at that time with as needed follow-up if worsening symptoms.   LABS: 03/04/24 and 03/06/24 labs in [facility] reviewed. (all labs ordered are listed, but only  abnormal results are displayed)  Labs Reviewed  BASIC METABOLIC PANEL WITH GFR - Abnormal; Notable for the following components:      Result Value   CO2 34 (*)    Glucose, Bld 111 (*)    All other components within normal limits  SURGICAL PCR SCREEN  TYPE AND SCREEN   Lab Results  Component Value Date   WBC 8.4 03/04/2024   HGB 12.4 (L) 03/04/2024   HCT 36.8 (L) 03/04/2024   MCV 89.3 03/04/2024   PLT 194 03/04/2024      IMAGES: MRI L-spine 01/22/24: IMPRESSION: 1. Positive for Abdominal Aortic Aneurysm measuring 4 cm. Recommend CTA or MRA, as appropriate, in 12 months and referral to vascular specialist. Reference: Journal of Vascular Surgery 67.1 (2018): 2-77. J Am Coll Radiol [phone]. Aortic aneurysm NOS (ICD10-I71.9). 2. Chronic postoperative changes on the left at L4-L5. Grade 1 anterolisthesis there is progressed since the 2023 MRI and there is a new moderate sized disc extrusion into the left lateral recess. Severe left lateral recess stenosis. Contralateral right foraminal disc protrusion has also progressed. Query right L4 and/or Left L5 radiculitis. 3. Chronic leftward disc and endplate degeneration at L5-S1. Stable mild to moderate chronic left L5 neural foraminal stenosis there.   CXR 12/12/23: FINDINGS: - Cardiac silhouette is normal in size. No mediastinal or hilar masses. No evidence of adenopathy. - Minor linear opacities noted in the lung bases consistent with atelectasis or scarring. Remainder of the lungs is clear. No pleural effusion or pneumothorax. - Right anterior chest wall stimulator  device is stable. - Skeletal structures are intact. IMPRESSION: No active cardiopulmonary disease.  CT Chest LCS 10/11/23: IMPRESSION: 1. Lung-RADS 1, negative. Continue annual screening with low-dose chest CT without contrast in 12 months. 2. Two-vessel coronary atherosclerosis. 3. Aortic Atherosclerosis (ICD10-I70.0) and Emphysema  (ICD10-J43.9).   EKG: 03/06/24: Normal sinus rhythm Normal ECG When compared with ECG of 09-Mar-2018 07:40, No significant change was found Confirmed by Delrae Rend [phone]) on 03/06/2024 8:10:31 PM   CV: Echo 11/05/13: Study Conclusions  - Study data: Technically adequate study.  - Left ventricle: The cavity size was normal. Wall thickness    was increased in a pattern of mild LVH. Systolic function    was normal. The estimated ejection fraction was in the    range of 60% to 65%. Wall motion was normal; there were no    regional wall motion abnormalities. Doppler parameters are    consistent with abnormal left ventricular relaxation    (grade 1 diastolic dysfunction).  - Aortic valve: Valve area: 3.06cm^2(VTI). Valve area:    2.86cm^2 (Vmax).  - Atrial septum: No defect or patent foramen ovale was    identified.  - Inferior vena cava: The vessel was normal in size; the    respirophasic diameter changes were in the normal range (=    50%); findings are consistent with normal central venous    pressure.   Past Medical History:  Diagnosis Date   AAA (abdominal aortic aneurysm) without rupture (HCC)    3.4 cm found on mri 2/20   Acid reflux    Anemia    Anxiety    Aortic atherosclerosis (HCC)    Bipolar disorder (HCC)    Depression    Diverticulitis    Dyspnea    exertional   Genital warts    Hyperlipidemia    Hypertension    Hypothyroidism    Meningitis    PAD (peripheral artery disease) (HCC)    femoral artery angioplasy/stent   SIADH (syndrome of inappropriate ADH production) (HCC)    Sleep apnea 12/2015   No cpap, has Inspire   Smoker    Smoker    Spondylosis    Testosterone deficiency     Past Surgical History:  Procedure Laterality Date   APPENDECTOMY  12/2018   at [facility]   CHOLECYSTECTOMY  2007   CLOSED REDUCTION MANDIBLE N/A 03/09/2018   Procedure: CLOSED REDUCTION MANDIBULAR MMF;  Surgeon: Osborn Coho, MD;  Location: [facility] OR;  Service:  ENT;  Laterality: N/A;   COLONOSCOPY  07/26/2020   10/22/2018   DRUG INDUCED ENDOSCOPY N/A 04/06/2021   Procedure: DRUG INDUCED ENDOSCOPY;  Surgeon: Christia Reading, MD;  Location: [facility];  Service: ENT;  Laterality: N/A;   IMPLANTATION OF HYPOGLOSSAL NERVE STIMULATOR Right 07/20/2021   Procedure: IMPLANTATION OF HYPOGLOSSAL NERVE STIMULATOR;  Surgeon: Christia Reading, MD;  Location: [facility];  Service: ENT;  Laterality: Right;   KNEE SURGERY     3 on right knee and 1 on left knee   LAPAROSCOPIC APPENDECTOMY N/A 07/25/2018   Procedure: APPENDECTOMY LAPAROSCOPIC;  Surgeon: Franky Macho, MD;  Location: [facility];  Service: General;  Laterality: N/A;   MANDIBULAR HARDWARE REMOVAL N/A 05/06/2018   Procedure: REMOVAL OF MMF HARDWARE;  Surgeon: Osborn Coho, MD;  Location: [facility];  Service: ENT;  Laterality: N/A;   NASAL SEPTUM SURGERY     NASAL SINUS SURGERY     POLYPECTOMY     right sfa recanalization and  stenting      [facility] 12/2015   UPPER GASTROINTESTINAL ENDOSCOPY  07/26/2020   WRIST SURGERY     right and left    MEDICATIONS:  Alpha-Lipoic Acid 600 MG CAPS   aspirin EC 81 MG tablet   baclofen (LIORESAL) 10 MG tablet   brexpiprazole (REXULTI) 2 MG TABS tablet   clonazePAM (KLONOPIN) 1 MG tablet   DULoxetine (CYMBALTA) 60 MG capsule   ferrous sulfate 325 (65 FE) MG tablet   lamoTRIgine (LAMICTAL) 200 MG tablet   levothyroxine (SYNTHROID) 125 MCG tablet   losartan (COZAAR) 50 MG tablet   metoprolol tartrate (LOPRESSOR) 25 MG tablet   morphine (MS CONTIN) 15 MG 12 hr tablet   NARCAN 4 MG/0.1ML LIQD nasal spray kit   pantoprazole (PROTONIX) 40 MG tablet   pregabalin (LYRICA) 150 MG capsule   rosuvastatin (CRESTOR) 40 MG tablet   Solriamfetol HCl (SUNOSI) 150 MG TABS   valACYclovir (VALTREX) 500 MG tablet   vitamin B-12 (CYANOCOBALAMIN) 500 MCG tablet   Vitamin E 180 MG (400 UNIT) CAPS   No current facility-administered  medications for this encounter.    Shonna Chock, PA-C Surgical Short Stay/Anesthesiology [facility] Phone [phone] [facility] Phone [phone] 03/07/2024 5:43 PM

## 2024-03-12 ENCOUNTER — Other Ambulatory Visit: Payer: Self-pay

## 2024-03-12 ENCOUNTER — Ambulatory Visit (HOSPITAL_COMMUNITY): Payer: Self-pay | Admitting: Vascular Surgery

## 2024-03-12 ENCOUNTER — Ambulatory Visit (HOSPITAL_BASED_OUTPATIENT_CLINIC_OR_DEPARTMENT_OTHER)

## 2024-03-12 ENCOUNTER — Inpatient Hospital Stay (HOSPITAL_COMMUNITY)
Admission: AD | Admit: 2024-03-12 | Discharge: 2024-03-22 | DRG: 402 | Disposition: A | Discharge status: Home Health | Attending: Neurosurgery | Admitting: Neurosurgery

## 2024-03-12 ENCOUNTER — Ambulatory Visit (HOSPITAL_COMMUNITY)

## 2024-03-12 ENCOUNTER — Encounter (HOSPITAL_COMMUNITY)
Admission: AD | Disposition: A | Discharge status: Home Health | Payer: Self-pay | Source: Home / Self Care | Attending: Neurosurgery

## 2024-03-12 ENCOUNTER — Encounter (HOSPITAL_COMMUNITY): Payer: Self-pay | Admitting: Neurosurgery

## 2024-03-12 DIAGNOSIS — M6282 Rhabdomyolysis: Secondary | ICD-10-CM | POA: Diagnosis not present

## 2024-03-12 DIAGNOSIS — I10 Essential (primary) hypertension: Secondary | ICD-10-CM | POA: Diagnosis present

## 2024-03-12 DIAGNOSIS — G894 Chronic pain syndrome: Secondary | ICD-10-CM | POA: Diagnosis present

## 2024-03-12 DIAGNOSIS — E538 Deficiency of other specified B group vitamins: Secondary | ICD-10-CM | POA: Diagnosis not present

## 2024-03-12 DIAGNOSIS — I700 Atherosclerosis of aorta: Secondary | ICD-10-CM | POA: Diagnosis present

## 2024-03-12 DIAGNOSIS — F319 Bipolar disorder, unspecified: Secondary | ICD-10-CM | POA: Diagnosis present

## 2024-03-12 DIAGNOSIS — Z9049 Acquired absence of other specified parts of digestive tract: Secondary | ICD-10-CM

## 2024-03-12 DIAGNOSIS — Z9689 Presence of other specified functional implants: Secondary | ICD-10-CM | POA: Diagnosis present

## 2024-03-12 DIAGNOSIS — F17210 Nicotine dependence, cigarettes, uncomplicated: Secondary | ICD-10-CM | POA: Diagnosis present

## 2024-03-12 DIAGNOSIS — K219 Gastro-esophageal reflux disease without esophagitis: Secondary | ICD-10-CM | POA: Diagnosis present

## 2024-03-12 DIAGNOSIS — I959 Hypotension, unspecified: Secondary | ICD-10-CM | POA: Diagnosis not present

## 2024-03-12 DIAGNOSIS — M5116 Intervertebral disc disorders with radiculopathy, lumbar region: Principal | ICD-10-CM | POA: Diagnosis present

## 2024-03-12 DIAGNOSIS — J811 Chronic pulmonary edema: Secondary | ICD-10-CM | POA: Diagnosis not present

## 2024-03-12 DIAGNOSIS — E785 Hyperlipidemia, unspecified: Secondary | ICD-10-CM | POA: Diagnosis not present

## 2024-03-12 DIAGNOSIS — J449 Chronic obstructive pulmonary disease, unspecified: Secondary | ICD-10-CM | POA: Diagnosis not present

## 2024-03-12 DIAGNOSIS — I739 Peripheral vascular disease, unspecified: Secondary | ICD-10-CM | POA: Diagnosis present

## 2024-03-12 DIAGNOSIS — K5900 Constipation, unspecified: Secondary | ICD-10-CM | POA: Diagnosis not present

## 2024-03-12 DIAGNOSIS — J9622 Acute and chronic respiratory failure with hypercapnia: Secondary | ICD-10-CM | POA: Diagnosis not present

## 2024-03-12 DIAGNOSIS — F419 Anxiety disorder, unspecified: Secondary | ICD-10-CM | POA: Diagnosis present

## 2024-03-12 DIAGNOSIS — F32A Depression, unspecified: Secondary | ICD-10-CM | POA: Diagnosis present

## 2024-03-12 DIAGNOSIS — Z800 Family history of malignant neoplasm of digestive organs: Secondary | ICD-10-CM

## 2024-03-12 DIAGNOSIS — Z01818 Encounter for other preprocedural examination: Secondary | ICD-10-CM | POA: Diagnosis not present

## 2024-03-12 DIAGNOSIS — I714 Abdominal aortic aneurysm, without rupture, unspecified: Secondary | ICD-10-CM | POA: Diagnosis present

## 2024-03-12 DIAGNOSIS — F3181 Bipolar II disorder: Secondary | ICD-10-CM | POA: Diagnosis present

## 2024-03-12 DIAGNOSIS — J9601 Acute respiratory failure with hypoxia: Secondary | ICD-10-CM | POA: Diagnosis present

## 2024-03-12 DIAGNOSIS — Z6838 Body mass index (BMI) 38.0-38.9, adult: Secondary | ICD-10-CM

## 2024-03-12 DIAGNOSIS — M4726 Other spondylosis with radiculopathy, lumbar region: Secondary | ICD-10-CM | POA: Diagnosis present

## 2024-03-12 DIAGNOSIS — E66812 Obesity, class 2: Secondary | ICD-10-CM | POA: Diagnosis present

## 2024-03-12 DIAGNOSIS — M5126 Other intervertebral disc displacement, lumbar region: Secondary | ICD-10-CM | POA: Diagnosis not present

## 2024-03-12 DIAGNOSIS — M48062 Spinal stenosis, lumbar region with neurogenic claudication: Secondary | ICD-10-CM | POA: Diagnosis present

## 2024-03-12 DIAGNOSIS — E039 Hypothyroidism, unspecified: Secondary | ICD-10-CM | POA: Diagnosis present

## 2024-03-12 DIAGNOSIS — J9602 Acute respiratory failure with hypercapnia: Secondary | ICD-10-CM | POA: Diagnosis present

## 2024-03-12 DIAGNOSIS — Z981 Arthrodesis status: Secondary | ICD-10-CM | POA: Diagnosis not present

## 2024-03-12 DIAGNOSIS — Z79891 Long term (current) use of opiate analgesic: Secondary | ICD-10-CM

## 2024-03-12 DIAGNOSIS — G4733 Obstructive sleep apnea (adult) (pediatric): Secondary | ICD-10-CM | POA: Diagnosis present

## 2024-03-12 DIAGNOSIS — Z8661 Personal history of infections of the central nervous system: Secondary | ICD-10-CM

## 2024-03-12 DIAGNOSIS — Z6837 Body mass index (BMI) 37.0-37.9, adult: Secondary | ICD-10-CM

## 2024-03-12 DIAGNOSIS — D62 Acute posthemorrhagic anemia: Secondary | ICD-10-CM | POA: Diagnosis not present

## 2024-03-12 DIAGNOSIS — Z79899 Other long term (current) drug therapy: Secondary | ICD-10-CM

## 2024-03-12 DIAGNOSIS — M4316 Spondylolisthesis, lumbar region: Principal | ICD-10-CM | POA: Diagnosis present

## 2024-03-12 DIAGNOSIS — R339 Retention of urine, unspecified: Secondary | ICD-10-CM | POA: Diagnosis not present

## 2024-03-12 DIAGNOSIS — J9621 Acute and chronic respiratory failure with hypoxia: Secondary | ICD-10-CM | POA: Diagnosis not present

## 2024-03-12 DIAGNOSIS — R197 Diarrhea, unspecified: Secondary | ICD-10-CM | POA: Diagnosis not present

## 2024-03-12 DIAGNOSIS — J189 Pneumonia, unspecified organism: Secondary | ICD-10-CM | POA: Diagnosis not present

## 2024-03-12 DIAGNOSIS — Z8719 Personal history of other diseases of the digestive system: Secondary | ICD-10-CM

## 2024-03-12 DIAGNOSIS — Z8679 Personal history of other diseases of the circulatory system: Secondary | ICD-10-CM

## 2024-03-12 DIAGNOSIS — Z9889 Other specified postprocedural states: Secondary | ICD-10-CM

## 2024-03-12 DIAGNOSIS — N179 Acute kidney failure, unspecified: Secondary | ICD-10-CM | POA: Diagnosis not present

## 2024-03-12 DIAGNOSIS — G9341 Metabolic encephalopathy: Secondary | ICD-10-CM | POA: Diagnosis not present

## 2024-03-12 DIAGNOSIS — I2489 Other forms of acute ischemic heart disease: Secondary | ICD-10-CM | POA: Diagnosis not present

## 2024-03-12 DIAGNOSIS — Z7982 Long term (current) use of aspirin: Secondary | ICD-10-CM

## 2024-03-12 DIAGNOSIS — Z79890 Hormone replacement therapy (postmenopausal): Secondary | ICD-10-CM

## 2024-03-12 DIAGNOSIS — E8729 Other acidosis: Secondary | ICD-10-CM | POA: Diagnosis not present

## 2024-03-12 LAB — ABO/RH: ABO/RH(D): A POS

## 2024-03-12 SURGERY — POSTERIOR LUMBAR FUSION 1 LEVEL
Anesthesia: General

## 2024-03-12 MED ORDER — ROCURONIUM BROMIDE 10 MG/ML (PF) SYRINGE
PREFILLED_SYRINGE | INTRAVENOUS | Status: DC | PRN
  Administered 2024-03-12 (×3): 10 mg via INTRAVENOUS
  Administered 2024-03-12: 70 mg via INTRAVENOUS
  Administered 2024-03-12: 10 mg via INTRAVENOUS

## 2024-03-12 MED ORDER — DULOXETINE HCL 60 MG PO CPEP
60.0000 mg | ORAL_CAPSULE | Freq: Every day | ORAL | Status: DC
  Administered 2024-03-12 – 2024-03-22 (×11): 60 mg via ORAL
  Filled 2024-03-12 (×11): qty 1

## 2024-03-12 MED ORDER — LAMOTRIGINE 100 MG PO TABS
200.0000 mg | ORAL_TABLET | Freq: Two times a day (BID) | ORAL | Status: DC
  Administered 2024-03-12 – 2024-03-22 (×19): 200 mg via ORAL
  Filled 2024-03-12 (×20): qty 2

## 2024-03-12 MED ORDER — EPHEDRINE SULFATE-NACL 50-0.9 MG/10ML-% IV SOSY
PREFILLED_SYRINGE | INTRAVENOUS | Status: DC | PRN
  Administered 2024-03-12: 5 mg via INTRAVENOUS
  Administered 2024-03-12 (×2): 10 mg via INTRAVENOUS

## 2024-03-12 MED ORDER — OXYCODONE HCL 5 MG PO TABS
5.0000 mg | ORAL_TABLET | ORAL | Status: DC | PRN
  Administered 2024-03-13 – 2024-03-15 (×3): 5 mg via ORAL
  Filled 2024-03-12 (×3): qty 1

## 2024-03-12 MED ORDER — MIDAZOLAM HCL 2 MG/2ML IJ SOLN
INTRAMUSCULAR | Status: AC
  Filled 2024-03-12: qty 2

## 2024-03-12 MED ORDER — METOPROLOL TARTRATE 25 MG PO TABS
25.0000 mg | ORAL_TABLET | Freq: Two times a day (BID) | ORAL | Status: DC
  Administered 2024-03-12 – 2024-03-18 (×11): 25 mg via ORAL
  Filled 2024-03-12 (×11): qty 1

## 2024-03-12 MED ORDER — FENTANYL CITRATE (PF) 250 MCG/5ML IJ SOLN
INTRAMUSCULAR | Status: DC | PRN
  Administered 2024-03-12: 250 ug via INTRAVENOUS

## 2024-03-12 MED ORDER — CHLORHEXIDINE GLUCONATE 0.12 % MT SOLN
15.0000 mL | Freq: Once | OROMUCOSAL | Status: AC
  Administered 2024-03-12: 15 mL via OROMUCOSAL
  Filled 2024-03-12: qty 15

## 2024-03-12 MED ORDER — OXYCODONE HCL 5 MG PO TABS
ORAL_TABLET | ORAL | Status: AC
Start: 2024-03-12 — End: 2024-03-13
  Filled 2024-03-12: qty 1

## 2024-03-12 MED ORDER — LEVOTHYROXINE SODIUM 25 MCG PO TABS
125.0000 ug | ORAL_TABLET | Freq: Every day | ORAL | Status: DC
  Administered 2024-03-13 – 2024-03-22 (×9): 125 ug via ORAL
  Filled 2024-03-12 (×9): qty 1

## 2024-03-12 MED ORDER — BACLOFEN 10 MG PO TABS
10.0000 mg | ORAL_TABLET | Freq: Three times a day (TID) | ORAL | Status: DC
  Administered 2024-03-12 – 2024-03-18 (×16): 10 mg via ORAL
  Filled 2024-03-12 (×16): qty 1

## 2024-03-12 MED ORDER — MORPHINE SULFATE ER 15 MG PO TBCR
15.0000 mg | EXTENDED_RELEASE_TABLET | Freq: Three times a day (TID) | ORAL | Status: DC
  Administered 2024-03-12 – 2024-03-16 (×11): 15 mg via ORAL
  Filled 2024-03-12 (×11): qty 1

## 2024-03-12 MED ORDER — ACETAMINOPHEN 650 MG RE SUPP: 650.0000 mg | RECTAL | Status: DC | PRN

## 2024-03-12 MED ORDER — HYDROMORPHONE HCL 1 MG/ML IJ SOLN
INTRAMUSCULAR | Status: AC
  Filled 2024-03-12: qty 1

## 2024-03-12 MED ORDER — THROMBIN 5000 UNITS EX SOLR
OROMUCOSAL | Status: DC | PRN
  Administered 2024-03-12: 5 mL via TOPICAL

## 2024-03-12 MED ORDER — PANTOPRAZOLE SODIUM 40 MG PO TBEC
40.0000 mg | DELAYED_RELEASE_TABLET | Freq: Every day | ORAL | Status: DC
Start: 2024-03-13 — End: 2024-03-22
  Administered 2024-03-13 – 2024-03-22 (×10): 40 mg via ORAL
  Filled 2024-03-12 (×11): qty 1

## 2024-03-12 MED ORDER — ACETAMINOPHEN 10 MG/ML IV SOLN
INTRAVENOUS | Status: AC
  Filled 2024-03-12: qty 100

## 2024-03-12 MED ORDER — SODIUM CHLORIDE 0.9 % IV SOLN
250.0000 mL | INTRAVENOUS | Status: AC
  Administered 2024-03-12: 250 mL via INTRAVENOUS

## 2024-03-12 MED ORDER — OXYCODONE HCL 5 MG PO TABS
10.0000 mg | ORAL_TABLET | ORAL | Status: DC | PRN
  Administered 2024-03-13 – 2024-03-16 (×4): 10 mg via ORAL
  Filled 2024-03-12 (×5): qty 2

## 2024-03-12 MED ORDER — BISACODYL 10 MG RE SUPP: 10.0000 mg | Freq: Every day | RECTAL | Status: DC | PRN

## 2024-03-12 MED ORDER — SODIUM CHLORIDE 0.9 % IV SOLN: INTRAVENOUS | Status: DC | PRN

## 2024-03-12 MED ORDER — THROMBIN 5000 UNITS EX KIT
PACK | CUTANEOUS | Status: AC
  Filled 2024-03-12: qty 1

## 2024-03-12 MED ORDER — SODIUM CHLORIDE 0.9% FLUSH
3.0000 mL | Freq: Two times a day (BID) | INTRAVENOUS | Status: DC
  Administered 2024-03-12 – 2024-03-21 (×14): 3 mL via INTRAVENOUS

## 2024-03-12 MED ORDER — OXYCODONE HCL 5 MG/5ML PO SOLN: 5.0000 mg | Freq: Once | ORAL | Status: AC | PRN

## 2024-03-12 MED ORDER — MIDAZOLAM HCL 2 MG/2ML IJ SOLN
INTRAMUSCULAR | Status: DC | PRN
  Administered 2024-03-12: 2 mg via INTRAVENOUS

## 2024-03-12 MED ORDER — SOLRIAMFETOL HCL 150 MG PO TABS: 150.0000 mg | ORAL_TABLET | Freq: Every day | ORAL | Status: DC

## 2024-03-12 MED ORDER — DEXAMETHASONE SODIUM PHOSPHATE 10 MG/ML IJ SOLN
INTRAMUSCULAR | Status: DC | PRN
  Administered 2024-03-12: 10 mg via INTRAVENOUS

## 2024-03-12 MED ORDER — CYCLOBENZAPRINE HCL 10 MG PO TABS
ORAL_TABLET | ORAL | Status: AC
  Filled 2024-03-12: qty 1

## 2024-03-12 MED ORDER — MIDAZOLAM HCL 2 MG/2ML IJ SOLN: 0.5000 mg | Freq: Once | INTRAMUSCULAR | Status: DC | PRN

## 2024-03-12 MED ORDER — LOSARTAN POTASSIUM 50 MG PO TABS
50.0000 mg | ORAL_TABLET | Freq: Every day | ORAL | Status: DC
  Administered 2024-03-12 – 2024-03-16 (×5): 50 mg via ORAL
  Filled 2024-03-12 (×6): qty 1

## 2024-03-12 MED ORDER — CHLORHEXIDINE GLUCONATE CLOTH 2 % EX PADS: 6.0000 | MEDICATED_PAD | Freq: Once | CUTANEOUS | Status: DC

## 2024-03-12 MED ORDER — PHENYLEPHRINE 80 MCG/ML (10ML) SYRINGE FOR IV PUSH (FOR BLOOD PRESSURE SUPPORT)
PREFILLED_SYRINGE | INTRAVENOUS | Status: DC | PRN
  Administered 2024-03-12: 80 ug via INTRAVENOUS
  Administered 2024-03-12 (×4): 160 ug via INTRAVENOUS

## 2024-03-12 MED ORDER — VASHE WOUND IRRIGATION OPTIME
TOPICAL | Status: DC | PRN
  Administered 2024-03-12: 34 [oz_av] via TOPICAL

## 2024-03-12 MED ORDER — PROPOFOL 10 MG/ML IV BOLUS
INTRAVENOUS | Status: DC | PRN
Start: 2024-03-12 — End: 2024-03-12
  Administered 2024-03-12: 200 mg via INTRAVENOUS

## 2024-03-12 MED ORDER — BREXPIPRAZOLE 2 MG PO TABS
2.0000 mg | ORAL_TABLET | Freq: Every day | ORAL | Status: DC
  Administered 2024-03-13 – 2024-03-21 (×8): 2 mg via ORAL
  Filled 2024-03-12 (×10): qty 1

## 2024-03-12 MED ORDER — ROSUVASTATIN CALCIUM 20 MG PO TABS: 40.0000 mg | ORAL_TABLET | Freq: Every day | ORAL | Status: DC

## 2024-03-12 MED ORDER — BACITRACIN ZINC 500 UNIT/GM EX OINT
TOPICAL_OINTMENT | CUTANEOUS | Status: AC
  Filled 2024-03-12: qty 28.35

## 2024-03-12 MED ORDER — FERROUS SULFATE 325 (65 FE) MG PO TABS
325.0000 mg | ORAL_TABLET | Freq: Every day | ORAL | Status: DC
  Administered 2024-03-12 – 2024-03-21 (×9): 325 mg via ORAL
  Filled 2024-03-12 (×11): qty 1

## 2024-03-12 MED ORDER — LACTATED RINGERS IV SOLN: INTRAVENOUS | Status: DC

## 2024-03-12 MED ORDER — DOCUSATE SODIUM 100 MG PO CAPS
100.0000 mg | ORAL_CAPSULE | Freq: Two times a day (BID) | ORAL | Status: DC
  Administered 2024-03-12 – 2024-03-18 (×10): 100 mg via ORAL
  Filled 2024-03-12 (×12): qty 1

## 2024-03-12 MED ORDER — HYDROMORPHONE HCL 1 MG/ML IJ SOLN
0.2500 mg | INTRAMUSCULAR | Status: DC | PRN
Start: 2024-03-12 — End: 2024-03-12
  Administered 2024-03-12: 0.5 mg via INTRAVENOUS

## 2024-03-12 MED ORDER — ROSUVASTATIN CALCIUM 20 MG PO TABS
40.0000 mg | ORAL_TABLET | Freq: Every day | ORAL | Status: DC
  Administered 2024-03-12 – 2024-03-17 (×6): 40 mg via ORAL
  Filled 2024-03-12 (×6): qty 2
  Filled 2024-03-12: qty 8

## 2024-03-12 MED ORDER — ONDANSETRON HCL 4 MG/2ML IJ SOLN
INTRAMUSCULAR | Status: DC | PRN
  Administered 2024-03-12: 4 mg via INTRAVENOUS

## 2024-03-12 MED ORDER — ONDANSETRON HCL 4 MG/2ML IJ SOLN: 4.0000 mg | Freq: Four times a day (QID) | INTRAMUSCULAR | Status: DC | PRN

## 2024-03-12 MED ORDER — PROPOFOL 10 MG/ML IV BOLUS
INTRAVENOUS | Status: AC
  Filled 2024-03-12: qty 20

## 2024-03-12 MED ORDER — CEFAZOLIN SODIUM-DEXTROSE 3-4 GM/150ML-% IV SOLN
3.0000 g | Freq: Three times a day (TID) | INTRAVENOUS | Status: AC
  Administered 2024-03-12 (×2): 3 g via INTRAVENOUS
  Filled 2024-03-12 (×2): qty 150

## 2024-03-12 MED ORDER — LIDOCAINE 2% (20 MG/ML) 5 ML SYRINGE
INTRAMUSCULAR | Status: DC | PRN
  Administered 2024-03-12: 40 mg via INTRAVENOUS

## 2024-03-12 MED ORDER — VALACYCLOVIR HCL 500 MG PO TABS
500.0000 mg | ORAL_TABLET | Freq: Every day | ORAL | Status: DC
  Administered 2024-03-13 – 2024-03-22 (×10): 500 mg via ORAL
  Filled 2024-03-12 (×11): qty 1

## 2024-03-12 MED ORDER — CYCLOBENZAPRINE HCL 10 MG PO TABS
10.0000 mg | ORAL_TABLET | Freq: Three times a day (TID) | ORAL | Status: DC | PRN
  Administered 2024-03-12 – 2024-03-14 (×3): 10 mg via ORAL
  Filled 2024-03-12 (×3): qty 1

## 2024-03-12 MED ORDER — FENTANYL CITRATE (PF) 250 MCG/5ML IJ SOLN
INTRAMUSCULAR | Status: AC
  Filled 2024-03-12: qty 5

## 2024-03-12 MED ORDER — ACETAMINOPHEN 500 MG PO TABS
1000.0000 mg | ORAL_TABLET | Freq: Four times a day (QID) | ORAL | Status: AC
  Administered 2024-03-12 – 2024-03-13 (×4): 1000 mg via ORAL
  Filled 2024-03-12 (×4): qty 2

## 2024-03-12 MED ORDER — CEFAZOLIN SODIUM-DEXTROSE 3-4 GM/150ML-% IV SOLN
3.0000 g | INTRAVENOUS | Status: AC
  Administered 2024-03-12: 3 g via INTRAVENOUS
  Filled 2024-03-12: qty 150

## 2024-03-12 MED ORDER — PREGABALIN 25 MG PO CAPS
150.0000 mg | ORAL_CAPSULE | Freq: Two times a day (BID) | ORAL | Status: DC
  Administered 2024-03-12 – 2024-03-18 (×12): 150 mg via ORAL
  Filled 2024-03-12 (×13): qty 2

## 2024-03-12 MED ORDER — ACETAMINOPHEN 325 MG PO TABS
650.0000 mg | ORAL_TABLET | ORAL | Status: DC | PRN
  Administered 2024-03-14 – 2024-03-16 (×4): 650 mg via ORAL
  Filled 2024-03-12 (×4): qty 2

## 2024-03-12 MED ORDER — DEXMEDETOMIDINE HCL IN NACL 80 MCG/20ML IV SOLN
INTRAVENOUS | Status: DC | PRN
  Administered 2024-03-12 (×3): 4 ug via INTRAVENOUS

## 2024-03-12 MED ORDER — SUGAMMADEX SODIUM 200 MG/2ML IV SOLN
INTRAVENOUS | Status: DC | PRN
  Administered 2024-03-12: 100 mg via INTRAVENOUS
  Administered 2024-03-12: 300 mg via INTRAVENOUS

## 2024-03-12 MED ORDER — ALBUMIN HUMAN 5 % IV SOLN
INTRAVENOUS | Status: DC | PRN
Start: 2024-03-12 — End: 2024-03-12

## 2024-03-12 MED ORDER — BUPIVACAINE-EPINEPHRINE (PF) 0.5% -1:200000 IJ SOLN
INTRAMUSCULAR | Status: DC | PRN
  Administered 2024-03-12: 10 mL

## 2024-03-12 MED ORDER — 0.9 % SODIUM CHLORIDE (POUR BTL) OPTIME
TOPICAL | Status: DC | PRN
  Administered 2024-03-12: 1000 mL

## 2024-03-12 MED ORDER — PHENOL 1.4 % MT LIQD: 1.0000 | OROMUCOSAL | Status: DC | PRN

## 2024-03-12 MED ORDER — BUPIVACAINE LIPOSOME 1.3 % IJ SUSP
INTRAMUSCULAR | Status: AC
  Filled 2024-03-12: qty 20

## 2024-03-12 MED ORDER — MEPERIDINE HCL 25 MG/ML IJ SOLN: 6.2500 mg | INTRAMUSCULAR | Status: DC | PRN

## 2024-03-12 MED ORDER — MORPHINE SULFATE (PF) 2 MG/ML IV SOLN
2.0000 mg | INTRAVENOUS | Status: DC | PRN
  Administered 2024-03-12: 2 mg via INTRAVENOUS
  Filled 2024-03-12: qty 2

## 2024-03-12 MED ORDER — BUPIVACAINE-EPINEPHRINE (PF) 0.5% -1:200000 IJ SOLN
INTRAMUSCULAR | Status: AC
  Filled 2024-03-12: qty 30

## 2024-03-12 MED ORDER — ORAL CARE MOUTH RINSE: 15.0000 mL | Freq: Once | OROMUCOSAL | Status: AC

## 2024-03-12 MED ORDER — CLONAZEPAM 0.5 MG PO TABS
1.0000 mg | ORAL_TABLET | Freq: Two times a day (BID) | ORAL | Status: DC
  Administered 2024-03-12 – 2024-03-18 (×12): 1 mg via ORAL
  Filled 2024-03-12 (×13): qty 2

## 2024-03-12 MED ORDER — HYDROMORPHONE HCL 1 MG/ML IJ SOLN
INTRAMUSCULAR | Status: AC
  Administered 2024-03-12: 0.5 mg via INTRAVENOUS
  Filled 2024-03-12: qty 1

## 2024-03-12 MED ORDER — VASOPRESSIN 20 UNIT/ML IV SOLN
INTRAVENOUS | Status: AC
  Filled 2024-03-12: qty 1

## 2024-03-12 MED ORDER — ONDANSETRON HCL 4 MG PO TABS: 4.0000 mg | ORAL_TABLET | Freq: Four times a day (QID) | ORAL | Status: DC | PRN

## 2024-03-12 MED ORDER — SODIUM CHLORIDE 0.9% FLUSH: 3.0000 mL | INTRAVENOUS | Status: DC | PRN

## 2024-03-12 MED ORDER — OXYCODONE HCL 5 MG PO TABS
5.0000 mg | ORAL_TABLET | Freq: Once | ORAL | Status: AC | PRN
  Administered 2024-03-12: 5 mg via ORAL

## 2024-03-12 MED ORDER — MENTHOL 3 MG MT LOZG: 1.0000 | LOZENGE | OROMUCOSAL | Status: DC | PRN

## 2024-03-12 MED ORDER — ACETAMINOPHEN 500 MG PO TABS
1000.0000 mg | ORAL_TABLET | Freq: Once | ORAL | Status: AC
  Administered 2024-03-12: 1000 mg via ORAL
  Filled 2024-03-12: qty 2

## 2024-03-12 MED ORDER — PHENYLEPHRINE HCL-NACL 20-0.9 MG/250ML-% IV SOLN
INTRAVENOUS | Status: DC | PRN
  Administered 2024-03-12: 40 ug/min via INTRAVENOUS

## 2024-03-12 MED ORDER — HYDROMORPHONE HCL 1 MG/ML IJ SOLN
INTRAMUSCULAR | Status: DC | PRN
  Administered 2024-03-12: .3 mg via INTRAVENOUS
  Administered 2024-03-12: .2 mg via INTRAVENOUS

## 2024-03-12 SURGICAL SUPPLY — 63 items
BAG COUNTER SPONGE SURGICOUNT (BAG) ×1 IMPLANT
BASKET BONE COLLECTION (BASKET) ×1 IMPLANT
BENZOIN TINCTURE PRP APPL 2/3 (GAUZE/BANDAGES/DRESSINGS) ×1 IMPLANT
BLADE CLIPPER SURG (BLADE) IMPLANT
BUR MATCHSTICK NEURO 3.0 LAGG (BURR) ×1 IMPLANT
BUR PRECISION FLUTE 6.0 (BURR) ×1 IMPLANT
CAGE ALTERA 10X31X9-13 15D (Cage) IMPLANT
CANISTER SUCT 3000ML PPV (MISCELLANEOUS) ×1 IMPLANT
CAP LOCK DLX THRD (Cap) IMPLANT
CLEANSER WND VASHE INSTL 34OZ (WOUND CARE) ×1 IMPLANT
CNTNR URN SCR LID CUP LEK RST (MISCELLANEOUS) ×1 IMPLANT
COVER BACK TABLE 60X90IN (DRAPES) ×1 IMPLANT
DRAPE C-ARM 42X72 X-RAY (DRAPES) ×2 IMPLANT
DRAPE HALF SHEET 40X57 (DRAPES) ×1 IMPLANT
DRAPE LAPAROTOMY 100X72X124 (DRAPES) ×1 IMPLANT
DRAPE SHEET LG 3/4 BI-LAMINATE (DRAPES) IMPLANT
DRAPE SURG 17X23 STRL (DRAPES) ×1 IMPLANT
DRSG OPSITE POSTOP 4X6 (GAUZE/BANDAGES/DRESSINGS) ×1 IMPLANT
DRSG TEGADERM 4X4.75 (GAUZE/BANDAGES/DRESSINGS) IMPLANT
ELECT BLADE 4.0 EZ CLEAN MEGAD (MISCELLANEOUS) ×1 IMPLANT
ELECT REM PT RETURN 9FT ADLT (ELECTROSURGICAL) ×1 IMPLANT
ELECTRODE BLDE 4.0 EZ CLN MEGD (MISCELLANEOUS) ×1 IMPLANT
ELECTRODE REM PT RTRN 9FT ADLT (ELECTROSURGICAL) ×1 IMPLANT
EVACUATOR 1/8 PVC DRAIN (DRAIN) IMPLANT
GAUZE 4X4 16PLY ~~LOC~~+RFID DBL (SPONGE) ×1 IMPLANT
GLOVE BIO SURGEON STRL SZ 6 (GLOVE) ×1 IMPLANT
GLOVE BIO SURGEON STRL SZ8 (GLOVE) ×2 IMPLANT
GLOVE BIO SURGEON STRL SZ8.5 (GLOVE) ×2 IMPLANT
GLOVE BIOGEL PI IND STRL 6.5 (GLOVE) ×1 IMPLANT
GLOVE EXAM NITRILE XL STR (GLOVE) IMPLANT
GOWN STRL REUS W/ TWL LRG LVL3 (GOWN DISPOSABLE) ×1 IMPLANT
GOWN STRL REUS W/ TWL XL LVL3 (GOWN DISPOSABLE) ×2 IMPLANT
GOWN STRL REUS W/TWL 2XL LVL3 (GOWN DISPOSABLE) IMPLANT
HEMOSTAT POWDER KIT SURGIFOAM (HEMOSTASIS) ×1 IMPLANT
KIT BASIN OR (CUSTOM PROCEDURE TRAY) ×1 IMPLANT
KIT GRAFTMAG DEL NEURO DISP (NEUROSURGERY SUPPLIES) IMPLANT
KIT POSITION SURG JACKSON T1 (MISCELLANEOUS) ×1 IMPLANT
KIT TURNOVER KIT B (KITS) ×1 IMPLANT
NDL HYPO 21X1.5 SAFETY (NEEDLE) ×1 IMPLANT
NDL HYPO 22X1.5 SAFETY MO (MISCELLANEOUS) ×1 IMPLANT
NEEDLE HYPO 21X1.5 SAFETY (NEEDLE) ×1 IMPLANT
NEEDLE HYPO 22X1.5 SAFETY MO (MISCELLANEOUS) ×1 IMPLANT
NS IRRIG 1000ML POUR BTL (IV SOLUTION) ×1 IMPLANT
PACK LAMINECTOMY NEURO (CUSTOM PROCEDURE TRAY) ×1 IMPLANT
PAD ARMBOARD POSITIONER FOAM (MISCELLANEOUS) ×3 IMPLANT
PATTIES SURGICAL .5 X1 (DISPOSABLE) IMPLANT
PUTTY DBM 5CC CALC GRAN (Putty) IMPLANT
ROD CREO DLX CVD 6.35X40 (Rod) IMPLANT
SCREW PA DLX CREO 7.5X50 (Screw) IMPLANT
SCREW PA DLX CREO 7.5X55 (Screw) IMPLANT
SPIKE FLUID TRANSFER (MISCELLANEOUS) ×1 IMPLANT
SPONGE NEURO XRAY DETECT 1X3 (DISPOSABLE) IMPLANT
SPONGE SURGIFOAM ABS GEL 100 (HEMOSTASIS) IMPLANT
SPONGE T-LAP 4X18 ~~LOC~~+RFID (SPONGE) IMPLANT
STRIP CLOSURE SKIN 1/2X4 (GAUZE/BANDAGES/DRESSINGS) ×1 IMPLANT
SUT PROLENE 6 0 BV (SUTURE) IMPLANT
SUT VIC AB 1 CT1 18XBRD ANBCTR (SUTURE) ×2 IMPLANT
SUT VIC AB 2-0 CP2 18 (SUTURE) ×2 IMPLANT
SYR 20ML LL LF (SYRINGE) IMPLANT
TOWEL GREEN STERILE (TOWEL DISPOSABLE) ×1 IMPLANT
TOWEL GREEN STERILE FF (TOWEL DISPOSABLE) ×1 IMPLANT
TRAY FOLEY MTR SLVR 16FR STAT (SET/KITS/TRAYS/PACK) ×1 IMPLANT
WATER STERILE IRR 1000ML POUR (IV SOLUTION) ×1 IMPLANT

## 2024-03-12 NOTE — Plan of Care (Signed)

## 2024-03-12 NOTE — Anesthesia Postprocedure Evaluation (Signed)
 Anesthesia Post Note  Patient: Tony Campos  Procedure(s) Performed: LUMBAR FOUR-FIVE POSTERIOR LUMBAR INTERBODY FUSION     Patient location during evaluation: PACU Anesthesia Type: General Level of consciousness: awake and alert, patient cooperative and oriented Pain management: pain level controlled Vital Signs Assessment: post-procedure vital signs reviewed and stable Respiratory status: spontaneous breathing, nonlabored ventilation, respiratory function stable and patient connected to nasal cannula oxygen Cardiovascular status: blood pressure returned to baseline and stable Postop Assessment: no apparent nausea or vomiting Anesthetic complications: no   No notable events documented.  Last Vitals:  Vitals:   03/12/24 1415 03/12/24 1503  BP: (!) 173/152 (!) 150/94  Pulse: 97 92  Resp: 14 16  Temp:  36.8 C  SpO2: 92% 93%    Last Pain:  Vitals:   03/12/24 1400  TempSrc:   PainSc: 6                  Octivia Canion,E. Rolland Steinert

## 2024-03-12 NOTE — Transfer of Care (Signed)
 Immediate Anesthesia Transfer of Care Note  Patient: Tony Campos  Procedure(s) Performed: LUMBAR FOUR-FIVE POSTERIOR LUMBAR INTERBODY FUSION  Patient Location: PACU  Anesthesia Type:General  Level of Consciousness: awake and alert   Airway & Oxygen Therapy: Patient Spontanous Breathing and Patient connected to face mask oxygen  Post-op Assessment: Report given to RN and Post -op Vital signs reviewed and stable  Post vital signs: Reviewed and stable  Last Vitals:  Vitals Value Taken Time  BP 142/76 03/12/24 1258  Temp    Pulse 93 03/12/24 1301  Resp 13 03/12/24 1301  SpO2 92 % 03/12/24 1301  Vitals shown include unfiled device data.  Last Pain:  Vitals:   03/12/24 0705  TempSrc:   PainSc: 8       Patients Stated Pain Goal: 0 (03/12/24 0655)  Complications: No notable events documented.

## 2024-03-12 NOTE — Progress Notes (Signed)
 Orthopedic Tech Progress Note Patient Details:  Tony Campos 01/06/1966 [id number]  Patient has back brace   Patient ID: Tony Campos, male   DOB: 06-04-1966, 58 y.o.   MRN: [id number]  Tony Campos 03/12/2024, 3:45 PM

## 2024-03-12 NOTE — Anesthesia Procedure Notes (Signed)
 Procedure Name: Intubation Date/Time: 03/12/2024 8:40 AM  Performed by: Candance Certain, CRNAPre-anesthesia Checklist: Patient identified, Emergency Drugs available, Suction available and Patient being monitored Patient Re-evaluated:Patient Re-evaluated prior to induction Oxygen Delivery Method: Circle System Utilized Preoxygenation: Pre-oxygenation with 100% oxygen Induction Type: IV induction Ventilation: Two handed mask ventilation required Laryngoscope Size: Glidescope and 3 Grade View: Grade I Tube type: Oral Tube size: 7.5 mm Number of attempts: 1 Airway Equipment and Method: Rigid stylet, Video-laryngoscopy and Oral airway Placement Confirmation: ETT inserted through vocal cords under direct vision, positive ETCO2 and breath sounds checked- equal and bilateral Secured at: 22 cm Tube secured with: Tape Dental Injury: Teeth and Oropharynx as per pre-operative assessment

## 2024-03-12 NOTE — H&P (Signed)
 Subjective: The patient is a 58 year old white male whose had previous back surgery.  He developed recurrent back and left leg pain consistent with a lumbar radiculopathy.  He has failed medical management and was worked up with a lumbar MRI which demonstrated a recurrent herniated disc at L4-5 on the left.  I discussed the various treatment options with him.  He has decided proceed with surgery.  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 OR;  Service: ENT;  Laterality: N/A;   COLONOSCOPY  07/26/2020   10/22/2018   DRUG INDUCED ENDOSCOPY N/A 04/06/2021   Procedure: DRUG INDUCED ENDOSCOPY;  Surgeon: Christia Reading, MD;  Location: [facility];  Service: ENT;  Laterality: N/A;   IMPLANTATION OF HYPOGLOSSAL NERVE STIMULATOR Right 07/20/2021   Procedure: IMPLANTATION OF HYPOGLOSSAL NERVE STIMULATOR;  Surgeon: Christia Reading, MD;  Location: [facility];  Service: ENT;  Laterality: Right;   KNEE SURGERY     3 on right knee and 1 on left knee   LAPAROSCOPIC APPENDECTOMY N/A 07/25/2018   Procedure: APPENDECTOMY LAPAROSCOPIC;  Surgeon: Franky Macho, MD;  Location: [facility]  ORS;  Service: General;  Laterality: N/A;   MANDIBULAR HARDWARE REMOVAL N/A 05/06/2018   Procedure: REMOVAL OF MMF HARDWARE;  Surgeon: Osborn Coho, MD;  Location: [facility];  Service: ENT;  Laterality: N/A;   NASAL SEPTUM SURGERY     NASAL SINUS SURGERY     POLYPECTOMY     right sfa recanalization and stenting  Right 01/12/2016   [facility]   UPPER GASTROINTESTINAL ENDOSCOPY  07/26/2020   WRIST SURGERY     right and left    No Known Allergies  Social History   Tobacco Use   Smoking status: Every Day    Current packs/day: 0.25    Average packs/day: 0.3 packs/day    Types: Cigarettes    Start date: 02/2024    Passive exposure: Current   Smokeless tobacco: Never   Tobacco comments:    Less than 1/2 pack per day 01/22/23 PAP  Substance Use Topics   Alcohol use: No    Family History  Problem Relation Age of Onset   Other Father        killed in [place]   Cancer Maternal Grandfather        stomach   Stomach cancer Maternal Grandfather    Colon cancer Neg Hx    Colitis Neg Hx    Esophageal cancer Neg Hx    Rectal cancer Neg Hx  Prior to Admission medications   Medication Sig Start Date End Date Taking? Authorizing Provider  Alpha-Lipoic Acid 600 MG CAPS Take 1 capsule by mouth 2 (two) times daily. Takes with Lyrica   Yes [provider]  aspirin EC 81 MG tablet Take 81 mg by mouth daily.   Yes [provider]  baclofen (LIORESAL) 10 MG tablet Take 10 mg by mouth 3 (three) times daily.   Yes [provider]  brexpiprazole (REXULTI) 2 MG TABS tablet Take 2 mg by mouth daily. 02/06/22  Yes [provider]  clonazePAM (KLONOPIN) 1 MG tablet Take 1 mg by mouth 2 (two) times daily.   Yes [provider]  DULoxetine (CYMBALTA) 60 MG capsule Take 60 mg by mouth daily. 07/09/20  Yes [provider]  ferrous sulfate 325 (65 FE) MG tablet Take 325 mg by mouth daily.   Yes [provider]  lamoTRIgine  (LAMICTAL) 200 MG tablet Take 200 mg by mouth 2 (two) times daily. 07/20/20  Yes [provider]  levothyroxine (SYNTHROID) 125 MCG tablet take 1 tablet (125 microgram total) by mouth daily before breakfast. 01/10/24  Yes Pickard, Cisco Crest, MD  losartan (COZAAR) 50 MG tablet take 1 tablet (50 milligram total) by mouth daily. 01/10/24  Yes Austine Lefort, MD  metoprolol tartrate (LOPRESSOR) 25 MG tablet take (1) tablet twice daily. 01/10/24  Yes Austine Lefort, MD  morphine (MS CONTIN) 15 MG 12 hr tablet Take 15 mg by mouth 3 (three) times daily. 11/24/21  Yes [provider]  NARCAN 4 MG/0.1ML LIQD nasal spray kit Place 1 spray into the nose once. 09/08/20  Yes [provider]  pantoprazole (PROTONIX) 40 MG tablet TAKE (1) TABLET TWICE DAILY. 09/28/23  Yes Austine Lefort, MD  pregabalin (LYRICA) 150 MG capsule Take 150 mg by mouth 2 (two) times daily. 03/02/21  Yes [provider]  rosuvastatin (CRESTOR) 40 MG tablet Take 1 tablet (40 mg total) by mouth daily. 03/08/23  Yes Austine Lefort, MD  Solriamfetol HCl (SUNOSI) 150 MG TABS Take 150 mg by mouth daily.   Yes [provider]  valACYclovir (VALTREX) 500 MG tablet take 1 tablet (500 milligram total) by mouth daily. 01/17/24  Yes Austine Lefort, MD  vitamin B-12 (CYANOCOBALAMIN) 500 MCG tablet Take 500 mcg by mouth daily.   Yes [provider]  Vitamin E 180 MG (400 UNIT) CAPS Take 400 Units by mouth daily.   Yes [provider]  esomeprazole (NEXIUM) 40 MG capsule Take 40 mg by mouth daily before breakfast.  03/19/13  [provider]     Review of Systems  Positive ROS: As above  All other systems have been reviewed and were otherwise negative with the exception of those mentioned in the HPI and as above.  Objective: Vital signs in last 24 hours: Temp:  [98.5 F (36.9 C)] 98.5 F (36.9 C) (04/16 0646) Pulse Rate:  [74] 74 (04/16 0646) Resp:  [18] 18 (04/16  0646) BP: (155-186)/(91-92) 155/91 (04/16 0701) SpO2:  [93 %-96 %] 96 % (04/16 0701) Weight:  [124.7 kg] 124.7 kg (04/16 0646) Estimated body mass index is 37.3 kg/m as calculated from the following:   Height as of this encounter: 6' (1.829 m).   Weight as of this encounter: 124.7 kg.   General Appearance: Alert, uncomfortable Head: Normocephalic, without obvious abnormality, atraumatic Eyes: PERRL, conjunctiva/corneas clear, EOM's intact,    Ears: Normal  Throat: Normal  Neck: Supple,  Back: His lumbar incision is well-healed.  Lungs: Clear to auscultation bilaterally, respirations unlabored Heart: Regular rate and rhythm, no murmur, rub or gallop Abdomen: Soft, non-tender Extremities: Extremities normal, atraumatic, no cyanosis or edema Skin: unremarkable  NEUROLOGIC:   Mental status: alert and oriented,Motor Exam - grossly normal Sensory Exam - grossly normal Reflexes:  Coordination - grossly normal Gait - grossly normal Balance - grossly normal Cranial Nerves: I: smell Not tested  II: visual acuity  OS: Normal  OD: Normal   II: visual fields Full to confrontation  II: pupils Equal, round, reactive to light  III,VII: ptosis None  III,IV,VI: extraocular muscles  Full ROM  V: mastication Normal  V: facial light touch sensation  Normal  V,VII: corneal reflex  Present  VII: facial muscle function - upper  Normal  VII: facial muscle function - lower Normal  VIII: hearing Not tested  IX: soft palate elevation  Normal  IX,X: gag reflex Present  XI: trapezius strength  5/5  XI: sternocleidomastoid strength 5/5  XI: neck flexion strength  5/5  XII: tongue strength  Normal    Data Review Lab Results  Component Value Date   WBC 8.4 03/04/2024   HGB 12.4 (L) 03/04/2024   HCT 36.8 (L) 03/04/2024   MCV 89.3 03/04/2024   PLT 194 03/04/2024   Lab Results  Component Value Date   NA 144 03/06/2024   K 4.7 03/06/2024   CL 101 03/06/2024   CO2 34 (H) 03/06/2024   BUN 8  03/06/2024   CREATININE 1.07 03/06/2024   GLUCOSE 111 (H) 03/06/2024   Lab Results  Component Value Date   INR 1.0 05/24/2018    Assessment/Plan: Lumbar degenerative disease, recurrent lumbar disc, lumbar radiculopathy, lumbago: I have discussed the situation with the patient and his wife.  I reviewed his imaging studies with him and pointed out the abnormalities.  We have discussed the various treatment options including surgery.  I have described the surgical treatment option of an L4-5 decompression, instrumentation and fusion.  I have shown him surgical models.  I have given him a surgical pamphlet.  We have discussed the risk, benefits, alternatives, expected postoperative course, and likelihood of achieving our goals with surgery.  I have answered all his questions.  He has decided proceed with surgery.   [name] 03/12/2024 8:12 AM

## 2024-03-12 NOTE — Op Note (Signed)
 Brief history: The patient is a 58 year old white male whose had a previous L4-5 discectomy.  He has developed recurrent back and leg pain.  He has failed medical management.  He is worked up with lumbar x-rays and lumbar MRI which demonstrated an L4-5 spondylolisthesis with recurrent herniated disc.  I discussed the various treatment options with him.  He has decided proceed with surgery.  Preoperative diagnosis: L4-5 spondylolisthesis, degenerative disc disease, recurrent herniated disc; lumbago; lumbar radiculopathy; neurogenic claudication  Postoperative diagnosis: The same  Procedure: Bilateral L4-5 laminotomy/foraminotomies/medial facetectomy to decompress the bilateral L4 and L5 nerve roots(the work required to do this was in addition to the work required to do the posterior lumbar interbody fusion because of the patient's spinal stenosis, facet arthropathy. Etc. requiring a wide decompression of the nerve roots.); redo L4-5 discectomy; left L4-5 transforaminal lumbar interbody fusion with local morselized autograft bone and Zimmer DBM; insertion of interbody prosthesis at L4-5 (globus peek expandable interbody prosthesis); posterior nonsegmental instrumentation from L4 to L5 with globus titanium pedicle screws and rods; posterior lateral arthrodesis at L4-5 with local morselized autograft bone and Zimmer DBM.  Surgeon: Dr. Delma Officer  Asst.: Hildred Priest, NP  Anesthesia: Gen. endotracheal  Estimated blood loss: 200 cc  Drains: 1 medium Hemovac drain in the epidural space  Complications: None  Description of procedure: The patient was brought to the operating room by the anesthesia team. General endotracheal anesthesia was induced. The patient was turned to the prone position on the Wilson frame. The patient's lumbosacral region was then prepared with Betadine scrub and Betadine solution. Sterile drapes were applied.  I then injected the area to be incised with Marcaine with  epinephrine solution. I then used the scalpel to make a linear midline incision over the L4-5 interspace, incising through his old surgical scar. I then used electrocautery to perform a bilateral subperiosteal dissection exposing the spinous process and lamina of L4 and L5 bilaterally. We then obtained intraoperative radiograph to confirm our location. We then inserted the Verstrac retractor to provide exposure.  I began the decompression by using the high speed drill to perform a redo left L4-5 laminotomy and a right L4-5 laminotomy. We then used the Kerrison punches to remove the epidural scar tissue at L4-5 on the left widen the laminotomy and removed the ligamentum flavum at L4-5 bilaterally. We used the Kerrison punches to remove the medial facets at 4 5 bilaterally, we removed the left L4-5 facet. We performed wide foraminotomies about the bilateral L4 and L5 nerve roots completing the decompression.  We now turned our attention to the posterior lumbar interbody fusion.  As expected we encountered a recurrent herniated disc at L4-5 on the left.  I used a scalpel to incise the intervertebral disc at L4-5 bilaterally. I then performed a partial intervertebral discectomy at 4 5 bilaterally using the pituitary forceps. We prepared the vertebral endplates at L4-5 bilaterally for the fusion by removing the soft tissues with the curettes. We then used the trial spacers to pick the appropriate sized interbody prosthesis. We prefilled his prosthesis with a combination of local morselized autograft bone that we obtained during the decompression as well as Zimmer DBM. We inserted the prefilled prosthesis into the interspace at L4-5 from the left, we then turned and expanded the prosthesis. There was a good snug fit of the prosthesis in the interspace. We then filled and the remainder of the intervertebral disc space with local morselized autograft bone and Zimmer DBM. This completed the  posterior lumbar interbody  arthrodesis.  During the decompression and insertion of the prosthesis the assistant protected the thecal sac and nerve roots with the D'Errico retractor.  We now turned attention to the instrumentation. Under fluoroscopic guidance we cannulated the bilateral L4 and L5 pedicles with the bone probe. We then removed the bone probe. We then tapped the pedicle with a 6.5 millimeter tap. We then removed the tap. We probed inside the tapped pedicle with a ball probe to rule out cortical breaches. We then inserted a 7.5 x 50 and 55 millimeter pedicle screw into the L4 and L5 pedicles bilaterally under fluoroscopic guidance. We then palpated along the medial aspect of the pedicles to rule out cortical breaches. There were none. The nerve roots were not injured. We then connected the unilateral pedicle screws with a lordotic rod. We compressed the construct and secured the rod in place with the caps. We then tightened the caps appropriately. This completed the instrumentation from L4-5 bilaterally.  We now turned our attention to the posterior lateral arthrodesis at L4-5. We used the high-speed drill to decorticate the remainder of the facets, pars, transverse process at L4-5. We then applied a combination of local morselized autograft bone and Zimmer DBM over these decorticated posterior lateral structures. This completed the posterior lateral arthrodesis.  We then obtained hemostasis using bipolar electrocautery. We irrigated the wound out with vashe solution. We inspected the thecal sac and nerve roots and noted they were well decompressed. We then removed the retractor.  We injected Exparel .  We placed a medium Hemovac drain in the epidural space and tunneled it out through a separate stab wound.  We reapproximated patient's thoracolumbar fascia with interrupted #1 Vicryl suture. We reapproximated patient's subcutaneous tissue with interrupted 2-0 Vicryl suture. The reapproximated patient's skin with Steri-Strips  and benzoin. The wound was then coated with bacitracin ointment. A sterile dressing was applied. The drapes were removed. The patient was subsequently returned to the supine position where they were extubated by the anesthesia team. He was then transported to the post anesthesia care unit in stable condition. All sponge instrument and needle counts were reportedly correct at the end of this case.

## 2024-03-12 NOTE — Progress Notes (Signed)
 PT Cancellation Note  Patient Details Name: Tony Campos MRN: [id number] DOB: 1965-12-12   Cancelled Treatment:    Reason Eval/Treat Not Completed: Pain limiting ability to participate;Fatigue/lethargy limiting ability to participate. Pt has been up to the bathroom since arriving to unit, but states he isn't ready to mobilize again for PT evaluation. Will follow and evaluate as time/schedule allows.   Barnabas Booth, PT, DPT   Acute Rehabilitation Department Office [phone] Secure Chat Communication Preferred   Lona Rist 03/12/2024, 3:13 PM

## 2024-03-13 MED ORDER — CYCLOBENZAPRINE HCL 10 MG PO TABS: 10.0000 mg | ORAL_TABLET | Freq: Three times a day (TID) | ORAL | 0 refills | Status: AC | PRN

## 2024-03-13 MED ORDER — OXYCODONE-ACETAMINOPHEN 5-325 MG PO TABS: 1.0000 | ORAL_TABLET | Freq: Four times a day (QID) | ORAL | 0 refills | Status: AC | PRN

## 2024-03-13 MED ORDER — DOCUSATE SODIUM 100 MG PO CAPS: 100.0000 mg | ORAL_CAPSULE | Freq: Two times a day (BID) | ORAL | 0 refills | Status: AC

## 2024-03-13 MED FILL — Thrombin For Soln 5000 Unit: CUTANEOUS | Qty: 5000 | Status: AC

## 2024-03-13 NOTE — Discharge Instructions (Signed)

## 2024-03-13 NOTE — Evaluation (Signed)
 Physical Therapy Evaluation Patient Details Name: Tony Campos MRN: [id number] DOB: [birth year] Today's Date: 03/13/2024  History of Present Illness  Patient is 58 y.o. male s/p L4-5 discectomy on 03/12/24 due to spondylolisthesis and recurrent herniated disc. PMH significant for AAA (without rupture), GERD, anxiety, depression, bipolar disorder, HLD, HTN, hypothyroidism, meningitis, PAD, Rt knee surgery x3, Lt knee surgery x1, appendectomy, cholecystectomy.   Clinical Impression  Tony Campos is 58 y.o. male admitted with above HPI and diagnosis. Patient is currently limited by functional impairments below (see PT problem list). Patient lives with spouse and is independent with no AD at baseline. Pt educated on spinal precautions "BLT" and brace donning/doffing. Pt amb ~200' with RW, no overt LOB noted and no significant increase in pain with mobility. Pt educated on stair negotiation and completed curb step up with no overt LOB and safe management of RW. EOS pt agreeable to remain OOB in recliner, Alarm on and call bell within reach. Patient will benefit from continued skilled PT interventions to address impairments and progress independence with mobility. Acute PT will follow and progress as able.         If plan is discharge home, recommend the following: Assist for transportation;Help with stairs or ramp for entrance;A little help with bathing/dressing/bathroom;Assistance with cooking/housework   Can travel by private vehicle        Equipment Recommendations None recommended by PT  Recommendations for Other Services       Functional Status Assessment Patient has had a recent decline in their functional status and demonstrates the ability to make significant improvements in function in a reasonable and predictable amount of time.     Precautions / Restrictions Precautions Precautions: Fall;Back Precaution Booklet Issued: Yes (comment) Recall of Precautions/Restrictions:  Intact Restrictions Weight Bearing Restrictions Per Provider Order: No      Mobility  Bed Mobility Overal bed mobility: Needs Assistance Bed Mobility: Rolling, Sidelying to Sit Rolling: Supervision, Used rails Sidelying to sit: Supervision, Used rails       General bed mobility comments: cues for log roll technique wtih hand placement.    Transfers Overall transfer level: Needs assistance Equipment used: Rolling walker (2 wheels) Transfers: Sit to/from Stand Sit to Stand: From elevated surface, Contact guard assist           General transfer comment: CGA for safety, bed elevated slightly for pt height. cues for hands placement with power up.    Ambulation/Gait Ambulation/Gait assistance: Contact guard assist, Supervision Gait Distance (Feet): 200 Feet Assistive device: Rolling walker (2 wheels) Gait Pattern/deviations: Step-through pattern, Decreased stride length, Wide base of support Gait velocity: decr     General Gait Details: cues at start of walker position, pt maintained throughout.  Stairs Stairs: Yes Stairs assistance: Contact guard assist Stair Management: No rails, Forwards, With walker, Step to pattern Number of Stairs: 1 General stair comments: cues for sequencing RW use for curb negotiation. no overt LOB noted.  Wheelchair Mobility     Tilt Bed    Modified Rankin (Stroke Patients Only)       Balance Overall balance assessment: Mild deficits observed, not formally tested                                           Pertinent Vitals/Pain Pain Assessment Pain Assessment: Faces Faces Pain Scale: Hurts even more Pain Location: back Pain Descriptors /  Indicators: Aching, Discomfort Pain Intervention(s): Limited activity within patient's tolerance, Monitored during session, Premedicated before session, Repositioned    Home Living Family/patient expects to be discharged to:: Private residence Living Arrangements:  Spouse/significant other (spouses grandmother wiht them) Available Help at Discharge: Family Type of Home: House Home Access: Stairs to enter Entrance Stairs-Rails: None Secretary/administrator of Steps: 1+1   Home Layout: One level Home Equipment: Agricultural consultant (2 wheels)      Prior Function Prior Level of Function : Independent/Modified Independent;Driving                     Extremity/Trunk Assessment   Upper Extremity Assessment Upper Extremity Assessment: Overall WFL for tasks assessed    Lower Extremity Assessment Lower Extremity Assessment: Overall WFL for tasks assessed    Cervical / Trunk Assessment Cervical / Trunk Assessment: Back Surgery;Other exceptions Cervical / Trunk Exceptions: habitus  Communication   Communication Communication: No apparent difficulties    Cognition Arousal: Alert, Lethargic Behavior During Therapy: WFL for tasks assessed/performed   PT - Cognitive impairments: No apparent impairments                         Following commands: Intact       Cueing Cueing Techniques: Verbal cues     General Comments      Exercises     Assessment/Plan    PT Assessment Patient needs continued PT services  PT Problem List Decreased strength;Decreased range of motion;Decreased activity tolerance;Decreased balance;Decreased mobility;Decreased knowledge of use of DME;Decreased safety awareness;Decreased knowledge of precautions;Obesity;Pain       PT Treatment Interventions DME instruction;Gait training;Stair training;Functional mobility training;Therapeutic activities;Therapeutic exercise;Balance training;Neuromuscular re-education;Cognitive remediation;Patient/family education    PT Goals (Current goals can be found in the Care Plan section)  Acute Rehab PT Goals Patient Stated Goal: recover mobility, independence, and stop hurting PT Goal Formulation: With patient Time For Goal Achievement: 03/20/24 Potential to Achieve  Goals: Good    Frequency Min 5X/week     Co-evaluation               AM-PAC PT "6 Clicks" Mobility  Outcome Measure Help needed turning from your back to your side while in a flat bed without using bedrails?: A Little Help needed moving from lying on your back to sitting on the side of a flat bed without using bedrails?: A Little Help needed moving to and from a bed to a chair (including a wheelchair)?: A Little Help needed standing up from a chair using your arms (e.g., wheelchair or bedside chair)?: A Little Help needed to walk in hospital room?: A Little Help needed climbing 3-5 steps with a railing? : A Little 6 Click Score: 18    End of Session Equipment Utilized During Treatment: Gait belt;Back brace Activity Tolerance: Patient tolerated treatment well Patient left: in chair;with call bell/phone within reach;with chair alarm set;with family/visitor present Nurse Communication: Mobility status PT Visit Diagnosis: Unsteadiness on feet (R26.81);Muscle weakness (generalized) (M62.81);Difficulty in walking, not elsewhere classified (R26.2);Pain Pain - part of body:  (back)    Time: 2956-2130 PT Time Calculation (min) (ACUTE ONLY): 38 min   Charges:   PT Evaluation $PT Eval Low Complexity: 1 Low PT Treatments $Gait Training: 23-37 mins PT General Charges $$ ACUTE PT VISIT: 1 Visit         Wynn Maudlin, DPT Acute Rehabilitation Services Office [phone]  03/13/24 2:41 PM

## 2024-03-13 NOTE — Plan of Care (Signed)

## 2024-03-13 NOTE — Plan of Care (Signed)

## 2024-03-13 NOTE — Progress Notes (Signed)
 Subjective: The patient is alert and pleasant.  His back is appropriately sore.  He looks well.  He has not mobilized yet.  Objective: Vital signs in last 24 hours: Temp:  [97.7 F (36.5 C)-98.6 F (37 C)] 98 F (36.7 C) (04/17 0716) Pulse Rate:  [71-97] 71 (04/17 0716) Resp:  [9-21] 17 (04/17 0602) BP: (113-173)/(62-152) 139/76 (04/17 0716) SpO2:  [82 %-95 %] 93 % (04/17 0716) Estimated body mass index is 37.3 kg/m as calculated from the following:   Height as of this encounter: 6' (1.829 m).   Weight as of this encounter: 124.7 kg.   Intake/Output from previous day: 04/16 0701 - 04/17 0700 In: 4820 [P.O.:350; I.V.:1150; IV Piggyback:3320] Out: 1440 [Urine:1100; Drains:190; Blood:150] Intake/Output this shift: No intake/output data recorded.  Physical exam the patient is alert and oriented.  His strength is normal.  Lab Results: No results for input(s): "WBC", "HGB", "HCT", "PLT" in the last 72 hours. BMET No results for input(s): "NA", "K", "CL", "CO2", "GLUCOSE", "BUN", "CREATININE", "CALCIUM" in the last 72 hours.  Studies/Results: DG Lumbar Spine 2-3 Views Result Date: 03/12/2024 CLINICAL DATA:  [id number] Elective surgery [id number] EXAM: LUMBAR SPINE - 2-3 VIEW COMPARISON:  Preoperative imaging FINDINGS: Two fluoroscopic spot views of the lumbar spine submitted from the operating room. Pedicle screws at L4 and L5 with interbody spacer in place. Fluoroscopy time 23 seconds. Dose 22.77 mGy IMPRESSION: Intraoperative fluoroscopy during lumbar spine surgery. Electronically Signed   By: Chadwick Colonel M.D.   On: 03/12/2024 14:17   DG Lumbar Spine 1 View Result Date: 03/12/2024 CLINICAL DATA:  Elective surgery. EXAM: LUMBAR SPINE - 1 VIEW COMPARISON:  Preoperative imaging FINDINGS: Cross-table lateral view of the lumbar spine submitted from the operating room. Surgical instruments localize posteriorly at the L4 level. Instrument also projects over the sacrum. IMPRESSION: Cross-table  lateral view of the lumbar spine for surgical localization. Electronically Signed   By: Chadwick Colonel M.D.   On: 03/12/2024 14:16   DG C-Arm 1-60 Min-No Report Result Date: 03/12/2024 Fluoroscopy was utilized by the requesting physician.  No radiographic interpretation.    Assessment/Plan: The patient is doing well.  He will likely go home after he works with PT.  I gave him his discharge instructions and answered all his questions.  LOS: 0 days     [name] 03/13/2024, 9:45 AM     Patient ID: Tony Campos, male   DOB: 1966-04-24, 58 y.o.   MRN: [id number]

## 2024-03-13 NOTE — Discharge Summary (Signed)
 Physician Discharge Summary     Providing Compassionate, Quality Care - Together   Patient ID: Tony Campos MRN: [id number] DOB/AGE: [birth year] 56 y.o.  Admit date: 03/12/2024 Discharge date: 03/13/2024  Admission Diagnoses: Spondylolisthesis of lumbar region  Discharge Diagnoses:  Principal Problem:   Spondylolisthesis of lumbar region   Discharged Condition: good  Hospital Course: Patient underwent an L4-5 posterior lumbar fusion by Dr. Larrie Po on 03/12/2024. He was admitted to [facility]  following recovery from anesthesia in the PACU. His postoperative course has been uncomplicated. He has worked with both physical therapy who feels the patient is ready for discharge home. He is ambulating with a rolling walker. He is tolerating a normal diet. He is not having any bowel or bladder dysfunction. His pain is well-controlled with oral pain medication. He is ready for discharge home.   Consults: PT/TOC  Significant Diagnostic Studies: radiology: DG Lumbar Spine 2-3 Views Result Date: 03/12/2024 CLINICAL DATA:  [id number] Elective surgery [id number] EXAM: LUMBAR SPINE - 2-3 VIEW COMPARISON:  Preoperative imaging FINDINGS: Two fluoroscopic spot views of the lumbar spine submitted from the operating room. Pedicle screws at L4 and L5 with interbody spacer in place. Fluoroscopy time 23 seconds. Dose 22.77 mGy IMPRESSION: Intraoperative fluoroscopy during lumbar spine surgery. Electronically Signed   By: Chadwick Colonel M.D.   On: 03/12/2024 14:17   DG Lumbar Spine 1 View Result Date: 03/12/2024 CLINICAL DATA:  Elective surgery. EXAM: LUMBAR SPINE - 1 VIEW COMPARISON:  Preoperative imaging FINDINGS: Cross-table lateral view of the lumbar spine submitted from the operating room. Surgical instruments localize posteriorly at the L4 level. Instrument also projects over the sacrum. IMPRESSION: Cross-table lateral view of the lumbar spine for surgical localization. Electronically Signed   By: Chadwick Colonel  M.D.   On: 03/12/2024 14:16   DG C-Arm 1-60 Min-No Report Result Date: 03/12/2024 Fluoroscopy was utilized by the requesting physician.  No radiographic interpretation.     Treatments: surgery:  Bilateral L4-5 laminotomy/foraminotomies/medial facetectomy to decompress the bilateral L4 and L5 nerve roots(the work required to do this was in addition to the work required to do the posterior lumbar interbody fusion because of the patient's spinal stenosis, facet arthropathy. Etc. requiring a wide decompression of the nerve roots.); redo L4-5 discectomy; left L4-5 transforaminal lumbar interbody fusion with local morselized autograft bone and Zimmer DBM; insertion of interbody prosthesis at L4-5 (globus peek expandable interbody prosthesis); posterior nonsegmental instrumentation from L4 to L5 with globus titanium pedicle screws and rods; posterior lateral arthrodesis at L4-5 with local morselized autograft bone and Zimmer DBM.   Discharge Exam: Blood pressure 100/60, pulse 67, temperature 98 F (36.7 C), resp. rate 17, height 6' (1.829 m), weight 124.7 kg, SpO2 96%.  Per report: Alert and oriented x 4 PERRLA CN II-XII grossly intact MAE, Strength and sensation intact Incision is covered with Honeycomb dressing and Steri Strips; Dressing is clean, dry, and intact  Disposition: Discharge disposition: 01-Home or Self Care       Discharge Instructions     Call MD for:  difficulty breathing, headache or visual disturbances   Complete by: As directed    Call MD for:  hives   Complete by: As directed    Call MD for:  persistant dizziness or light-headedness   Complete by: As directed    Call MD for:  persistant nausea and vomiting   Complete by: As directed    Call MD for:  redness, tenderness, or signs of infection (pain, swelling, redness, odor  or green/yellow discharge around incision site)   Complete by: As directed    Call MD for:  severe uncontrolled pain   Complete by: As  directed    Diet - low sodium heart healthy   Complete by: As directed    If the dressing is still on your incision site when you go home, remove it on the third day after your surgery date. Remove dressing if it begins to fall off, or if it is dirty or damaged before the third day.   Complete by: As directed    Increase activity slowly   Complete by: As directed       Allergies as of 03/13/2024   No Known Allergies      Medication List     TAKE these medications    Alpha-Lipoic Acid 600 MG Caps Take 1 capsule by mouth 2 (two) times daily. Takes with Lyrica   aspirin EC 81 MG tablet Take 81 mg by mouth daily.   baclofen 10 MG tablet Commonly known as: LIORESAL Take 10 mg by mouth 3 (three) times daily.   brexpiprazole 2 MG Tabs tablet Commonly known as: REXULTI Take 2 mg by mouth daily.   clonazePAM 1 MG tablet Commonly known as: KLONOPIN Take 1 mg by mouth 2 (two) times daily.   cyclobenzaprine 10 MG tablet Commonly known as: FLEXERIL Take 1 tablet (10 mg total) by mouth 3 (three) times daily as needed for muscle spasms.   docusate sodium 100 MG capsule Commonly known as: COLACE Take 1 capsule (100 mg total) by mouth 2 (two) times daily.   DULoxetine 60 MG capsule Commonly known as: CYMBALTA Take 60 mg by mouth daily.   ferrous sulfate 325 (65 FE) MG tablet Take 325 mg by mouth daily.   lamoTRIgine 200 MG tablet Commonly known as: LAMICTAL Take 200 mg by mouth 2 (two) times daily.   levothyroxine 125 MCG tablet Commonly known as: SYNTHROID take 1 tablet (125 microgram total) by mouth daily before breakfast.   losartan 50 MG tablet Commonly known as: COZAAR take 1 tablet (50 milligram total) by mouth daily.   metoprolol tartrate 25 MG tablet Commonly known as: LOPRESSOR take (1) tablet twice daily.   morphine 15 MG 12 hr tablet Commonly known as: MS CONTIN Take 15 mg by mouth 3 (three) times daily.   Narcan 4 MG/0.1ML Liqd nasal spray  kit Generic drug: naloxone Place 1 spray into the nose once.   oxyCODONE-acetaminophen 5-325 MG tablet Commonly known as: Percocet Take 1 tablet by mouth every 6 (six) hours as needed for severe pain (pain score 7-10) (Postoeprative pain).   pantoprazole 40 MG tablet Commonly known as: PROTONIX TAKE (1) TABLET TWICE DAILY.   pregabalin 150 MG capsule Commonly known as: LYRICA Take 150 mg by mouth 2 (two) times daily.   rosuvastatin 40 MG tablet Commonly known as: CRESTOR Take 1 tablet (40 mg total) by mouth daily.   Sunosi 150 MG Tabs Generic drug: Solriamfetol HCl Take 150 mg by mouth daily.   valACYclovir 500 MG tablet Commonly known as: VALTREX take 1 tablet (500 milligram total) by mouth daily.   vitamin B-12 500 MCG tablet Commonly known as: CYANOCOBALAMIN Take 500 mcg by mouth daily.   Vitamin E 180 MG (400 UNIT) Caps Take 400 Units by mouth daily.               Discharge Care Instructions  (From admission, onward)  Start     Ordered   03/13/24 0000  If the dressing is still on your incision site when you go home, remove it on the third day after your surgery date. Remove dressing if it begins to fall off, or if it is dirty or damaged before the third day.        03/13/24 1541            Follow-up Information     Garry Kansas, MD. Go on 04/22/2024.   Specialty: Neurosurgery Why: First post op appointment with x-rays is on 04/22/2024 at 1:45 PM. Contact information: [address] [place] [address] [phone]                 Signed: Henreitta Locus, DNP, AGNP-C Nurse Practitioner  [facility] [address], [place], [place] [address] P: [phone]    F: [phone]  03/13/2024, 3:44 PM

## 2024-03-14 ENCOUNTER — Other Ambulatory Visit: Payer: Self-pay | Admitting: Family Medicine

## 2024-03-14 LAB — GLUCOSE, CAPILLARY: Glucose-Capillary: 110 mg/dL — ABNORMAL HIGH (ref 70–99)

## 2024-03-14 NOTE — Plan of Care (Signed)

## 2024-03-14 NOTE — Progress Notes (Signed)
 Physical Therapy Treatment Patient Details Name: Tony Campos MRN: [id number] DOB: 1966-03-10 Today's Date: 03/14/2024   History of Present Illness Patient is 58 y.o. male s/p L4-5 discectomy on 03/12/24 due to spondylolisthesis and recurrent herniated disc. PMH significant for AAA (without rupture), GERD, anxiety, depression, bipolar disorder, HLD, HTN, hypothyroidism, meningitis, PAD, Rt knee surgery x3, Lt knee surgery x1, appendectomy, cholecystectomy.    PT Comments  Patient resting in bed and c/o great back pain today, agreeable to mobilize with PT. Pt unable to recall therapy visit yesterday with gait and stair mobility, unable to recall "BLT" spine precautions and handout reviewed with pt. Pt verbalized rolling for log roll technique with bed mobility. Supervision with reliance on bed features for rolling and side to sit EOB. Pt reliant on Bil Ue's for power up to RW and extra time to pressup through Bil LE's for full stand. Pt's spouse present for education on guarding with transfers and gait and provided CGA for gait with supervision from therapist. Pt noted to have increased Lt LE flexion in standing and c/o of increased pain and weakness in Lt. Distance limited today by pain and pt provided seated rest after ~45'. EOS pt educated on importance of time OOB and remaining alert/awake throughout daytime. Agreeable to remain in recliner, Alarm on and call bell within reach. Will continue to progress as able during acute stay.  Of note pt also reporting slight blurred/double vision, states is not entirely new since surgery but maybe a little worse. Pt reports blurred with bil eyes open and Rt eye open, vision clear with Lt eye open and Rt eye covered. No other changes or concerns verbalized by pt.    If plan is discharge home, recommend the following: Assist for transportation;Help with stairs or ramp for entrance;A little help with bathing/dressing/bathroom;Assistance with cooking/housework    Can travel by private vehicle        Equipment Recommendations  None recommended by PT    Recommendations for Other Services       Precautions / Restrictions Precautions Precautions: Fall;Back Precaution Booklet Issued: Yes (comment) Recall of Precautions/Restrictions: Intact Restrictions Weight Bearing Restrictions Per Provider Order: No     Mobility  Bed Mobility Overal bed mobility: Needs Assistance Bed Mobility: Rolling, Sidelying to Sit Rolling: Supervision, Used rails Sidelying to sit: Supervision, Used rails       General bed mobility comments: cues for log roll technique wtih hand placement.    Transfers Overall transfer level: Needs assistance Equipment used: Rolling walker (2 wheels) Transfers: Sit to/from Stand Sit to Stand: Contact guard assist           General transfer comment: CGA for safety, bed elevated slightly for pt height. cues for hands placement with power up. pt takign extra time to transition hands to RW and significant effort to power up through LE's for full standing.    Ambulation/Gait Ambulation/Gait assistance: Contact guard assist, Supervision Gait Distance (Feet): 45 Feet Assistive device: Rolling walker (2 wheels) Gait Pattern/deviations: Step-through pattern, Decreased stride length, Wide base of support, Decreased step length - right, Decreased step length - left, Shuffle Gait velocity: decr     General Gait Details: cues for position to walker, pt's knees flexed sligthly in stance Lt>Rt. Spouse present and cues for safe guarding position to RW. pt limited by pain in back and Lt LE requesting seated rest earlier.   Stairs             Psychologist, prison and probation services  Tilt Bed    Modified Rankin (Stroke Patients Only)       Balance Overall balance assessment: Mild deficits observed, not formally tested                                          Communication Communication Communication: No  apparent difficulties  Cognition Arousal: Alert, Lethargic Behavior During Therapy: WFL for tasks assessed/performed   PT - Cognitive impairments: No apparent impairments                       PT - Cognition Comments: pt groggy at start of session, eyes closed with supine>sit and sitting EOB. Pt unable to recall gait and stair training yesterday. Patient reporting numbness in Rt LE but pain and weakness in Lt LE. pt stated Thursday and corrected to Friday, aware of place and situation. Following commands: Intact      Cueing Cueing Techniques: Verbal cues  Exercises      General Comments        Pertinent Vitals/Pain Pain Assessment Pain Assessment: Faces Faces Pain Scale: Hurts even more Pain Location: back Pain Descriptors / Indicators: Aching, Discomfort Pain Intervention(s): Limited activity within patient's tolerance, Monitored during session, Premedicated before session, Repositioned    Home Living                          Prior Function            PT Goals (current goals can now be found in the care plan section) Acute Rehab PT Goals Patient Stated Goal: recover mobility, independence, and stop hurting PT Goal Formulation: With patient Time For Goal Achievement: 03/20/24 Potential to Achieve Goals: Good Progress towards PT goals: Progressing toward goals (limited by pain)    Frequency    Min 5X/week      PT Plan      Co-evaluation              AM-PAC PT "6 Clicks" Mobility   Outcome Measure  Help needed turning from your back to your side while in a flat bed without using bedrails?: A Little Help needed moving from lying on your back to sitting on the side of a flat bed without using bedrails?: A Little Help needed moving to and from a bed to a chair (including a wheelchair)?: A Little Help needed standing up from a chair using your arms (e.g., wheelchair or bedside chair)?: A Little Help needed to walk in hospital room?: A  Little Help needed climbing 3-5 steps with a railing? : A Little 6 Click Score: 18    End of Session Equipment Utilized During Treatment: Gait belt;Back brace Activity Tolerance: Patient tolerated treatment well Patient left: in chair;with call bell/phone within reach;with chair alarm set;with family/visitor present Nurse Communication: Mobility status PT Visit Diagnosis: Unsteadiness on feet (R26.81);Muscle weakness (generalized) (M62.81);Difficulty in walking, not elsewhere classified (R26.2);Pain Pain - part of body:  (back)     Time: 1200-1230 PT Time Calculation (min) (ACUTE ONLY): 30 min  Charges:    $Gait Training: 8-22 mins $Therapeutic Activity: 8-22 mins PT General Charges $$ ACUTE PT VISIT: 1 Visit                     Tish Forge, DPT Acute Rehabilitation Services Office [phone]  03/14/24 12:52 PM

## 2024-03-14 NOTE — Care Management Obs Status (Signed)
 MEDICARE OBSERVATION STATUS NOTIFICATION   Patient Details  Name: Tony Campos MRN: [id number] Date of Birth: September 05, 1966   Medicare Observation Status Notification Given:  Yes    Alisa App, RN 03/14/2024, 3:39 PM

## 2024-03-14 NOTE — Progress Notes (Signed)
 Patient ID: Tony Campos, male   DOB: 09-18-1966, 58 y.o.   MRN: [id number] BP (!) 150/77 (BP Location: Left Arm)   Pulse 100   Temp 98.9 F (37.2 C) (Oral)   Resp 16   Ht 6' (1.829 m)   Wt 124.7 kg   SpO2 93%   BMI 37.30 kg/m  Alert and oriented x 4, speech is clear and fluent Moving all extremities well Wound is clean, dry, no signs of infection States he continues to have too much pain to navigate his home environment Will evaluate tomorrow

## 2024-03-14 NOTE — Plan of Care (Signed)
  Problem: Education: Goal: Knowledge of General Education information will improve Description: Including pain rating scale, medication(s)/side effects and non-pharmacologic comfort measures Outcome: Progressing   Problem: Health Behavior/Discharge P

## 2024-03-14 NOTE — Progress Notes (Signed)
 Mobility Specialist Progress Note:    03/14/24 0900  Mobility  Activity Ambulated with assistance in hallway  Level of Assistance Contact guard assist, steadying assist  Assistive Device Front wheel walker  Distance Ambulated (ft) 100 ft  Activity Response Tolerated well  Mobility Referral Yes  Mobility visit 1 Mobility  Mobility Specialist Start Time (ACUTE ONLY) [id number]  Mobility Specialist Stop Time (ACUTE ONLY) [id number]  Mobility Specialist Time Calculation (min) (ACUTE ONLY) 17 min   Pt received on EOB and agreeable. C/o some back pain, having just received meds. Took x1 seated rest break. Required minA to stand from low surface. Returned to room w/o fault. Pt left in bed with call bell and all needs met. Bed alarm on.  D'Vante Nicholaus Mobility Specialist Please contact via Special Educational Needs Teacher or Rehab office at [phone]

## 2024-03-15 NOTE — Progress Notes (Signed)
 Patient has been drowsy today. Wife expressed concern that he usually takes Sunosi  at home to stimulate him but he has not taken it in a few days. RN consulted with Dr. Ellery Guthrie who advised limiting narcotics for this patient. Patients vital signs have been stable throughout shift.

## 2024-03-15 NOTE — Plan of Care (Signed)
   Problem: Activity: Goal: Risk for activity intolerance will decrease Outcome: Progressing   Problem: Pain Managment: Goal: General experience of comfort will improve and/or be controlled Outcome: Progressing   Problem: Safety: Goal: Ability to remain free from injury will improve Outcome: Progressing

## 2024-03-15 NOTE — Progress Notes (Signed)
 Physical Therapy Treatment Patient Details Name: Tony Campos MRN: [id number] DOB: [birth year] Today's Date: 03/15/2024   History of Present Illness Patient is 58 y.o. male s/p L4-5 discectomy on 03/12/24 due to spondylolisthesis and recurrent herniated disc. PMH significant for AAA (without rupture), GERD, anxiety, depression, bipolar disorder, HLD, HTN, hypothyroidism, meningitis, PAD, Sleep apnea, R knee surgery x3, L knee surgery x1, appendectomy, cholecystectomy.    PT Comments  Pt received in supine, lethargic and difficult to rouse, spouse present and agreeable to PTA working with him. Pt SpO2 checked and noted to be low as O2 (SpO2 82% when checked) as nasal cannula was outside of his nares, once repositioned pt SPO2 improved to >88% within 5 mins with cues for pursed-lip breathing, but pt remains lethargic throughout session. Pt needing dense multimodal cues and increased time to initiate and perform log roll to R EOB, STS and step pivot transfer toward chair on his R side. Pt with poor seated balance today and maintains eyes closed sitting/standing unless constantly cued to open them and attend to task. Pt BP stable pre and post-transfer training, pt c/o BLE weakness/fatigue after standing ~3 mins in front of chair attempting to use urinal, not successful with attempt. Pt agreeable to sit up in recliner with spouse present ~1 hour at least but will notify staff to help him reposition every 30 to 60 mins per discussion with PTA, RN also notified to reposition him frequently to reduce pressure on his back.  Oxygen  Therapy -1041 AM  SpO2 (!) 82 % (patient received with [place] outside of nares while sleeping, SpO2 improved to >88% once 2L/min [place] replaced after a few mins)  O2 Device Room Air  Patient Activity (if Appropriate) In bed   Vital Signs -11:11 AM  Pulse Rate 84  Pulse Rate Source Dinamap  BP 125/73  BP Location Left Arm  BP Method Automatic  Patient Position (if appropriate)  Sitting (recliner)  Oxygen  Therapy  SpO2 93 %  O2 Device Nasal Cannula  O2 Flow Rate (L/min) 2 L/min     If plan is discharge home, recommend the following: Assist for transportation;Help with stairs or ramp for entrance;A little help with bathing/dressing/bathroom;Assistance with cooking/housework;A little help with walking and/or transfers   Can travel by private vehicle        Equipment Recommendations  None recommended by PT (pt has RW at home)    Recommendations for Other Services OT consult (consider OT consult given slow progress and pt is not at cognitive baseline)     Precautions / Restrictions Precautions Precautions: Fall;Back Precaution Booklet Issued: Yes (comment) Recall of Precautions/Restrictions: Intact Required Braces or Orthoses: Spinal Brace Spinal Brace: Lumbar corset (for comfort, no orders in system) Restrictions Weight Bearing Restrictions Per Provider Order: No     Mobility  Bed Mobility Overal bed mobility: Needs Assistance Bed Mobility: Rolling, Sidelying to Sit Rolling: Min assist Sidelying to sit: Min assist, HOB elevated       General bed mobility comments: cues for log roll technique wtih hand placement, increased assist today due to pt lethargy, minA for trunk lifting/stability without rails; per spouse his HOB elevates at home but he does not have rails    Transfers Overall transfer level: Needs assistance Equipment used: Rolling walker (2 wheels) Transfers: Sit to/from Stand Sit to Stand: Min assist, From elevated surface, +2 safety/equipment           General transfer comment: EOB>RW, pivotal steps to chair, minA. cues for hands placement  with power up, needs extra time to transition hands to RW and significant effort to power up through LE's for full standing. Pt tends to maintain some bil knee flexion in standing and posterior instability. Pt closing his eyes and appears to be falling asleep sitting and standing unless  consistently cued.    Ambulation/Gait               General Gait Details: unsafe to attempt today due to pt lethargy.   Stairs             Wheelchair Mobility     Tilt Bed    Modified Rankin (Stroke Patients Only)       Balance Overall balance assessment: Needs assistance Sitting-balance support: No upper extremity supported, Bilateral upper extremity supported Sitting balance-Leahy Scale: Poor Sitting balance - Comments: needs consistent min to modA to prevent posteiror LOB due to lethargy today. Postural control: Posterior lean Standing balance support: Bilateral upper extremity supported, During functional activity, Reliant on assistive device for balance Standing balance-Leahy Scale: Poor                              Communication Communication Communication: Impaired;Other (comment) Factors Affecting Communication: Difficulty expressing self;Other (comment) (due to lethargy)  Cognition Arousal: Lethargic Behavior During Therapy: WFL for tasks assessed/performed   PT - Cognitive impairments: Memory, Attention, Initiation, Sequencing, Problem solving, Safety/Judgement                       PT - Cognition Comments: pt groggy at start of session, and remains lethargic throughout; needs multimodal cues and increased time to process and initiate all tasks; poor carryover of precautions during session. Spouse and PTA reminding him not to bend forward too far with anterior scooting frequently. Following commands: Impaired Following commands impaired: Follows one step commands with increased time, Follows multi-step commands inconsistently    Cueing Cueing Techniques: Verbal cues, Gestural cues, Tactile cues, Visual cues  Exercises Other Exercises Other Exercises: seated BLE AROM: LAQ x5 reps ea Other Exercises: PTA encourages use of IS hourly while awake    General Comments General comments (skin integrity, edema, etc.): See PTA  comments above; hypoxic on RA, needs 2L O2 [place]; HR WFL, BP WFL sitting EOB and in recliner after pivoting      Pertinent Vitals/Pain Pain Assessment Pain Assessment: Faces Faces Pain Scale: Hurts little more Pain Location: back Pain Descriptors / Indicators: Aching, Discomfort, Grimacing, Operative site guarding Pain Intervention(s): Limited activity within patient's tolerance, Monitored during session, Premedicated before session, Repositioned    Home Living                          Prior Function            PT Goals (current goals can now be found in the care plan section) Acute Rehab PT Goals Patient Stated Goal: recover mobility, independence, and stop hurting PT Goal Formulation: With patient Time For Goal Achievement: 03/20/24 Progress towards PT goals: Progressing toward goals (slowly)    Frequency    Min 5X/week      PT Plan      Co-evaluation              AM-PAC PT "6 Clicks" Mobility   Outcome Measure  Help needed turning from your back to your side while in a flat bed without using bedrails?: A Little  Help needed moving from lying on your back to sitting on the side of a flat bed without using bedrails?: A Little Help needed moving to and from a bed to a chair (including a wheelchair)?: A Little Help needed standing up from a chair using your arms (e.g., wheelchair or bedside chair)?: A Little Help needed to walk in hospital room?: Total (due to lethargy today) Help needed climbing 3-5 steps with a railing? : Total 6 Click Score: 14    End of Session Equipment Utilized During Treatment: Gait belt;Back brace Activity Tolerance: Patient limited by lethargy Patient left: in chair;with call bell/phone within reach;with chair alarm set;with family/visitor present;Other (comment) (reclined, 2L O2 [place] donned dinamap with pulse ox on his finger) Nurse Communication: Mobility status;Other (comment) (pt lethargy, he needs to stand up ~every hour from  chair due to recent back surgery or get bed to chair to bed multiple times a day.) PT Visit Diagnosis: Unsteadiness on feet (R26.81);Muscle weakness (generalized) (M62.81);Difficulty in walking, not elsewhere classified (R26.2);Pain Pain - part of body:  (back)     Time: 3086-5784 PT Time Calculation (min) (ACUTE ONLY): 34 min  Charges:    $Therapeutic Activity: 23-37 mins PT General Charges $$ ACUTE PT VISIT: 1 Visit                     Edessa Jakubowicz P., PTA Acute Rehabilitation Services Secure Chat Preferred 9a-5:30pm Office: [phone]    Mariel Shope [facility] 03/15/2024, 1:13 PM

## 2024-03-15 NOTE — Progress Notes (Signed)
 Patient ID: Tony Campos, male   DOB: 1966-07-24, 58 y.o.   MRN: [id number] Patient seems somewhat somnolent Physical therapy is present today to mobilize patient Dressings remain clean and dry on back Continue supportive care Not ready for discharge today

## 2024-03-15 NOTE — Plan of Care (Signed)

## 2024-03-15 NOTE — Progress Notes (Signed)
 SATURATION QUALIFICATIONS: (This note is used to comply with regulatory documentation for home oxygen )  Patient Saturations on Room Air at Rest = 82%  Patient Saturations on Room Air while Ambulating = N/A  Patient Saturations on 2 Liters of oxygen  while Ambulating = 90%  Please briefly explain why patient needs home oxygen : Pt received sleeping in supine with [place] positioned outside his nares and hypoxic on RA at rest. Once [place] placed on 2L/min, SpO2 improved to >88% within 5 mins. Pt SpO2 90-94% on 2L/min with standing trial and pivot to recliner, RN notified.

## 2024-03-16 MED ORDER — MORPHINE SULFATE ER 15 MG PO TBCR
15.0000 mg | EXTENDED_RELEASE_TABLET | Freq: Two times a day (BID) | ORAL | Status: DC
  Administered 2024-03-16 – 2024-03-18 (×4): 15 mg via ORAL
  Filled 2024-03-16 (×5): qty 1

## 2024-03-16 NOTE — Progress Notes (Signed)
 Patient ID: Tony Campos, male   DOB: Jan 12, 1966, 58 y.o.   MRN: [id number] Patient's level of consciousness is much improved.  Will decrease his pain medication to twice daily for the MS Contin .  Still requiring supplemental oxygen .

## 2024-03-16 NOTE — Progress Notes (Signed)
 Physical Therapy Treatment Patient Details Name: Tony Campos MRN: [id number] DOB: [birth year] Today's Date: 03/16/2024   History of Present Illness Patient is 58 y.o. male s/p L4-5 discectomy on 03/12/24 due to spondylolisthesis and recurrent herniated disc. PMH significant for AAA (without rupture), GERD, anxiety, depression, bipolar disorder, HLD, HTN, hypothyroidism, meningitis, PAD, Sleep apnea, R knee surgery x3, L knee surgery x1, appendectomy, cholecystectomy.    PT Comments  Pt more alert today and able to progress some.  Still limited by lethargy and fatigue.  Pt able to ambulate ~5' today.  Recalled 2/3 back precautions.  Needs continued therapy before discharge.      If plan is discharge home, recommend the following: Assist for transportation;Help with stairs or ramp for entrance;A little help with bathing/dressing/bathroom;Assistance with cooking/housework;A little help with walking and/or transfers   Can travel by private vehicle        Equipment Recommendations  None recommended by PT    Recommendations for Other Services OT consult     Precautions / Restrictions Precautions Precautions: Fall;Back Recall of Precautions/Restrictions: Impaired Precaution/Restrictions Comments: recalled 2/3 back precautions Required Braces or Orthoses: Spinal Brace Spinal Brace: Lumbar corset Restrictions Weight Bearing Restrictions Per Provider Order: No Other Position/Activity Restrictions: wife assisted pt in donning back brace     Mobility  Bed Mobility Overal bed mobility: Needs Assistance Bed Mobility: Rolling, Sidelying to Sit Rolling: Supervision, Used rails Sidelying to sit: Supervision, Used rails       General bed mobility comments: recalled need to log roll    Transfers Overall transfer level: Needs assistance Equipment used: Rolling walker (2 wheels) Transfers: Sit to/from Stand Sit to Stand: Min assist           General transfer comment: slight  assistance to power up and ensure stability    Ambulation/Gait Ambulation/Gait assistance: Contact guard assist Gait Distance (Feet): 5 Feet Assistive device: Rolling walker (2 wheels) Gait Pattern/deviations: Step-through pattern, Decreased stride length, Wide base of support, Decreased step length - right, Decreased step length - left Gait velocity: decreased     General Gait Details: able to take a few steps today, limited by fatigue   Stairs             Wheelchair Mobility     Tilt Bed    Modified Rankin (Stroke Patients Only)       Balance Overall balance assessment: Needs assistance Sitting-balance support: No upper extremity supported, Feet supported Sitting balance-Leahy Scale: Fair Sitting balance - Comments: still limited mostly be lethargy   Standing balance support: Bilateral upper extremity supported, During functional activity, Reliant on assistive device for balance Standing balance-Leahy Scale: Poor                              Communication Communication Communication: No apparent difficulties  Cognition Arousal: Lethargic                             PT - Cognition Comments: Pt still lethargic but more alert today.  Better recall of precaution and quicker processing time. Following commands: Intact Following commands impaired: Only follows one step commands consistently    Cueing    Exercises      General Comments        Pertinent Vitals/Pain Pain Assessment Pain Assessment: 0-10 Pain Score: 5  Pain Descriptors / Indicators: Discomfort, Operative site guarding Pain Intervention(s): Limited activity  within patient's tolerance, Monitored during session    Home Living                          Prior Function            PT Goals (current goals can now be found in the care plan section) Progress towards PT goals: Progressing toward goals    Frequency    Min 5X/week      PT Plan       Co-evaluation              AM-PAC PT "6 Clicks" Mobility   Outcome Measure  Help needed turning from your back to your side while in a flat bed without using bedrails?: A Little   Help needed moving to and from a bed to a chair (including a wheelchair)?: A Little Help needed standing up from a chair using your arms (e.g., wheelchair or bedside chair)?: A Little Help needed to walk in hospital room?: A Little Help needed climbing 3-5 steps with a railing? : A Lot 6 Click Score: 14    End of Session Equipment Utilized During Treatment: Gait belt;Back brace Activity Tolerance: Patient limited by fatigue Patient left: in chair;with call bell/phone within reach;with family/visitor present   PT Visit Diagnosis: Unsteadiness on feet (R26.81);Muscle weakness (generalized) (M62.81);Difficulty in walking, not elsewhere classified (R26.2)     Time: 1100-1120 PT Time Calculation (min) (ACUTE ONLY): 20 min  Charges:    $Gait Training: 8-22 mins PT General Charges $$ ACUTE PT VISIT: 1 Visit                     03/16/2024 Sammi Crick, PT Acute Rehabilitation Services Office:  [phone]    Norene Beards 03/16/2024, 11:38 AM

## 2024-03-16 NOTE — Plan of Care (Signed)
   Problem: Activity: Goal: Risk for activity intolerance will decrease Outcome: Progressing   Problem: Pain Managment: Goal: General experience of comfort will improve and/or be controlled Outcome: Progressing   Problem: Safety: Goal: Ability to remain free from injury will improve Outcome: Progressing

## 2024-03-17 MED ORDER — OXYCODONE HCL 5 MG PO TABS: 5.0000 mg | ORAL_TABLET | ORAL | Status: DC | PRN

## 2024-03-17 MED FILL — Sodium Chloride IV Soln 0.9%: INTRAVENOUS | Qty: 2000 | Status: AC

## 2024-03-17 MED FILL — Heparin Sodium (Porcine) Inj 1000 Unit/ML: INTRAMUSCULAR | Qty: 30 | Status: AC

## 2024-03-17 NOTE — Telephone Encounter (Signed)
 Requested Prescriptions  Pending Prescriptions Disp Refills   rosuvastatin  (CRESTOR ) 40 MG tablet [Pharmacy Med Name: ROSUVASTATIN  CALCIUM  40 MG TAB] 90 tablet 1    Sig: take 1 tablet (40 MILLIGRAM total) by mouth daily.     Cardiovascular:  Antilipid - Statins 2 Failed - 03/17/2024  7:59 AM      Failed - Lipid Panel in normal range within the last 12 months    Cholesterol  Date Value Ref Range Status  09/28/2023 145 <200 mg/dL Final   LDL Cholesterol (Calc)  Date Value Ref Range Status  09/28/2023 70 mg/dL (calc) Final    Comment:    Reference range: <100 . Desirable range <100 mg/dL for primary prevention;   <70 mg/dL for patients with CHD or diabetic patients  with > or = 2 CHD risk factors. Aaron Aas LDL-C is now calculated using the Martin-Hopkins  calculation, which is a validated novel method providing  better accuracy than the Friedewald equation in the  estimation of LDL-C.  Melinda Sprawls et al. Erroll Heard. 8119;147(82): 2061-2068  (http://education.[facility].com/faq/FAQ164)    HDL  Date Value Ref Range Status  09/28/2023 41 > OR = 40 mg/dL Final   Triglycerides  Date Value Ref Range Status  09/28/2023 244 (H) <150 mg/dL Final    Comment:    . If a non-fasting specimen was collected, consider repeat triglyceride testing on a fasting specimen if clinically indicated.  Imagene Mam et al. J. of Clin. Lipidol. 2015;9:129-169. Aaron Aas          Passed - Cr in normal range and within 360 days    Creat  Date Value Ref Range Status  09/28/2023 0.83 0.70 - 1.30 mg/dL Final   Creatinine, Ser  Date Value Ref Range Status  03/06/2024 1.07 0.61 - 1.24 mg/dL Final         Passed - Patient is not pregnant      Passed - Valid encounter within last 12 months    Recent Outpatient Visits           1 week ago Inflammation of soft tissue   [facility] Jenelle Mis, FNP   3 months ago Viral URI with cough   [facility]  Jenelle Mis, FNP   5 months ago ASCVD (arteriosclerotic cardiovascular disease)   [facility] Cheril Cork, Cisco Crest, MD   8 months ago Great toe pain, right   [facility] Pickard, Cisco Crest, MD   9 months ago Fatigue, unspecified type   [facility] Pickard, Cisco Crest, MD

## 2024-03-17 NOTE — Progress Notes (Signed)
 Inpatient Rehab Admissions Coordinator:   Consult received and chart reviewed.  Several barriers to pursuing authorization for CIR with [employer] at this time.  PT recommendations currently for outpatient therapy, though note he has experienced a decline in mobility and has been more limited by pain and lethargy since evaluation, so they may reconsider post acute rehab.  Pt also status observation.  I will order OT consult per PT recommendations and discuss with therapy whether they feel pt requires inpatient rehab.    Loye Rumble, PT, DPT Admissions Coordinator [phone] 03/17/24  9:45 AM

## 2024-03-17 NOTE — Progress Notes (Signed)
 Subjective: The patient is alert and pleasant.  He has barely ambulated throughout the weekend.  Objective: Vital signs in last 24 hours: Temp:  [98 F (36.7 C)-100.2 F (37.9 C)] 98.5 F (36.9 C) (04/21 0719) Pulse Rate:  [96-109] 100 (04/21 0719) Resp:  [16-17] 16 (04/21 0423) BP: (112-132)/(75-97) 132/75 (04/21 0719) SpO2:  [90 %-95 %] 95 % (04/21 0719) Estimated body mass index is 37.3 kg/m as calculated from the following:   Height as of this encounter: 6' (1.829 m).   Weight as of this encounter: 124.7 kg.   Intake/Output from previous day: No intake/output data recorded. Intake/Output this shift: No intake/output data recorded.  Physical exam the patient is alert and oriented.  His lower extremity strength is normal.  I removed the original dressing.  His wound is healing well.  Lab Results: No results for input(s): "WBC", "HGB", "HCT", "PLT" in the last 72 hours. BMET No results for input(s): "NA", "K", "CL", "CO2", "GLUCOSE", "BUN", "CREATININE", "CALCIUM " in the last 72 hours.  Studies/Results: No results found.  Assessment/Plan: Postop day #5: I have encouraged the patient to mobilize.  I have explained that his lack of ambulation is impeding his recovery and likely contributing to his hypoxia.  It is time to start ambulating.  I will ask rehab to see the patient.  LOS: 0 days     Tony Campos 03/17/2024, 7:51 AM     The patient is alert and pleasant.  Patient ID: Tony Campos, male   DOB: [birth year], 58 y.o.   MRN: [id number]

## 2024-03-17 NOTE — Progress Notes (Signed)
 Inpatient Rehab Admissions Coordinator:   Chart reviewed.  OT recommending AIR, but pt doing well with PT, ambulating 110' with no physical assist.  Recommendations remain for HH.  I would not be able to pursue CIR with needs in only 1 therapy discipline.  Will sign off at this time.   Loye Rumble, PT, DPT Admissions Coordinator [phone] 03/17/24  4:21 PM

## 2024-03-17 NOTE — Evaluation (Signed)
 Occupational Therapy Evaluation Patient Details Name: Tony Campos MRN: [id number] DOB: November 15, 1966 Today's Date: 03/17/2024   History of Present Illness   Patient is 58 y.o. male s/p L4-5 discectomy on 03/12/24 due to spondylolisthesis and recurrent herniated disc. PMH significant for AAA (without rupture), GERD, anxiety, depression, bipolar disorder, HLD, HTN, hypothyroidism, meningitis, PAD, Sleep apnea, R knee surgery x3, L knee surgery x1, appendectomy, cholecystectomy.     Clinical Impressions Pt questionable historian, info gleaned from PT eval for home setup PLOF, pt ind at baseline with ADL/functional mobility, lives with spouse. Pt currently appearing lethargic and with impaired cognition, needing reorientation to place and situation, also needs repetition of instructions throughout for BADL/mobility tasks. Pt needing min-max A for ADLs, mod A for bed mobility and min A for transfers (to correct slight anterior lean) with RW. Pt keeping eyes closed for most of session, SpO2 noted to be down to 87% on 2L O2, incr to 3L O2 with SpO2 93% at end of session. Pt presenting with impairments listed below, will follow acutely. Patient will benefit from intensive inpatient follow-up therapy, >3 hours/day to maximize safety/ind with ADL/functional mobility pending progression.      If plan is discharge home, recommend the following:   A lot of help with walking and/or transfers;A lot of help with bathing/dressing/bathroom;Assistance with cooking/housework;Direct supervision/assist for medications management;Direct supervision/assist for financial management;Assist for transportation;Help with stairs or ramp for entrance;Supervision due to cognitive status     Functional Status Assessment   Patient has had a recent decline in their functional status and demonstrates the ability to make significant improvements in function in a reasonable and predictable amount of time.     Equipment  Recommendations   BSC/3in1;Tub/shower seat     Recommendations for Other Services   PT consult;Rehab consult     Precautions/Restrictions   Precautions Precautions: Fall;Back Precaution Booklet Issued: Yes (comment) Recall of Precautions/Restrictions: Impaired Precaution/Restrictions Comments: educated pt on 3/3 back prec but will need reinforcement Required Braces or Orthoses: Spinal Brace Spinal Brace: Lumbar corset;Applied in sitting position Restrictions Weight Bearing Restrictions Per Provider Order: No     Mobility Bed Mobility Overal bed mobility: Needs Assistance Bed Mobility: Rolling, Sidelying to Sit Rolling: Mod assist Sidelying to sit: Mod assist       General bed mobility comments: mod A for trunk elevation    Transfers Overall transfer level: Needs assistance Equipment used: Rolling walker (2 wheels) Transfers: Sit to/from Stand Sit to Stand: Min assist                  Balance     Sitting balance-Leahy Scale: Fair Sitting balance - Comments: lateral sway in sitting     Standing balance-Leahy Scale: Poor                             ADL either performed or assessed with clinical judgement   ADL Overall ADL's : Needs assistance/impaired Eating/Feeding: Minimal assistance;Sitting   Grooming: Moderate assistance   Upper Body Bathing: Moderate assistance   Lower Body Bathing: Maximal assistance   Upper Body Dressing : Moderate assistance   Lower Body Dressing: Maximal assistance   Toilet Transfer: Minimal assistance;Ambulation;Rolling walker (2 wheels);Regular Toilet   Toileting- Clothing Manipulation and Hygiene: Minimal assistance       Functional mobility during ADLs: Minimal assistance;Rolling walker (2 wheels)       Vision Baseline Vision/History: 1 Wears glasses Additional Comments: denies changes  but not formally assessed     Perception Perception: Not tested       Praxis Praxis: Not tested        Pertinent Vitals/Pain Pain Assessment Pain Assessment: No/denies pain Pain Score: 4  Faces Pain Scale: Hurts little more Pain Location: back Pain Descriptors / Indicators: Discomfort, Operative site guarding Pain Intervention(s): Limited activity within patient's tolerance, Monitored during session, Repositioned     Extremity/Trunk Assessment Upper Extremity Assessment Upper Extremity Assessment: Overall WFL for tasks assessed   Lower Extremity Assessment Lower Extremity Assessment: Defer to PT evaluation   Cervical / Trunk Assessment Cervical / Trunk Assessment: Back Surgery   Communication Communication Communication: No apparent difficulties Factors Affecting Communication:  (speech trailing off at times due to lethargy)   Cognition Arousal: Alert Behavior During Therapy: Restless, Flat affect Cognition: Cognition impaired   Orientation impairments: Situation, Time, Place Awareness: Online awareness impaired Memory impairment (select all impairments): Short-term memory, Declarative long-term memory Attention impairment (select first level of impairment): Focused attention Executive functioning impairment (select all impairments): Problem solving, Reasoning, Initiation, Sequencing OT - Cognition Comments: pt unaware of place or reason for being in the hospital                 Following commands: Impaired Following commands impaired: Follows one step commands inconsistently (needs repetition)     Cueing  General Comments   Cueing Techniques: Verbal cues;Gestural cues;Tactile cues;Visual cues  SpO2 down to 87% on 2L O2, incr to 3L O2 and pt statting 93%   Exercises     Shoulder Instructions      Home Living Family/patient expects to be discharged to:: Private residence Living Arrangements: Spouse/significant other Available Help at Discharge: Family Type of Home: House Home Access: Stairs to enter Secretary/administrator of Steps: 1+1 Entrance  Stairs-Rails: None Home Layout: One level     Bathroom Shower/Tub: Producer, television/film/video: Handicapped height Bathroom Accessibility: Yes   Home Equipment: Agricultural consultant (2 wheels)   Additional Comments: info taken from PT chart review, as pt states he "lives alone" unreliable historian at this time      Prior Functioning/Environment Prior Level of Function : Independent/Modified Independent;Driving                    OT Problem List: Decreased strength;Decreased range of motion;Decreased activity tolerance;Impaired balance (sitting and/or standing);Decreased cognition;Decreased coordination;Decreased knowledge of use of DME or AE;Decreased knowledge of precautions;Decreased safety awareness;Cardiopulmonary status limiting activity   OT Treatment/Interventions: Self-care/ADL training;Therapeutic exercise;Energy conservation;DME and/or AE instruction;Therapeutic activities;Patient/family education;Balance training;Cognitive remediation/compensation;Visual/perceptual remediation/compensation      OT Goals(Current goals can be found in the care plan section)   Acute Rehab OT Goals Patient Stated Goal: pt unable to state OT Goal Formulation: Patient unable to participate in goal setting Time For Goal Achievement: 03/31/24 Potential to Achieve Goals: Good ADL Goals Pt Will Perform Grooming: with supervision;standing Pt Will Perform Upper Body Dressing: with supervision;sitting Pt Will Perform Lower Body Dressing: with supervision;sit to/from stand;sitting/lateral leans Pt Will Transfer to Toilet: with supervision;ambulating;regular height toilet Additional ADL Goal #1: pt will verbalize and adhere to back precautions with min cues during session in prep for ADLs   OT Frequency:  Min 3X/week    Co-evaluation              AM-PAC OT "6 Clicks" Daily Activity     Outcome Measure Help from another person eating meals?: A Little Help from another person  taking care  of personal grooming?: A Little Help from another person toileting, which includes using toliet, bedpan, or urinal?: A Lot Help from another person bathing (including washing, rinsing, drying)?: A Lot Help from another person to put on and taking off regular upper body clothing?: A Lot Help from another person to put on and taking off regular lower body clothing?: A Lot 6 Click Score: 14   End of Session Equipment Utilized During Treatment: Gait belt;Rolling walker (2 wheels);Back brace Nurse Communication: Mobility status;Precautions  Activity Tolerance: Patient limited by lethargy Patient left: in chair;with call bell/phone within reach;with chair alarm set;Other (comment) (door & curtain open)  OT Visit Diagnosis: Unsteadiness on feet (R26.81);Other abnormalities of gait and mobility (R26.89);Muscle weakness (generalized) (M62.81)                Time: 9629-5284 OT Time Calculation (min): 21 min Charges:  OT General Charges $OT Visit: 1 Visit OT Evaluation $OT Eval Moderate Complexity: 1 Mod  Macio Kissoon K, OTD, OTR/L SecureChat Preferred Acute Rehab [phone]   Benedict Brain Koonce 03/17/2024, 3:29 PM

## 2024-03-17 NOTE — Progress Notes (Signed)
 Physical Therapy Treatment Patient Details Name: Tony Campos MRN: [id number] DOB: 1966/01/17 Today's Date: 03/17/2024   History of Present Illness Patient is 58 y.o. male s/p L4-5 discectomy on 03/12/24 due to spondylolisthesis and recurrent herniated disc. PMH significant for AAA (without rupture), GERD, anxiety, depression, bipolar disorder, HLD, HTN, hypothyroidism, meningitis, PAD, Sleep apnea, R knee surgery x3, L knee surgery x1, appendectomy, cholecystectomy.    PT Comments  Continuing work on functional mobility and activity tolerance;  session focused on progressive ambulation, and monitoring of O2 sats while walking on 2 L supplemental O2; Sats ranged 86 to 92%; while sats did dip into mid 80s at times, he did not need supplemental O2 to get sats back up; Overall showing a much improved activity tolerance and gait distance over last few sessions; Worked with Myrtie Atkinson just before pain meds were due, and I wonder if his difficulty participating over the weekend was related to pain meds; Wife, Milana Ali, present and helpful; At this point, I favor dc home over post-acute rhab at SNF   If plan is discharge home, recommend the following: Assist for transportation;Help with stairs or ramp for entrance;A little help with bathing/dressing/bathroom;Assistance with cooking/housework;A little help with walking and/or transfers   Can travel by private vehicle        Equipment Recommendations  None recommended by PT    Recommendations for Other Services OT consult     Precautions / Restrictions Precautions Precautions: Fall;Back Precaution Booklet Issued: Yes (comment) Recall of Precautions/Restrictions: Impaired Precaution/Restrictions Comments: recalled 2/3 back precautions Required Braces or Orthoses: Spinal Brace Spinal Brace: Lumbar corset     Mobility  Bed Mobility Overal bed mobility: Needs Assistance Bed Mobility: Rolling, Sidelying to Sit Rolling: Supervision, Used  rails Sidelying to sit: Supervision, Used rails       General bed mobility comments: recalled need to log roll    Transfers Overall transfer level: Needs assistance Equipment used: Rolling walker (2 wheels) Transfers: Sit to/from Stand Sit to Stand: Min assist           General transfer comment: slight assistance to power up and ensure stability; cues for hand placement and safety    Ambulation/Gait Ambulation/Gait assistance: +2 safety/equipment, Contact guard assist (Chair follow to help with motivation) Gait Distance (Feet): 110 Feet Assistive device: Rolling walker (2 wheels) Gait Pattern/deviations: Step-through pattern, Decreased stride length, Wide base of support, Decreased step length - right, Decreased step length - left       General Gait Details: Short steps, and occasionally closing his eyes; Cues for smoother, longer steps; walked on 2 L supplemental O2   Stairs             Wheelchair Mobility     Tilt Bed    Modified Rankin (Stroke Patients Only)       Balance     Sitting balance-Leahy Scale: Fair       Standing balance-Leahy Scale: Poor                              Communication Communication Communication: No apparent difficulties  Cognition Arousal: Alert (but eyes closed sporadically throughout session) Behavior During Therapy: WFL for tasks assessed/performed   PT - Cognitive impairments: Memory, Problem solving                       PT - Cognition Comments: Eyes closed about 25% of session total; Reports he closes  his eyes to think Following commands: Intact      Cueing Cueing Techniques: Verbal cues, Gestural cues, Tactile cues, Visual cues  Exercises Other Exercises Other Exercises: Deep breathing; Discussed the need for deep breathing, rather than breath holding    General Comments General comments (skin integrity, edema, etc.): Wife, Milana Ali, present and helpful; Took time to discuss the  importance of walking, and considering a bowel program      Pertinent Vitals/Pain Pain Assessment Pain Assessment: Faces Faces Pain Scale: Hurts little more Pain Location: back Pain Descriptors / Indicators: Discomfort, Operative site guarding Pain Intervention(s): Monitored during session    Home Living                          Prior Function            PT Goals (current goals can now be found in the care plan section) Acute Rehab PT Goals Patient Stated Goal: recover mobility, independence, and stop hurting PT Goal Formulation: With patient Time For Goal Achievement: 03/20/24 Potential to Achieve Goals: Good Progress towards PT goals: Progressing toward goals    Frequency    Min 5X/week      PT Plan      Co-evaluation              AM-PAC PT "6 Clicks" Mobility   Outcome Measure  Help needed turning from your back to your side while in a flat bed without using bedrails?: A Little Help needed moving from lying on your back to sitting on the side of a flat bed without using bedrails?: A Little Help needed moving to and from a bed to a chair (including a wheelchair)?: A Little Help needed standing up from a chair using your arms (e.g., wheelchair or bedside chair)?: A Little Help needed to walk in hospital room?: A Little Help needed climbing 3-5 steps with a railing? : A Lot 6 Click Score: 17    End of Session Equipment Utilized During Treatment: Gait belt;Back brace Activity Tolerance: Patient tolerated treatment well Patient left: in chair;with call bell/phone within reach;with family/visitor present Nurse Communication: Mobility status;Other (comment) (pt lethargy, he needs to stand up ~every hour from chair due to recent back surgery or get bed to chair to bed multiple times a day.) PT Visit Diagnosis: Unsteadiness on feet (R26.81);Muscle weakness (generalized) (M62.81);Difficulty in walking, not elsewhere classified (R26.2) Pain - part of  body:  (Back)     Time: 7846-9629 PT Time Calculation (min) (ACUTE ONLY): 37 min  Charges:    $Gait Training: 23-37 mins PT General Charges $$ ACUTE PT VISIT: 1 Visit                     Darcus Eastern, PT  Acute Rehabilitation Services Office [phone] Secure Chat welcomed    Marcial Setting 03/17/2024, 1:59 PM

## 2024-03-18 ENCOUNTER — Observation Stay (HOSPITAL_COMMUNITY)

## 2024-03-18 DIAGNOSIS — G9341 Metabolic encephalopathy: Secondary | ICD-10-CM | POA: Diagnosis not present

## 2024-03-18 DIAGNOSIS — M4316 Spondylolisthesis, lumbar region: Secondary | ICD-10-CM

## 2024-03-18 DIAGNOSIS — S92919A Unspecified fracture of unspecified toe(s), initial encounter for closed fracture: Secondary | ICD-10-CM | POA: Diagnosis not present

## 2024-03-18 DIAGNOSIS — M6282 Rhabdomyolysis: Secondary | ICD-10-CM | POA: Diagnosis not present

## 2024-03-18 DIAGNOSIS — J189 Pneumonia, unspecified organism: Secondary | ICD-10-CM | POA: Diagnosis not present

## 2024-03-18 DIAGNOSIS — R509 Fever, unspecified: Secondary | ICD-10-CM | POA: Diagnosis not present

## 2024-03-18 DIAGNOSIS — R918 Other nonspecific abnormal finding of lung field: Secondary | ICD-10-CM | POA: Diagnosis not present

## 2024-03-18 DIAGNOSIS — M5116 Intervertebral disc disorders with radiculopathy, lumbar region: Secondary | ICD-10-CM | POA: Diagnosis not present

## 2024-03-18 DIAGNOSIS — E039 Hypothyroidism, unspecified: Secondary | ICD-10-CM | POA: Diagnosis not present

## 2024-03-18 DIAGNOSIS — F319 Bipolar disorder, unspecified: Secondary | ICD-10-CM | POA: Diagnosis not present

## 2024-03-18 DIAGNOSIS — J9621 Acute and chronic respiratory failure with hypoxia: Secondary | ICD-10-CM | POA: Diagnosis not present

## 2024-03-18 DIAGNOSIS — E66812 Obesity, class 2: Secondary | ICD-10-CM | POA: Diagnosis not present

## 2024-03-18 DIAGNOSIS — J811 Chronic pulmonary edema: Secondary | ICD-10-CM | POA: Diagnosis not present

## 2024-03-18 DIAGNOSIS — I10 Essential (primary) hypertension: Secondary | ICD-10-CM | POA: Diagnosis not present

## 2024-03-18 DIAGNOSIS — J9622 Acute and chronic respiratory failure with hypercapnia: Secondary | ICD-10-CM | POA: Diagnosis not present

## 2024-03-18 DIAGNOSIS — I714 Abdominal aortic aneurysm, without rupture, unspecified: Secondary | ICD-10-CM | POA: Diagnosis not present

## 2024-03-18 DIAGNOSIS — G4734 Idiopathic sleep related nonobstructive alveolar hypoventilation: Secondary | ICD-10-CM | POA: Diagnosis not present

## 2024-03-18 DIAGNOSIS — J9811 Atelectasis: Secondary | ICD-10-CM | POA: Diagnosis not present

## 2024-03-18 LAB — CBC WITH DIFFERENTIAL/PLATELET
Abs Immature Granulocytes: 0.1 10*3/uL — ABNORMAL HIGH (ref 0.00–0.07)
Basophils Absolute: 0 10*3/uL (ref 0.0–0.1)
Basophils Relative: 0 %
Eosinophils Absolute: 0 10*3/uL (ref 0.0–0.5)
Eosinophils Relative: 0 %
HCT: 25 % — ABNORMAL LOW (ref 39.0–52.0)
Hemoglobin: 8.4 g/dL — ABNORMAL LOW (ref 13.0–17.0)
Immature Granulocytes: 1 %
Lymphocytes Relative: 12 %
Lymphs Abs: 1 10*3/uL (ref 0.7–4.0)
MCH: 30.1 pg (ref 26.0–34.0)
MCHC: 33.6 g/dL (ref 30.0–36.0)
MCV: 89.6 fL (ref 80.0–100.0)
Monocytes Absolute: 1 10*3/uL (ref 0.1–1.0)
Monocytes Relative: 12 %
Neutro Abs: 6.3 10*3/uL (ref 1.7–7.7)
Neutrophils Relative %: 75 %
Platelets: 204 10*3/uL (ref 150–400)
RBC: 2.79 MIL/uL — ABNORMAL LOW (ref 4.22–5.81)
RDW: 16.3 % — ABNORMAL HIGH (ref 11.5–15.5)
WBC: 8.4 10*3/uL (ref 4.0–10.5)
nRBC: 0 % (ref 0.0–0.2)

## 2024-03-18 LAB — BLOOD GAS, ARTERIAL
Acid-Base Excess: 2.4 mmol/L — ABNORMAL HIGH (ref 0.0–2.0)
Bicarbonate: 29.9 mmol/L — ABNORMAL HIGH (ref 20.0–28.0)
O2 Saturation: 94.5 %
Patient temperature: 36.5
pCO2 arterial: 57 mmHg — ABNORMAL HIGH (ref 32–48)
pH, Arterial: 7.33 — ABNORMAL LOW (ref 7.35–7.45)
pO2, Arterial: 61 mmHg — ABNORMAL LOW (ref 83–108)

## 2024-03-18 LAB — D-DIMER, QUANTITATIVE: D-Dimer, Quant: 2.45 ug{FEU}/mL — ABNORMAL HIGH (ref 0.00–0.50)

## 2024-03-18 LAB — BASIC METABOLIC PANEL WITH GFR
Anion gap: 17 — ABNORMAL HIGH (ref 5–15)
Anion gap: 12 (ref 5–15)
BUN: 38 mg/dL — ABNORMAL HIGH (ref 6–20)
BUN: 44 mg/dL — ABNORMAL HIGH (ref 6–20)
CO2: 25 mmol/L (ref 22–32)
CO2: 26 mmol/L (ref 22–32)
Calcium: 9 mg/dL (ref 8.9–10.3)
Calcium: 8.1 mg/dL — ABNORMAL LOW (ref 8.9–10.3)
Chloride: 96 mmol/L — ABNORMAL LOW (ref 98–111)
Chloride: 98 mmol/L (ref 98–111)
Creatinine, Ser: 2.56 mg/dL — ABNORMAL HIGH (ref 0.61–1.24)
Creatinine, Ser: 2.87 mg/dL — ABNORMAL HIGH (ref 0.61–1.24)
GFR, Estimated: 29 mL/min — ABNORMAL LOW (ref >60)
GFR, Estimated: 25 mL/min — ABNORMAL LOW (ref >60)
Glucose, Bld: 98 mg/dL (ref 70–99)
Glucose, Bld: 109 mg/dL — ABNORMAL HIGH (ref 70–99)
Potassium: 3.7 mmol/L (ref 3.5–5.1)
Potassium: 3.9 mmol/L (ref 3.5–5.1)
Sodium: 138 mmol/L (ref 135–145)
Sodium: 136 mmol/L (ref 135–145)

## 2024-03-18 LAB — FOLATE: Folate: 7.4 ng/mL (ref >5.9)

## 2024-03-18 LAB — LACTIC ACID, PLASMA
Lactic Acid, Venous: 1 mmol/L (ref 0.5–1.9)
Lactic Acid, Venous: 0.6 mmol/L (ref 0.5–1.9)

## 2024-03-18 LAB — URINALYSIS, ROUTINE W REFLEX MICROSCOPIC
Bilirubin Urine: NEGATIVE
Glucose, UA: NEGATIVE mg/dL
Ketones, ur: NEGATIVE mg/dL
Leukocytes,Ua: NEGATIVE
Nitrite: NEGATIVE
Protein, ur: 100 mg/dL — AB
Specific Gravity, Urine: 1.011 (ref 1.005–1.030)
pH: 5 (ref 5.0–8.0)

## 2024-03-18 LAB — HEPATIC FUNCTION PANEL
ALT: 12 U/L (ref 0–44)
AST: 53 U/L — ABNORMAL HIGH (ref 15–41)
Albumin: 2.5 g/dL — ABNORMAL LOW (ref 3.5–5.0)
Alkaline Phosphatase: 54 U/L (ref 38–126)
Bilirubin, Direct: 0.3 mg/dL — ABNORMAL HIGH (ref 0.0–0.2)
Indirect Bilirubin: 0.8 mg/dL (ref 0.3–0.9)
Total Bilirubin: 1.1 mg/dL (ref 0.0–1.2)
Total Protein: 5.7 g/dL — ABNORMAL LOW (ref 6.5–8.1)

## 2024-03-18 LAB — CK: Total CK: 2590 U/L — ABNORMAL HIGH (ref 49–397)

## 2024-03-18 LAB — AMMONIA: Ammonia: 37 umol/L — ABNORMAL HIGH (ref 9–35)

## 2024-03-18 LAB — TROPONIN I (HIGH SENSITIVITY)
Troponin I (High Sensitivity): 2626 ng/L (ref <18)
Troponin I (High Sensitivity): 2719 ng/L (ref <18)

## 2024-03-18 LAB — HEMOGLOBIN A1C
Hgb A1c MFr Bld: 5 % (ref 4.8–5.6)
Mean Plasma Glucose: 96.8 mg/dL

## 2024-03-18 LAB — VITAMIN B12: Vitamin B-12: 160 pg/mL — ABNORMAL LOW (ref 180–914)

## 2024-03-18 LAB — PROCALCITONIN: Procalcitonin: 0.16 ng/mL

## 2024-03-18 MED ORDER — HEPARIN (PORCINE) 25000 UT/250ML-% IV SOLN
1900.0000 [IU]/h | INTRAVENOUS | Status: DC
  Administered 2024-03-19: 1800 [IU]/h via INTRAVENOUS
  Administered 2024-03-19: 1900 [IU]/h via INTRAVENOUS
  Filled 2024-03-18 (×2): qty 250

## 2024-03-18 MED ORDER — SODIUM CHLORIDE 0.9 % IV BOLUS
1000.0000 mL | Freq: Once | INTRAVENOUS | Status: AC
Start: 2024-03-18 — End: 2024-03-18
  Administered 2024-03-18: 1000 mL via INTRAVENOUS

## 2024-03-18 MED ORDER — CHLORHEXIDINE GLUCONATE CLOTH 2 % EX PADS
6.0000 | MEDICATED_PAD | Freq: Every day | CUTANEOUS | Status: DC
  Administered 2024-03-18 – 2024-03-22 (×5): 6 via TOPICAL

## 2024-03-18 MED ORDER — HEPARIN BOLUS VIA INFUSION
5000.0000 [IU] | Freq: Once | INTRAVENOUS | Status: AC
  Administered 2024-03-19: 5000 [IU] via INTRAVENOUS
  Filled 2024-03-18: qty 5000

## 2024-03-18 MED ORDER — SODIUM CHLORIDE 0.9 % IV SOLN: INTRAVENOUS | Status: DC

## 2024-03-18 MED ORDER — NICOTINE 21 MG/24HR TD PT24
21.0000 mg | MEDICATED_PATCH | Freq: Every day | TRANSDERMAL | Status: DC
  Administered 2024-03-19 – 2024-03-21 (×3): 21 mg via TRANSDERMAL
  Filled 2024-03-18 (×4): qty 1

## 2024-03-18 MED ORDER — OXYCODONE-ACETAMINOPHEN 5-325 MG PO TABS: 1.0000 | ORAL_TABLET | ORAL | Status: DC | PRN

## 2024-03-18 NOTE — Progress Notes (Addendum)
   03/18/24 0849  TOC Brief Assessment  Insurance and Status Reviewed  Patient has primary care physician Yes  Home environment has been reviewed home with wife  Prior level of function: independent  Prior/Current Home Services No current home services  Social Drivers of Health Review SDOH reviewed no interventions necessary  Transition of care needs  (see note)    Spoke to wife at bedside. Patient in restroom. Wife concerned due to patient being on oxygen  at the hospital and not having home oxygen . Also , wife states he has been confused which is improving but he is still confused . Wife stated she has spoken to Dr Larrie Po this am. NCM will arrange HHPT for patient and order bedside commode. NCM called Dr Larrie Po office, currently not open . Left message for Henreitta Locus with wife's concerns and asking for signature for bedside commode. Secure chatted bedside nurse   Ordered bedside commode with Jermaine with [employer]   Amy with [facility] accepted referral for HHPT   [phone] Called Dr Larrie Po office spoke to Amalia Badder regarding patient's wife's concerns and asked if Dr Larrie Po can sign Bedside commode/3 in 1 order   Henreitta Locus  returned call and will talk to patient's wife

## 2024-03-18 NOTE — Progress Notes (Signed)
 PT Cancellation Note  Patient Details Name: Tony Campos MRN: [id number] DOB: [birth year]   Cancelled Treatment:    Reason Eval/Treat Not Completed: Other (comment) Held PT session as pt was getting back into bed with RN, who was readying to place foley;  Have discussed status with OT and RN;  Will continue to follow;   Darcus Eastern, PT  Acute Rehabilitation Services Office [phone] Secure Chat welcomed    Marcial Setting 03/18/2024, 2:23 PM

## 2024-03-18 NOTE — H&P (Signed)
 History and Physical    Patient: Tony Campos ZOX:[id number] DOB: [birth year] DOA: 03/12/2024 DOS: the patient was seen and examined on 03/18/2024 PCP: Austine Lefort, MD  Patient coming from:  Neuro surgery service.  Chief complaint: No chief complaint on file.  HPI:  Tony Campos is a 58 y.o. male with past medical history  of abdominal aortic aneurysm without rupture last imaged in February 2025 on an MRI showing a size of 4 cm with recommendations of annual vascular surgery referral, morbid obesity with a BMI of 37.30, sleep apnea on inspire, GERD, history of anemia, bipolar disorder/depression, essential hypertension, hypothyroidism, PAD, SIADH, current tobacco abuse restarted about 3 weeks ago, hypogonadism, cholecystectomy, on neurosurgery service asked to transfer patient and manage for medical issues particularly patient's weakness, hypoxia, AKI.  Patient was admitted on March 12, 2024 for L4-5 decompression.  Patient is not able to participate in a detailed HPI he is awake but disoriented extremely somnolent difficult to wake up, chart review states that wife was concerned about patient's confusion.  Chart review shows that patient is noted to be hypoxic on March 15, 2024 and discharge was canceled.  Review of Systems  Unable to perform ROS: Other (Patient extremely somnolent)   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   Sleep apnea 12/2015   No cpap, has Inspire   Smoker    Spondylosis    Testosterone  deficiency     Past Surgical History:  Procedure Laterality Date   APPENDECTOMY  12/2018   at [facility]   CHOLECYSTECTOMY  2007   CLOSED REDUCTION MANDIBLE N/A 03/09/2018   Procedure: CLOSED REDUCTION MANDIBULAR MMF;  Surgeon: Ammon Bales, MD;  Location: [facility] OR;  Service: ENT;  Laterality: N/A;   COLONOSCOPY  07/26/2020   10/22/2018   DRUG INDUCED ENDOSCOPY N/A 04/06/2021   Procedure: DRUG INDUCED ENDOSCOPY;  Surgeon: Virgina Grills, MD;  Location: [facility];  Service: ENT;  Laterality: N/A;   IMPLANTATION OF HYPOGLOSSAL NERVE STIMULATOR Right 07/20/2021   Procedure: IMPLANTATION OF HYPOGLOSSAL NERVE STIMULATOR;  Surgeon: Virgina Grills, MD;  Location: [facility];  Service: ENT;  Laterality: Right;   KNEE SURGERY     3 on right knee and 1 on left knee   LAPAROSCOPIC APPENDECTOMY N/A 07/25/2018   Procedure: APPENDECTOMY LAPAROSCOPIC;  Surgeon: Alanda Allegra, MD;  Location: [facility];  Service: General;  Laterality: N/A;   MANDIBULAR HARDWARE REMOVAL N/A 05/06/2018   Procedure: REMOVAL OF MMF HARDWARE;  Surgeon: Ammon Bales, MD;  Location: [facility];  Service: ENT;  Laterality: N/A;   NASAL SEPTUM SURGERY     NASAL SINUS SURGERY     POLYPECTOMY     right sfa recanalization and stenting  Right 01/12/2016   [facility]   UPPER GASTROINTESTINAL ENDOSCOPY  07/26/2020   WRIST SURGERY     right and left    reports that he  has been smoking cigarettes. He started smoking about 3 weeks ago. He has been smoking an average 0.3 packs per day for the past 0.1 years. He has been exposed to tobacco smoke. He has never used smokeless tobacco. He reports that he does not drink alcohol and does not use drugs. No Known Allergies Family History  Problem Relation Age of Onset   Other Father        killed in [place]   Cancer Maternal Grandfather        stomach   Stomach cancer Maternal Grandfather    Colon cancer Neg Hx    Colitis Neg Hx    Esophageal cancer Neg Hx     Rectal cancer Neg Hx    Prior to Admission medications   Medication Sig Start Date End Date Taking? Authorizing Provider  Alpha-Lipoic Acid 600 MG CAPS Take 1 capsule by mouth 2 (two) times daily. Takes with Lyrica    Yes [provider]  aspirin EC 81 MG tablet Take 81 mg by mouth daily.   Yes [provider]  baclofen  (LIORESAL ) 10 MG tablet Take 10 mg by mouth 3 (three) times daily.   Yes [provider]  brexpiprazole  (REXULTI ) 2 MG TABS tablet Take 2 mg by mouth daily. 02/06/22  Yes [provider]  clonazePAM  (KLONOPIN ) 1 MG tablet Take 1 mg by mouth 2 (two) times daily.   Yes [provider]  DULoxetine  (CYMBALTA ) 60 MG capsule Take 60 mg by mouth daily. 07/09/20  Yes [provider]  ferrous sulfate  325 (65 FE) MG tablet Take 325 mg by mouth daily.   Yes [provider]  lamoTRIgine  (LAMICTAL ) 200 MG tablet Take 200 mg by mouth 2 (two) times daily. 07/20/20  Yes [provider]  levothyroxine  (SYNTHROID ) 125 MCG tablet take 1 tablet (125 microgram total) by mouth daily before breakfast. 01/10/24  Yes Austine Lefort, MD  losartan  (COZAAR ) 50 MG tablet take 1 tablet (50 milligram total) by mouth daily. 01/10/24  Yes Austine Lefort, MD  metoprolol  tartrate (LOPRESSOR ) 25 MG tablet take (1) tablet twice daily. 01/10/24  Yes Austine Lefort, MD  morphine  (MS CONTIN ) 15 MG 12 hr tablet Take 15 mg by mouth 3 (three) times daily. 11/24/21  Yes [provider]  NARCAN 4 MG/0.1ML LIQD nasal spray kit Place 1 spray into the nose once. 09/08/20  Yes [provider]  oxyCODONE -acetaminophen  (PERCOCET) 5-325 MG tablet Take 1 tablet by mouth every 6 (six) hours as needed for severe pain (pain score 7-10) (Postoeprative pain). 03/13/24 03/13/25 Yes Bergman, Meghan D, NP  pantoprazole  (PROTONIX ) 40 MG tablet TAKE (1) TABLET TWICE DAILY. 09/28/23  Yes Austine Lefort, MD  pregabalin  (LYRICA ) 150 MG capsule Take 150  mg by mouth 2 (two) times daily. 03/02/21  Yes [provider]  Solriamfetol  HCl (SUNOSI ) 150 MG TABS Take 150 mg by mouth daily.   Yes [provider]  valACYclovir  (VALTREX ) 500 MG tablet take 1 tablet (500 milligram total) by mouth daily. 01/17/24  Yes Austine Lefort, MD  vitamin B-12 (CYANOCOBALAMIN ) 500 MCG tablet Take 500 mcg by mouth daily.   Yes [provider]  Vitamin E 180 MG (400 UNIT) CAPS Take 400 Units by mouth daily.   Yes [provider]  cyclobenzaprine  (FLEXERIL ) 10 MG tablet Take 1 tablet (10 mg total) by mouth 3 (three) times daily as needed for muscle spasms. 03/13/24   Bergman, Meghan D, NP  docusate sodium  (COLACE)  100 MG capsule Take 1 capsule (100 mg total) by mouth 2 (two) times daily. 03/13/24   Bergman, Meghan D, NP  rosuvastatin  (CRESTOR ) 40 MG tablet take 1 tablet (40 MILLIGRAM total) by mouth daily. 03/17/24   Austine Lefort, MD  esomeprazole (NEXIUM) 40 MG capsule Take 40 mg by mouth daily before breakfast.  03/19/13  [provider]                                                                                 Vitals:   03/18/24 0748 03/18/24 0830 03/18/24 1410 03/18/24 1848  BP: 105/79  122/73   Pulse: 98  87   Resp: 17  17   Temp: 98.4 F (36.9 C)  (!) 100.8 F (38.2 C)   TempSrc:      SpO2: (!) 85% 90%  92%  Weight:      Height:       Physical Exam Vitals and nursing note reviewed.  Constitutional:      General: He is sleeping. He is not in acute distress.    Appearance: He is morbidly obese.  HENT:     Head: Normocephalic and atraumatic.     Right Ear: Hearing and external ear normal.     Left Ear: Hearing and external ear normal.     Nose: Nose normal. No nasal deformity.     Mouth/Throat:     Lips: Pink.     Mouth: Mucous membranes are moist.     Tongue: No lesions. Tongue does not deviate from midline.  Eyes:     General: Lids are normal.     Extraocular Movements: Extraocular movements  intact.  Cardiovascular:     Rate and Rhythm: Normal rate and regular rhythm.     Heart sounds: Normal heart sounds.  Pulmonary:     Effort: Pulmonary effort is normal.     Breath sounds: Normal breath sounds.  Abdominal:     General: Abdomen is protuberant. Bowel sounds are normal. There is no distension.     Palpations: Abdomen is soft. There is no mass.     Tenderness: There is no abdominal tenderness. There is no right CVA tenderness or left CVA tenderness.  Musculoskeletal:     Right lower leg: No edema.     Left lower leg: No edema.  Skin:    General: Skin is warm.  Neurological:     General: No focal deficit present.     Mental Status: He is lethargic and disoriented.     GCS: GCS eye subscore is 3. GCS verbal subscore is 4. GCS motor subscore is 5.     Cranial Nerves: Cranial nerves 2-12 are intact. No dysarthria or facial asymmetry.     Motor: Motor function is intact. No tremor.  Psychiatric:        Speech: Speech normal.     Labs on Admission: I have personally reviewed following labs and imaging studies CBC: Recent Labs  Lab 03/18/24 1458  WBC 8.4  NEUTROABS 6.3  HGB 8.4*  HCT 25.0*  MCV 89.6  PLT 204   Basic Metabolic Panel: Recent Labs  Lab 03/18/24 0848  NA 138  K  3.7  CL 96*  CO2 25  GLUCOSE 98  BUN 38*  CREATININE 2.56*  CALCIUM  9.0   CBG: Recent Labs  Lab 03/14/24 2256  GLUCAP 110*   Lipid Profile: No results for input(s): "CHOL", "HDL", "LDLCALC", "TRIG", "CHOLHDL", "LDLDIRECT" in the last 72 hours. Thyroid  Function Tests: No results for input(s): "TSH", "T4TOTAL", "FREET4", "T3FREE", "THYROIDAB" in the last 72 hours. Anemia Panel: No results for input(s): "VITAMINB12", "FOLATE", "FERRITIN", "TIBC", "IRON", "RETICCTPCT" in the last 72 hours. Urine analysis:    Component Value Date/Time   COLORURINE YELLOW 03/18/2024 1342   APPEARANCEUR HAZY (A) 03/18/2024 1342   LABSPEC 1.011 03/18/2024 1342   PHURINE 5.0 03/18/2024 1342    GLUCOSEU NEGATIVE 03/18/2024 1342   HGBUR SMALL (A) 03/18/2024 1342   BILIRUBINUR NEGATIVE 03/18/2024 1342   KETONESUR NEGATIVE 03/18/2024 1342   PROTEINUR 100 (A) 03/18/2024 1342   UROBILINOGEN 0.2 11/10/2013 1522   NITRITE NEGATIVE 03/18/2024 1342   LEUKOCYTESUR NEGATIVE 03/18/2024 1342   Radiological Exams on Admission: DG Chest 1 View Result Date: 03/18/2024 CLINICAL DATA:  Fever EXAM: CHEST  1 VIEW COMPARISON:  December 12, 2023 FINDINGS: Bilateral reticular interstitial infiltrates, edema versus pneumonia, correlate clinically. Minimal bilateral basilar atelectatic changes without consolidations No pleural effusion Heart upper limits of normal Stimulator device of the lower neck projections of the right hemithorax IMPRESSION: Bilateral reticular interstitial infiltrates, edema versus pneumonia, correlate clinically. Electronically Signed   By: Fredrich Jefferson M.D.   On: 03/18/2024 15:32   Data Reviewed: Relevant notes from primary care and specialist visits, past discharge summaries as available in EHR, including Care Everywhere. Prior diagnostic testing as pertinent to current admission diagnoses, Updated medications and problem lists for reconciliation ED course, including vitals, labs, imaging, treatment and response to treatment,Triage notes, nursing and pharmacy notes and ED provider's notes Notable results as noted in HPI.Discussed case with EDMD/ ED APP/ or Specialty MD on call and as needed.  Assessment & Plan  Patient admitted to neurosurgery service for low back surgery on the 16th was doing well initially and noted to be hypoxic on the 19th and documented in PT notes.  Wife's concern in chart for patient's confusion.  Hospitalist team requested to evaluate and take on our service. >> Altered mental status/somnolence: Due to patient's multiple risk factors history of tobacco abuse obesity sleep apnea there are multiple differentials and we will have to start with a comprehensive  evaluation.  Will  MRI of the brain for acute mental status change this could also be a stroke exam is nonfocal so less likely.  Comprehensive metabolic panel, thyroid  testing, B12, folate, troponin, dimer,ABG, Lactic.  Will also obtain an EEG.  Low threshold for neurology consult.cultures.  Lamictal  level.Echocardiogram.   >>AKI: Lab Results  Component Value Date   CREATININE 2.56 (H) 03/18/2024   CREATININE 1.07 03/06/2024   CREATININE 0.83 09/28/2023  Patient had nearly 800 mL of urine on bladder scan and indwelling Foley catheter placed output since then has improved along with hydration.  CT abd to evaluate for any hydronephrosis.  Will hold patient's metoprolol .Suspect from low blood pressure.  U/A shows essentially normal urine with small hemoglobin.    >> Obesity with a BMI of 37.30: A1c, ABG to assess for hypercapnia.  >> Hypothyroidism: TFTs ordered.  >> Tobacco abuse: Nicotine  patch counseling once patient is awake.  >> Essential hypertension: Vitals:   03/16/24 0731 03/16/24 1735 03/16/24 2012 03/16/24 2012  BP: 105/64 119/76 (!) 112/97 (!) 112/97   03/17/24  0423 03/17/24 0719 03/17/24 1419 03/17/24 1634  BP: 117/76 132/75 (!) 95/47 (!) 96/52   03/17/24 2238 03/18/24 0456 03/18/24 0748 03/18/24 1410  BP: 98/64 94/73 105/79 122/73  Hold patient's metoprolol  continue with IV fluid hydration.   DVT prophylaxis:  SCD's Consults:  Neurology  Advance Care Planning:    Code Status: Full Code   Family Communication:  None  Disposition Plan:  Home.  Severity of Illness: The appropriate patient status for this patient is INPATIENT. Inpatient status is judged to be reasonable and necessary in order to provide the required intensity of service to ensure the patient's safety. The patient's presenting symptoms, physical exam findings, and initial radiographic and laboratory data in the context of their chronic comorbidities is felt to place them at high risk for further  clinical deterioration. Furthermore, it is not anticipated that the patient will be medically stable for discharge from the hospital within 2 midnights of admission.   * I certify that at the point of admission it is my clinical judgment that the patient will require inpatient hospital care spanning beyond 2 midnights from the point of admission due to high intensity of service, high risk for further deterioration and high frequency of surveillance required.*  Unresulted Labs (From admission, onward)     Start     Ordered   03/19/24 0500  CBC with Differential/Platelet  Tomorrow morning,   R       Question:  Specimen collection method  Answer:  Lab=Lab collect   03/18/24 1938   03/19/24 0500  Comprehensive metabolic panel with GFR  Tomorrow morning,   R       Question:  Specimen collection method  Answer:  Lab=Lab collect   03/18/24 1938   03/19/24 0500  Phosphorus  Tomorrow morning,   R       Question:  Specimen collection method  Answer:  Lab=Lab collect   03/18/24 1938   03/18/24 1938  CK  Add-on,   AD       Question:  Specimen collection method  Answer:  Lab=Lab collect   03/18/24 1938   03/18/24 1933  Folate  Add-on,   AD       Question:  Specimen collection method  Answer:  Lab=Lab collect   03/18/24 1932   03/18/24 1932  Vitamin B12  Add-on,   AD       Question:  Specimen collection method  Answer:  Lab=Lab collect   03/18/24 1932   03/18/24 1825  Ammonia  Add-on,   AD       Question:  Specimen collection method  Answer:  Lab=Lab collect   03/18/24 1824   03/18/24 1823  Procalcitonin  Add-on,   AD       References:    Procalcitonin Lower Respiratory Tract Infection AND Sepsis Procalcitonin Algorithm  Question:  Specimen collection method  Answer:  Lab=Lab collect   03/18/24 1822   03/18/24 1818  Blood gas, arterial  Once,   R        03/18/24 1818   03/18/24 1443  Culture, blood (Routine X 2) w Reflex to ID Panel  BLOOD CULTURE X 2,   R      03/18/24 1442   03/18/24 1443   Urine Culture (for pregnant, neutropenic or urologic patients or patients with an indwelling urinary catheter)  (Urine Labs)  Once,   R       Question:  Indication  Answer:  Sepsis   03/18/24 1442  03/18/24 1435  Lamotrigine  level  Once,   R       Question:  Specimen collection method  Answer:  Lab=Lab collect   03/18/24 1434            Orders Placed This Encounter  Procedures   For home use only DME 3 n 1   Culture, blood (Routine X 2) w Reflex to ID Panel   Urine Culture (for pregnant, neutropenic or urologic patients or patients with an indwelling urinary catheter)   DG Lumbar Spine 2-3 Views   DG Lumbar Spine 1 View   DG C-Arm 1-60 Min-No Report   DG Chest 1 View   MR BRAIN WO CONTRAST   CT ABDOMEN PELVIS WO CONTRAST   Glucose, capillary   Basic metabolic panel   Urinalysis, Routine w reflex microscopic -Urine, Catheterized   Lamotrigine  level   Hepatic function panel   Lactic acid, plasma   CBC with Differential/Platelet   Blood gas, arterial   D-dimer, quantitative   Hemoglobin A1c   Procalcitonin   Ammonia   Vitamin B12   Folate   CK   CBC with Differential/Platelet   Comprehensive metabolic panel with GFR   Phosphorus   Diet regular Room service appropriate? Yes; Fluid consistency: Thin   Diet - low sodium heart healthy   Diet - low sodium heart healthy   Pre-admission testing diagnosis   Pre-admission testing diagnosis   Anesthesia Preoperative Order   SCD's   Vital signs with neuro checks   Mobilize POD 0 at least 1x per shift, POD 1 at least 2x per shift.  First ambulation no later than 8am After 48 hours up ad lib   Activity as tolerated   Ambulate in hall early AM   May sit in chair   Apply Aspen Lumbar Brace   Maintain Aspen Lumbar Brace   Brace application   Advance diet as tolerated   Intake and output   If unable to void, assess perineal sensation. If sensation is decreased, notify provider; if sensation is intact, may catheterize every 6  hours PRN.   If drain present - empty, recharge and record output   Initiate Oral Care Protocol   Initiate Carrier Fluid Protocol   Apply Spinal Surgery Care Plan   Discontinue hemovac   Increase activity slowly   Call MD for:  persistant nausea and vomiting   Call MD for:  severe uncontrolled pain   Call MD for:  redness, tenderness, or signs of infection (pain, swelling, redness, odor or green/yellow discharge around incision site)   Call MD for:  difficulty breathing, headache or visual disturbances   Call MD for:  hives   Call MD for:  persistant dizziness or light-headedness   If the dressing is still on your incision site when you go home, remove it on the third day after your surgery date. Remove dressing if it begins to fall off, or if it is dirty or damaged before the third day.   Care order/instruction: Decrease the patient's oxygen  to 2 L nasal cannula. Ambulate the patient 3 times a day in the hallway.   Discharge instructions   Increase activity slowly   May shower / Bathe   Driving Restrictions   Lifting restrictions   Call MD for:  temperature >100.4   Call MD for:  persistant nausea and vomiting   Call MD for:  severe uncontrolled pain   Call MD for:  redness, tenderness, or signs of infection (pain, swelling,  redness, odor or green/yellow discharge around incision site)   Call MD for:  difficulty breathing, headache or visual disturbances   Call MD for:  hives   Call MD for:  persistant dizziness or light-headedness   Call MD for:  extreme fatigue   Remove dressing in 48 hours   Face-to-face encounter (required for Medicare/Medicaid patients)   Home Health   Bladder scan   Cardiac Monitoring - Continuous Indefinite   Full code   Consult to Transition of Care Team   OT eval and treat   PT eval and treat   Incentive spirometry  every 2 hours while awake for 48 hours then QID and prn.   Pulse oximetry, continuous   EKG 12-Lead   ECHOCARDIOGRAM COMPLETE BUBBLE  STUDY   ABO/Rh   Change IV to NSL    Outpatient with extended recovery   Place in observation (patient's expected length of stay will be less than 2 midnights)   Precautions:  No bending, arching, or twisting   Aspiration precautions   Fall precautions    Author: Lavanda Porter, MD 12 pm -8 pm. 03/18/2024 7:40 PM >>Please note for any concern,or critical results after hours past 8pm please contact the [facility] hospitalist [facility] floor coverage provider from 7 PM- 7 AM. For on call review [email or web address], username [email or web address] and PW: your phone number<<

## 2024-03-18 NOTE — Progress Notes (Addendum)
 Patient arrived to 3E30 via 5N RN. Patient is minimally rousable but able to answer questions with prompting. Order obtained to maintain foley placed afternoon 4/22. Notified by CT that patient labs do not support the ability to obtain CT with contrast. Order changed to CT without contrast.  Surgical wound noted on lumbar spine. Redness noted around the edges with a round, yellow spot lacking drainage approximately 1.3cmx1.7cm.  Patient remains too lethargic to take POs at this time.  Notified by CT that patient labs are not yet within limits to tolerate contrast. CT delayed until this time. Notified by MRI that patient will need remote control to "stimulator" to be brought down. No object matching that description appears to be at patient's bedside. Notified 5N and requested prior room be checked.  Stimulator device located.

## 2024-03-18 NOTE — Progress Notes (Signed)
 PHARMACY - ANTICOAGULATION CONSULT NOTE  Pharmacy Consult for Heparin  Indication: pulmonary embolus  No Known Allergies  Patient Measurements: Height: 6' (182.9 cm) Weight: 123.2 kg (271 lb 9.7 oz) IBW/kg (Calculated) : 77.6 HEPARIN  DW (KG): 105.3  Vital Signs: Temp: 98.1 F (36.7 C) (04/22 2234) Temp Source: Axillary (04/22 2234) BP: 150/117 (04/22 2234) Pulse Rate: 76 (04/22 2234)  Labs: Recent Labs    03/18/24 0848 03/18/24 1458 03/18/24 1853 03/18/24 1938 03/18/24 2116  HGB  --  8.4*  --   --   --   HCT  --  25.0*  --   --   --   PLT  --  204  --   --   --   CREATININE 2.56*  --   --   --  2.87*  CKTOTAL  --   --   --  2,590*  --   TROPONINIHS  --   --  2,626*  --  2,719*    Estimated Creatinine Clearance: 39.4 mL/min (A) (by C-G formula based on SCr of 2.87 mg/dL (H)).   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   Sleep apnea 12/2015   No cpap, has Inspire   Smoker    Spondylosis    Testosterone  deficiency     Medications:  Scheduled:   brexpiprazole   2 mg Oral Daily   Chlorhexidine  Gluconate Cloth  6 each Topical Q0600   clonazePAM   1 mg Oral BID   DULoxetine   60 mg Oral Daily   ferrous sulfate   325 mg Oral QHS   lamoTRIgine   200 mg Oral BID   levothyroxine   125 mcg Oral Q0600   morphine   15 mg Oral Q12H   nicotine   21 mg Transdermal Daily   pantoprazole   40 mg Oral Daily   pregabalin   150 mg Oral BID   rosuvastatin   40 mg Oral QHS   sodium chloride  flush  3 mL  Intravenous Q12H   valACYclovir   500 mg Oral Daily    Assessment: 58 y.o. male  s/p L4-5 lumbar fusion 4/16, now with AMS, AKI, possible PE awaiting VQ scan for heparin   Goal of Therapy:  Heparin  level 0.3-0.7 units/ml Monitor platelets by anticoagulation protocol: Yes   Plan:  Heparin  5000 units IV bolus, then start heparin  1800 units/hr Check heparin  level in 8 hours.   Carlota Chestnut 03/18/2024,11:46 PM

## 2024-03-18 NOTE — Progress Notes (Signed)
       Overnight   NAME: ZANNIE RUNKLE MRN: [id number] DOB : 01/09/1966    Date of Service   03/18/2024   HPI/Events of Note    Notified by Attending for follow on patient with pending labs.  Decision made to transfer patient to more specific care.   Interventions/ Plan   Transfer to Progressive Floor. Labs -pending  Imaging - determin by lab values      Denece Finger BSN MSNA MSN ACNPC-AG Acute Care Nurse Practitioner [facility]

## 2024-03-18 NOTE — Progress Notes (Signed)
 Patient with lethargy and increased confusion this evening. Oriented to person and time. Dr. Emireth Cockerham Sams at bedside. New orders for tele, EKG, labs, ABG, D-dimer, MRI brain, continuous pulse ox.

## 2024-03-18 NOTE — Progress Notes (Signed)
 Patient unable to void. Bladder scan of . Cr at 2.56. Patient drowsy, poor attention, and unsteady ambulation when transferring to bed. MD Larrie Po made aware. New orders for foley, UA, and cancelled discharge.

## 2024-03-18 NOTE — Discharge Summary (Signed)
 Physician Discharge Summary  Patient ID: Tony Campos MRN: [id number] DOB/AGE: 1966/08/12 58 y.o.  Admit date: 03/12/2024 Discharge date: 03/18/2024  Admission Diagnoses: Lumbar herniated disc, lumbago, lumbar radiculopathy, lumbar spondylolisthesis  Discharge Diagnoses: The same Principal Problem:   Spondylolisthesis of lumbar region   Discharged Condition: fair  Hospital Course: I performed an L4-5 decompression, instrumentation and fusion on the patient on 03/12/2024.  The surgery went well.  The patient's postoperative course was remarkable for difficulty with pain control.  He is in chronic pain management has been on chronic opioid medications.  He also had some mild confusion which was attributed to the hospital setting, medications, pain medications, etc.  This has resolved.  On 03/18/2024 the patient was ready for discharge.  The patient, his wife, were given verbal and written discharge instructions.  All their questions were answered.  Consults: PT, care management, rehab Significant Diagnostic Studies: None Treatments: L4-5 decompression, instrumentation and fusion. Discharge Exam: Blood pressure 105/79, pulse 98, temperature 98.4 F (36.9 C), resp. rate 17, height 6' (1.829 m), weight 124.7 kg, SpO2 (!) 85%. The patient is alert and pleasant.  His strength is normal.  His wound is healing well.  Disposition: Home with home physical therapy.  Discharge Instructions     Call MD for:  difficulty breathing, headache or visual disturbances   Complete by: As directed    Call MD for:  difficulty breathing, headache or visual disturbances   Complete by: As directed    Call MD for:  extreme fatigue   Complete by: As directed    Call MD for:  hives   Complete by: As directed    Call MD for:  hives   Complete by: As directed    Call MD for:  persistant dizziness or light-headedness   Complete by: As directed    Call MD for:  persistant dizziness or light-headedness    Complete by: As directed    Call MD for:  persistant nausea and vomiting   Complete by: As directed    Call MD for:  persistant nausea and vomiting   Complete by: As directed    Call MD for:  redness, tenderness, or signs of infection (pain, swelling, redness, odor or green/yellow discharge around incision site)   Complete by: As directed    Call MD for:  redness, tenderness, or signs of infection (pain, swelling, redness, odor or green/yellow discharge around incision site)   Complete by: As directed    Call MD for:  severe uncontrolled pain   Complete by: As directed    Call MD for:  severe uncontrolled pain   Complete by: As directed    Call MD for:  temperature >100.4   Complete by: As directed    Diet - low sodium heart healthy   Complete by: As directed    Diet - low sodium heart healthy   Complete by: As directed    Discharge instructions   Complete by: As directed    Call [phone] for a followup appointment. Take a stool softener while you are using pain medications.   Driving Restrictions   Complete by: As directed    Do not drive for 2 weeks.   If the dressing is still on your incision site when you go home, remove it on the third day after your surgery date. Remove dressing if it begins to fall off, or if it is dirty or damaged before the third day.   Complete by: As directed  Increase activity slowly   Complete by: As directed    Increase activity slowly   Complete by: As directed    Lifting restrictions   Complete by: As directed    Do not lift more than 5 pounds. No excessive bending or twisting.   May shower / Bathe   Complete by: As directed    Remove the dressing for 3 days after surgery.  You may shower, but leave the incision alone.   Remove dressing in 48 hours   Complete by: As directed          Follow-up Information     Garry Kansas, MD. Go on 04/22/2024.   Specialty: Neurosurgery Why: First post op appointment with x-rays is on  04/22/2024 at 1:45 PM. Contact information: [address] [place] [address] [phone]                 Signed: [name] 03/18/2024, 7:59 AM

## 2024-03-18 NOTE — Progress Notes (Signed)
 RT placed pt on 6L HFNC due desaturation

## 2024-03-18 NOTE — Progress Notes (Signed)
 Called both number  and left VM on mobile.  No answer.

## 2024-03-18 NOTE — Progress Notes (Signed)
 Occupational Therapy Treatment Patient Details Name: Tony Campos MRN: [id number] DOB: November 18, 1966 Today's Date: 03/18/2024   History of present illness Patient is 58 y.o. male s/p L4-5 discectomy on 03/12/24 due to spondylolisthesis and recurrent herniated disc. PMH significant for AAA (without rupture), GERD, anxiety, depression, bipolar disorder, HLD, HTN, hypothyroidism, meningitis, PAD, Sleep apnea, R knee surgery x3, L knee surgery x1, appendectomy, cholecystectomy.   OT comments  Pt progressing well towards goals. Back precautions reviewed with pt, pt able to recall 2/3 precautions without cues. Progressed UB dressing to set up while seated. Pt still experiencing posterior lean during standing tasks without UE support. O2 stats measured, while seated in recliner on RA pt, O2 level dropped to 84, unable to rise with pursed lip breathing. Placed on 3L O2, during functional activity O2 levels unable to rise above 90%. Wife expressed concerns above pt's decreased cog and O2 levels for d/c. Concerns communicated to MD. Recommendation of HHOT to optimize independence levels. Will continue to follow acutely.       If plan is discharge home, recommend the following:  A little help with walking and/or transfers;A little help with bathing/dressing/bathroom;Assistance with cooking/housework;Supervision due to cognitive status   Equipment Recommendations  BSC/3in1;Tub/shower seat    Recommendations for Other Services      Precautions / Restrictions Precautions Precautions: Fall;Back Precaution Booklet Issued: Yes (comment) Recall of Precautions/Restrictions: Impaired Precaution/Restrictions Comments: educated pt on 3/3 back prec but will need reinforcement Required Braces or Orthoses: Spinal Brace Spinal Brace: Lumbar corset;Applied in sitting position Restrictions Weight Bearing Restrictions Per Provider Order: No Other Position/Activity Restrictions: wife assisted pt in donning back brace        Mobility Bed Mobility Overal bed mobility: Needs Assistance             General bed mobility comments: Received in recliner    Transfers Overall transfer level: Needs assistance Equipment used: Rolling walker (2 wheels) Transfers: Sit to/from Stand Sit to Stand: Contact guard assist           General transfer comment: With increased time     Balance Overall balance assessment: Needs assistance Sitting-balance support: No upper extremity supported, Feet supported Sitting balance-Leahy Scale: Fair     Standing balance support: Bilateral upper extremity supported, During functional activity, Reliant on assistive device for balance Standing balance-Leahy Scale: Poor Standing balance comment: Reliant on RW, posterior lean during functional task         ADL either performed or assessed with clinical judgement   ADL Overall ADL's : Needs assistance/impaired       Upper Body Dressing : Set up;Sitting   Lower Body Dressing: Minimal assistance;Sit to/from stand Lower Body Dressing Details (indicate cue type and reason): Min assist to thread legs, able to maintain back precautions functionally, posterior lean without UE support       Functional mobility during ADLs: Contact guard assist General ADL Comments: Pt able to maintain back precautions functionally    Extremity/Trunk Assessment Upper Extremity Assessment Upper Extremity Assessment: Overall WFL for tasks assessed   Lower Extremity Assessment Lower Extremity Assessment: Defer to PT evaluation        Vision   Vision Assessment?: No apparent visual deficits         Communication Communication Communication: No apparent difficulties   Cognition Arousal: Alert Behavior During Therapy: Restless, Flat affect Cognition: Cognition impaired   Orientation impairments: Time Awareness: Online awareness impaired Memory impairment (select all impairments): Short-term memory, Declarative long-term  memory Attention  impairment (select first level of impairment): Selective attention Executive functioning impairment (select all impairments): Problem solving, Reasoning, Initiation, Sequencing OT - Cognition Comments: Pt demonstrating difficulty finding words and delayed processing speeds       Following commands: Impaired Following commands impaired: Follows one step commands with increased time      Cueing   Cueing Techniques: Verbal cues        General Comments SpO2 down to 84 on RA sitting, with cues for pursed lip breathing O2 levels only came to 86, replaced on 3L, O2 never above 91 during functional activity with supplemental O2    Pertinent Vitals/ Pain       Pain Assessment Pain Assessment: Faces Faces Pain Scale: Hurts little more Pain Location: back Pain Descriptors / Indicators: Discomfort, Operative site guarding Pain Intervention(s): Monitored during session   Frequency  Min 3X/week        Progress Toward Goals  OT Goals(current goals can now be found in the care plan section)  Progress towards OT goals: Progressing toward goals  Acute Rehab OT Goals Patient Stated Goal: To go home OT Goal Formulation: With patient Time For Goal Achievement: 03/31/24 Potential to Achieve Goals: Good ADL Goals Pt Will Perform Grooming: with supervision;standing Pt Will Perform Upper Body Dressing: with supervision;sitting Pt Will Perform Lower Body Dressing: with supervision;sit to/from stand;sitting/lateral leans Pt Will Transfer to Toilet: with supervision;ambulating;regular height toilet Additional ADL Goal #1: pt will verbalize and adhere to back precautions with min cues during session in prep for ADLs  Plan         AM-PAC OT "6 Clicks" Daily Activity     Outcome Measure   Help from another person eating meals?: None Help from another person taking care of personal grooming?: A Little Help from another person toileting, which includes using toliet, bedpan,  or urinal?: A Little Help from another person bathing (including washing, rinsing, drying)?: A Little Help from another person to put on and taking off regular upper body clothing?: A Little Help from another person to put on and taking off regular lower body clothing?: A Little 6 Click Score: 19    End of Session Equipment Utilized During Treatment: Rolling walker (2 wheels);Back brace  OT Visit Diagnosis: Unsteadiness on feet (R26.81);Other abnormalities of gait and mobility (R26.89);Muscle weakness (generalized) (M62.81)   Activity Tolerance Patient tolerated treatment well   Patient Left in chair;with call bell/phone within reach;Other (comment);with family/visitor present   Nurse Communication Mobility status        Time: [phone] OT Time Calculation (min): 23 min  Charges: OT General Charges $OT Visit: 1 Visit OT Treatments $Self Care/Home Management : 23-37 mins  Delmer Ferraris, OT  Acute Rehabilitation Services Office [phone] Secure chat preferred   Mickael Alamo 03/18/2024, 9:51 AM

## 2024-03-19 ENCOUNTER — Inpatient Hospital Stay (HOSPITAL_COMMUNITY)

## 2024-03-19 ENCOUNTER — Other Ambulatory Visit (HOSPITAL_COMMUNITY): Payer: Self-pay

## 2024-03-19 ENCOUNTER — Observation Stay (HOSPITAL_COMMUNITY)

## 2024-03-19 ENCOUNTER — Other Ambulatory Visit (HOSPITAL_COMMUNITY)

## 2024-03-19 ENCOUNTER — Telehealth (HOSPITAL_COMMUNITY): Payer: Self-pay | Admitting: Pharmacy Technician

## 2024-03-19 DIAGNOSIS — G9341 Metabolic encephalopathy: Secondary | ICD-10-CM

## 2024-03-19 DIAGNOSIS — F419 Anxiety disorder, unspecified: Secondary | ICD-10-CM | POA: Diagnosis not present

## 2024-03-19 DIAGNOSIS — F17210 Nicotine dependence, cigarettes, uncomplicated: Secondary | ICD-10-CM | POA: Diagnosis not present

## 2024-03-19 DIAGNOSIS — N179 Acute kidney failure, unspecified: Secondary | ICD-10-CM | POA: Insufficient documentation

## 2024-03-19 DIAGNOSIS — J9622 Acute and chronic respiratory failure with hypercapnia: Secondary | ICD-10-CM | POA: Diagnosis not present

## 2024-03-19 DIAGNOSIS — I214 Non-ST elevation (NSTEMI) myocardial infarction: Secondary | ICD-10-CM | POA: Diagnosis not present

## 2024-03-19 DIAGNOSIS — J9602 Acute respiratory failure with hypercapnia: Secondary | ICD-10-CM | POA: Diagnosis not present

## 2024-03-19 DIAGNOSIS — J189 Pneumonia, unspecified organism: Secondary | ICD-10-CM | POA: Diagnosis not present

## 2024-03-19 DIAGNOSIS — J9601 Acute respiratory failure with hypoxia: Secondary | ICD-10-CM

## 2024-03-19 DIAGNOSIS — R7989 Other specified abnormal findings of blood chemistry: Secondary | ICD-10-CM

## 2024-03-19 DIAGNOSIS — J811 Chronic pulmonary edema: Secondary | ICD-10-CM | POA: Diagnosis not present

## 2024-03-19 DIAGNOSIS — I10 Essential (primary) hypertension: Secondary | ICD-10-CM | POA: Diagnosis not present

## 2024-03-19 DIAGNOSIS — I714 Abdominal aortic aneurysm, without rupture, unspecified: Secondary | ICD-10-CM | POA: Diagnosis not present

## 2024-03-19 DIAGNOSIS — I2489 Other forms of acute ischemic heart disease: Secondary | ICD-10-CM | POA: Diagnosis not present

## 2024-03-19 DIAGNOSIS — E785 Hyperlipidemia, unspecified: Secondary | ICD-10-CM | POA: Diagnosis not present

## 2024-03-19 DIAGNOSIS — J9621 Acute and chronic respiratory failure with hypoxia: Secondary | ICD-10-CM | POA: Diagnosis not present

## 2024-03-19 DIAGNOSIS — E66812 Obesity, class 2: Secondary | ICD-10-CM | POA: Diagnosis not present

## 2024-03-19 DIAGNOSIS — E538 Deficiency of other specified B group vitamins: Secondary | ICD-10-CM | POA: Diagnosis not present

## 2024-03-19 DIAGNOSIS — Z0389 Encounter for observation for other suspected diseases and conditions ruled out: Secondary | ICD-10-CM | POA: Diagnosis not present

## 2024-03-19 DIAGNOSIS — M5126 Other intervertebral disc displacement, lumbar region: Secondary | ICD-10-CM | POA: Diagnosis not present

## 2024-03-19 DIAGNOSIS — M4316 Spondylolisthesis, lumbar region: Secondary | ICD-10-CM | POA: Diagnosis not present

## 2024-03-19 DIAGNOSIS — E039 Hypothyroidism, unspecified: Secondary | ICD-10-CM | POA: Diagnosis not present

## 2024-03-19 DIAGNOSIS — M6282 Rhabdomyolysis: Secondary | ICD-10-CM | POA: Diagnosis not present

## 2024-03-19 DIAGNOSIS — M5116 Intervertebral disc disorders with radiculopathy, lumbar region: Secondary | ICD-10-CM | POA: Diagnosis not present

## 2024-03-19 DIAGNOSIS — I739 Peripheral vascular disease, unspecified: Secondary | ICD-10-CM | POA: Diagnosis not present

## 2024-03-19 DIAGNOSIS — D62 Acute posthemorrhagic anemia: Secondary | ICD-10-CM | POA: Diagnosis not present

## 2024-03-19 DIAGNOSIS — K5900 Constipation, unspecified: Secondary | ICD-10-CM | POA: Diagnosis not present

## 2024-03-19 DIAGNOSIS — E8729 Other acidosis: Secondary | ICD-10-CM | POA: Diagnosis not present

## 2024-03-19 DIAGNOSIS — F319 Bipolar disorder, unspecified: Secondary | ICD-10-CM | POA: Diagnosis not present

## 2024-03-19 DIAGNOSIS — G4733 Obstructive sleep apnea (adult) (pediatric): Secondary | ICD-10-CM | POA: Diagnosis not present

## 2024-03-19 LAB — ECHOCARDIOGRAM COMPLETE
Area-P 1/2: 4.6 cm2
S' Lateral: 3.6 cm

## 2024-03-19 LAB — CBC WITH DIFFERENTIAL/PLATELET
Abs Immature Granulocytes: 0.04 10*3/uL (ref 0.00–0.07)
Basophils Absolute: 0 10*3/uL (ref 0.0–0.1)
Basophils Relative: 0 %
Eosinophils Absolute: 0 10*3/uL (ref 0.0–0.5)
Eosinophils Relative: 0 %
HCT: 27.7 % — ABNORMAL LOW (ref 39.0–52.0)
Hemoglobin: 9 g/dL — ABNORMAL LOW (ref 13.0–17.0)
Immature Granulocytes: 1 %
Lymphocytes Relative: 22 %
Lymphs Abs: 1.7 10*3/uL (ref 0.7–4.0)
MCH: 29.4 pg (ref 26.0–34.0)
MCHC: 32.5 g/dL (ref 30.0–36.0)
MCV: 90.5 fL (ref 80.0–100.0)
Monocytes Absolute: 0.8 10*3/uL (ref 0.1–1.0)
Monocytes Relative: 10 %
Neutro Abs: 5.2 10*3/uL (ref 1.7–7.7)
Neutrophils Relative %: 67 %
Platelets: 208 10*3/uL (ref 150–400)
RBC: 3.06 MIL/uL — ABNORMAL LOW (ref 4.22–5.81)
RDW: 16.4 % — ABNORMAL HIGH (ref 11.5–15.5)
WBC: 7.8 10*3/uL (ref 4.0–10.5)
nRBC: 0 % (ref 0.0–0.2)

## 2024-03-19 LAB — BRAIN NATRIURETIC PEPTIDE: B Natriuretic Peptide: 228.7 pg/mL — ABNORMAL HIGH (ref 0.0–100.0)

## 2024-03-19 LAB — COMPREHENSIVE METABOLIC PANEL WITH GFR
ALT: 13 U/L (ref 0–44)
AST: 56 U/L — ABNORMAL HIGH (ref 15–41)
Albumin: 2.6 g/dL — ABNORMAL LOW (ref 3.5–5.0)
Alkaline Phosphatase: 57 U/L (ref 38–126)
Anion gap: 12 (ref 5–15)
BUN: 44 mg/dL — ABNORMAL HIGH (ref 6–20)
CO2: 28 mmol/L (ref 22–32)
Calcium: 8.5 mg/dL — ABNORMAL LOW (ref 8.9–10.3)
Chloride: 98 mmol/L (ref 98–111)
Creatinine, Ser: 2.94 mg/dL — ABNORMAL HIGH (ref 0.61–1.24)
GFR, Estimated: 24 mL/min — ABNORMAL LOW (ref >60)
Glucose, Bld: 104 mg/dL — ABNORMAL HIGH (ref 70–99)
Potassium: 5.2 mmol/L — ABNORMAL HIGH (ref 3.5–5.1)
Sodium: 138 mmol/L (ref 135–145)
Total Bilirubin: 1 mg/dL (ref 0.0–1.2)
Total Protein: 5.7 g/dL — ABNORMAL LOW (ref 6.5–8.1)

## 2024-03-19 LAB — LAMOTRIGINE LEVEL: Lamotrigine Lvl: 11 ug/mL (ref 2.0–20.0)

## 2024-03-19 LAB — URINE CULTURE: Culture: NO GROWTH

## 2024-03-19 LAB — TROPONIN I (HIGH SENSITIVITY): Troponin I (High Sensitivity): 2806 ng/L (ref <18)

## 2024-03-19 LAB — TSH: TSH: 0.31 u[IU]/mL — ABNORMAL LOW (ref 0.350–4.500)

## 2024-03-19 LAB — PHOSPHORUS: Phosphorus: 5.6 mg/dL — ABNORMAL HIGH (ref 2.5–4.6)

## 2024-03-19 LAB — HEPARIN LEVEL (UNFRACTIONATED): Heparin Unfractionated: 0.28 [IU]/mL — ABNORMAL LOW (ref 0.30–0.70)

## 2024-03-19 LAB — T4, FREE: Free T4: 1.34 ng/dL — ABNORMAL HIGH (ref 0.61–1.12)

## 2024-03-19 LAB — CK: Total CK: 3556 U/L — ABNORMAL HIGH (ref 49–397)

## 2024-03-19 MED ORDER — CLONAZEPAM 0.5 MG PO TABS
0.5000 mg | ORAL_TABLET | Freq: Two times a day (BID) | ORAL | Status: DC
  Administered 2024-03-19 – 2024-03-22 (×5): 0.5 mg via ORAL
  Filled 2024-03-19 (×7): qty 1

## 2024-03-19 MED ORDER — POLYETHYLENE GLYCOL 3350 17 G PO PACK
17.0000 g | PACK | Freq: Two times a day (BID) | ORAL | Status: AC
  Administered 2024-03-19 (×2): 17 g via ORAL
  Filled 2024-03-19 (×2): qty 1

## 2024-03-19 MED ORDER — ACETAMINOPHEN 500 MG PO TABS
1000.0000 mg | ORAL_TABLET | Freq: Three times a day (TID) | ORAL | Status: DC
  Administered 2024-03-19 – 2024-03-22 (×9): 1000 mg via ORAL
  Filled 2024-03-19 (×9): qty 2

## 2024-03-19 MED ORDER — TECHNETIUM TO 99M ALBUMIN AGGREGATED
4.0000 | Freq: Once | INTRAVENOUS | Status: AC | PRN
  Administered 2024-03-19: 4 via INTRAVENOUS

## 2024-03-19 MED ORDER — POLYETHYLENE GLYCOL 3350 17 G PO PACK: 17.0000 g | PACK | Freq: Two times a day (BID) | ORAL | Status: DC | PRN

## 2024-03-19 MED ORDER — FENTANYL CITRATE PF 50 MCG/ML IJ SOSY
12.5000 ug | PREFILLED_SYRINGE | INTRAMUSCULAR | Status: DC | PRN
  Administered 2024-03-19: 12.5 ug via INTRAVENOUS
  Filled 2024-03-19: qty 1

## 2024-03-19 MED ORDER — HEPARIN SODIUM (PORCINE) 5000 UNIT/ML IJ SOLN
5000.0000 [IU] | Freq: Three times a day (TID) | INTRAMUSCULAR | Status: DC
  Administered 2024-03-20 – 2024-03-22 (×7): 5000 [IU] via SUBCUTANEOUS
  Filled 2024-03-19 (×7): qty 1

## 2024-03-19 MED ORDER — VITAMIN B-12 1000 MCG PO TABS
1000.0000 ug | ORAL_TABLET | Freq: Every day | ORAL | Status: DC
  Administered 2024-03-22: 1000 ug via ORAL
  Filled 2024-03-19 (×2): qty 1

## 2024-03-19 MED ORDER — PREGABALIN 25 MG PO CAPS
50.0000 mg | ORAL_CAPSULE | Freq: Two times a day (BID) | ORAL | Status: DC
  Administered 2024-03-19 – 2024-03-22 (×7): 50 mg via ORAL
  Filled 2024-03-19 (×7): qty 2

## 2024-03-19 MED ORDER — OXYCODONE HCL ER 10 MG PO T12A
10.0000 mg | EXTENDED_RELEASE_TABLET | Freq: Two times a day (BID) | ORAL | Status: DC
  Administered 2024-03-19 – 2024-03-22 (×7): 10 mg via ORAL
  Filled 2024-03-19 (×7): qty 1

## 2024-03-19 MED ORDER — SENNOSIDES-DOCUSATE SODIUM 8.6-50 MG PO TABS
2.0000 | ORAL_TABLET | Freq: Two times a day (BID) | ORAL | Status: AC
  Administered 2024-03-19 (×2): 2 via ORAL
  Filled 2024-03-19 (×2): qty 2

## 2024-03-19 MED ORDER — CYANOCOBALAMIN 1000 MCG/ML IJ SOLN
1000.0000 ug | Freq: Every day | INTRAMUSCULAR | Status: AC
  Administered 2024-03-20 – 2024-03-21 (×2): 1000 ug via INTRAMUSCULAR
  Filled 2024-03-19 (×3): qty 1

## 2024-03-19 NOTE — Progress Notes (Signed)
  Echocardiogram 2D Echocardiogram has been performed.  Fain Home RDCS 03/19/2024, 11:00 AM

## 2024-03-19 NOTE — Plan of Care (Signed)

## 2024-03-19 NOTE — Progress Notes (Signed)
 Subjective: The patient is alert and pleasant.  He is in no apparent distress.  His wife is at the bedside.  He admits to back pain as expected after surgery.  He denies chest pain.  Objective: Vital signs in last 24 hours: Temp:  [97.7 F (36.5 C)-100.8 F (38.2 C)] 98.3 F (36.8 C) (04/23 0751) Pulse Rate:  [72-89] 89 (04/23 0751) Resp:  [14-19] 16 (04/23 0751) BP: (83-150)/(55-117) 115/84 (04/23 0751) SpO2:  [90 %-94 %] 93 % (04/23 0751) Weight:  [120.1 kg-123.2 kg] 120.1 kg (04/23 0426) Estimated body mass index is 35.91 kg/m as calculated from the following:   Height as of this encounter: 6' (1.829 m).   Weight as of this encounter: 120.1 kg.   Intake/Output from previous day: 04/22 0701 - 04/23 0700 In: 1279.8 [P.O.:240; I.V.:37.3; IV Piggyback:1002.5] Out: 1650 [Urine:1650] Intake/Output this shift: No intake/output data recorded.  Physical exam the patient is alert and oriented.  His lower extremity strength is normal.  Lab Results: Recent Labs    03/18/24 1458 03/19/24 0248  WBC 8.4 7.8  HGB 8.4* 9.0*  HCT 25.0* 27.7*  PLT 204 208   BMET Recent Labs    03/18/24 2116 03/19/24 0248  NA 136 138  K 3.9 5.2*  CL 98 98  CO2 26 28  GLUCOSE 109* 104*  BUN 44* 44*  CREATININE 2.87* 2.94*  CALCIUM  8.1* 8.5*    Studies/Results: MR BRAIN WO CONTRAST Result Date: 03/19/2024 CLINICAL DATA:  Altered mental status EXAM: MRI HEAD WITHOUT CONTRAST TECHNIQUE: Multiplanar, multiecho pulse sequences of the brain and surrounding structures were obtained without intravenous contrast. COMPARISON:  08/03/2015 FINDINGS: Brain: No acute infarct, mass effect or extra-axial collection. No acute or chronic hemorrhage. There is multifocal hyperintense T2-weighted signal within the white matter. Parenchymal volume and CSF spaces are normal. The midline structures are normal. Vascular: Normal flow voids. Skull and upper cervical spine: Normal calvarium and skull base. Visualized upper  cervical spine and soft tissues are normal. Sinuses/Orbits:Left maxillary sinus retention cyst. No mastoid effusion. Normal orbits. IMPRESSION: 1. No acute intracranial abnormality. 2. Findings of chronic small vessel ischemia. Electronically Signed   By: Juanetta Nordmann M.D.   On: 03/19/2024 02:33   DG Chest 1 View Result Date: 03/18/2024 CLINICAL DATA:  Fever EXAM: CHEST  1 VIEW COMPARISON:  December 12, 2023 FINDINGS: Bilateral reticular interstitial infiltrates, edema versus pneumonia, correlate clinically. Minimal bilateral basilar atelectatic changes without consolidations No pleural effusion Heart upper limits of normal Stimulator device of the lower neck projections of the right hemithorax IMPRESSION: Bilateral reticular interstitial infiltrates, edema versus pneumonia, correlate clinically. Electronically Signed   By: Fredrich Jefferson M.D.   On: 03/18/2024 15:32    Assessment/Plan: Postop day #7: The patient's lower extremity strength is normal.  Renal sufficiency: He made 800 cc of urine overnight.  His creatinine is stable but elevated.  Elevated troponin: He is not having any chest pain.  Likely secondary to his recent surgery.  I will leave it up to Dr. Mae Schlossman whether he thinks a cardiology consult is indicated.  Mild confusion: His brain scan is unremarkable.  This is likely secondary to his medical issues, chronic pain medication use, etc.,  Hypoxia: His chest x-ray demonstrates some likely pulmonary edema.  A VQ scan has been scheduled to rule out PE because of his elevated creatinine.  I appreciate Dr. Bernardine Bridegroom management of his medical issues.    LOS: 0 days     [name] 03/19/2024,  10:06 AM     Patient ID: Tony Campos, male   DOB: 09-05-1966, 58 y.o.   MRN: [id number]

## 2024-03-19 NOTE — Progress Notes (Signed)
 [facility] night cross cover note:   As requested, followed up the patient's updated creatinine, which is 2.87. per the associated signout, will refrain from CTA chest to eval for PE. Rather, I have empirically started heparin  drip due to concern for PE, and have ordered VQ scan for the morning.    Camelia Cavalier, DO Hospitalist

## 2024-03-19 NOTE — Evaluation (Signed)
 Clinical/Bedside Swallow Evaluation Patient Details  Name: Tony Campos MRN: [id number] Date of Birth: January 10, 1966  Today's Date: 03/19/2024 Time: SLP Start Time (ACUTE ONLY): 1010 SLP Stop Time (ACUTE ONLY): 1027 SLP Time Calculation (min) (ACUTE ONLY): 17 min  Past Medical History:  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   Sleep apnea 12/2015   No cpap, has Inspire   Smoker    Spondylosis    Testosterone  deficiency    Past Surgical History:  Past Surgical History:  Procedure Laterality Date   APPENDECTOMY  12/2018   at [facility]   CHOLECYSTECTOMY  2007   CLOSED REDUCTION MANDIBLE N/A 03/09/2018   Procedure: CLOSED REDUCTION MANDIBULAR MMF;  Surgeon: Ammon Bales, MD;  Location: [facility] OR;  Service: ENT;  Laterality: N/A;   COLONOSCOPY  07/26/2020   10/22/2018   DRUG INDUCED ENDOSCOPY N/A 04/06/2021   Procedure: DRUG INDUCED ENDOSCOPY;  Surgeon: Virgina Grills, MD;  Location: [facility];  Service: ENT;  Laterality: N/A;   IMPLANTATION OF HYPOGLOSSAL NERVE STIMULATOR Right 07/20/2021   Procedure: IMPLANTATION OF HYPOGLOSSAL NERVE STIMULATOR;  Surgeon: Virgina Grills, MD;  Location: [facility];  Service: ENT;  Laterality: Right;   KNEE SURGERY     3 on right knee and 1 on left knee   LAPAROSCOPIC APPENDECTOMY N/A 07/25/2018   Procedure: APPENDECTOMY LAPAROSCOPIC;  Surgeon: Alanda Allegra, MD;  Location: [facility];  Service: General;  Laterality: N/A;   MANDIBULAR HARDWARE REMOVAL N/A  05/06/2018   Procedure: REMOVAL OF MMF HARDWARE;  Surgeon: Ammon Bales, MD;  Location: [facility];  Service: ENT;  Laterality: N/A;   NASAL SEPTUM SURGERY     NASAL SINUS SURGERY     POLYPECTOMY     right sfa recanalization and stenting  Right 01/12/2016   [facility]   UPPER GASTROINTESTINAL ENDOSCOPY  07/26/2020   WRIST SURGERY     right and left   HPI:  Tony Campos is a 58 yo male presenting to ED 4/16 with spondylolisthesis and recurrent herniated disc s/p L4-5 discectomy. Course complicated by hypoxia, weakness, and AKI. CXR shows bilateral reticular interstitial infiltrates. CTA Chest pending for concern of PE. PMH includes abdominal aortic aneurysm without ruptured last imaged 12/2023, morbid obesity, sleep apnea, GERD, anemia, bipolar disorder/depression, essential HTN, hypothyroidism, PAD, SIADH, current tobacco use, hypogonadism, cholecystectomy    Assessment / Plan / Recommendation  Clinical Impression  Pt denies any history of dysphagia and states his GERD is now managed well with medication. No s/s of dysphagia or aspiration were observed with with self-fed trials of thin liquids, purees, or solids. Pt and his wife verbalized interest in completing an MBS given concern for PNA vs PE. Will proceed with an MBS, tentatively scheduled for next date. Recommend he continue his current diet in the interim. SLP Visit Diagnosis: Dysphagia, unspecified (R13.10)    Aspiration Risk  Mild aspiration risk  Diet Recommendation Regular;Thin liquid    Liquid Administration via: Cup;Straw Medication Administration: Whole meds with liquid Supervision: Patient able to self feed Compensations: Minimize environmental distractions;Slow rate;Small sips/bites Postural Changes: Seated upright at 90 degrees;Remain upright for at least 30 minutes after po intake    Other  Recommendations Oral Care Recommendations: Oral care BID    Recommendations for follow up therapy are one  component of a multi-disciplinary discharge planning process, led by the attending physician.  Recommendations may be updated based on patient status, additional functional criteria and insurance authorization.  Follow up Recommendations No SLP follow up      Assistance Recommended at Discharge    Functional Status Assessment Patient has had a recent decline in their functional status and demonstrates the ability to make significant improvements in function in a reasonable and predictable amount of time.  Frequency and Duration min 2x/week  2 weeks       Prognosis Prognosis for improved oropharyngeal function: Good      Swallow Study   General HPI: Tony Campos is a 58 yo male presenting to ED 4/16 with spondylolisthesis and recurrent herniated disc s/p L4-5 discectomy. Course complicated by hypoxia, weakness, and AKI. CXR shows bilateral reticular interstitial infiltrates. CTA Chest pending for concern of PE. PMH includes abdominal aortic aneurysm without ruptured last imaged 12/2023, morbid obesity, sleep apnea, GERD, anemia, bipolar disorder/depression, essential HTN, hypothyroidism, PAD, SIADH, current tobacco use, hypogonadism, cholecystectomy Type of Study: Bedside Swallow Evaluation Previous Swallow Assessment: none in chart Diet Prior to this Study: Regular;Thin liquids (Level 0) Temperature Spikes Noted: No Respiratory Status: Nasal cannula History of Recent Intubation: No Behavior/Cognition: Alert;Cooperative Oral Cavity Assessment: Within Functional Limits Oral Care Completed by SLP: No Oral Cavity - Dentition: Adequate natural dentition Vision: Functional for self-feeding Self-Feeding Abilities: Able to feed self Patient Positioning: Upright in bed Baseline Vocal Quality: Normal Volitional Cough: Strong Volitional Swallow: Able to elicit    Oral/Motor/Sensory Function Overall Oral Motor/Sensory Function: Within functional limits   Ice Chips Ice chips: Not tested    Thin Liquid Thin Liquid: Within functional limits Presentation: Straw;Self Fed    Nectar Thick Nectar Thick Liquid: Not tested   Honey Thick Honey Thick Liquid: Not tested   Puree Puree: Within functional limits Presentation: Spoon;Self Fed   Solid     Solid: Within functional limits Presentation: Self Fed      Amil Kale, M.A., CCC-SLP Speech Language Pathology, Acute Rehabilitation Services  Secure Chat preferred [phone]  03/19/2024,11:05 AM

## 2024-03-19 NOTE — Telephone Encounter (Signed)
 Patient Product/process development scientist completed.    The patient is insured through [employer]. Patient has Medicare and is not eligible for a copay card, but may be able to apply for patient assistance or Medicare RX Payment Plan (Patient Must reach out to their plan, if eligible for payment plan), if available.    Ran test claim for Eliquis 5 mg and the current 30 day co-pay is $12.15.  Ran test claim for Xarelto 20 mg and the current 30 day co-pay is $12.15.  This test claim was processed through [facility]- copay amounts may vary at other pharmacies due to pharmacy/plan contracts, or as the patient moves through the different stages of their insurance plan.     Morgan Arab, CPHT Pharmacy Technician III Certified Patient Advocate [facility] Patient Advocate Team Direct Number: [phone]  Fax: [phone]

## 2024-03-19 NOTE — Progress Notes (Signed)
 PHARMACY - ANTICOAGULATION CONSULT NOTE  Pharmacy Consult for Heparin  Indication: pulmonary embolus  No Known Allergies  Patient Measurements: Height: 6' (182.9 cm) Weight: 120.1 kg (264 lb 12.4 oz) IBW/kg (Calculated) : 77.6 HEPARIN  DW (KG): 105.3  Vital Signs: Temp: 98.3 F (36.8 C) (04/23 0751) Temp Source: Oral (04/23 0751) BP: 115/84 (04/23 0751) Pulse Rate: 89 (04/23 0751)  Labs: Recent Labs    03/18/24 0848 03/18/24 1458 03/18/24 1853 03/18/24 1938 03/18/24 2116 03/18/24 2316 03/19/24 0248 03/19/24 0832  HGB  --  8.4*  --   --   --   --  9.0*  --   HCT  --  25.0*  --   --   --   --  27.7*  --   PLT  --  204  --   --   --   --  208  --   HEPARINUNFRC  --   --   --   --   --   --   --  0.28*  CREATININE 2.56*  --   --   --  2.87*  --  2.94*  --   CKTOTAL  --   --   --  2,590*  --   --   --  3,556*  TROPONINIHS  --   --  2,626*  --  2,719* 2,806*  --   --     Estimated Creatinine Clearance: 38 mL/min (A) (by C-G formula based on SCr of 2.94 mg/dL (H)).   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   Sleep apnea 12/2015   No cpap, has Inspire   Smoker    Spondylosis    Testosterone  deficiency     Medications:  Scheduled:   brexpiprazole   2 mg Oral Daily   Chlorhexidine  Gluconate Cloth  6 each Topical Q0600   clonazePAM   0.5 mg Oral BID   cyanocobalamin   1,000 mcg Intramuscular Daily   Followed by   Cecily Cohen ON 03/22/2024] vitamin B-12  1,000 mcg Oral Daily   DULoxetine   60 mg  Oral Daily   ferrous sulfate   325 mg Oral QHS   lamoTRIgine   200 mg Oral BID   levothyroxine   125 mcg Oral Q0600   nicotine   21 mg Transdermal Daily   oxyCODONE   10 mg Oral Q12H   pantoprazole   40 mg Oral Daily   pregabalin   50 mg Oral BID   rosuvastatin   40 mg Oral QHS   sodium chloride  flush  3 mL Intravenous Q12H   valACYclovir   500 mg Oral Daily    Assessment: 58 y.o. male  s/p L4-5 lumbar fusion 4/16, now with AMS, AKI, possible PE awaiting VQ scan for heparin .  Heparin  level this AM is just slightly below goal at 0.28.  No overt bleeding or complications noted, CBC stable.  No known issues with IV heparin  infusion.   Goal of Therapy:  Heparin  level  0.3-0.7 units/ml Monitor platelets by anticoagulation protocol: Yes   Plan:  Increase IV heparin  to 1900 units/hr Repeat heparin  level in 8 hrs Daily heparin  level and CBC.  Joanell Mowers, Davey Erp, BCCP Clinical Pharmacist  03/19/2024 9:39 AM   [facility] pharmacy phone numbers are listed on [email or web address]

## 2024-03-19 NOTE — Consult Note (Addendum)
 Cardiology Consultation   Patient ID: CORRADO HYMON MRN: [id number]; DOB: 06-19-1966  Admit date: 03/12/2024 Date of Consult: 03/19/2024  PCP:  Austine Lefort, MD   [facility] Providers Cardiologist:  Armida Lander, MD   {    Patient Profile:   Tony Campos is a 58 y.o. male with a hx of AAA without rupture, OSA on inspire, PAD s/p right SFA angioplasty/stent, HTN, hypothyroidism, hypogonadism, anxiety, depression, bipolar disorder, tobacco use disorder, GERD, morbid obesity, chronic pain syndrome, and lumbar spondylolisthesis, who is being seen 03/19/2024 for the evaluation of elevated troponin at the request of Dr Mae Schlossman.  History of Present Illness:   Tony Campos with above PMH presented to the [facility] on 03/12/24 and underwent elective bilateral L4-5 laminotomy/foraminotomies/medial facetectomy; redo L4-5 disectomy; left L4-5 transforaminal lumbar interbody fusion with local morselized autograft bone and Zimmer DBM; insertion of interbody prosthesis at L4-5; posterior nonsegmental instrumentation from L4 to L5 with globus titanium pedicle screws and rods; posterior lateral arthrodesis at L4-5 with local morselized autograft bone and Zimmer DBM, by Dr Larrie Po. Post course was complicated by difficulty with pain control, acute metabolic encephalopathy, acute hypoxic respiratory failure, and AKI. He was transferred to internal medicine service on 03/18/24 for further evaluation.   He was found to have AKI with Cr 2.56, anion gap 17, GFR 29, albumin  2.5, B 12 low 160, anemia with Hgb 8.4 on 03/18/24. UA was not remarkable for infection. Blood culture NTD. Lactic acid was normal. Procal 0.16.CK was elevated 2590.  Hs trop 2626 [phone]. ABG showed mild hypercarbia and hypoxia, PH 7.33.  MRI of brain showed no acute finding. CXR showed bilateral reticular interstitial infiltrates, edema versus pneumonia. It was felt his encephalopathy was due to multiple sedating/pain medications  use including MS Contin , clonazepam , Flexeril , Lyrica  and IV fentanyl . His medication for pain had been readjusted, B12 replaced. It was felt his hypoxia is due to over-sedation + OSA. IV heparin  gtt was started for presumed PE given elevated D dimer and VQ scan is pending today. Etiology for his AKI and elevated CK was felt unclear, renal ultrasound showed no hydronephrosis but 2.1 x 2.5 x 1.5 cm isoechoic structure of left kidney, his home losartan  was held, Foley was placed for urinary retention and he was given IVF.   Labs today showed K 5.2, worsening Cr 2.94, BUN 44, albumin  2.6, AST 56, and GFR 24. BNP 228. CK worsening 3556. Hgb stable at 9. Echo today showed LVEF 60-65%, difficult assess RWMA, mod LVH, normal RV, no significant valvular disease. EKG yesterday showed sinus rhythm 75bpm, subtle ST up-sloping of lateral leads. EKG repeat today showed sinus rhythm with TWI of lateral leads and artifacts. Cardiology is consulted today for further evaluation for elevated troponin. He has never had chest pain.   He saw Dr Amanda Jungling remotely in 2021 for DOE, symptoms was felt due to deconditioning and was improving, Echo from 2014 and EKG were reassuring, no further workup was recommended.   Upon encounter, patient is confused from baseline, limited historian, can't answer question and feels he can't think straight, he denied any active chest pain, states he has pain of his back. His wife is at bedside and assisted history. Wife reports patient suffers chronic pain from back issue, on several chronic pain meds, is not very active at baseline due to pain. He is able to walk in the house or take out trashes, does not exercise or go for a walk normally. He has  never complained about chest pain at home. He has been quite confused since his surgery, not really eating or drinking much due to confusion. He has never had CVA or MI, no known family hx of premature CAD as well. He smokes cigarettes <1 PPD at baseline.      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   Sleep apnea 12/2015   No cpap, has Inspire   Smoker    Spondylosis    Testosterone  deficiency     Past Surgical History:  Procedure Laterality Date   APPENDECTOMY  12/2018   at [facility]   CHOLECYSTECTOMY  2007   CLOSED REDUCTION MANDIBLE N/A 03/09/2018   Procedure: CLOSED REDUCTION MANDIBULAR MMF;  Surgeon: Ammon Bales, MD;  Location: [facility] OR;  Service: ENT;  Laterality: N/A;   COLONOSCOPY  07/26/2020   10/22/2018   DRUG INDUCED ENDOSCOPY N/A 04/06/2021   Procedure: DRUG INDUCED ENDOSCOPY;  Surgeon: Virgina Grills, MD;  Location: [facility];  Service: ENT;  Laterality: N/A;   IMPLANTATION OF HYPOGLOSSAL NERVE STIMULATOR Right 07/20/2021   Procedure: IMPLANTATION OF HYPOGLOSSAL NERVE STIMULATOR;  Surgeon: Virgina Grills, MD;  Location: [facility];  Service: ENT;  Laterality: Right;   KNEE SURGERY     3 on right knee and 1 on left knee   LAPAROSCOPIC APPENDECTOMY N/A 07/25/2018   Procedure: APPENDECTOMY LAPAROSCOPIC;  Surgeon: Alanda Allegra, MD;  Location: [facility];  Service: General;  Laterality: N/A;   MANDIBULAR HARDWARE REMOVAL N/A 05/06/2018   Procedure: REMOVAL OF MMF HARDWARE;  Surgeon: Ammon Bales, MD;  Location:  SURGERY CENTER;  Service: ENT;  Laterality: N/A;   NASAL SEPTUM SURGERY     NASAL SINUS SURGERY     POLYPECTOMY     right sfa recanalization and stenting  Right 01/12/2016   [facility]   UPPER GASTROINTESTINAL  ENDOSCOPY  07/26/2020   WRIST SURGERY     right and left     Home Medications:  Prior to Admission medications   Medication Sig Start Date End Date Taking? Authorizing Provider  Alpha-Lipoic Acid 600 MG CAPS Take 1 capsule by mouth 2 (two) times daily. Takes with Lyrica    Yes [provider]  aspirin EC 81 MG tablet Take 81 mg by mouth daily.   Yes [provider]  baclofen  (LIORESAL ) 10 MG tablet Take 10 mg by mouth 3 (three) times daily.   Yes [provider]  brexpiprazole  (REXULTI ) 2 MG TABS tablet Take 2 mg by mouth daily. 02/06/22  Yes [provider]  clonazePAM  (KLONOPIN ) 1 MG tablet Take 1 mg by mouth 2 (two) times daily.   Yes [provider]  DULoxetine  (CYMBALTA ) 60 MG capsule Take 60 mg by mouth daily. 07/09/20  Yes [provider]  ferrous sulfate  325 (65 FE) MG tablet Take 325 mg by mouth daily.   Yes [provider]  lamoTRIgine  (LAMICTAL ) 200  MG tablet Take 200 mg by mouth 2 (two) times daily. 07/20/20  Yes [provider]  levothyroxine  (SYNTHROID ) 125 MCG tablet take 1 tablet (125 microgram total) by mouth daily before breakfast. 01/10/24  Yes Austine Lefort, MD  losartan  (COZAAR ) 50 MG tablet take 1 tablet (50 milligram total) by mouth daily. 01/10/24  Yes Austine Lefort, MD  metoprolol  tartrate (LOPRESSOR ) 25 MG tablet take (1) tablet twice daily. 01/10/24  Yes Austine Lefort, MD  morphine  (MS CONTIN ) 15 MG 12 hr tablet Take 15 mg by mouth 3 (three) times daily. 11/24/21  Yes [provider]  NARCAN 4 MG/0.1ML LIQD nasal spray kit Place 1 spray into the nose once. 09/08/20  Yes [provider]  oxyCODONE -acetaminophen  (PERCOCET) 5-325 MG tablet Take 1 tablet by mouth every 6 (six) hours as needed for severe pain (pain score 7-10) (Postoeprative pain). 03/13/24 03/13/25 Yes Bergman, Meghan D, NP  pantoprazole  (PROTONIX ) 40 MG tablet TAKE (1) TABLET TWICE DAILY. 09/28/23  Yes Austine Lefort, MD  pregabalin  (LYRICA ) 150 MG capsule Take 150 mg by mouth 2 (two) times daily. 03/02/21  Yes [provider]  Solriamfetol  HCl (SUNOSI ) 150 MG TABS Take 150 mg by mouth daily.   Yes [provider]  valACYclovir  (VALTREX ) 500 MG tablet take 1 tablet (500 milligram total) by mouth daily. 01/17/24  Yes Austine Lefort, MD  vitamin B-12 (CYANOCOBALAMIN ) 500 MCG tablet Take 500 mcg by mouth daily.   Yes [provider]  Vitamin E 180 MG (400 UNIT) CAPS Take 400 Units by mouth daily.   Yes [provider]  cyclobenzaprine  (FLEXERIL ) 10 MG tablet Take 1 tablet (10 mg total) by mouth 3 (three) times daily as needed for muscle spasms. 03/13/24   Bergman, Meghan D, NP  docusate sodium  (COLACE) 100 MG capsule Take 1 capsule (100 mg total) by mouth 2 (two) times daily. 03/13/24   Bergman, Meghan D, NP  rosuvastatin  (CRESTOR ) 40 MG tablet take 1 tablet (40 MILLIGRAM total) by mouth daily. 03/17/24   Austine Lefort, MD  esomeprazole (NEXIUM) 40 MG capsule Take 40 mg by mouth daily before breakfast.  03/19/13  [provider]    Inpatient Medications: Scheduled Meds:  acetaminophen   1,000 mg Oral Q8H   brexpiprazole   2 mg Oral Daily   Chlorhexidine  Gluconate Cloth  6 each Topical Q0600   clonazePAM   0.5 mg Oral BID   cyanocobalamin   1,000 mcg Intramuscular Daily   Followed by   Cecily Cohen ON 03/22/2024] vitamin B-12  1,000 mcg Oral Daily   DULoxetine   60 mg Oral Daily   ferrous sulfate   325 mg Oral QHS   lamoTRIgine   200 mg Oral BID   levothyroxine   125 mcg Oral Q0600   nicotine   21 mg Transdermal Daily   oxyCODONE   10 mg Oral Q12H   pantoprazole   40 mg Oral Daily   polyethylene glycol  17 g Oral BID   pregabalin   50 mg Oral BID   senna-docusate  2 tablet Oral BID   sodium chloride  flush  3 mL Intravenous Q12H   valACYclovir   500 mg Oral Daily   Continuous Infusions:  sodium chloride  75 mL/hr at 03/18/24 1732   heparin  1,900 Units/hr (03/19/24  1341)   PRN Meds: bisacodyl , fentaNYL  (SUBLIMAZE ) injection, menthol -cetylpyridinium **OR** phenol, ondansetron  **OR** ondansetron  (ZOFRAN ) IV, polyethylene glycol **FOLLOWED BY** [START ON 03/20/2024] polyethylene glycol, sodium chloride  flush  Allergies:   No Known Allergies  Social History:  Social History   Socioeconomic History   Marital status: Married    Spouse name: Not on file   Number of children: 0   Years of education: Not on file   Highest education level: Associate degree: occupational, Scientist, product/process development, or vocational program  Occupational History   Not on file  Tobacco Use   Smoking status: Every Day    Current packs/day: 0.25    Average packs/day: 0.3 packs/day for 0.1 years    Types: Cigarettes    Start date: 02/2024    Passive exposure: Current   Smokeless tobacco: Never   Tobacco comments:    Less than 1/2 pack per day 01/22/23 PAP  Vaping Use   Vaping status: Former  Substance and Sexual Activity   Alcohol use: No   Drug use: No   Sexual activity: Yes  Other Topics Concern   Not on file  Social History Narrative   Not on file   Social Drivers of Health   Financial Resource Strain: Medium Risk (12/11/2023)   Overall Financial Resource Strain (CARDIA)    Difficulty of Paying Living Expenses: Somewhat hard  Food Insecurity: No Food Insecurity (03/12/2024)   Hunger Vital Sign    Worried About Running Out of Food in the Last Year: Never true    Ran Out of Food in the Last Year: Never true  Transportation Needs: No Transportation Needs (03/12/2024)   PRAPARE - Administrator, Civil Service (Medical): No    Lack of Transportation (Non-Medical): No  Physical Activity: Insufficiently Active (12/11/2023)   Exercise Vital Sign    Days of Exercise per Week: 1 day    Minutes of Exercise per Session: 10 min  Stress: Stress Concern Present (12/11/2023)   [employer] of Occupational Health - Occupational Stress Questionnaire    Feeling of Stress : To  some extent  Social Connections: Unknown (12/11/2023)   Social Connection and Isolation Panel [NHANES]    Frequency of Communication with Friends and Family: Once a week    Frequency of Social Gatherings with Friends and Family: Patient declined    Attends Religious Services: 1 to 4 times per year    Active Member of [employer] or Organizations: No    Attends Engineer, structural: Not on file    Marital Status: Married  Catering manager Violence: Not At Risk (03/12/2024)   Humiliation, Afraid, Rape, and Kick questionnaire    Fear of Current or Ex-Partner: No    Emotionally Abused: No    Physically Abused: No    Sexually Abused: No    Family History:    Family History  Problem Relation Age of Onset   Other Father        killed in [place]   Cancer Maternal Grandfather        stomach   Stomach cancer Maternal Grandfather    Colon cancer Neg Hx    Colitis Neg Hx    Esophageal cancer Neg Hx    Rectal cancer Neg Hx      ROS:  Unable ROS due to confusion    Physical Exam/Data:   Vitals:   03/19/24 0038 03/19/24 0426 03/19/24 0751 03/19/24 1140  BP: 99/60  115/84 133/74  Pulse: 86  89 95  Resp:   16 16  Temp:   98.3 F (36.8 C) 98.4 F (36.9 C)  TempSrc:   Oral Oral  SpO2: 92%  93% 96%  Weight:  120.1 kg    Height:  Intake/Output Summary (Last 24 hours) at 03/19/2024 1405 Last data filed at 03/19/2024 1056 Gross per 24 hour  Intake 1039.83 ml  Output 1650 ml  Net -610.17 ml      03/19/2024    4:26 AM 03/18/2024   10:34 PM 03/12/2024    6:46 AM  Last 3 Weights  Weight (lbs) 264 lb 12.4 oz 271 lb 9.7 oz 275 lb  Weight (kg) 120.1 kg 123.2 kg 124.739 kg     Body mass index is 35.91 kg/m.   Vitals:  Vitals:   03/19/24 0751 03/19/24 1140  BP: 115/84 133/74  Pulse: 89 95  Resp: 16 16  Temp: 98.3 F (36.8 C) 98.4 F (36.9 C)  SpO2: 93% 96%   General Appearance: In no apparent distress, laying in bed, obese  HEENT: Normocephalic, atraumatic.  Neck:  Supple, trachea midline, no JVDs Cardiovascular: Regular rate and rhythm, normal S1-S2,  no murmur  Respiratory: Resting breathing unlabored, lungs sounds clear to auscultation bilaterally, no use of accessory muscles. On 3LNC Gastrointestinal: Bowel sounds positive, abdomen soft, non-tender, non-distended.  Extremities: Able to move all extremities in bed without difficulty, no edema of BLE Musculoskeletal: Normal muscle bulk and tone Skin: Intact, warm, dry. No rashes   Neurologic: Alert, oriented to self and place, +cognitive deficit, difficulty with answering questions. Psychiatric: calm     EKG:  The EKG was personally reviewed and demonstrates:    EKG from 03/18/24 2046 showed sinus rhythm 75bpm, subtle up sloping of lateral leads   EKG from 03/19/24 at 2:33am showed sinus rhythm 90bpm, TWI of lateral leads  Telemetry:  Telemetry was personally reviewed and demonstrates:    Sinus rhythm 90s   Relevant CV Studies:   Echo from today showed :  1. Left ventricular ejection fraction, by estimation, is 60 to 65%. The  left ventricle has normal function. Left ventricular endocardial border  not optimally defined to evaluate regional wall motion. There is moderate  concentric left ventricular  hypertrophy. Left ventricular diastolic parameters were normal.   2. Right ventricular systolic function is normal. The right ventricular  size is normal. Tricuspid regurgitation signal is inadequate for assessing  PA pressure.   3. The mitral valve is normal in structure. No evidence of mitral valve  regurgitation. No evidence of mitral stenosis.   4. The aortic valve was not well visualized. Aortic valve regurgitation  is not visualized. Aortic valve sclerosis/calcification is present,  without any evidence of aortic stenosis.   5. The inferior vena cava is normal in size with greater than 50%  respiratory variability, suggesting right atrial pressure of 3 mmHg.    Laboratory  Data:  High Sensitivity Troponin:   Recent Labs  Lab 03/18/24 1853 03/18/24 2116 03/18/24 2316  TROPONINIHS 2,626* 2,719* 2,806*     Chemistry Recent Labs  Lab 03/18/24 0848 03/18/24 2116 03/19/24 0248  NA 138 136 138  K 3.7 3.9 5.2*  CL 96* 98 98  CO2 25 26 28   GLUCOSE 98 109* 104*  BUN 38* 44* 44*  CREATININE 2.56* 2.87* 2.94*  CALCIUM  9.0 8.1* 8.5*  GFRNONAA 29* 25* 24*  ANIONGAP 17* 12 12    Recent Labs  Lab 03/18/24 1458 03/19/24 0248  PROT 5.7* 5.7*  ALBUMIN  2.5* 2.6*  AST 53* 56*  ALT 12 13  ALKPHOS 54 57  BILITOT 1.1 1.0   Lipids No results for input(s): "CHOL", "TRIG", "HDL", "LABVLDL", "LDLCALC", "CHOLHDL" in the last 168 hours.  Hematology Recent Labs  Lab 03/18/24 1458 03/19/24 0248  WBC 8.4 7.8  RBC 2.79* 3.06*  HGB 8.4* 9.0*  HCT 25.0* 27.7*  MCV 89.6 90.5  MCH 30.1 29.4  MCHC 33.6 32.5  RDW 16.3* 16.4*  PLT 204 208   Thyroid   Recent Labs  Lab 03/19/24 0831  TSH 0.310*    BNP Recent Labs  Lab 03/19/24 0735  BNP 228.7*    DDimer  Recent Labs  Lab 03/18/24 1856  DDIMER 2.45*     Radiology/Studies:  ECHOCARDIOGRAM COMPLETE Result Date: 03/19/2024    ECHOCARDIOGRAM REPORT   Patient Name:   Tony Campos Glauber Date of Exam: 03/19/2024 Medical Rec #:  [id number]       Height:       72.0 in Accession #:    [id number]      Weight:       264.8 lb Date of Birth:  1966-08-01       BSA:          2.400 m Patient Age:    57 years        BP:           115/84 mmHg Patient Gender: M               HR:           97 bpm. Exam Location:  Inpatient Procedure: 2D Echo, Color Doppler and Cardiac Doppler (Both Spectral and Color            Flow Doppler were utilized during procedure). Indications:    Pulmonary Embolus  History:        Patient has no prior history of Echocardiogram examinations.                 AAA; Risk Factors:Dyslipidemia and Hypertension.  Sonographer:    Hersey Lorenzo RDCS Referring Phys: [phone] EKTA V PATEL IMPRESSIONS  1. Left  ventricular ejection fraction, by estimation, is 60 to 65%. The left ventricle has normal function. Left ventricular endocardial border not optimally defined to evaluate regional wall motion. There is moderate concentric left ventricular hypertrophy. Left ventricular diastolic parameters were normal.  2. Right ventricular systolic function is normal. The right ventricular size is normal. Tricuspid regurgitation signal is inadequate for assessing PA pressure.  3. The mitral valve is normal in structure. No evidence of mitral valve regurgitation. No evidence of mitral stenosis.  4. The aortic valve was not well visualized. Aortic valve regurgitation is not visualized. Aortic valve sclerosis/calcification is present, without any evidence of aortic stenosis.  5. The inferior vena cava is normal in size with greater than 50% respiratory variability, suggesting right atrial pressure of 3 mmHg. FINDINGS  Left Ventricle: Left ventricular ejection fraction, by estimation, is 60 to 65%. The left ventricle has normal function. Left ventricular endocardial border not optimally defined to evaluate regional wall motion. The left ventricular internal cavity size was normal in size. There is moderate concentric left ventricular hypertrophy. Left ventricular diastolic parameters were normal. Normal left ventricular filling pressure. Right Ventricle: The right ventricular size is normal. No increase in right ventricular wall thickness. Right ventricular systolic function is normal. Tricuspid regurgitation signal is inadequate for assessing PA pressure. Left Atrium: Left atrial size was normal in size. Right Atrium: Right atrial size was normal in size. Pericardium: There is no evidence of pericardial effusion. Mitral Valve: The mitral valve is normal in structure. No evidence of mitral valve regurgitation. No evidence of mitral valve stenosis. Tricuspid Valve: The tricuspid valve  is normal in structure. Tricuspid valve regurgitation  is not demonstrated. No evidence of tricuspid stenosis. Aortic Valve: The aortic valve was not well visualized. Aortic valve regurgitation is not visualized. Aortic valve sclerosis/calcification is present, without any evidence of aortic stenosis. Pulmonic Valve: The pulmonic valve was normal in structure. Pulmonic valve regurgitation is trivial. No evidence of pulmonic stenosis. Aorta: The aortic root is normal in size and structure. Venous: The inferior vena cava is normal in size with greater than 50% respiratory variability, suggesting right atrial pressure of 3 mmHg. IAS/Shunts: No atrial level shunt detected by color flow Doppler.  LEFT VENTRICLE PLAX 2D LVIDd:         5.30 cm   Diastology LVIDs:         3.60 cm   LV e' medial:    8.16 cm/s LV PW:         1.40 cm   LV E/e' medial:  10.9 LV IVS:        1.40 cm   LV e' lateral:   10.30 cm/s LVOT diam:     2.50 cm   LV E/e' lateral: 8.6 LV SV:         104 LV SV Index:   43 LVOT Area:     4.91 cm  RIGHT VENTRICLE         IVC TAPSE (M-mode): 2.3 cm  IVC diam: 2.00 cm LEFT ATRIUM             Index        RIGHT ATRIUM           Index LA diam:        3.70 cm 1.54 cm/m   RA Area:     19.00 cm LA Vol (A2C):   51.2 ml 21.33 ml/m  RA Volume:   52.30 ml  21.79 ml/m LA Vol (A4C):   47.8 ml 19.91 ml/m LA Biplane Vol: 50.2 ml 20.91 ml/m  AORTIC VALVE LVOT Vmax:   133.00 cm/s LVOT Vmean:  76.700 cm/s LVOT VTI:    0.212 m  AORTA Ao Root diam: 3.20 cm Ao Asc diam:  3.30 cm MITRAL VALVE MV Area (PHT): 4.60 cm    SHUNTS MV Decel Time: 165 msec    Systemic VTI:  0.21 m MV E velocity: 88.80 cm/s  Systemic Diam: 2.50 cm MV A velocity: 89.90 cm/s MV E/A ratio:  0.99 Gaylyn Keas MD Electronically signed by Gaylyn Keas MD Signature Date/Time: 03/19/2024/11:09:26 AM    Final    US  RENAL Result Date: 03/19/2024 CLINICAL DATA:  [id number] AKI (acute kidney injury) (HCC) [id number] EXAM: RENAL / URINARY TRACT ULTRASOUND COMPLETE COMPARISON:  January 05, 2020, February 13, 2011 FINDINGS:  Right Kidney: Renal measurements: 14.1 x 6.5 x 6.3 cm = volume: 302 mL. Normal echogenicity. Interpolar region cyst measuring 4.2 x 4.1 x 3.8 cm. No hydronephrosis or nephrolithiasis. Left Kidney: Renal measurements: 11.5 x 6.8 x 3.9 cm = volume: 160 mL. 1.6 x 1.8 x 1.8 cm cyst in the lower pole. Isoechoic structure measured by the sonographer in the left kidney measuring 2.1 x 2.5 x 1.5 cm. No mass. No hydronephrosis or nephrolithiasis. Bladder: Completely decompressed with a urinary catheter in place. Other: None. IMPRESSION: 1.  No hydronephrosis or nephrolithiasis. 2. Isoechoic structure in the left kidney (2.1 x 2.5 x 1.5 cm), measured by the sonographer, may represent a region of protuberant renal cortex or underlying renal lesion. A nonemergent CT or MRI of the abdomen with IV  contrast should be considered for further characterization. Electronically Signed   By: Rance Burrows M.D.   On: 03/19/2024 10:48   MR BRAIN WO CONTRAST Result Date: 03/19/2024 CLINICAL DATA:  Altered mental status EXAM: MRI HEAD WITHOUT CONTRAST TECHNIQUE: Multiplanar, multiecho pulse sequences of the brain and surrounding structures were obtained without intravenous contrast. COMPARISON:  08/03/2015 FINDINGS: Brain: No acute infarct, mass effect or extra-axial collection. No acute or chronic hemorrhage. There is multifocal hyperintense T2-weighted signal within the white matter. Parenchymal volume and CSF spaces are normal. The midline structures are normal. Vascular: Normal flow voids. Skull and upper cervical spine: Normal calvarium and skull base. Visualized upper cervical spine and soft tissues are normal. Sinuses/Orbits:Left maxillary sinus retention cyst. No mastoid effusion. Normal orbits. IMPRESSION: 1. No acute intracranial abnormality. 2. Findings of chronic small vessel ischemia. Electronically Signed   By: Juanetta Nordmann M.D.   On: 03/19/2024 02:33   DG Chest 1 View Result Date: 03/18/2024 CLINICAL DATA:  Fever  EXAM: CHEST  1 VIEW COMPARISON:  December 12, 2023 FINDINGS: Bilateral reticular interstitial infiltrates, edema versus pneumonia, correlate clinically. Minimal bilateral basilar atelectatic changes without consolidations No pleural effusion Heart upper limits of normal Stimulator device of the lower neck projections of the right hemithorax IMPRESSION: Bilateral reticular interstitial infiltrates, edema versus pneumonia, correlate clinically. Electronically Signed   By: Fredrich Jefferson M.D.   On: 03/18/2024 15:32     Assessment and Plan:   NSTEMI - patient never had chest pain historically or now  - presented with L4-5 decompression surgery, post-op course complicated by encephalopathy, hypoxia, polypharmacy, AKI, rhabdomyolysis , anemia  - he has PAD, HTN, obesity, OSA, active tobacco use which increases his risk of CAD - Hs trop [phone] - EKG with lateral T wave abnormalities which is non-specific  - Echo with LVEF 60-65%, unable assess RWMA - HgbA1C 5%, will check lipid panel - suspect this is demand ischemia in the setting of AKI /rhabdo, clinically lacking angina symptoms, Echo overall reassuring, also not ideal time for any invasive cardiac workup/intervention using contrast due to AKI as well as post-op lumbar surgery  - would start medical therapy with ASA 81mg  daily (if cleared by surgery) and continue PTA metoprolol  25mg  BID; statin held due to rhabdo, resume when able  - will re-evaluate outpatient when acute issues resolve   L4-5 spondylolisthesis with lumbar radiculopathy s/p back surgery 4/16 Acute metabolic encephalopathy  Acute respiratory failure with hypoxia and hypercapnia  Presumed PE AKI Rhabdomyolysis AAA HTN PAD OSA Hypothyroidism  - per primary team     Risk Assessment/Risk Scores:   TIMI Risk Score for Unstable Angina or Non-ST Elevation MI:   The patient's TIMI risk score is 2, which indicates a 8% risk of all cause mortality, new or recurrent  myocardial infarction or need for urgent revascularization in the next 14 days.{      For questions or updates, please contact [facility] Please consult [email or web address] for contact info under    Signed, Armanie Ullmer, NP  03/19/2024 2:05 PM

## 2024-03-19 NOTE — Progress Notes (Signed)
 PT Cancellation Note  Patient Details Name: Tony Campos MRN: [id number] DOB: 09/03/1966   Cancelled Treatment:    Reason Eval/Treat Not Completed: Medical issues which prohibited therapy [name], DO empirically started heparin  drip due to concern for PE and has ordered VQ scan for this morning. Will hold PT treatment until results are reviewed. Plan to follow-up as schedule permits.)  [name], PT, DPT Acute Rehabilitation Services Office: [phone] Secure Chat Preferred  [name] 03/19/2024, 7:44 AM

## 2024-03-19 NOTE — Progress Notes (Addendum)
 PROGRESS NOTE  Tony Campos GNF:[id number] DOB: 09-25-1966   PCP: Austine Lefort, MD  Patient is from: Home.  DOA: 03/12/2024 LOS: 0  Chief complaints No chief complaint on file.    Brief Narrative / Interim history: 58 year old M with PMH of AAA without rupture, OSA on inspire, PAD s/p right SFA angioplasty/stent in 12/2025, HTN, hypothyroidism, hypogonadism, anxiety, depression, bipolar disorder, tobacco use disorder, GERD, morbid obesity and lumbar spondylolithiasis.  Patient was admitted by neurosurgery for lumbar spondylolisthesis and underwent surgery.  Postoperative course complicated by acute respiratory failure, AKI and encephalopathy for which she was transferred to hospital service on 03/18/2024.  On chart review, patient hypoxic requiring up to 6 L.  ABG with mild chronic respiratory acidosis.  Patient was on multiple sedating medication for pain and anxiety.  Cr up to 2.56 (baseline 0.8-1.0).  Troponin elevated to 2600 x 3.  CK elevated to 2600.  Patient without chest pain or ischemic finding on EKG.  CXR with bilateral reticular interstitial infiltrates, edema versus pneumonia.  B12 low at 160.  Echocardiogram and VQ scan ordered.  Started on IV heparin  pending VQ scan.   Subjective: Seen and examined earlier this morning.  No major events overnight of this morning.  He was started on IV heparin  pending VQ scan.  He does sleepy but awakes to voice.  He is oriented x 4 except date.  Tends to fall back asleep quickly.  Reports lower back pain.  Denies radiation to his leg.  He has Foley catheter due to urinary retention.  Patient's wife at bedside.  She is not sure if he has been activating his aspire.  Also reports constipation.  Has not had bowel movement since admission.  Denies chest pain, shortness of breath, nausea or vomiting.  Objective: Vitals:   03/19/24 0024 03/19/24 0038 03/19/24 0426 03/19/24 0751  BP: (!) 83/55 99/60  115/84  Pulse: 84 86  89  Resp: 18   16   Temp: 97.7 F (36.5 C)   98.3 F (36.8 C)  TempSrc: Oral   Oral  SpO2: 94% 92%  93%  Weight:   120.1 kg   Height:        Examination:  GENERAL: No apparent distress.  Nontoxic. HEENT: MMM.  Vision and hearing grossly intact.  NECK: Supple.  No apparent JVD.  RESP:  No IWOB.  Fair aeration bilaterally.  Excellent effort on incentive spirometry. CVS:  RRR. Heart sounds normal.  ABD/GI/GU: BS+. Abd soft, NTND.  MSK/EXT:  Moves extremities. No apparent deformity. No edema.  SKIN: no apparent skin lesion or wound NEURO: Sleepy but wakes to voice.  Oriented x 4.  No apparent focal neuro deficit. PSYCH: Calm. Normal affect.   Consultants:  Neurosurgery admitted patient  Procedures: 4/16-Bilateral L4-5 laminotomy/foraminotomies/medial facetectomy to decompress the bilateral L4 and L5 nerve roots by Dr. Larrie Po  Microbiology summarized: 4/22-blood cultures NGTD 4/22-urine culture pending  Assessment and plan: L4-5 spondylolisthesis with lumbar radiculopathy, neurogenic claudication -S/p Bilateral L4-5 laminotomy/foraminotomies/medial facetectomy to decompress on 4/16 -Continue PT/OT.  OOB. -Adjusted pain medication due to encephalopathy and respiratory failure -Encourage smoking cessation -On heparin  for VTE prophylaxis  Acute metabolic encephalopathy: This is likely iatrogenic from multiple sedating medications.  Patient was on MS Contin , clonazepam , Flexeril , Lyrica  and IV fentanyl . MRI brain without acute finding.  B12 low at 160.  VBG with mild compensated respiratory acidosis.  Sleepy but wakes to voice.  Oriented x 4 but falls back asleep quickly.  No focal  neurodeficit on exam. - Change MS Contin  to OxyContin  given renal failure.  Decrease dose. - Discontinue Flexeril  - Decrease Lyrica   and Klonopin . - Reorientation and delirium precaution. - Reorientation delirium precaution - Asked wife to activate his Aspire when he falls asleep.  - Vitamin B12 injection followed by  p.o.   Acute respiratory failure with hypoxia and hypercapnia: This is likely from multiple sedating medication underlying OSA.  Patient was not activating his aspire.  VBG with mild compensated respiratory acidosis.  No significant event on CXR.  Excellent effort on incentive parameter.  I doubt PE or pneumonia.  Appears euvolemic on exam.  BNP 228.  TTE without significant finding - Continue IV heparin  pending VQ scan but low suspicion for PE. - Encourage incentive telemetry, OOB, PT/OT - Wean off oxygen  as able.  Encouraged to use his Aspire.  If not, we may try CPAP  Acute kidney injury: Unclear etiology.  No contrast administration.  Losartan  discontinued on 4/20.  Has urinary retention with removal of 800 cc urine on 4/22.  Renal US  with no hydronephrosis of nephrolithiasis but 2.1 x 2.5 x 1.5 cm isoechoic structure of left kidney Recent Labs    06/18/23 1628 09/28/23 1554 03/06/24 1330 03/18/24 0848 03/18/24 2116 03/19/24 0248  BUN 11 6* 8 38* 44* 44*  CREATININE 0.85 0.83 1.07 2.56* 2.87* 2.94*  - Continue Foley catheter - Continue IV fluids - Avoid nephrotoxic meds - Strict intake and output  Urinary retention: Patient had 800 cc urine removed with Foley catheter on 4/22.  This could be due to constipation.  He also had back surgery on 4/16.   Renal ultrasound as above. - Continue Foley catheter - Voiding trial prior to discharge - May consider imaging of his back but defer this to neurosurgery - Treat constipation.  Elevated troponin: High-sensitivity troponin 2626, 2700, 2800.  Could be patient of demand ischemia due to respiratory failure and delayed clearance due to renal failure.  Patient without chest pain or acute ischemic finding on EKG.  TTE reassuring but not able to assess RWMA.  - Consulted cardiology - Already on IV heparin  pending VQ scan  Elevated CK: CK trended from 2600-3500 - Stop statin - Continue IV fluid - Continue monitoring  Vitamin B12  deficiency: B12 160 - Vitamin B12 injection 1000 mcg daily for 3 days followed by p.o.  Left renal lesion: Renal ultrasound showed 2.1 x 2.5 x 1.5 cm isoechoic structure of left kidney.  Nonemergent CT or MRI of abdomen with IV contrast recommended for further catheterization. -Will wait until AKI resolves  AAA/history of PAD - Outpatient follow-up  Anxiety, depression and bipolar disorder - Decrease Klonopin  due to encephalopathy - Continue Cymbalta , Lamictal , Rexulti   Tobacco use disorder - Encouraged smoking cessation  OSA - Advised wife to activate aspire, we may have to try CPAP  Constipation: Wife reported last bowel movement was before admission. - Scheduled MiraLAX  and Senokot  Hypothyroidism - Continue home Synthroid   Class II obesity Body mass index is 35.91 kg/m.           DVT prophylaxis:  SCD's Start: 03/12/24 1451 On full dose anticoagulation Code Status: Full code Family Communication: Updated patient's wife at bedside Level of care: Progressive Status is: Inpatient Remains inpatient appropriate because: Acute metabolic encephalopathy, respiratory failure, AKI   Final disposition: To be determined   55 minutes with more than 50% spent in reviewing records, counseling patient/family and coordinating care.   Sch Meds:  Scheduled Meds:  brexpiprazole   2 mg Oral Daily   Chlorhexidine  Gluconate Cloth  6 each Topical Q0600   clonazePAM   0.5 mg Oral BID   cyanocobalamin   1,000 mcg Intramuscular Daily   Followed by   Cecily Cohen ON 03/22/2024] vitamin B-12  1,000 mcg Oral Daily   DULoxetine   60 mg Oral Daily   ferrous sulfate   325 mg Oral QHS   lamoTRIgine   200 mg Oral BID   levothyroxine   125 mcg Oral Q0600   nicotine   21 mg Transdermal Daily   oxyCODONE   10 mg Oral Q12H   pantoprazole   40 mg Oral Daily   pregabalin   50 mg Oral BID   sodium chloride  flush  3 mL Intravenous Q12H   valACYclovir   500 mg Oral Daily   Continuous Infusions:  sodium  chloride 75 mL/hr at 03/18/24 1732   heparin  1,900 Units/hr (03/19/24 1003)   PRN Meds:.bisacodyl , fentaNYL  (SUBLIMAZE ) injection, menthol -cetylpyridinium **OR** phenol, ondansetron  **OR** ondansetron  (ZOFRAN ) IV, sodium chloride  flush  Antimicrobials: Anti-infectives (From admission, onward)    Start     Dose/Rate Route Frequency Ordered Stop   03/13/24 1000  valACYclovir  (VALTREX ) tablet 500 mg        500 mg Oral Daily 03/12/24 1450     03/12/24 1700  ceFAZolin  (ANCEF ) IVPB 3g/150 mL premix        3 g 300 mL/hr over 30 Minutes Intravenous Every 8 hours 03/12/24 1450 03/12/24 2333   03/12/24 0800  ceFAZolin  (ANCEF ) IVPB 3g/150 mL premix        3 g 300 mL/hr over 30 Minutes Intravenous On call to O.R. 03/12/24 1610 03/12/24 0900        I have personally reviewed the following labs and images: CBC: Recent Labs  Lab 03/18/24 1458 03/19/24 0248  WBC 8.4 7.8  NEUTROABS 6.3 5.2  HGB 8.4* 9.0*  HCT 25.0* 27.7*  MCV 89.6 90.5  PLT 204 208   BMP &GFR Recent Labs  Lab 03/18/24 0848 03/18/24 2116 03/19/24 0248  NA 138 136 138  K 3.7 3.9 5.2*  CL 96* 98 98  CO2 25 26 28   GLUCOSE 98 109* 104*  BUN 38* 44* 44*  CREATININE 2.56* 2.87* 2.94*  CALCIUM  9.0 8.1* 8.5*  PHOS  --   --  5.6*   Estimated Creatinine Clearance: 38 mL/min (A) (by C-G formula based on SCr of 2.94 mg/dL (H)). Liver & Pancreas: Recent Labs  Lab 03/18/24 1458 03/19/24 0248  AST 53* 56*  ALT 12 13  ALKPHOS 54 57  BILITOT 1.1 1.0  PROT 5.7* 5.7*  ALBUMIN  2.5* 2.6*   No results for input(s): "LIPASE", "AMYLASE" in the last 168 hours. Recent Labs  Lab 03/18/24 1856  AMMONIA 37*   Diabetic: Recent Labs    03/18/24 1856  HGBA1C 5.0   Recent Labs  Lab 03/14/24 2256  GLUCAP 110*   Cardiac Enzymes: Recent Labs  Lab 03/18/24 1938 03/19/24 0832  CKTOTAL 2,590* 3,556*   No results for input(s): "PROBNP" in the last 8760 hours. Coagulation Profile: No results for input(s): "INR",  "PROTIME" in the last 168 hours. Thyroid  Function Tests: No results for input(s): "TSH", "T4TOTAL", "FREET4", "T3FREE", "THYROIDAB" in the last 72 hours. Lipid Profile: No results for input(s): "CHOL", "HDL", "LDLCALC", "TRIG", "CHOLHDL", "LDLDIRECT" in the last 72 hours. Anemia Panel: Recent Labs    03/18/24 1458  VITAMINB12 160*  FOLATE 7.4   Urine analysis:    Component Value Date/Time   COLORURINE YELLOW 03/18/2024 1342  APPEARANCEUR HAZY (A) 03/18/2024 1342   LABSPEC 1.011 03/18/2024 1342   PHURINE 5.0 03/18/2024 1342   GLUCOSEU NEGATIVE 03/18/2024 1342   HGBUR SMALL (A) 03/18/2024 1342   BILIRUBINUR NEGATIVE 03/18/2024 1342   KETONESUR NEGATIVE 03/18/2024 1342   PROTEINUR 100 (A) 03/18/2024 1342   UROBILINOGEN 0.2 11/10/2013 1522   NITRITE NEGATIVE 03/18/2024 1342   LEUKOCYTESUR NEGATIVE 03/18/2024 1342   Sepsis Labs: Invalid input(s): "PROCALCITONIN", "LACTICIDVEN"  Microbiology: Recent Results (from the past 240 hours)  Culture, blood (Routine X 2) w Reflex to ID Panel     Status: None (Preliminary result)   Collection Time: 03/18/24  2:58 PM   Specimen: BLOOD RIGHT ARM  Result Value Ref Range Status   Specimen Description BLOOD RIGHT ARM  Final   Special Requests   Final    BOTTLES DRAWN AEROBIC AND ANAEROBIC Blood Culture results may not be optimal due to an inadequate volume of blood received in culture bottles   Culture   Final    NO GROWTH < 24 HOURS Performed at [facility], [address], [place], [place] [address]    Report Status PENDING  Incomplete  Culture, blood (Routine X 2) w Reflex to ID Panel     Status: None (Preliminary result)   Collection Time: 03/18/24  2:59 PM   Specimen: BLOOD LEFT ARM  Result Value Ref Range Status   Specimen Description BLOOD LEFT ARM  Final   Special Requests   Final    BOTTLES DRAWN AEROBIC AND ANAEROBIC Blood Culture results may not be optimal due to an inadequate volume of blood received in culture  bottles   Culture   Final    NO GROWTH < 24 HOURS Performed at [facility], [address], [place], [place] [address]    Report Status PENDING  Incomplete    Radiology Studies: ECHOCARDIOGRAM COMPLETE Result Date: 03/19/2024    ECHOCARDIOGRAM REPORT   Patient Name:   Tony Campos Date of Exam: 03/19/2024 Medical Rec #:  [id number]       Height:       72.0 in Accession #:    [id number]      Weight:       264.8 lb Date of Birth:  Jun 27, 1966       BSA:          2.400 m Patient Age:    57 years        BP:           115/84 mmHg Patient Gender: M               HR:           97 bpm. Exam Location:  Inpatient Procedure: 2D Echo, Color Doppler and Cardiac Doppler (Both Spectral and Color            Flow Doppler were utilized during procedure). Indications:    Pulmonary Embolus  History:        Patient has no prior history of Echocardiogram examinations.                 AAA; Risk Factors:Dyslipidemia and Hypertension.  Sonographer:    Hersey Lorenzo RDCS Referring Phys: [phone] EKTA V PATEL IMPRESSIONS  1. Left ventricular ejection fraction, by estimation, is 60 to 65%. The left ventricle has normal function. Left ventricular endocardial border not optimally defined to evaluate regional wall motion. There is moderate concentric left ventricular hypertrophy. Left ventricular diastolic parameters were normal.  2. Right ventricular systolic function is normal. The right ventricular size is normal. Tricuspid regurgitation signal is inadequate for assessing PA pressure.  3. The mitral valve is normal in structure. No evidence of mitral valve regurgitation. No evidence of mitral stenosis.  4. The aortic valve was not well visualized. Aortic valve regurgitation is not visualized. Aortic valve sclerosis/calcification is present, without any evidence of aortic stenosis.  5. The inferior vena cava is normal in size with greater than 50% respiratory variability, suggesting right atrial pressure of 3 mmHg. FINDINGS  Left  Ventricle: Left ventricular ejection fraction, by estimation, is 60 to 65%. The left ventricle has normal function. Left ventricular endocardial border not optimally defined to evaluate regional wall motion. The left ventricular internal cavity size was normal in size. There is moderate concentric left ventricular hypertrophy. Left ventricular diastolic parameters were normal. Normal left ventricular filling pressure. Right Ventricle: The right ventricular size is normal. No increase in right ventricular wall thickness. Right ventricular systolic function is normal. Tricuspid regurgitation signal is inadequate for assessing PA pressure. Left Atrium: Left atrial size was normal in size. Right Atrium: Right atrial size was normal in size. Pericardium: There is no evidence of pericardial effusion. Mitral Valve: The mitral valve is normal in structure. No evidence of mitral valve regurgitation. No evidence of mitral valve stenosis. Tricuspid Valve: The tricuspid valve is normal in structure. Tricuspid valve regurgitation is not demonstrated. No evidence of tricuspid stenosis. Aortic Valve: The aortic valve was not well visualized. Aortic valve regurgitation is not visualized. Aortic valve sclerosis/calcification is present, without any evidence of aortic stenosis. Pulmonic Valve: The pulmonic valve was normal in structure. Pulmonic valve regurgitation is trivial. No evidence of pulmonic stenosis. Aorta: The aortic root is normal in size and structure. Venous: The inferior vena cava is normal in size with greater than 50% respiratory variability, suggesting right atrial pressure of 3 mmHg. IAS/Shunts: No atrial level shunt detected by color flow Doppler.  LEFT VENTRICLE PLAX 2D LVIDd:         5.30 cm   Diastology LVIDs:         3.60 cm   LV e' medial:    8.16 cm/s LV PW:         1.40 cm   LV E/e' medial:  10.9 LV IVS:        1.40 cm   LV e' lateral:   10.30 cm/s LVOT diam:     2.50 cm   LV E/e' lateral: 8.6 LV SV:          104 LV SV Index:   43 LVOT Area:     4.91 cm  RIGHT VENTRICLE         IVC TAPSE (M-mode): 2.3 cm  IVC diam: 2.00 cm LEFT ATRIUM             Index        RIGHT ATRIUM           Index LA diam:        3.70 cm 1.54 cm/m   RA Area:     19.00 cm LA Vol (A2C):   51.2 ml 21.33 ml/m  RA Volume:   52.30 ml  21.79 ml/m LA Vol (A4C):   47.8 ml 19.91 ml/m LA Biplane Vol: 50.2 ml 20.91 ml/m  AORTIC VALVE LVOT Vmax:   133.00 cm/s LVOT Vmean:  76.700 cm/s LVOT VTI:    0.212 m  AORTA Ao Root diam: 3.20 cm Ao Asc diam:  3.30 cm MITRAL VALVE MV Area (PHT): 4.60 cm    SHUNTS MV Decel Time: 165 msec    Systemic VTI:  0.21 m MV E velocity: 88.80 cm/s  Systemic Diam: 2.50 cm MV A velocity: 89.90 cm/s MV E/A ratio:  0.99 Gaylyn Keas MD Electronically signed by Gaylyn Keas MD Signature Date/Time: 03/19/2024/11:09:26 AM    Final    US  RENAL Result Date: 03/19/2024 CLINICAL DATA:  [id number] AKI (acute kidney injury) (HCC) [id number] EXAM: RENAL / URINARY TRACT ULTRASOUND COMPLETE COMPARISON:  January 05, 2020, February 13, 2011 FINDINGS: Right Kidney: Renal measurements: 14.1 x 6.5 x 6.3 cm = volume: 302 mL. Normal echogenicity. Interpolar region cyst measuring 4.2 x 4.1 x 3.8 cm. No hydronephrosis or nephrolithiasis. Left Kidney: Renal measurements: 11.5 x 6.8 x 3.9 cm = volume: 160 mL. 1.6 x 1.8 x 1.8 cm cyst in the lower pole. Isoechoic structure measured by the sonographer in the left kidney measuring 2.1 x 2.5 x 1.5 cm. No mass. No hydronephrosis or nephrolithiasis. Bladder: Completely decompressed with a urinary catheter in place. Other: None. IMPRESSION: 1.  No hydronephrosis or nephrolithiasis. 2. Isoechoic structure in the left kidney (2.1 x 2.5 x 1.5 cm), measured by the sonographer, may represent a region of protuberant renal cortex or underlying renal lesion. A nonemergent CT or MRI of the abdomen with IV contrast should be considered for further characterization. Electronically Signed   By: Rance Burrows M.D.   On:  03/19/2024 10:48   MR BRAIN WO CONTRAST Result Date: 03/19/2024 CLINICAL DATA:  Altered mental status EXAM: MRI HEAD WITHOUT CONTRAST TECHNIQUE: Multiplanar, multiecho pulse sequences of the brain and surrounding structures were obtained without intravenous contrast. COMPARISON:  08/03/2015 FINDINGS: Brain: No acute infarct, mass effect or extra-axial collection. No acute or chronic hemorrhage. There is multifocal hyperintense T2-weighted signal within the white matter. Parenchymal volume and CSF spaces are normal. The midline structures are normal. Vascular: Normal flow voids. Skull and upper cervical spine: Normal calvarium and skull base. Visualized upper cervical spine and soft tissues are normal. Sinuses/Orbits:Left maxillary sinus retention cyst. No mastoid effusion. Normal orbits. IMPRESSION: 1. No acute intracranial abnormality. 2. Findings of chronic small vessel ischemia. Electronically Signed   By: Juanetta Nordmann M.D.   On: 03/19/2024 02:33   DG Chest 1 View Result Date: 03/18/2024 CLINICAL DATA:  Fever EXAM: CHEST  1 VIEW COMPARISON:  December 12, 2023 FINDINGS: Bilateral reticular interstitial infiltrates, edema versus pneumonia, correlate clinically. Minimal bilateral basilar atelectatic changes without consolidations No pleural effusion Heart upper limits of normal Stimulator device of the lower neck projections of the right hemithorax IMPRESSION: Bilateral reticular interstitial infiltrates, edema versus pneumonia, correlate clinically. Electronically Signed   By: Fredrich Jefferson M.D.   On: 03/18/2024 15:32      Iyanna Drummer T. Vedika Dumlao [facility]  If 7PM-7AM, please contact night-coverage [email or web address] 03/19/2024, 11:15 AM

## 2024-03-20 ENCOUNTER — Inpatient Hospital Stay (HOSPITAL_COMMUNITY)

## 2024-03-20 DIAGNOSIS — G9341 Metabolic encephalopathy: Secondary | ICD-10-CM | POA: Diagnosis not present

## 2024-03-20 DIAGNOSIS — M4316 Spondylolisthesis, lumbar region: Secondary | ICD-10-CM | POA: Diagnosis not present

## 2024-03-20 DIAGNOSIS — J9602 Acute respiratory failure with hypercapnia: Secondary | ICD-10-CM | POA: Diagnosis not present

## 2024-03-20 DIAGNOSIS — J9601 Acute respiratory failure with hypoxia: Secondary | ICD-10-CM | POA: Diagnosis not present

## 2024-03-20 LAB — CBC
HCT: 25.7 % — ABNORMAL LOW (ref 39.0–52.0)
Hemoglobin: 8.3 g/dL — ABNORMAL LOW (ref 13.0–17.0)
MCH: 29.2 pg (ref 26.0–34.0)
MCHC: 32.3 g/dL (ref 30.0–36.0)
MCV: 90.5 fL (ref 80.0–100.0)
Platelets: 200 10*3/uL (ref 150–400)
RBC: 2.84 MIL/uL — ABNORMAL LOW (ref 4.22–5.81)
RDW: 16.1 % — ABNORMAL HIGH (ref 11.5–15.5)
WBC: 5.7 10*3/uL (ref 4.0–10.5)
nRBC: 0 % (ref 0.0–0.2)

## 2024-03-20 LAB — BLOOD GAS, VENOUS
Acid-Base Excess: 4.9 mmol/L — ABNORMAL HIGH (ref 0.0–2.0)
Bicarbonate: 31.4 mmol/L — ABNORMAL HIGH (ref 20.0–28.0)
Drawn by: 66579
O2 Saturation: 83.1 %
Patient temperature: 36.7
pCO2, Ven: 52 mmHg (ref 44–60)
pH, Ven: 7.38 (ref 7.25–7.43)
pO2, Ven: 47 mmHg — ABNORMAL HIGH (ref 32–45)

## 2024-03-20 LAB — RETICULOCYTES
Immature Retic Fract: 24.1 % — ABNORMAL HIGH (ref 2.3–15.9)
RBC.: 3.27 MIL/uL — ABNORMAL LOW (ref 4.22–5.81)
Retic Count, Absolute: 69.2 10*3/uL (ref 19.0–186.0)
Retic Ct Pct: 2.1 % (ref 0.4–3.1)

## 2024-03-20 LAB — COMPREHENSIVE METABOLIC PANEL WITH GFR
ALT: 13 U/L (ref 0–44)
AST: 53 U/L — ABNORMAL HIGH (ref 15–41)
Albumin: 2.6 g/dL — ABNORMAL LOW (ref 3.5–5.0)
Alkaline Phosphatase: 61 U/L (ref 38–126)
Anion gap: 14 (ref 5–15)
BUN: 37 mg/dL — ABNORMAL HIGH (ref 6–20)
CO2: 26 mmol/L (ref 22–32)
Calcium: 8.8 mg/dL — ABNORMAL LOW (ref 8.9–10.3)
Chloride: 101 mmol/L (ref 98–111)
Creatinine, Ser: 2.2 mg/dL — ABNORMAL HIGH (ref 0.61–1.24)
GFR, Estimated: 35 mL/min — ABNORMAL LOW (ref >60)
Glucose, Bld: 121 mg/dL — ABNORMAL HIGH (ref 70–99)
Potassium: 3.5 mmol/L (ref 3.5–5.1)
Sodium: 141 mmol/L (ref 135–145)
Total Bilirubin: 0.9 mg/dL (ref 0.0–1.2)
Total Protein: 5.9 g/dL — ABNORMAL LOW (ref 6.5–8.1)

## 2024-03-20 LAB — CK: Total CK: 2282 U/L — ABNORMAL HIGH (ref 49–397)

## 2024-03-20 LAB — LIPID PANEL
Cholesterol: 73 mg/dL (ref 0–200)
HDL: 20 mg/dL — ABNORMAL LOW (ref >40)
LDL Cholesterol: 31 mg/dL (ref 0–99)
Total CHOL/HDL Ratio: 3.7 ratio
Triglycerides: 111 mg/dL (ref <150)
VLDL: 22 mg/dL (ref 0–40)

## 2024-03-20 LAB — IRON AND TIBC
Iron: 36 ug/dL — ABNORMAL LOW (ref 45–182)
Saturation Ratios: 16 % — ABNORMAL LOW (ref 17.9–39.5)
TIBC: 227 ug/dL — ABNORMAL LOW (ref 250–450)
UIBC: 191 ug/dL

## 2024-03-20 LAB — FERRITIN: Ferritin: 570 ng/mL — ABNORMAL HIGH (ref 24–336)

## 2024-03-20 NOTE — Progress Notes (Signed)
   Patient Name: TYREE FLUHARTY Date of Encounter: 03/20/2024 [facility] Cardiologist: Armida Lander, MD   Interval Summary  .    Resting comfortably in the bed Using incentive spirometer Denies any anginal symptoms  Vital Signs .    Vitals:   03/19/24 1916 03/20/24 0100 03/20/24 0440 03/20/24 0755  BP: 100/64 116/69 130/79 (!) 112/57  Pulse: 86 81 86 82  Resp: 16 18 18 16   Temp: 98.1 F (36.7 C) 98 F (36.7 C) 98.4 F (36.9 C) 98 F (36.7 C)  TempSrc: Oral Oral Oral Oral  SpO2: 98% 96% 100% 93%  Weight:   120.6 kg   Height:        Intake/Output Summary (Last 24 hours) at 03/20/2024 0826 Last data filed at 03/20/2024 0442 Gross per 24 hour  Intake 120 ml  Output 3050 ml  Net -2930 ml      03/20/2024    4:40 AM 03/19/2024    4:26 AM 03/18/2024   10:34 PM  Last 3 Weights  Weight (lbs) 265 lb 14 oz 264 lb 12.4 oz 271 lb 9.7 oz  Weight (kg) 120.6 kg 120.1 kg 123.2 kg      Telemetry/ECG    Sinus rhythm, HR 90s - Personally Reviewed  Physical Exam .   GEN: No acute distress, laying in bed.   Neck: No JVD Cardiac: RRR, no murmurs, rubs, or gallops.  Respiratory: Clear to auscultation bilaterally, has  on but not in his nostrils. GI: Soft, nontender, non-distended  MS: No edema  Assessment & Plan .   KARNELL VANDERLOOP is a 58 y.o. male with a hx of AAA without rupture, OSA on inspire, PAD s/p right SFA angioplasty/stent, HTN, hypothyroidism, hypogonadism, anxiety, depression, bipolar disorder, tobacco use disorder, GERD, morbid obesity, chronic pain syndrome, and lumbar spondylolisthesis, who is being followed for the evaluation of elevated troponin    Elevated troponin  2,719 > 2,806 > 3,556 Suspect demand ischemia in the setting of hypoxia, acute respiratory failure, AKI/rhabdo Denies any chest pain No ischemic changes on EKG Normal EF on echo, 60-65% Metoprolol  held by primary due to hypotension -- resume when able Statin held due to  rhabdomyolysis -- resume when able Not a candidate for ischemic studies requiring contrast due to AKI  Per primary L4-5 spondylolisthesis with lumbar radiculopathy s/p back surgery 4/16 Acute metabolic encephalopathy  Acute respiratory failure with hypoxia and hypercapnia  AKI Rhabdomyolysis AAA HTN PAD OSA Hypothyroidism   For questions or updates, please contact [facility] Please consult [email or web address] for contact info under        Signed, Jiles Mote, PA-C

## 2024-03-20 NOTE — Plan of Care (Signed)
  Problem: Education: Goal: Knowledge of General Education information will improve Description: Including pain rating scale, medication(s)/side effects and non-pharmacologic comfort measures Outcome: Progressing   Problem: Health Behavior/Discharge Planning: Goal: Ability to manage health-related needs will improve Outcome: Progressing   Problem: Clinical Measurements: Goal: Ability to maintain clinical measurements within normal limits will improve Outcome: Progressing Goal: Cardiovascular complication will be avoided Outcome: Progressing   Problem: Activity: Goal: Risk for activity intolerance will decrease Outcome: Progressing   Problem: Coping: Goal: Level of anxiety will decrease Outcome: Progressing   Problem: Elimination: Goal: Will not experience complications related to bowel motility Outcome: Progressing

## 2024-03-20 NOTE — Progress Notes (Signed)
 OT Cancellation Note  Patient Details Name: ELLISON RIETH MRN: [id number] DOB: 07-03-1966   Cancelled Treatment:    Reason Eval/Treat Not Completed: Patient at procedure or test/ unavailable (Patient off unit for swallow eval. OT to attempt later today as schedule permits) Anitra Barn, OTA Acute Rehabilitation Services  Office [phone]  Jovita Nipper 03/20/2024, 9:30 AM

## 2024-03-20 NOTE — TOC Progression Note (Signed)
 Transition of Care [facility]) - Progression Note    Patient Details  Name: Tony Campos MRN: [id number] Date of Birth: [birth year]  Transition of Care [facility]) CM/SW Contact  Tom-Johnson, Renesmae Donahey Daphne, RN Phone Number: 03/20/2024, 2:36 PM  Clinical Narrative:     CM spoke with patient and wife, Milana Ali at bedside about [facility] concern. Milana Ali states patient's BSC was delivered to his room on 5N01 and did not transfer with him to 3E30. CM called and spoke with %n Charge Nurse, Marysue Sola and he states a BSC is at the [employer]. CM retrieved BSC and delivered to patient and Milana Ali at bedside at this time.   CM will continue to follow.        Expected Discharge Plan and Services         Expected Discharge Date: 03/18/24                                     Social Determinants of Health (SDOH) Interventions SDOH Screenings   Food Insecurity: No Food Insecurity (03/12/2024)  Housing: Low Risk  (03/12/2024)  Transportation Needs: No Transportation Needs (03/12/2024)  Utilities: Not At Risk (03/12/2024)  Alcohol Screen: Low Risk  (12/05/2022)  Depression (PHQ2-9): High Risk (03/04/2024)  Financial Resource Strain: Medium Risk (12/11/2023)  Physical Activity: Insufficiently Active (12/11/2023)  Social Connections: Unknown (12/11/2023)  Stress: Stress Concern Present (12/11/2023)  Tobacco Use: High Risk (03/12/2024)    Readmission Risk Interventions     No data to display

## 2024-03-20 NOTE — Progress Notes (Signed)
 PT Cancellation Note  Patient Details Name: Tony Campos MRN: [id number] DOB: [birth year]   Cancelled Treatment:    Reason Eval/Treat Not Completed: Patient at procedure or test/unavailable (Pt off the floor for a swallow evaluation. Will follow-up as schedule permits.)  [name], PT, DPT Acute Rehabilitation Services Office: [phone] Secure Chat Preferred  [name] 03/20/2024, 9:27 AM

## 2024-03-20 NOTE — Progress Notes (Addendum)
 PROGRESS NOTE  Tony Campos ZOX:[id number] DOB: May 06, 1966   PCP: Austine Lefort, MD  Patient is from: Home.  DOA: 03/12/2024 LOS: 1  Chief complaints No chief complaint on file.    Brief Narrative / Interim history: 58 year old M with PMH of AAA without rupture, OSA on inspire, PAD s/p right SFA angioplasty/stent in 12/2025, HTN, hypothyroidism, hypogonadism, anxiety, depression, bipolar disorder, tobacco use disorder, GERD, morbid obesity and lumbar spondylolithiasis.  Patient was admitted by neurosurgery for lumbar spondylolisthesis and underwent surgery.  Postoperative course complicated by acute respiratory failure, AKI and encephalopathy for which she was transferred to hospital service on 03/18/2024.  On chart review, patient hypoxic requiring up to 6 L.  ABG with mild chronic respiratory acidosis.  Patient was on multiple sedating medication for pain and anxiety.  Cr up to 2.56 (baseline 0.8-1.0).  Troponin elevated to 2600 x 3.  CK elevated to 2600.  Patient without chest pain or ischemic finding on EKG.  CXR with bilateral reticular interstitial infiltrates, edema versus pneumonia.  B12 low at 160.  Echocardiogram and VQ scan ordered.  Started on IV heparin  pending VQ scan.  Mental status improved after adjusting sedating medications.  Respiratory failure resolving.  AKI improved with IV fluid.  Echocardiogram and VQ scan without significant finding.  Heparin  discontinued.  Evaluated by cardiology.    Subjective: Seen and examined earlier this morning.  No major events overnight of this morning.  Reports having big bowel movement earlier this morning.  No complaints.  Saturating in mid 90s on 1 L by [employer].  Patient's wife at bedside.  Objective: Vitals:   03/20/24 0100 03/20/24 0440 03/20/24 0755 03/20/24 1207  BP: 116/69 130/79 (!) 112/57 (!) 144/74  Pulse: 81 86 82 92  Resp: 18 18 16 16   Temp: 98 F (36.7 C) 98.4 F (36.9 C) 98 F (36.7 C) 98 F (36.7 C)  TempSrc: Oral  Oral Oral Oral  SpO2: 96% 100% 93% 93%  Weight:  120.6 kg    Height:        Examination:  GENERAL: No apparent distress.  Nontoxic. HEENT: MMM.  Vision and hearing grossly intact.  NECK: Supple.  No apparent JVD.  RESP:  No IWOB.  Fair aeration bilaterally.  Excellent effort on incentive spirometry. CVS:  RRR. Heart sounds normal.  ABD/GI/GU: BS+. Abd soft, NTND.  MSK/EXT:  Moves extremities. No apparent deformity. No edema.  SKIN: Surgical wound DCI. NEURO: Awake.  Oriented x 4.  No apparent focal neuro deficit. PSYCH: Calm. Normal affect.   Consultants:  Neurosurgery admitted patient  Procedures: 4/16-Bilateral L4-5 laminotomy/foraminotomies/medial facetectomy to decompress the bilateral L4 and L5 nerve roots by Dr. Larrie Po  Microbiology summarized: 4/22-blood cultures NGTD 4/22-urine culture pending  Assessment and plan: L4-5 spondylolisthesis with lumbar radiculopathy, neurogenic claudication -S/p Bilateral L4-5 laminotomy/foraminotomies/medial facetectomy to decompress on 4/16 -Continue PT/OT.  OOB. -Adjusted pain medication due to encephalopathy and respiratory failure -Encouraged smoking cessation -Subcu heparin  for VTE prophylaxis -PT/OT eval  Acute metabolic encephalopathy: This is likely iatrogenic from multiple sedating medications.  Patient was on MS Contin , clonazepam , Flexeril , Lyrica  and IV fentanyl . MRI brain without acute finding.  B12 low at 160.  VBG with mild compensated respiratory acidosis.  Encephalopathy resolved after adjusting meds.  Oriented x 4.   No focal neurodeficit on exam. - Change MS Contin  to OxyContin  given renal failure.  Decreased dose. - Discontinued Flexeril  - Decreased Lyrica   and Klonopin . - Reorientation and delirium precaution. - Asked wife to  activate his Aspire when he falls asleep.  - Vitamin B12 injection followed by p.o.   Acute respiratory failure with hypoxia and hypercapnia: This is likely from multiple sedating  medication underlying OSA.  Patient was not activating his aspire.  VBG with mild compensated respiratory acidosis.  No significant event on CXR.  Excellent effort on incentive parameter.  VQ scan negative for PE.  Appears euvolemic on exam.  BNP 228.  TTE without significant finding.  Resolving.  Currently saturating in mid 90s on 1 L by nasal cannula. -Adjusted pain meds as above -Encourage incentive spirometry, OOB, PT/OT -Wean off oxygen  as able.  Encouraged to use his Aspire.  If not, we may try CPAP -Ambulatory saturation  Acute kidney injury: Unclear etiology.  No contrast administration.  Losartan  discontinued on 4/20.  Has urinary retention with removal of 800 cc urine on 4/22.  Renal US  with no hydronephrosis of nephrolithiasis but 2.1 x 2.5 x 1.5 cm isoechoic structure of left kidney.  Improved. Recent Labs    06/18/23 1628 09/28/23 1554 03/06/24 1330 03/18/24 0848 03/18/24 2116 03/19/24 0248 03/20/24 0822  BUN 11 6* 8 38* 44* 44* 37*  CREATININE 0.85 0.83 1.07 2.56* 2.87* 2.94* 2.20*  - Continue Foley catheter.  Voiding trial prior to discharge - Continue IV fluids - Avoid nephrotoxic meds - Strict intake and output  Urinary retention: Patient had 800 cc urine removed with Foley catheter on 4/22.  This could be due to constipation.  He also had back surgery on 4/16.   Renal US  as above. - Continue Foley catheter - Voiding trial prior to discharge - May consider imaging of his back but defer this to neurosurgery - Treat constipation.  Acute blood loss anemia: Reported EBL of 200 cc.  No obvious bleeding.  H&H currently stable.  No nutritional deficiency on anemia panel Recent Labs    06/18/23 1628 03/04/24 1218 03/18/24 1458 03/19/24 0248 03/20/24 0235  HGB 13.4 12.4* 8.4* 9.0* 8.3*  - Continue monitoring  Elevated troponin: High-sensitivity troponin 2626, 2700, 2800.  Could be patient of demand ischemia due to respiratory failure and delayed clearance due to renal  failure.  Patient without chest pain or acute ischemic finding on EKG.  TTE reassuring but not able to assess RWMA.  No additional input from cardiology.  Elevated CK: CK trended from 2600-3500> 2300 - Continue holding statin - Continue IV fluid - Continue monitoring  Vitamin B12 deficiency: B12 160 - Vitamin B12 injection 1000 mcg daily for 3 days followed by p.o.  Left renal lesion: Renal ultrasound showed 2.1 x 2.5 x 1.5 cm isoechoic structure of left kidney.  Nonemergent CT or MRI of abdomen with IV contrast recommended for further catheterization. -Will wait until AKI resolves.  This can be done outpatient. - Discussed with patient and patient's wife  AAA/history of PAD - Outpatient follow-up  Anxiety, depression and bipolar disorder - Decrease Klonopin  due to encephalopathy - Continue Cymbalta , Lamictal , Rexulti   Tobacco use disorder - Encouraged smoking cessation  OSA - Advised wife to activate aspire, we may have to try CPAP  Constipation: Wife reported last bowel movement was before admission.  Resolved. -Continue bowel regimen  Hypothyroidism: TSH slightly low with slightly elevated free T4. -Continue home Synthroid  - Recheck thyroid  panel in 4 to 6 weeks  Class II obesity Body mass index is 36.06 kg/m. - Encourage lifestyle change to lose weight.           DVT prophylaxis:  heparin  injection  5,000 Units Start: 03/20/24 0600 SCD's Start: 03/12/24 1451  Code Status: Full code Family Communication: Updated patient's wife at bedside Level of care: Med-Surg Status is: Inpatient Remains inpatient appropriate because: Acute metabolic encephalopathy, respiratory failure, AKI   Final disposition: To be determined   55 minutes with more than 50% spent in reviewing records, counseling patient/family and coordinating care.   Sch Meds:  Scheduled Meds:  acetaminophen   1,000 mg Oral Q8H   brexpiprazole   2 mg Oral Daily   Chlorhexidine  Gluconate Cloth  6  each Topical Q0600   clonazePAM   0.5 mg Oral BID   cyanocobalamin   1,000 mcg Intramuscular Daily   Followed by   Cecily Cohen ON 03/22/2024] vitamin B-12  1,000 mcg Oral Daily   DULoxetine   60 mg Oral Daily   ferrous sulfate   325 mg Oral QHS   heparin  injection (subcutaneous)  5,000 Units Subcutaneous Q8H   lamoTRIgine   200 mg Oral BID   levothyroxine   125 mcg Oral Q0600   nicotine   21 mg Transdermal Daily   oxyCODONE   10 mg Oral Q12H   pantoprazole   40 mg Oral Daily   pregabalin   50 mg Oral BID   sodium chloride  flush  3 mL Intravenous Q12H   valACYclovir   500 mg Oral Daily   Continuous Infusions:  sodium chloride  75 mL/hr at 03/19/24 2102   PRN Meds:.bisacodyl , fentaNYL  (SUBLIMAZE ) injection, menthol -cetylpyridinium **OR** phenol, ondansetron  **OR** ondansetron  (ZOFRAN ) IV, [COMPLETED] polyethylene glycol **FOLLOWED BY** polyethylene glycol, sodium chloride  flush  Antimicrobials: Anti-infectives (From admission, onward)    Start     Dose/Rate Route Frequency Ordered Stop   03/13/24 1000  valACYclovir  (VALTREX ) tablet 500 mg        500 mg Oral Daily 03/12/24 1450     03/12/24 1700  ceFAZolin  (ANCEF ) IVPB 3g/150 mL premix        3 g 300 mL/hr over 30 Minutes Intravenous Every 8 hours 03/12/24 1450 03/12/24 2333   03/12/24 0800  ceFAZolin  (ANCEF ) IVPB 3g/150 mL premix        3 g 300 mL/hr over 30 Minutes Intravenous On call to O.R. 03/12/24 1610 03/12/24 0900        I have personally reviewed the following labs and images: CBC: Recent Labs  Lab 03/18/24 1458 03/19/24 0248 03/20/24 0235  WBC 8.4 7.8 5.7  NEUTROABS 6.3 5.2  --   HGB 8.4* 9.0* 8.3*  HCT 25.0* 27.7* 25.7*  MCV 89.6 90.5 90.5  PLT 204 208 200   BMP &GFR Recent Labs  Lab 03/18/24 0848 03/18/24 2116 03/19/24 0248 03/20/24 0822  NA 138 136 138 141  K 3.7 3.9 5.2* 3.5  CL 96* 98 98 101  CO2 25 26 28 26   GLUCOSE 98 109* 104* 121*  BUN 38* 44* 44* 37*  CREATININE 2.56* 2.87* 2.94* 2.20*  CALCIUM  9.0  8.1* 8.5* 8.8*  PHOS  --   --  5.6*  --    Estimated Creatinine Clearance: 50.9 mL/min (A) (by C-G formula based on SCr of 2.2 mg/dL (H)). Liver & Pancreas: Recent Labs  Lab 03/18/24 1458 03/19/24 0248 03/20/24 0822  AST 53* 56* 53*  ALT 12 13 13   ALKPHOS 54 57 61  BILITOT 1.1 1.0 0.9  PROT 5.7* 5.7* 5.9*  ALBUMIN  2.5* 2.6* 2.6*   No results for input(s): "LIPASE", "AMYLASE" in the last 168 hours. Recent Labs  Lab 03/18/24 1856  AMMONIA 37*   Diabetic: Recent Labs    03/18/24 1856  HGBA1C 5.0  Recent Labs  Lab 03/14/24 2256  GLUCAP 110*   Cardiac Enzymes: Recent Labs  Lab 03/18/24 1938 03/19/24 0832 03/20/24 0822  CKTOTAL 2,590* 3,556* 2,282*   No results for input(s): "PROBNP" in the last 8760 hours. Coagulation Profile: No results for input(s): "INR", "PROTIME" in the last 168 hours. Thyroid  Function Tests: Recent Labs    03/19/24 0831 03/19/24 1230  TSH 0.310*  --   FREET4  --  1.34*   Lipid Profile: Recent Labs    03/20/24 0235  CHOL 73  HDL 20*  LDLCALC 31  TRIG 573  CHOLHDL 3.7   Anemia Panel: Recent Labs    03/18/24 1458 03/20/24 0822 03/20/24 0823  VITAMINB12 160*  --   --   FOLATE 7.4  --   --   FERRITIN  --  570*  --   TIBC  --  227*  --   IRON  --  36*  --   RETICCTPCT  --   --  2.1   Urine analysis:    Component Value Date/Time   COLORURINE YELLOW 03/18/2024 1342   APPEARANCEUR HAZY (A) 03/18/2024 1342   LABSPEC 1.011 03/18/2024 1342   PHURINE 5.0 03/18/2024 1342   GLUCOSEU NEGATIVE 03/18/2024 1342   HGBUR SMALL (A) 03/18/2024 1342   BILIRUBINUR NEGATIVE 03/18/2024 1342   KETONESUR NEGATIVE 03/18/2024 1342   PROTEINUR 100 (A) 03/18/2024 1342   UROBILINOGEN 0.2 11/10/2013 1522   NITRITE NEGATIVE 03/18/2024 1342   LEUKOCYTESUR NEGATIVE 03/18/2024 1342   Sepsis Labs: Invalid input(s): "PROCALCITONIN", "LACTICIDVEN"  Microbiology: Recent Results (from the past 240 hours)  Culture, blood (Routine X 2) w Reflex to  ID Panel     Status: None (Preliminary result)   Collection Time: 03/18/24  2:58 PM   Specimen: BLOOD RIGHT ARM  Result Value Ref Range Status   Specimen Description BLOOD RIGHT ARM  Final   Special Requests   Final    BOTTLES DRAWN AEROBIC AND ANAEROBIC Blood Culture results may not be optimal due to an inadequate volume of blood received in culture bottles   Culture   Final    NO GROWTH 2 DAYS Performed at [facility], [address] [place], [place] [address]    Report Status PENDING  Incomplete  Culture, blood (Routine X 2) w Reflex to ID Panel     Status: None (Preliminary result)   Collection Time: 03/18/24  2:59 PM   Specimen: BLOOD LEFT ARM  Result Value Ref Range Status   Specimen Description BLOOD LEFT ARM  Final   Special Requests   Final    BOTTLES DRAWN AEROBIC AND ANAEROBIC Blood Culture results may not be optimal due to an inadequate volume of blood received in culture bottles   Culture   Final    NO GROWTH 2 DAYS Performed at [facility], [address] [place], [place] [address]    Report Status PENDING  Incomplete  Urine Culture (for pregnant, neutropenic or urologic patients or patients with an indwelling urinary catheter)     Status: None   Collection Time: 03/18/24  5:00 PM   Specimen: Urine, Clean Catch  Result Value Ref Range Status   Specimen Description URINE, CLEAN CATCH  Final   Special Requests NONE  Final   Culture   Final    NO GROWTH Performed at [facility], [address] [place], [place] [address]    Report Status 03/19/2024 FINAL  Final    Radiology  Studies: NM Pulmonary Perfusion Result Date: 03/19/2024 CLINICAL DATA:  Pulmonary embolism suspected.   High probability EXAM: NUCLEAR MEDICINE PERFUSION LUNG SCAN TECHNIQUE: Perfusion images were obtained in multiple projections after intravenous injection of radiopharmaceutical. RADIOPHARMACEUTICALS:  4.0 mCi Tc-19m MAA COMPARISON:  None Available. FINDINGS: No  wedge-shaped peripheral perfusion defect within LEFT or RIGHT lung to suggest acute pulmonary embolism. Normal perfusion pattern. IMPRESSION: No evidence acute pulmonary embolism. Electronically Signed   By: Deboraha Fallow M.D.   On: 03/19/2024 16:20      Phynix Horton T. Emmerson Shuffield [facility]  If 7PM-7AM, please contact night-coverage [email or web address] 03/20/2024, 12:48 PM

## 2024-03-20 NOTE — Progress Notes (Signed)
 Occupational Therapy Treatment Patient Details Name: Tony Campos MRN: [id number] DOB: 04-Apr-1966 Today's Date: 03/20/2024   History of present illness Patient is 58 y.o. male s/p L4-5 discectomy on 03/12/24 due to spondylolisthesis and recurrent herniated disc. PMH significant for AAA (without rupture), GERD, anxiety, depression, bipolar disorder, HLD, HTN, hypothyroidism, meningitis, PAD, Sleep apnea, R knee surgery x3, L knee surgery x1, appendectomy, cholecystectomy.   OT comments  Pt received in supine and agreeable to session. Bed mobility completed with mod A for rolling and power up to adhere to precautions. STS performed from raised bed with CGA for safety, pt able to ambulate with CGA as well. Self care completed seated at sink, pt requiring cues for sequencing and initiation at times. Pt able to adhere to precautions with VC's. Pt returned to recliner with all needs met. Acute OT to continue to follow to address established goals to facilitate DC to next venue of care.        If plan is discharge home, recommend the following:  A little help with walking and/or transfers;A little help with bathing/dressing/bathroom;Assistance with cooking/housework;Supervision due to cognitive status   Equipment Recommendations  BSC/3in1;Tub/shower seat    Recommendations for Other Services      Precautions / Restrictions Precautions Precautions: Fall;Back Precaution Booklet Issued: Yes (comment) Recall of Precautions/Restrictions: Impaired Precaution/Restrictions Comments: cues to adhere to precautions Required Braces or Orthoses: Spinal Brace Spinal Brace: Lumbar corset;Applied in sitting position Restrictions Weight Bearing Restrictions Per Provider Order: No       Mobility Bed Mobility Overal bed mobility: Needs Assistance Bed Mobility: Rolling, Sidelying to Sit Rolling: Mod assist Sidelying to sit: Mod assist       General bed mobility comments: log roll technique and  assistance for technique and power up    Transfers Overall transfer level: Needs assistance Equipment used: Rolling walker (2 wheels) Transfers: Sit to/from Stand Sit to Stand: Contact guard assist           General transfer comment: With increased time     Balance Overall balance assessment: Needs assistance Sitting-balance support: No upper extremity supported, Feet supported Sitting balance-Leahy Scale: Fair     Standing balance support: Bilateral upper extremity supported, During functional activity, Reliant on assistive device for balance Standing balance-Leahy Scale: Poor Standing balance comment: reliant on external support                           ADL either performed or assessed with clinical judgement   ADL Overall ADL's : Needs assistance/impaired     Grooming: Wash/dry face;Oral care;Sitting;Supervision/safety Grooming Details (indicate cue type and reason): seated at sink, cueing for initiation and sequencing         Upper Body Dressing : Minimal assistance;Sitting Upper Body Dressing Details (indicate cue type and reason): don back brace seated     Toilet Transfer: Contact guard assist;BSC/3in1;Ambulation;Rolling walker (2 wheels) Toilet Transfer Details (indicate cue type and reason): simulated transfer on BSC, did not complete toileting only tranfer         Functional mobility during ADLs: Contact guard assist General ADL Comments: pt with minor cueing to maintain back precautions, especially bending    Extremity/Trunk Assessment              Vision       Perception     Praxis     Communication Communication Communication: No apparent difficulties   Cognition Arousal: Alert Behavior During Therapy: [facility] for  tasks assessed/performed Cognition: Cognition impaired     Awareness: Online awareness impaired Memory impairment (select all impairments): Short-term memory, Declarative long-term memory Attention impairment  (select first level of impairment): Focused attention, Selective attention, Sustained attention Executive functioning impairment (select all impairments): Problem solving, Reasoning, Initiation, Sequencing OT - Cognition Comments: pt delayed initiation and problem solving, easily distracted by other stimuli                 Following commands: Impaired Following commands impaired: Follows one step commands with increased time      Cueing   Cueing Techniques: Verbal cues  Exercises      Shoulder Instructions       General Comments VSS    Pertinent Vitals/ Pain       Pain Assessment Pain Assessment: Faces Faces Pain Scale: Hurts even more Pain Location: back Pain Descriptors / Indicators: Discomfort, Operative site guarding Pain Intervention(s): Monitored during session  Home Living                                          Prior Functioning/Environment              Frequency  Min 3X/week        Progress Toward Goals  OT Goals(current goals can now be found in the care plan section)  Progress towards OT goals: Progressing toward goals  Acute Rehab OT Goals Patient Stated Goal: none stated OT Goal Formulation: With patient Time For Goal Achievement: 03/31/24 Potential to Achieve Goals: Good ADL Goals Pt Will Perform Grooming: with supervision;standing Pt Will Perform Upper Body Dressing: with supervision;sitting Pt Will Perform Lower Body Dressing: with supervision;sit to/from stand;sitting/lateral leans Pt Will Transfer to Toilet: with supervision;ambulating;regular height toilet Additional ADL Goal #1: pt will verbalize and adhere to back precautions with min cues during session in prep for ADLs  Plan      Co-evaluation                 AM-PAC OT "6 Clicks" Daily Activity     Outcome Measure   Help from another person eating meals?: None Help from another person taking care of personal grooming?: A Little Help from  another person toileting, which includes using toliet, bedpan, or urinal?: A Little Help from another person bathing (including washing, rinsing, drying)?: A Little Help from another person to put on and taking off regular upper body clothing?: A Little Help from another person to put on and taking off regular lower body clothing?: A Little 6 Click Score: 19    End of Session Equipment Utilized During Treatment: Gait belt;Rolling walker (2 wheels);Back brace  OT Visit Diagnosis: Unsteadiness on feet (R26.81);Other abnormalities of gait and mobility (R26.89);Muscle weakness (generalized) (M62.81)   Activity Tolerance Patient tolerated treatment well   Patient Left in chair;with call bell/phone within reach;with family/visitor present   Nurse Communication Mobility status        Time: 8841-6606 OT Time Calculation (min): 41 min  Charges: OT General Charges $OT Visit: 1 Visit OT Treatments $Self Care/Home Management : 38-52 mins  Uzoma Vivona, BS, OTA/S   Caitriona Sundquist 03/20/2024, 2:06 PM

## 2024-03-20 NOTE — Progress Notes (Signed)
 SLP Cancellation Note  Patient Details Name: Tony Campos MRN: [id number] DOB: 1965-12-02   Cancelled treatment:       Reason Eval/Treat Not Completed: Patient declined to complete MBS this date, no reason specified. SLP will f/u at least briefly at bedside, but do not anticipate post-acute needs.   Amil Kale, M.A., CCC-SLP Speech Language Pathology, Acute Rehabilitation Services  Secure Chat preferred [phone]  03/20/2024, 9:31 AM

## 2024-03-20 NOTE — Progress Notes (Signed)
 Subjective: The patient is alert and pleasant.  He is in no apparent distress.  Objective: Vital signs in last 24 hours: Temp:  [97.9 F (36.6 C)-98.4 F (36.9 C)] 97.9 F (36.6 C) (04/24 1520) Pulse Rate:  [81-92] 84 (04/24 1520) Resp:  [16-18] 16 (04/24 1520) BP: (100-144)/(57-79) 135/65 (04/24 1520) SpO2:  [93 %-100 %] 98 % (04/24 1520) Weight:  [120.6 kg] 120.6 kg (04/24 0440) Estimated body mass index is 36.06 kg/m as calculated from the following:   Height as of this encounter: 6' (1.829 m).   Weight as of this encounter: 120.6 kg.   Intake/Output from previous day: 04/23 0701 - 04/24 0700 In: 120 [P.O.:120] Out: 3050 [Urine:3050] Intake/Output this shift: Total I/O In: 240 [P.O.:240] Out: -   Physical exam the patient is alert and oriented.  His strength is normal.  His lumbar incision is covered with DuoDERM.  Lab Results: Recent Labs    03/19/24 0248 03/20/24 0235  WBC 7.8 5.7  HGB 9.0* 8.3*  HCT 27.7* 25.7*  PLT 208 200   BMET Recent Labs    03/19/24 0248 03/20/24 0822  NA 138 141  K 5.2* 3.5  CL 98 101  CO2 28 26  GLUCOSE 104* 121*  BUN 44* 37*  CREATININE 2.94* 2.20*  CALCIUM  8.5* 8.8*    Studies/Results: NM Pulmonary Perfusion Result Date: 03/19/2024 CLINICAL DATA:  Pulmonary embolism suspected.   High probability EXAM: NUCLEAR MEDICINE PERFUSION LUNG SCAN TECHNIQUE: Perfusion images were obtained in multiple projections after intravenous injection of radiopharmaceutical. RADIOPHARMACEUTICALS:  4.0 mCi Tc-40m MAA COMPARISON:  None Available. FINDINGS: No wedge-shaped peripheral perfusion defect within LEFT or RIGHT lung to suggest acute pulmonary embolism. Normal perfusion pattern. IMPRESSION: No evidence acute pulmonary embolism. Electronically Signed   By: Deboraha Fallow M.D.   On: 03/19/2024 16:20   ECHOCARDIOGRAM COMPLETE Result Date: 03/19/2024    ECHOCARDIOGRAM REPORT   Patient Name:   Tony Campos Sigel Date of Exam: 03/19/2024 Medical  Rec #:  [id number]       Height:       72.0 in Accession #:    [id number]      Weight:       264.8 lb Date of Birth:  1966/11/23       BSA:          2.400 m Patient Age:    57 years        BP:           115/84 mmHg Patient Gender: M               HR:           97 bpm. Exam Location:  Inpatient Procedure: 2D Echo, Color Doppler and Cardiac Doppler (Both Spectral and Color            Flow Doppler were utilized during procedure). Indications:    Pulmonary Embolus  History:        Patient has no prior history of Echocardiogram examinations.                 AAA; Risk Factors:Dyslipidemia and Hypertension.  Sonographer:    Hersey Lorenzo RDCS Referring Phys: [phone] EKTA V PATEL IMPRESSIONS  1. Left ventricular ejection fraction, by estimation, is 60 to 65%. The left ventricle has normal function. Left ventricular endocardial border not optimally defined to evaluate regional wall motion. There is moderate concentric left ventricular hypertrophy. Left ventricular diastolic parameters were normal.  2. Right ventricular systolic function  is normal. The right ventricular size is normal. Tricuspid regurgitation signal is inadequate for assessing PA pressure.  3. The mitral valve is normal in structure. No evidence of mitral valve regurgitation. No evidence of mitral stenosis.  4. The aortic valve was not well visualized. Aortic valve regurgitation is not visualized. Aortic valve sclerosis/calcification is present, without any evidence of aortic stenosis.  5. The inferior vena cava is normal in size with greater than 50% respiratory variability, suggesting right atrial pressure of 3 mmHg. FINDINGS  Left Ventricle: Left ventricular ejection fraction, by estimation, is 60 to 65%. The left ventricle has normal function. Left ventricular endocardial border not optimally defined to evaluate regional wall motion. The left ventricular internal cavity size was normal in size. There is moderate concentric left ventricular hypertrophy. Left  ventricular diastolic parameters were normal. Normal left ventricular filling pressure. Right Ventricle: The right ventricular size is normal. No increase in right ventricular wall thickness. Right ventricular systolic function is normal. Tricuspid regurgitation signal is inadequate for assessing PA pressure. Left Atrium: Left atrial size was normal in size. Right Atrium: Right atrial size was normal in size. Pericardium: There is no evidence of pericardial effusion. Mitral Valve: The mitral valve is normal in structure. No evidence of mitral valve regurgitation. No evidence of mitral valve stenosis. Tricuspid Valve: The tricuspid valve is normal in structure. Tricuspid valve regurgitation is not demonstrated. No evidence of tricuspid stenosis. Aortic Valve: The aortic valve was not well visualized. Aortic valve regurgitation is not visualized. Aortic valve sclerosis/calcification is present, without any evidence of aortic stenosis. Pulmonic Valve: The pulmonic valve was normal in structure. Pulmonic valve regurgitation is trivial. No evidence of pulmonic stenosis. Aorta: The aortic root is normal in size and structure. Venous: The inferior vena cava is normal in size with greater than 50% respiratory variability, suggesting right atrial pressure of 3 mmHg. IAS/Shunts: No atrial level shunt detected by color flow Doppler.  LEFT VENTRICLE PLAX 2D LVIDd:         5.30 cm   Diastology LVIDs:         3.60 cm   LV e' medial:    8.16 cm/s LV PW:         1.40 cm   LV E/e' medial:  10.9 LV IVS:        1.40 cm   LV e' lateral:   10.30 cm/s LVOT diam:     2.50 cm   LV E/e' lateral: 8.6 LV SV:         104 LV SV Index:   43 LVOT Area:     4.91 cm  RIGHT VENTRICLE         IVC TAPSE (M-mode): 2.3 cm  IVC diam: 2.00 cm LEFT ATRIUM             Index        RIGHT ATRIUM           Index LA diam:        3.70 cm 1.54 cm/m   RA Area:     19.00 cm LA Vol (A2C):   51.2 ml 21.33 ml/m  RA Volume:   52.30 ml  21.79 ml/m LA Vol (A4C):    47.8 ml 19.91 ml/m LA Biplane Vol: 50.2 ml 20.91 ml/m  AORTIC VALVE LVOT Vmax:   133.00 cm/s LVOT Vmean:  76.700 cm/s LVOT VTI:    0.212 m  AORTA Ao Root diam: 3.20 cm Ao Asc diam:  3.30 cm MITRAL VALVE  MV Area (PHT): 4.60 cm    SHUNTS MV Decel Time: 165 msec    Systemic VTI:  0.21 m MV E velocity: 88.80 cm/s  Systemic Diam: 2.50 cm MV A velocity: 89.90 cm/s MV E/A ratio:  0.99 Gaylyn Keas MD Electronically signed by Gaylyn Keas MD Signature Date/Time: 03/19/2024/11:09:26 AM    Final    US  RENAL Result Date: 03/19/2024 CLINICAL DATA:  [id number] AKI (acute kidney injury) (HCC) [id number] EXAM: RENAL / URINARY TRACT ULTRASOUND COMPLETE COMPARISON:  January 05, 2020, February 13, 2011 FINDINGS: Right Kidney: Renal measurements: 14.1 x 6.5 x 6.3 cm = volume: 302 mL. Normal echogenicity. Interpolar region cyst measuring 4.2 x 4.1 x 3.8 cm. No hydronephrosis or nephrolithiasis. Left Kidney: Renal measurements: 11.5 x 6.8 x 3.9 cm = volume: 160 mL. 1.6 x 1.8 x 1.8 cm cyst in the lower pole. Isoechoic structure measured by the sonographer in the left kidney measuring 2.1 x 2.5 x 1.5 cm. No mass. No hydronephrosis or nephrolithiasis. Bladder: Completely decompressed with a urinary catheter in place. Other: None. IMPRESSION: 1.  No hydronephrosis or nephrolithiasis. 2. Isoechoic structure in the left kidney (2.1 x 2.5 x 1.5 cm), measured by the sonographer, may represent a region of protuberant renal cortex or underlying renal lesion. A nonemergent CT or MRI of the abdomen with IV contrast should be considered for further characterization. Electronically Signed   By: Rance Burrows M.D.   On: 03/19/2024 10:48   MR BRAIN WO CONTRAST Result Date: 03/19/2024 CLINICAL DATA:  Altered mental status EXAM: MRI HEAD WITHOUT CONTRAST TECHNIQUE: Multiplanar, multiecho pulse sequences of the brain and surrounding structures were obtained without intravenous contrast. COMPARISON:  08/03/2015 FINDINGS: Brain: No acute infarct, mass effect  or extra-axial collection. No acute or chronic hemorrhage. There is multifocal hyperintense T2-weighted signal within the white matter. Parenchymal volume and CSF spaces are normal. The midline structures are normal. Vascular: Normal flow voids. Skull and upper cervical spine: Normal calvarium and skull base. Visualized upper cervical spine and soft tissues are normal. Sinuses/Orbits:Left maxillary sinus retention cyst. No mastoid effusion. Normal orbits. IMPRESSION: 1. No acute intracranial abnormality. 2. Findings of chronic small vessel ischemia. Electronically Signed   By: Juanetta Nordmann M.D.   On: 03/19/2024 02:33    Assessment/Plan: Postop day #8: The patient slowly recovering from surgery.  Renal insufficiency: His labs are improving.  The Foley still in.  Anemia: Mild, asymptomatic  I appreciate Dr. Bernardine Bridegroom excellent care of this patient.   LOS: 1 day     Tony Campos 03/20/2024, 5:35 PM     Patient ID: Tony Campos, male   DOB: 10-24-1966, 58 y.o.   MRN: [id number]

## 2024-03-20 NOTE — Progress Notes (Signed)
 Physical Therapy Treatment Patient Details Name: Tony Campos MRN: [id number] DOB: 09-15-1966 Today's Date: 03/20/2024   History of Present Illness Patient is 58 y.o. male s/p L4-5 discectomy on 03/12/24 due to spondylolisthesis and recurrent herniated disc. PMH significant for AAA (without rupture), GERD, anxiety, depression, bipolar disorder, HLD, HTN, hypothyroidism, meningitis, PAD, Sleep apnea, R knee surgery x3, L knee surgery x1, appendectomy, cholecystectomy.    PT Comments  Pt greeted seated in recliner chair, pleasant and agreeable to PT session. He is making steady progress towards his acute PT goals, demonstrated by decreased physical assistance required with functional mobility. Pt completed OOB mobility using RW with CGA. He increased gait distance, ambulating ~268ft. Pt engaged in standing marches inside RW to simulate stair climbing. Reviewed back precautions and log roll technique. Will continue to follow acutely and advance appropriately.      If plan is discharge home, recommend the following: Assist for transportation;Help with stairs or ramp for entrance;A little help with bathing/dressing/bathroom;Assistance with cooking/housework;A little help with walking and/or transfers   Can travel by private vehicle        Equipment Recommendations  BSC/3in1    Recommendations for Other Services       Precautions / Restrictions Precautions Precautions: Fall;Back Recall of Precautions/Restrictions: Impaired Precaution/Restrictions Comments: Pt was able to recall 2/3 back precautions (bending and twisting) required prompting to remember lifting. Pt was able to verbalize log roll technique, but performed it incorrectly when returning to bed. Required Braces or Orthoses: Spinal Brace Spinal Brace: Lumbar corset;Applied in sitting position Restrictions Weight Bearing Restrictions Per Provider Order: No     Mobility  Bed Mobility Overal bed mobility: Needs Assistance Bed  Mobility: Sit to Sidelying, Rolling Rolling: Min assist, +2 for safety/equipment       Sit to sidelying: Min assist General bed mobility comments: Reviewed log roll technique with pt seated on L side of bed. Pt doffed lumbar corset prior to returning to bed. Cued pt to go down on his L shoulder. He required minA to bring BLE back into bed. Pt rolled onto his back with use of bedrails. Repositioned pt in the center of bed using bed pad.    Transfers Overall transfer level: Needs assistance Equipment used: Rolling walker (2 wheels) Transfers: Sit to/from Stand Sit to Stand: Contact guard assist           General transfer comment: Pt stood from recliner chair and BSC. He demonstrated proper hand placement using RW. Pt took increased time to power up into standing. Good eccentric control with sitting and reaching back to the surface he is going toward.    Ambulation/Gait Ambulation/Gait assistance: Contact guard assist Gait Distance (Feet): 200 Feet Assistive device: Rolling walker (2 wheels) Gait Pattern/deviations: Step-to pattern, Decreased step length - right, Decreased step length - left, Step-through pattern, Decreased stride length Gait velocity: decreased Gait velocity interpretation: <1.8 ft/sec, indicate of risk for recurrent falls   General Gait Details: Pt ambulated using RW. Educated pt to maintain all four points in contact with ground at all times. Pt initially took very short steps. VC for pt to increase step length, passing his opposite LE. Pt demonstrated a smooth reciprocal gait pattern. He maintained body inside RW at all times and navigated room/hallway well.   Stairs Stairs: Yes Stairs assistance: Contact guard assist Stair Management: No rails, Forwards, With walker Number of Stairs: 15 General stair comments: Pt performed standing marches inside RW with BUE support, no LOB.   Wheelchair Mobility  Tilt Bed    Modified Rankin (Stroke Patients  Only)       Balance Overall balance assessment: Needs assistance Sitting-balance support: No upper extremity supported, Feet supported Sitting balance-Leahy Scale: Fair     Standing balance support: Bilateral upper extremity supported, During functional activity, Reliant on assistive device for balance Standing balance-Leahy Scale: Poor Standing balance comment: Pt dependent on RW                            Communication Communication Communication: No apparent difficulties  Cognition Arousal: Alert Behavior During Therapy: WFL for tasks assessed/performed   PT - Cognitive impairments: Memory, Problem solving, Sequencing, Safety/Judgement                       PT - Cognition Comments: Pt appeared to have delayed processing. He would close his eyes ~15% of the session, which he reports is for him to think more clearly. Pt had difficulty abiding by back precautions and required multi-modal cueing to increase safety awareness and proper technique with mobility. Following commands: Impaired Following commands impaired: Follows one step commands with increased time    Cueing Cueing Techniques: Verbal cues  Exercises      General Comments General comments (skin integrity, edema, etc.): VSS on RA.      Pertinent Vitals/Pain Pain Assessment Pain Assessment: Faces Faces Pain Scale: Hurts little more Pain Location: back Pain Descriptors / Indicators: Discomfort, Tightness, Aching Pain Intervention(s): Monitored during session, Repositioned    Home Living                          Prior Function            PT Goals (current goals can now be found in the care plan section) Acute Rehab PT Goals Patient Stated Goal: Regain mobility and return home Progress towards PT goals: Progressing toward goals    Frequency    Min 5X/week      PT Plan      Co-evaluation              AM-PAC PT "6 Clicks" Mobility   Outcome Measure  Help  needed turning from your back to your side while in a flat bed without using bedrails?: A Little Help needed moving from lying on your back to sitting on the side of a flat bed without using bedrails?: A Little Help needed moving to and from a bed to a chair (including a wheelchair)?: A Little Help needed standing up from a chair using your arms (e.g., wheelchair or bedside chair)?: A Little Help needed to walk in hospital room?: A Little Help needed climbing 3-5 steps with a railing? : A Lot 6 Click Score: 17    End of Session Equipment Utilized During Treatment: Gait belt;Back brace Activity Tolerance: Patient tolerated treatment well Patient left: in bed;with call bell/phone within reach;with family/visitor present Nurse Communication: Mobility status PT Visit Diagnosis: Unsteadiness on feet (R26.81);Muscle weakness (generalized) (M62.81);Difficulty in walking, not elsewhere classified (R26.2)     Time: 0981-1914 PT Time Calculation (min) (ACUTE ONLY): 38 min  Charges:    $Gait Training: 23-37 mins $Therapeutic Activity: 8-22 mins PT General Charges $$ ACUTE PT VISIT: 1 Visit                     [name], PT, DPT Acute Rehabilitation Services Office: [phone] Secure Chat  Preferred  Leory Rands Nur Rabold 03/20/2024, 2:26 PM

## 2024-03-20 NOTE — Progress Notes (Signed)
 Speech Language Pathology Treatment: Dysphagia  Patient Details Name: MONTAY VANVOORHIS MRN: [id number] DOB: August 06, 1966 Today's Date: 03/20/2024 Time: 4696-2952 SLP Time Calculation (min) (ACUTE ONLY): 8 min  Assessment / Plan / Recommendation Clinical Impression  No s/s of dysphagia or aspiration were observed with trials of regular solids and thin liquids. He completed a 3 oz water test without signs clinically indicative of aspiration. Provided education regarding self-monitoring for signs of dysphagia and pursuing instrumental testing as indicated. Continue current diet without ongoing SLP f/u. Signing off acutely.    HPI HPI: JERMALE CRASS is a 58 yo male presenting to ED 4/16 with spondylolisthesis and recurrent herniated disc s/p L4-5 discectomy. Course complicated by hypoxia, weakness, and AKI. CXR shows bilateral reticular interstitial infiltrates. CTA Chest pending for concern of PE. PMH includes abdominal aortic aneurysm without ruptured last imaged 12/2023, morbid obesity, sleep apnea, GERD, anemia, bipolar disorder/depression, essential HTN, hypothyroidism, PAD, SIADH, current tobacco use, hypogonadism, cholecystectomy      SLP Plan  All goals met      Recommendations for follow up therapy are one component of a multi-disciplinary discharge planning process, led by the attending physician.  Recommendations may be updated based on patient status, additional functional criteria and insurance authorization.    Recommendations  Diet recommendations: Regular;Thin liquid Liquids provided via: Cup;Straw Medication Administration: Whole meds with liquid Supervision: Patient able to self feed Compensations: Minimize environmental distractions;Slow rate;Small sips/bites Postural Changes and/or Swallow Maneuvers: Seated upright 90 degrees                  Oral care BID   None Dysphagia, unspecified (R13.10)     All goals met     Amil Kale, M.A., CCC-SLP Speech  Language Pathology, Acute Rehabilitation Services  Secure Chat preferred [phone]   03/20/2024, 2:53 PM

## 2024-03-21 DIAGNOSIS — J9601 Acute respiratory failure with hypoxia: Secondary | ICD-10-CM | POA: Diagnosis not present

## 2024-03-21 DIAGNOSIS — M4316 Spondylolisthesis, lumbar region: Secondary | ICD-10-CM | POA: Diagnosis not present

## 2024-03-21 DIAGNOSIS — G9341 Metabolic encephalopathy: Secondary | ICD-10-CM | POA: Diagnosis not present

## 2024-03-21 DIAGNOSIS — J9602 Acute respiratory failure with hypercapnia: Secondary | ICD-10-CM | POA: Diagnosis not present

## 2024-03-21 LAB — RENAL FUNCTION PANEL
Albumin: 2.4 g/dL — ABNORMAL LOW (ref 3.5–5.0)
Anion gap: 11 (ref 5–15)
BUN: 27 mg/dL — ABNORMAL HIGH (ref 6–20)
CO2: 26 mmol/L (ref 22–32)
Calcium: 8.6 mg/dL — ABNORMAL LOW (ref 8.9–10.3)
Chloride: 106 mmol/L (ref 98–111)
Creatinine, Ser: 1.83 mg/dL — ABNORMAL HIGH (ref 0.61–1.24)
GFR, Estimated: 43 mL/min — ABNORMAL LOW (ref >60)
Glucose, Bld: 100 mg/dL — ABNORMAL HIGH (ref 70–99)
Phosphorus: 2.9 mg/dL (ref 2.5–4.6)
Potassium: 3.6 mmol/L (ref 3.5–5.1)
Sodium: 143 mmol/L (ref 135–145)

## 2024-03-21 LAB — CBC
HCT: 26.3 % — ABNORMAL LOW (ref 39.0–52.0)
Hemoglobin: 8.4 g/dL — ABNORMAL LOW (ref 13.0–17.0)
MCH: 28.8 pg (ref 26.0–34.0)
MCHC: 31.9 g/dL (ref 30.0–36.0)
MCV: 90.1 fL (ref 80.0–100.0)
Platelets: 269 10*3/uL (ref 150–400)
RBC: 2.92 MIL/uL — ABNORMAL LOW (ref 4.22–5.81)
RDW: 15.7 % — ABNORMAL HIGH (ref 11.5–15.5)
WBC: 6.7 10*3/uL (ref 4.0–10.5)
nRBC: 0 % (ref 0.0–0.2)

## 2024-03-21 LAB — MAGNESIUM: Magnesium: 2.4 mg/dL (ref 1.7–2.4)

## 2024-03-21 LAB — CK: Total CK: 809 U/L — ABNORMAL HIGH (ref 49–397)

## 2024-03-21 MED ORDER — TAMSULOSIN HCL 0.4 MG PO CAPS: 0.4000 mg | ORAL_CAPSULE | Freq: Every day | ORAL | Status: DC

## 2024-03-21 MED ORDER — AMLODIPINE BESYLATE 10 MG PO TABS
10.0000 mg | ORAL_TABLET | Freq: Every day | ORAL | Status: DC
  Administered 2024-03-21 – 2024-03-22 (×3): 10 mg via ORAL
  Filled 2024-03-21 (×2): qty 1

## 2024-03-21 MED ORDER — HYDRALAZINE HCL 25 MG PO TABS
25.0000 mg | ORAL_TABLET | Freq: Four times a day (QID) | ORAL | Status: DC | PRN
  Administered 2024-03-22: 25 mg via ORAL
  Filled 2024-03-21: qty 1

## 2024-03-21 MED ORDER — CARVEDILOL 6.25 MG PO TABS
6.2500 mg | ORAL_TABLET | Freq: Two times a day (BID) | ORAL | Status: DC
  Administered 2024-03-21 – 2024-03-22 (×3): 6.25 mg via ORAL
  Filled 2024-03-21 (×3): qty 1

## 2024-03-21 NOTE — Progress Notes (Signed)
 Physical Therapy Treatment Patient Details Name: Tony Campos MRN: [id number] DOB: [birth year] Today's Date: 03/21/2024   History of Present Illness Patient is 58 y.o. male s/p L4-5 discectomy on 03/12/24 due to spondylolisthesis and recurrent herniated disc. PMH significant for AAA (without rupture), GERD, anxiety, depression, bipolar disorder, HLD, HTN, hypothyroidism, meningitis, PAD, Sleep apnea, R knee surgery x3, L knee surgery x1, appendectomy, cholecystectomy.    PT Comments  Pt is making steady progress towards his acute PT goals, demonstrated by decreased physical assistance needed. He performed transfers and gait with supervision using RW. He increased gait distance, ambulating ~357ft. Educated pt on stair training based on his home set-up and discussed positioning for his wife to support him when entering/exiting. He ascended/descended 4 steps with HHA and CGA. Pt was able to state 3/3 back precautions and abide by his precautions throughout session.    If plan is discharge home, recommend the following: Assist for transportation;Help with stairs or ramp for entrance;A little help with bathing/dressing/bathroom;Assistance with cooking/housework;A little help with walking and/or transfers   Can travel by private vehicle        Equipment Recommendations       Recommendations for Other Services       Precautions / Restrictions Precautions Precautions: Fall;Back Recall of Precautions/Restrictions: Intact Precaution/Restrictions Comments: Pt was able to recall 3/3 back precautions. Required Braces or Orthoses: Spinal Brace Spinal Brace: Lumbar corset;Applied in sitting position Restrictions Weight Bearing Restrictions Per Provider Order: No     Mobility  Bed Mobility               General bed mobility comments: Not assessed. Pt greeted seated in recliner chair and returned there at end of session.    Transfers Overall transfer level: Needs assistance Equipment  used: Rolling walker (2 wheels) Transfers: Sit to/from Stand Sit to Stand: Supervision           General transfer comment: Pt stood from recliner chair demonstrating proper hand placement using RW and powering up into stand without physical assist. Good eccentric control with sitting, reaching back for the armrest.    Ambulation/Gait Ambulation/Gait assistance: Supervision Gait Distance (Feet): 300 Feet Assistive device: Rolling walker (2 wheels) Gait Pattern/deviations: Step-through pattern, Decreased stride length Gait velocity: decreased Gait velocity interpretation: <1.8 ft/sec, indicate of risk for recurrent falls   General Gait Details: Pt ambulated with a reciprocal gait pattern, even weight shift, and good foot clearence. He maintained an upright posture with body contained inside RW at all times. Pt required intermittent VC to help navigate obstacles within hallway/room.   Stairs Stairs: Yes Stairs assistance: Contact guard assist Stair Management: One rail Right, No rails, Forwards, Step to pattern Number of Stairs: 4 (x2) General stair comments: Educated pt on stair training based on his home set-up. Initially utilized railings to increase pt's confidence with completing the task. He ascended/descended with RLE. Advanced to no rails and 1 HHA on LUE. Discussed how his family should be positioned to support him with entering/exiting his home.   Wheelchair Mobility     Tilt Bed    Modified Rankin (Stroke Patients Only)       Balance Overall balance assessment: Needs assistance Sitting-balance support: No upper extremity supported, Feet supported Sitting balance-Leahy Scale: Fair Sitting balance - Comments: Pt sat in recliner chair and was able to scoot fwd/bkwd with BUE support on arm rests   Standing balance support: Bilateral upper extremity supported, During functional activity, Reliant on assistive device for balance Standing  balance-Leahy Scale:  Poor Standing balance comment: Pt dependent on RW                            Communication Communication Communication: No apparent difficulties  Cognition Arousal: Alert Behavior During Therapy: WFL for tasks assessed/performed   PT - Cognitive impairments: Problem solving, Safety/Judgement                         Following commands: Intact      Cueing Cueing Techniques: Verbal cues  Exercises      General Comments General comments (skin integrity, edema, etc.): VSS on RA      Pertinent Vitals/Pain Pain Assessment Pain Assessment: 0-10 Pain Score: 6  Pain Location: Bottom and posterior RLE Pain Descriptors / Indicators: Discomfort, Sore, Sharp, Shooting Pain Intervention(s): Monitored during session, Repositioned    Home Living                          Prior Function            PT Goals (current goals can now be found in the care plan section) Acute Rehab PT Goals Patient Stated Goal: Return Home and move easier Progress towards PT goals: Progressing toward goals    Frequency    Min 5X/week      PT Plan      Co-evaluation              AM-PAC PT "6 Clicks" Mobility   Outcome Measure  Help needed turning from your back to your side while in a flat bed without using bedrails?: A Little Help needed moving from lying on your back to sitting on the side of a flat bed without using bedrails?: A Little Help needed moving to and from a bed to a chair (including a wheelchair)?: A Little Help needed standing up from a chair using your arms (e.g., wheelchair or bedside chair)?: A Little Help needed to walk in hospital room?: A Little Help needed climbing 3-5 steps with a railing? : A Little 6 Click Score: 18    End of Session Equipment Utilized During Treatment: Gait belt;Back brace Activity Tolerance: Patient tolerated treatment well Patient left: in chair;with call bell/phone within reach Nurse Communication: Mobility  status PT Visit Diagnosis: Unsteadiness on feet (R26.81);Muscle weakness (generalized) (M62.81);Difficulty in walking, not elsewhere classified (R26.2)     Time: 1610-9604 PT Time Calculation (min) (ACUTE ONLY): 20 min  Charges:    $Gait Training: 8-22 mins PT General Charges $$ ACUTE PT VISIT: 1 Visit                     [name], PT, DPT Acute Rehabilitation Services Office: [phone] Secure Chat Preferred  [name] 03/21/2024, 2:17 PM

## 2024-03-21 NOTE — Progress Notes (Signed)
 Subjective: The patient is alert and pleasant.  He is talking to his wife on his cell phone.  He has no complaints.  Objective: Vital signs in last 24 hours: Temp:  [97.9 F (36.6 C)-98.6 F (37 C)] 98.4 F (36.9 C) (04/25 0538) Pulse Rate:  [82-92] 84 (04/24 1520) Resp:  [16] 16 (04/25 0538) BP: (112-184)/(57-93) 184/93 (04/25 0538) SpO2:  [93 %-98 %] 95 % (04/25 0538) Weight:  [122.4 kg] 122.4 kg (04/25 0538) Estimated body mass index is 36.61 kg/m as calculated from the following:   Height as of this encounter: 6' (1.829 m).   Weight as of this encounter: 122.4 kg.   Intake/Output from previous day: 04/24 0701 - 04/25 0700 In: 240 [P.O.:240] Out: 2130 [Urine:2130] Intake/Output this shift: Total I/O In: -  Out: 780 [Urine:780]  Physical exam the patient is alert and pleasant.  He looks well.  His lower extremity strength is normal.  Lab Results: Recent Labs    03/20/24 0235 03/21/24 0318  WBC 5.7 6.7  HGB 8.3* 8.4*  HCT 25.7* 26.3*  PLT 200 269   BMET Recent Labs    03/20/24 0822 03/21/24 0318  NA 141 143  K 3.5 3.6  CL 101 106  CO2 26 26  GLUCOSE 121* 100*  BUN 37* 27*  CREATININE 2.20* 1.83*  CALCIUM  8.8* 8.6*    Studies/Results: NM Pulmonary Perfusion Result Date: 03/19/2024 CLINICAL DATA:  Pulmonary embolism suspected.   High probability EXAM: NUCLEAR MEDICINE PERFUSION LUNG SCAN TECHNIQUE: Perfusion images were obtained in multiple projections after intravenous injection of radiopharmaceutical. RADIOPHARMACEUTICALS:  4.0 mCi Tc-24m MAA COMPARISON:  None Available. FINDINGS: No wedge-shaped peripheral perfusion defect within LEFT or RIGHT lung to suggest acute pulmonary embolism. Normal perfusion pattern. IMPRESSION: No evidence acute pulmonary embolism. Electronically Signed   By: Deboraha Fallow M.D.   On: 03/19/2024 16:20   ECHOCARDIOGRAM COMPLETE Result Date: 03/19/2024    ECHOCARDIOGRAM REPORT   Patient Name:   Tony Campos Burek Date of Exam:  03/19/2024 Medical Rec #:  [id number]       Height:       72.0 in Accession #:    [id number]      Weight:       264.8 lb Date of Birth:  Mar 29, 1966       BSA:          2.400 m Patient Age:    58 years        BP:           115/84 mmHg Patient Gender: M               HR:           97 bpm. Exam Location:  Inpatient Procedure: 2D Echo, Color Doppler and Cardiac Doppler (Both Spectral and Color            Flow Doppler were utilized during procedure). Indications:    Pulmonary Embolus  History:        Patient has no prior history of Echocardiogram examinations.                 AAA; Risk Factors:Dyslipidemia and Hypertension.  Sonographer:    Hersey Lorenzo RDCS Referring Phys: [phone] EKTA V PATEL IMPRESSIONS  1. Left ventricular ejection fraction, by estimation, is 60 to 65%. The left ventricle has normal function. Left ventricular endocardial border not optimally defined to evaluate regional wall motion. There is moderate concentric left ventricular hypertrophy. Left ventricular diastolic parameters were  normal.  2. Right ventricular systolic function is normal. The right ventricular size is normal. Tricuspid regurgitation signal is inadequate for assessing PA pressure.  3. The mitral valve is normal in structure. No evidence of mitral valve regurgitation. No evidence of mitral stenosis.  4. The aortic valve was not well visualized. Aortic valve regurgitation is not visualized. Aortic valve sclerosis/calcification is present, without any evidence of aortic stenosis.  5. The inferior vena cava is normal in size with greater than 50% respiratory variability, suggesting right atrial pressure of 3 mmHg. FINDINGS  Left Ventricle: Left ventricular ejection fraction, by estimation, is 60 to 65%. The left ventricle has normal function. Left ventricular endocardial border not optimally defined to evaluate regional wall motion. The left ventricular internal cavity size was normal in size. There is moderate concentric left ventricular  hypertrophy. Left ventricular diastolic parameters were normal. Normal left ventricular filling pressure. Right Ventricle: The right ventricular size is normal. No increase in right ventricular wall thickness. Right ventricular systolic function is normal. Tricuspid regurgitation signal is inadequate for assessing PA pressure. Left Atrium: Left atrial size was normal in size. Right Atrium: Right atrial size was normal in size. Pericardium: There is no evidence of pericardial effusion. Mitral Valve: The mitral valve is normal in structure. No evidence of mitral valve regurgitation. No evidence of mitral valve stenosis. Tricuspid Valve: The tricuspid valve is normal in structure. Tricuspid valve regurgitation is not demonstrated. No evidence of tricuspid stenosis. Aortic Valve: The aortic valve was not well visualized. Aortic valve regurgitation is not visualized. Aortic valve sclerosis/calcification is present, without any evidence of aortic stenosis. Pulmonic Valve: The pulmonic valve was normal in structure. Pulmonic valve regurgitation is trivial. No evidence of pulmonic stenosis. Aorta: The aortic root is normal in size and structure. Venous: The inferior vena cava is normal in size with greater than 50% respiratory variability, suggesting right atrial pressure of 3 mmHg. IAS/Shunts: No atrial level shunt detected by color flow Doppler.  LEFT VENTRICLE PLAX 2D LVIDd:         5.30 cm   Diastology LVIDs:         3.60 cm   LV e' medial:    8.16 cm/s LV PW:         1.40 cm   LV E/e' medial:  10.9 LV IVS:        1.40 cm   LV e' lateral:   10.30 cm/s LVOT diam:     2.50 cm   LV E/e' lateral: 8.6 LV SV:         104 LV SV Index:   43 LVOT Area:     4.91 cm  RIGHT VENTRICLE         IVC TAPSE (M-mode): 2.3 cm  IVC diam: 2.00 cm LEFT ATRIUM             Index        RIGHT ATRIUM           Index LA diam:        3.70 cm 1.54 cm/m   RA Area:     19.00 cm LA Vol (A2C):   51.2 ml 21.33 ml/m  RA Volume:   52.30 ml  21.79 ml/m  LA Vol (A4C):   47.8 ml 19.91 ml/m LA Biplane Vol: 50.2 ml 20.91 ml/m  AORTIC VALVE LVOT Vmax:   133.00 cm/s LVOT Vmean:  76.700 cm/s LVOT VTI:    0.212 m  AORTA Ao Root diam: 3.20 cm Ao  Asc diam:  3.30 cm MITRAL VALVE MV Area (PHT): 4.60 cm    SHUNTS MV Decel Time: 165 msec    Systemic VTI:  0.21 m MV E velocity: 88.80 cm/s  Systemic Diam: 2.50 cm MV A velocity: 89.90 cm/s MV E/A ratio:  0.99 Gaylyn Keas MD Electronically signed by Gaylyn Keas MD Signature Date/Time: 03/19/2024/11:09:26 AM    Final    US  RENAL Result Date: 03/19/2024 CLINICAL DATA:  [id number] AKI (acute kidney injury) (HCC) [id number] EXAM: RENAL / URINARY TRACT ULTRASOUND COMPLETE COMPARISON:  January 05, 2020, February 13, 2011 FINDINGS: Right Kidney: Renal measurements: 14.1 x 6.5 x 6.3 cm = volume: 302 mL. Normal echogenicity. Interpolar region cyst measuring 4.2 x 4.1 x 3.8 cm. No hydronephrosis or nephrolithiasis. Left Kidney: Renal measurements: 11.5 x 6.8 x 3.9 cm = volume: 160 mL. 1.6 x 1.8 x 1.8 cm cyst in the lower pole. Isoechoic structure measured by the sonographer in the left kidney measuring 2.1 x 2.5 x 1.5 cm. No mass. No hydronephrosis or nephrolithiasis. Bladder: Completely decompressed with a urinary catheter in place. Other: None. IMPRESSION: 1.  No hydronephrosis or nephrolithiasis. 2. Isoechoic structure in the left kidney (2.1 x 2.5 x 1.5 cm), measured by the sonographer, may represent a region of protuberant renal cortex or underlying renal lesion. A nonemergent CT or MRI of the abdomen with IV contrast should be considered for further characterization. Electronically Signed   By: Rance Burrows M.D.   On: 03/19/2024 10:48    Assessment/Plan: Post lumbar fusion: The patient's creatinine is improving.  LOS: 2 days     [name] 03/21/2024, 6:50 AM     Patient ID: Tony Campos, male   DOB: 1966/06/23, 58 y.o.   MRN: [id number]

## 2024-03-21 NOTE — Progress Notes (Signed)
 PROGRESS NOTE  Tony Campos:[id number] DOB: Nov 24, 1966   PCP: Austine Lefort, MD  Patient is from: Home.  DOA: 03/12/2024 LOS: 2  Chief complaints No chief complaint on file.    Brief Narrative / Interim history: 58 year old M with PMH of AAA without rupture, OSA on inspire, PAD s/p right SFA angioplasty/stent in 12/2025, HTN, hypothyroidism, hypogonadism, anxiety, depression, bipolar disorder, tobacco use disorder, GERD, morbid obesity and lumbar spondylolithiasis.  Patient was admitted by neurosurgery for lumbar spondylolisthesis and underwent surgery.  Postoperative course complicated by acute respiratory failure, AKI and encephalopathy for which she was transferred to hospital service on 03/18/2024.  On chart review, patient hypoxic requiring up to 6 L.  ABG with mild chronic respiratory acidosis.  Patient was on multiple sedating medication for pain and anxiety.  Cr up to 2.56 (baseline 0.8-1.0).  Troponin elevated to 2600 x 3.  CK elevated to 2600.  Patient without chest pain or ischemic finding on EKG.  CXR with bilateral reticular interstitial infiltrates, edema versus pneumonia.  B12 low at 160.  Echocardiogram and VQ scan ordered.  Started on IV heparin  pending VQ scan.  Mental status improved after adjusting sedating medications.  Respiratory failure resolving.  AKI improved with IV fluid.  TTE and VQ scan without significant finding.  Heparin  discontinued.  Evaluated by cardiology and cleared.    Subjective: Seen and examined earlier this morning.  No major events overnight of this morning.  No complaints.  Eager to go home.  On room air.  Elevated blood pressures.  Objective: Vitals:   03/20/24 1520 03/20/24 2031 03/21/24 0126 03/21/24 0538  BP: 135/65 (!) 149/91 (!) 175/79 (!) 184/93  Pulse: 84     Resp: 16 16 16 16   Temp: 97.9 F (36.6 C) 98.2 F (36.8 C) 98.6 F (37 C) 98.4 F (36.9 C)  TempSrc: Oral Oral Oral Oral  SpO2: 98% 94% 97% 95%  Weight:    122.4  kg  Height:        Examination:  GENERAL: No apparent distress.  Nontoxic. HEENT: MMM.  Vision and hearing grossly intact.  NECK: Supple.  No apparent JVD.  RESP:  No IWOB.  Fair aeration bilaterally.  Excellent effort on incentive spirometry. CVS:  RRR. Heart sounds normal.  ABD/GI/GU: BS+. Abd soft, NTND.  Foley catheter in place. MSK/EXT:  Moves extremities. No apparent deformity. No edema.  SKIN: Surgical wound DCI. NEURO: Awake.  Oriented x 4.  No apparent focal neuro deficit. PSYCH: Calm. Normal affect.   Consultants:  Neurosurgery admitted patient  Procedures: 4/16-Bilateral L4-5 laminotomy/foraminotomies/medial facetectomy to decompress the bilateral L4 and L5 nerve roots by Dr. Larrie Po  Microbiology summarized: 4/22-blood cultures NGTD 4/22-urine culture pending  Assessment and plan: L4-5 spondylolisthesis with lumbar radiculopathy, neurogenic claudication -S/p Bilateral L4-5 laminotomy/foraminotomies/medial facetectomy to decompress on 4/16 -Adjusted pain medication due to encephalopathy and respiratory failure -Encouraged smoking cessation -Subcu heparin  for VTE prophylaxis -OOB/PT/OT  Acute metabolic encephalopathy: This is likely iatrogenic from multiple sedating medications.  Patient was on MS Contin , clonazepam , Flexeril , Lyrica  and IV fentanyl . MRI brain without acute finding.  B12 low at 160.  VBG with mild compensated respiratory acidosis.  Encephalopathy resolved after adjusting meds.  Oriented x 4.   No focal neurodeficit on exam. - Change MS Contin  to OxyContin  given renal failure.  Decreased dose. - Discontinued Flexeril  - Decreased Lyrica   and Klonopin . - Reorientation and delirium precaution. - Asked wife to activate his Aspire when he falls asleep.  -  Vitamin B12 injection followed by p.o.  Acute respiratory failure with hypoxia and hypercapnia: This is likely from multiple sedating medication underlying OSA.  Patient was not activating his aspire.   VBG with mild compensated respiratory acidosis.  No significant event on CXR.  Excellent effort on incentive parameter.  VQ scan negative for PE.  Appears euvolemic on exam.  BNP 228.  TTE without significant finding.  Respiratory failure resolved. -Adjusted pain meds as above -Encourage incentive spirometry, OOB, PT/OT -Encouraged to activate his Aspire before he falls asleep. -Ambulatory saturation  Acute kidney injury: Unclear etiology.  No contrast administration.  Losartan  discontinued on 4/20.  Has urinary retention with removal of 800 cc urine on 4/22.  Renal US  with no hydronephrosis of nephrolithiasis but 2.1 x 2.5 x 1.5 cm isoechoic structure of left kidney.  Improving. Recent Labs    06/18/23 1628 09/28/23 1554 03/06/24 1330 03/18/24 0848 03/18/24 2116 03/19/24 0248 03/20/24 0822 03/21/24 0318  BUN 11 6* 8 38* 44* 44* 37* 27*  CREATININE 0.85 0.83 1.07 2.56* 2.87* 2.94* 2.20* 1.83*  - Monitor off IV fluid today. - Voiding trial - Avoid nephrotoxic meds - Strict intake and output  Urinary retention: Patient had 800 cc urine removed with Foley catheter on 4/22.  This could be due to constipation.  He also had back surgery on 4/16.   Renal US  as above. -Discontinue Foley catheter -Start Flomax  and bladder scan  Acute blood loss anemia: Reported EBL of 200 cc.  No obvious bleeding.  H&H currently stable.  No nutritional deficiency on anemia panel Recent Labs    06/18/23 1628 03/04/24 1218 03/18/24 1458 03/19/24 0248 03/20/24 0235 03/21/24 0318  HGB 13.4 12.4* 8.4* 9.0* 8.3* 8.4*  - Continue monitoring  Elevated troponin: High-sensitivity troponin 2626, 2700, 2800.  Could be patient of demand ischemia due to respiratory failure and delayed clearance due to renal failure.  Patient without chest pain or acute ischemic finding on EKG.  TTE reassuring but not able to assess RWMA.  No additional input from cardiology.  Uncontrolled hypertension: BP 180/93.  Home losartan  on  hold due to AKI.  He was also on low-dose metoprolol .  He has not been activating his aspire for sleep apnea - Start amlodipine  10 mg daily - Start Coreg  6.25 mg twice daily - P.o. hydralazine as needed - Discontinue IV fluid - Advised to activate his aspire  Elevated CK: CK trended from 2600-3500> 2300>> 800 - Continue holding statin - Discontinue IV fluid  Vitamin B12 deficiency: B12 160 - Vitamin B12 injection 1000 mcg daily for 3 days followed by p.o.  Left renal lesion: Renal ultrasound showed 2.1 x 2.5 x 1.5 cm isoechoic structure of left kidney.  Nonemergent CT or MRI of abdomen with IV contrast recommended for further catheterization. -Will wait until AKI resolves.  This can be done outpatient. -Discussed with patient and patient's wife  AAA/history of PAD -Outpatient follow-up  Anxiety, depression and bipolar disorder - Decreased Klonopin  due to encephalopathy - Continue Cymbalta , Lamictal , Rexulti   Tobacco use disorder - Encouraged smoking cessation  OSA - Advised wife to activate aspire  Constipation: Wife reported last bowel movement was before admission.  Resolved. -Continue bowel regimen  Hypothyroidism: TSH slightly low with slightly elevated free T4. -Continue home Synthroid  -Recheck thyroid  panel in 4 to 6 weeks  Class II obesity Body mass index is 36.61 kg/m. - Encourage lifestyle change to lose weight.           DVT  prophylaxis:  heparin  injection 5,000 Units Start: 03/20/24 0600 SCD's Start: 03/12/24 1451  Code Status: Full code Family Communication: None at bedside. Level of care: Med-Surg Status is: Inpatient Remains inpatient appropriate because:  AKI and uncontrolled hypertension   Final disposition: Home on 4/26   55 minutes with more than 50% spent in reviewing records, counseling patient/family and coordinating care.   Sch Meds:  Scheduled Meds:  acetaminophen   1,000 mg Oral Q8H   amLODipine   10 mg Oral Daily    brexpiprazole   2 mg Oral Daily   carvedilol   6.25 mg Oral BID WC   Chlorhexidine  Gluconate Cloth  6 each Topical Q0600   clonazePAM   0.5 mg Oral BID   cyanocobalamin   1,000 mcg Intramuscular Daily   Followed by   Cecily Cohen ON 03/22/2024] vitamin B-12  1,000 mcg Oral Daily   DULoxetine   60 mg Oral Daily   ferrous sulfate   325 mg Oral QHS   heparin  injection (subcutaneous)  5,000 Units Subcutaneous Q8H   lamoTRIgine   200 mg Oral BID   levothyroxine   125 mcg Oral Q0600   nicotine   21 mg Transdermal Daily   oxyCODONE   10 mg Oral Q12H   pantoprazole   40 mg Oral Daily   pregabalin   50 mg Oral BID   sodium chloride  flush  3 mL Intravenous Q12H   tamsulosin   0.4 mg Oral QPC supper   valACYclovir   500 mg Oral Daily   Continuous Infusions:   PRN Meds:.bisacodyl , fentaNYL  (SUBLIMAZE ) injection, hydrALAZINE, menthol -cetylpyridinium **OR** phenol, ondansetron  **OR** ondansetron  (ZOFRAN ) IV, [COMPLETED] polyethylene glycol **FOLLOWED BY** polyethylene glycol, sodium chloride  flush  Antimicrobials: Anti-infectives (From admission, onward)    Start     Dose/Rate Route Frequency Ordered Stop   03/13/24 1000  valACYclovir  (VALTREX ) tablet 500 mg        500 mg Oral Daily 03/12/24 1450     03/12/24 1700  ceFAZolin  (ANCEF ) IVPB 3g/150 mL premix        3 g 300 mL/hr over 30 Minutes Intravenous Every 8 hours 03/12/24 1450 03/12/24 2333   03/12/24 0800  ceFAZolin  (ANCEF ) IVPB 3g/150 mL premix        3 g 300 mL/hr over 30 Minutes Intravenous On call to O.R. 03/12/24 3244 03/12/24 0900        I have personally reviewed the following labs and images: CBC: Recent Labs  Lab 03/18/24 1458 03/19/24 0248 03/20/24 0235 03/21/24 0318  WBC 8.4 7.8 5.7 6.7  NEUTROABS 6.3 5.2  --   --   HGB 8.4* 9.0* 8.3* 8.4*  HCT 25.0* 27.7* 25.7* 26.3*  MCV 89.6 90.5 90.5 90.1  PLT 204 208 200 269   BMP &GFR Recent Labs  Lab 03/18/24 0848 03/18/24 2116 03/19/24 0248 03/20/24 0822 03/21/24 0318  NA 138 136  138 141 143  K 3.7 3.9 5.2* 3.5 3.6  CL 96* 98 98 101 106  CO2 25 26 28 26 26   GLUCOSE 98 109* 104* 121* 100*  BUN 38* 44* 44* 37* 27*  CREATININE 2.56* 2.87* 2.94* 2.20* 1.83*  CALCIUM  9.0 8.1* 8.5* 8.8* 8.6*  MG  --   --   --   --  2.4  PHOS  --   --  5.6*  --  2.9   Estimated Creatinine Clearance: 61.6 mL/min (A) (by C-G formula based on SCr of 1.83 mg/dL (H)). Liver & Pancreas: Recent Labs  Lab 03/18/24 1458 03/19/24 0248 03/20/24 0822 03/21/24 0318  AST 53* 56* 53*  --  ALT 12 13 13   --   ALKPHOS 54 57 61  --   BILITOT 1.1 1.0 0.9  --   PROT 5.7* 5.7* 5.9*  --   ALBUMIN  2.5* 2.6* 2.6* 2.4*   No results for input(s): "LIPASE", "AMYLASE" in the last 168 hours. Recent Labs  Lab 03/18/24 1856  AMMONIA 37*   Diabetic: Recent Labs    03/18/24 1856  HGBA1C 5.0   Recent Labs  Lab 03/14/24 2256  GLUCAP 110*   Cardiac Enzymes: Recent Labs  Lab 03/18/24 1938 03/19/24 0832 03/20/24 0822 03/21/24 0318  CKTOTAL 2,590* 3,556* 2,282* 809*   No results for input(s): "PROBNP" in the last 8760 hours. Coagulation Profile: No results for input(s): "INR", "PROTIME" in the last 168 hours. Thyroid  Function Tests: Recent Labs    03/19/24 0831 03/19/24 1230  TSH 0.310*  --   FREET4  --  1.34*   Lipid Profile: Recent Labs    03/20/24 0235  CHOL 73  HDL 20*  LDLCALC 31  TRIG 409  CHOLHDL 3.7   Anemia Panel: Recent Labs    03/18/24 1458 03/20/24 0822 03/20/24 0823  VITAMINB12 160*  --   --   FOLATE 7.4  --   --   FERRITIN  --  570*  --   TIBC  --  227*  --   IRON  --  36*  --   RETICCTPCT  --   --  2.1   Urine analysis:    Component Value Date/Time   COLORURINE YELLOW 03/18/2024 1342   APPEARANCEUR HAZY (A) 03/18/2024 1342   LABSPEC 1.011 03/18/2024 1342   PHURINE 5.0 03/18/2024 1342   GLUCOSEU NEGATIVE 03/18/2024 1342   HGBUR SMALL (A) 03/18/2024 1342   BILIRUBINUR NEGATIVE 03/18/2024 1342   KETONESUR NEGATIVE 03/18/2024 1342   PROTEINUR 100  (A) 03/18/2024 1342   UROBILINOGEN 0.2 11/10/2013 1522   NITRITE NEGATIVE 03/18/2024 1342   LEUKOCYTESUR NEGATIVE 03/18/2024 1342   Sepsis Labs: Invalid input(s): "PROCALCITONIN", "LACTICIDVEN"  Microbiology: Recent Results (from the past 240 hours)  Culture, blood (Routine X 2) w Reflex to ID Panel     Status: None (Preliminary result)   Collection Time: 03/18/24  2:58 PM   Specimen: BLOOD RIGHT ARM  Result Value Ref Range Status   Specimen Description BLOOD RIGHT ARM  Final   Special Requests   Final    BOTTLES DRAWN AEROBIC AND ANAEROBIC Blood Culture results may not be optimal due to an inadequate volume of blood received in culture bottles   Culture   Final    NO GROWTH 3 DAYS Performed at [facility], [address], [place], [place] [address]    Report Status PENDING  Incomplete  Culture, blood (Routine X 2) w Reflex to ID Panel     Status: None (Preliminary result)   Collection Time: 03/18/24  2:59 PM   Specimen: BLOOD LEFT ARM  Result Value Ref Range Status   Specimen Description BLOOD LEFT ARM  Final   Special Requests   Final    BOTTLES DRAWN AEROBIC AND ANAEROBIC Blood Culture results may not be optimal due to an inadequate volume of blood received in culture bottles   Culture   Final    NO GROWTH 3 DAYS Performed at [facility], [address], [place], [place] [address]    Report Status PENDING  Incomplete  Urine Culture (for pregnant, neutropenic or urologic patients or patients with an indwelling urinary catheter)  Status: None   Collection Time: 03/18/24  5:00 PM   Specimen: Urine, Clean Catch  Result Value Ref Range Status   Specimen Description URINE, CLEAN CATCH  Final   Special Requests NONE  Final   Culture   Final    NO GROWTH Performed at [facility], [address] [place], [place] [address]    Report Status 03/19/2024 FINAL  Final    Radiology Studies: No results found.     Naomi Castrogiovanni T. Coleman Kalas [facility]  If  7PM-7AM, please contact night-coverage [email or web address] 03/21/2024, 11:10 AM

## 2024-03-22 ENCOUNTER — Other Ambulatory Visit (HOSPITAL_COMMUNITY): Payer: Self-pay

## 2024-03-22 DIAGNOSIS — I714 Abdominal aortic aneurysm, without rupture, unspecified: Secondary | ICD-10-CM

## 2024-03-22 DIAGNOSIS — G9341 Metabolic encephalopathy: Secondary | ICD-10-CM | POA: Diagnosis not present

## 2024-03-22 DIAGNOSIS — I739 Peripheral vascular disease, unspecified: Secondary | ICD-10-CM

## 2024-03-22 DIAGNOSIS — G4733 Obstructive sleep apnea (adult) (pediatric): Secondary | ICD-10-CM

## 2024-03-22 DIAGNOSIS — N179 Acute kidney failure, unspecified: Secondary | ICD-10-CM | POA: Diagnosis not present

## 2024-03-22 DIAGNOSIS — M4316 Spondylolisthesis, lumbar region: Secondary | ICD-10-CM | POA: Diagnosis not present

## 2024-03-22 DIAGNOSIS — J9601 Acute respiratory failure with hypoxia: Secondary | ICD-10-CM | POA: Diagnosis not present

## 2024-03-22 LAB — COMPREHENSIVE METABOLIC PANEL WITH GFR
ALT: 11 U/L (ref 0–44)
AST: 30 U/L (ref 15–41)
Albumin: 2.8 g/dL — ABNORMAL LOW (ref 3.5–5.0)
Alkaline Phosphatase: 59 U/L (ref 38–126)
Anion gap: 12 (ref 5–15)
BUN: 19 mg/dL (ref 6–20)
CO2: 26 mmol/L (ref 22–32)
Calcium: 9.2 mg/dL (ref 8.9–10.3)
Chloride: 104 mmol/L (ref 98–111)
Creatinine, Ser: 1.42 mg/dL — ABNORMAL HIGH (ref 0.61–1.24)
GFR, Estimated: 58 mL/min — ABNORMAL LOW (ref >60)
Glucose, Bld: 107 mg/dL — ABNORMAL HIGH (ref 70–99)
Potassium: 3.2 mmol/L — ABNORMAL LOW (ref 3.5–5.1)
Sodium: 142 mmol/L (ref 135–145)
Total Bilirubin: 0.6 mg/dL (ref 0.0–1.2)
Total Protein: 5.8 g/dL — ABNORMAL LOW (ref 6.5–8.1)

## 2024-03-22 LAB — MAGNESIUM: Magnesium: 2.2 mg/dL (ref 1.7–2.4)

## 2024-03-22 LAB — CK: Total CK: 508 U/L — ABNORMAL HIGH (ref 49–397)

## 2024-03-22 MED ORDER — ACETAMINOPHEN 500 MG PO TABS: ORAL_TABLET | ORAL | Status: AC

## 2024-03-22 MED ORDER — CLONAZEPAM 0.5 MG PO TABS
0.5000 mg | ORAL_TABLET | Freq: Two times a day (BID) | ORAL | 0 refills | Status: AC
Start: 2024-03-22 — End: ?
  Filled 2024-03-22: qty 60, 30d supply, fill #0

## 2024-03-22 MED ORDER — AMLODIPINE BESYLATE 10 MG PO TABS
10.0000 mg | ORAL_TABLET | Freq: Every day | ORAL | 1 refills | Status: DC
  Filled 2024-03-22: qty 90, 90d supply, fill #0

## 2024-03-22 MED ORDER — LOPERAMIDE HCL 2 MG PO CAPS
2.0000 mg | ORAL_CAPSULE | ORAL | Status: DC | PRN
  Administered 2024-03-22: 2 mg via ORAL
  Filled 2024-03-22: qty 1

## 2024-03-22 MED ORDER — SENNOSIDES-DOCUSATE SODIUM 8.6-50 MG PO TABS: 2.0000 | ORAL_TABLET | Freq: Two times a day (BID) | ORAL | Status: AC | PRN

## 2024-03-22 MED ORDER — TAMSULOSIN HCL 0.4 MG PO CAPS
0.4000 mg | ORAL_CAPSULE | Freq: Every day | ORAL | 0 refills | Status: AC
  Filled 2024-03-22: qty 30, 30d supply, fill #0

## 2024-03-22 MED ORDER — CARVEDILOL 6.25 MG PO TABS
6.2500 mg | ORAL_TABLET | Freq: Two times a day (BID) | ORAL | 0 refills | Status: DC
  Filled 2024-03-22: qty 180, 90d supply, fill #0

## 2024-03-22 MED ORDER — NICOTINE 21 MG/24HR TD PT24: 21.0000 mg | MEDICATED_PATCH | Freq: Every day | TRANSDERMAL | Status: AC

## 2024-03-22 MED ORDER — CYANOCOBALAMIN 1000 MCG PO TABS: 1000.0000 ug | ORAL_TABLET | Freq: Every day | ORAL | Status: AC

## 2024-03-22 NOTE — H&P (Deleted)
  NEUROSURGERY PROGRESS NOTE    no issues overnight, patient laying in bed with wife at bedside. he has no complaints today.   EXAM:  BP (!) 187/93   Pulse 83   Temp 97.9 F (36.6 C) (Oral)   Resp 20   Ht 6' (1.829 m)   Wt 122.6 kg   SpO2 96%   BMI 36.66 kg/m    awake, alert, oriented,  moving all extremities well.  wound is clean dry and intact.   IMPRESSION:  58 y.o. male  status post lumbar fusion with post-operative acute kidney injury, now resolved.   PLAN: - planning on discharge home today per primary team, follow up in your surgery office already scheduled in two weeks.     Augusto Blonder, MD [facility]

## 2024-03-22 NOTE — Progress Notes (Signed)
 Am meds given early pending discharge orders written at [phone].  Bp elevated this am prn hydralzine given per MD and xanax barcode would not scan but was witnessed by [employer] Nursing student

## 2024-03-22 NOTE — Plan of Care (Signed)

## 2024-03-22 NOTE — Discharge Summary (Signed)
 Physician Discharge Summary  Tony Campos ZOX:[id number] DOB: 1966/09/05 DOA: 03/12/2024  PCP: Austine Lefort, MD  Admit date: 03/12/2024 Discharge date: 03/22/24  Admitted From: Home Disposition: Home Recommendations for Outpatient Follow-up:  Outpatient follow-up with PCP and neurosurgery as below Reassess BP, CMP and CBC at follow-up and adjust meds as appropriate. Patient is at risk for polypharmacy on multiple sedating medications.  Discontinued baclofen  and decrease Klonopin .  Recommend further review outpatient. Patient with left renal lesion.  Needs outpatient CT or MRI with contrast when AKI resolves Consider referral to urology. Please follow up on the following pending results: None  Home Health: No need identified. Equipment/Devices: 3 in 1 commode and shower seat  Discharge Condition: Stable CODE STATUS: Full code  Follow-up Information     Garry Kansas, MD. Go on 04/22/2024.   Specialty: Neurosurgery Why: First post op appointment with x-rays is on 04/22/2024 at 1:45 PM. Contact information: [address] [place] [address] [phone]         [facility] Follow up.   Specialty: Home Health Services Contact information: [address] [place] [address] [phone]         Austine Lefort, MD Follow up.   Specialty: Family Medicine Contact information: [address] [place] [address] [phone]                 Hospital course 58 year old M with PMH of AAA without rupture, OSA on inspire, PAD s/p right SFA angioplasty/stent in 12/2025, HTN, hypothyroidism, hypogonadism, anxiety, depression, bipolar disorder, tobacco use disorder, GERD, morbid obesity and lumbar spondylolithiasis.  Patient was admitted by neurosurgery for lumbar spondylolisthesis and underwent surgery.  Postoperative course complicated by acute respiratory failure, AKI and encephalopathy for which she was  transferred to hospital service on 03/18/2024.   On chart review, patient hypoxic requiring up to 6 L.  ABG with mild chronic respiratory acidosis.  Patient was on multiple sedating medication for pain and anxiety.  Cr up to 2.56 (baseline 0.8-1.0).  Troponin elevated to 2600 x 3.  CK elevated to 2600.  Patient without chest pain or ischemic finding on EKG.  CXR with bilateral reticular interstitial infiltrates, edema versus pneumonia.  B12 low at 160.  Echocardiogram and VQ scan ordered.  Started on IV heparin  pending VQ scan.   Patient's mental status improved after adjusting sedating medications.  TTE and VQ scan without significant finding.  Heparin  discontinued.  No additional input by cardiology.  AKI continued to improve with IV fluid.   On the day of discharge, encephalopathy resolved.  AKI continued to improve off IV fluid.  Passed voiding trial.  He is discharged to follow-up with PCP and neurosurgery.  We have adjusted sedating medications on discharge by stopping baclofen  and decreasing Klonopin .  Recommend further adjustment as appropriate outpatient.  Patient's renal ultrasound showed left renal lesion.  He needs CT or MRI with contrast outpatient for further evaluation.  He may need outpatient referral to urology as well.  See individual problem list below for more.   Problems addressed during this hospitalization L4-5 spondylolisthesis with lumbar radiculopathy, neurogenic claudication -S/p Bilateral L4-5 laminotomy/foraminotomies/medial facetectomy to decompress on 4/16 -Continue home MS Contin  and Lyrica . -Was given prescription for Percocet by neurosurgery -Discontinued baclofen  due to risk for polypharmacy. -Bowel regimen -Per neurosurgery.   Acute metabolic encephalopathy: This is likely iatrogenic from multiple sedating medications.  Patient was on MS Contin , clonazepam , Flexeril , Lyrica  and  IV fentanyl . MRI brain without acute finding.  B12 low at 160.  VBG with mild  compensated respiratory acidosis.  Encephalopathy resolved after adjusting meds.  Oriented x 4.   No focal neurodeficit on exam. -Adjusted sedating medications.  Recommend further adjustment -Received vitamin B12 injection 1000 mcg daily for 3 days and discharged on p.o.   Acute respiratory failure with hypoxia and hypercapnia: This is likely from multiple sedating medication underlying OSA.  Patient was not activating his aspire.  VBG with mild compensated respiratory acidosis.  No significant event on CXR.  Excellent effort on incentive parameter.  VQ scan negative for PE.  Appears euvolemic on exam.  BNP 228.  TTE without significant finding.  Respiratory failure resolved.  Maintained appropriate saturation with ambulation on room air. -Encouraged to activate his Aspire before he falls asleep.   Acute kidney injury: Unclear etiology.  No contrast administration.  Losartan  discontinued on 4/20.  Has urinary retention with removal of 800 cc urine on 4/22.  Renal US  with no hydronephrosis of nephrolithiasis but 2.1 x 2.5 x 1.5 cm isoechoic structure of left kidney.  AKI resolving. Recent Labs    06/18/23 1628 09/28/23 1554 03/06/24 1330 03/18/24 0848 03/18/24 2116 03/19/24 0248 03/20/24 0822 03/21/24 0318 03/22/24 0251  BUN 11 6* 8 38* 44* 44* 37* 27* 19  CREATININE 0.85 0.83 1.07 2.56* 2.87* 2.94* 2.20* 1.83* 1.42*  - Advised to hold losartan  for a week - Recheck renal function at follow-up   Urinary retention: Patient had 800 cc urine removed with Foley catheter on 4/22.  This could be due to constipation.  He also had back surgery on 4/16.   Renal US  as above.  Passed voiding trial on 4/25. - Continue Flomax    Acute blood loss anemia: Reported EBL of 200 cc.  No obvious bleeding.  H&H currently stable.  No nutritional deficiency on anemia panel: H&H stable. Recent Labs    06/18/23 1628 03/04/24 1218 03/18/24 1458 03/19/24 0248 03/20/24 0235 03/21/24 0318  HGB 13.4 12.4* 8.4*  9.0* 8.3* 8.4*  - Recheck CBC in 1 week    Elevated troponin: High-sensitivity troponin 2626, 2700, 2800.  Could be patient of demand ischemia due to respiratory failure and delayed clearance due to renal failure.  Patient without chest pain or acute ischemic finding on EKG.  TTE reassuring but not able to assess RWMA.  No additional input from cardiology.   Uncontrolled hypertension: BP elevated but improved.  -Started amlodipine  10 mg daily and Coreg  6.25 mg twice daily -Hold home losartan  for about a week in the setting of AKI - Reassess BP and adjust meds as appropriate outpatient - Advised to activate his aspire   Elevated CK: CK trended from 2600-3500> 2300>> 500 -Continue holding statin   Vitamin B12 deficiency: B12 160 - Vitamin B12 injection 1000 mcg daily for 3 days followed by p.o.   Left renal lesion: Renal ultrasound showed 2.1 x 2.5 x 1.5 cm isoechoic structure of left kidney.  Nonemergent CT or MRI of abdomen with IV contrast recommended for further catheterization. -CT or MRI abdomen with IV contrast with AKI results. -Consider referral to urology    AAA/history of PAD -Outpatient follow-up   Anxiety, depression and bipolar disorder - Decreased Klonopin  due to encephalopathy - Continue Cymbalta , Lamictal , Rexulti    Tobacco use disorder - Encouraged smoking cessation   OSA - Advised wife to activate aspire   Constipation: Wife reported last bowel movement was before admission.  Resolved. -Continue bowel  regimen   Hypothyroidism: TSH slightly low with slightly elevated free T4. -Continue home Synthroid  -Recheck thyroid  panel in 4 to 6 weeks   Class II obesity Body mass index is 36.66 kg/m. - Encourage lifestyle change to lose weight           Time spent 35 minutes  Vital signs Vitals:   03/22/24 0624 03/22/24 0815 03/22/24 0855 03/22/24 0953  BP: (!) 156/84 (!) 187/93 (!) 164/85 (!) 159/86  Pulse: 83  75   Temp:      Resp:      Height:       Weight:      SpO2:      TempSrc:      BMI (Calculated):         Discharge exam  GENERAL: No apparent distress.  Nontoxic. HEENT: MMM.  Vision and hearing grossly intact.  NECK: Supple.  No apparent JVD.  RESP:  No IWOB.  Fair aeration bilaterally.  Excellent effort on incentive spirometry. CVS:  RRR. Heart sounds normal.  ABD/GI/GU: BS+. Abd soft, NTND.  Foley catheter in place. MSK/EXT:  Moves extremities. No apparent deformity. No edema.  SKIN: Surgical wound DCI. NEURO: Awake.  Oriented x 4.  No apparent focal neuro deficit. PSYCH: Calm. Normal affect.   Discharge Instructions Discharge Instructions     Call MD for:  difficulty breathing, headache or visual disturbances   Complete by: As directed    Call MD for:  difficulty breathing, headache or visual disturbances   Complete by: As directed    Call MD for:  extreme fatigue   Complete by: As directed    Call MD for:  hives   Complete by: As directed    Call MD for:  hives   Complete by: As directed    Call MD for:  persistant dizziness or light-headedness   Complete by: As directed    Call MD for:  persistant dizziness or light-headedness   Complete by: As directed    Call MD for:  persistant nausea and vomiting   Complete by: As directed    Call MD for:  persistant nausea and vomiting   Complete by: As directed    Call MD for:  redness, tenderness, or signs of infection (pain, swelling, redness, odor or green/yellow discharge around incision site)   Complete by: As directed    Call MD for:  redness, tenderness, or signs of infection (pain, swelling, redness, odor or green/yellow discharge around incision site)   Complete by: As directed    Call MD for:  severe uncontrolled pain   Complete by: As directed    Call MD for:  severe uncontrolled pain   Complete by: As directed    Call MD for:  temperature >100.4   Complete by: As directed    Diet - low sodium heart healthy   Complete by: As directed    Discharge  instructions   Complete by: As directed    Call [phone] for a followup appointment. Take a stool softener while you are using pain medications.   Discharge instructions   Complete by: As directed    It has been a pleasure taking care of you!  You were hospitalized for back surgery.  After surgery, you have mental status change/confusion, acute kidney injury, urinary retention and respiratory failure with oxygen  requirement most likely due to medications and dehydration.  His symptoms improved and your kidney recovered after adjustment of medication and IV fluid hydration.  We made  some adjustment to your home medication during this hospitalization.  Please review your new medication list and the directions on your medications before you take them.  Follow-up with your primary care doctor in 1 to 2 weeks.  Follow-up with your neurosurgeon per their recommendation.  Note that combination of MS Contin , Lyrica , Klonopin , baclofen  and Percocet can increase your risk of drowsiness, sedation, constipation, impaired judgment, respiratory depression that could potentially lead to death and impaired balance that could increase risk of fall.  We have decreased your Klonopin  and stop baclofen  per our discussion.  We strongly recommend talking to your doctors who prescribe them. We do not recommend driving, operating machinery or other activity that requires similar mental and physical engagement.     Take care,   Driving Restrictions   Complete by: As directed    Do not drive for 2 weeks.   If the dressing is still on your incision site when you go home, remove it on the third day after your surgery date. Remove dressing if it begins to fall off, or if it is dirty or damaged before the third day.   Complete by: As directed    Increase activity slowly   Complete by: As directed    Lifting restrictions   Complete by: As directed    Do not lift more than 5 pounds. No excessive bending or twisting.   May  shower / Bathe   Complete by: As directed    Remove the dressing for 3 days after surgery.  You may shower, but leave the incision alone.   No wound care   Complete by: As directed    Remove dressing in 48 hours   Complete by: As directed       Allergies as of 03/22/2024   No Known Allergies      Medication List     PAUSE taking these medications    losartan  50 MG tablet Wait to take this until: Mar 28, 2024 Commonly known as: COZAAR  take 1 tablet (50 milligram total) by mouth daily.   rosuvastatin  40 MG tablet Wait to take this until: Mar 28, 2024 Commonly known as: CRESTOR  take 1 tablet (40 MILLIGRAM total) by mouth daily.       STOP taking these medications    baclofen  10 MG tablet Commonly known as: LIORESAL    metoprolol  tartrate 25 MG tablet Commonly known as: LOPRESSOR    Vitamin E 180 MG (400 UNIT) Caps       TAKE these medications    acetaminophen  500 MG tablet Commonly known as: TYLENOL  Take 2 tablets (1,000 mg total) by mouth every 8 (eight) hours for 5 days, THEN 2 tablets (1,000 mg total) every 8 (eight) hours as needed for up to 5 days. Start taking on: March 22, 2024   Alpha-Lipoic Acid 600 MG Caps Take 1 capsule by mouth 2 (two) times daily. Takes with Lyrica    amLODipine  10 MG tablet Commonly known as: NORVASC  Take 1 tablet (10 mg total) by mouth daily.   aspirin EC 81 MG tablet Take 81 mg by mouth daily.   brexpiprazole  2 MG Tabs tablet Commonly known as: REXULTI  Take 2 mg by mouth daily.   carvedilol  6.25 MG tablet Commonly known as: COREG  Take 1 tablet (6.25 mg total) by mouth 2 (two) times daily with a meal.   clonazePAM  0.5 MG tablet Commonly known as: KLONOPIN  Take 1 tablet (0.5 mg total) by mouth 2 (two) times daily. What changed:  medication strength  how much to take   cyanocobalamin  1000 MCG tablet Take 1 tablet (1,000 mcg total) by mouth daily. What changed:  medication strength how much to take    cyclobenzaprine  10 MG tablet Commonly known as: FLEXERIL  Take 1 tablet (10 mg total) by mouth 3 (three) times daily as needed for muscle spasms.   docusate sodium  100 MG capsule Commonly known as: COLACE Take 1 capsule (100 mg total) by mouth 2 (two) times daily.   DULoxetine  60 MG capsule Commonly known as: CYMBALTA  Take 60 mg by mouth daily.   ferrous sulfate  325 (65 FE) MG tablet Take 325 mg by mouth daily.   lamoTRIgine  200 MG tablet Commonly known as: LAMICTAL  Take 200 mg by mouth 2 (two) times daily.   levothyroxine  125 MCG tablet Commonly known as: SYNTHROID  take 1 tablet (125 microgram total) by mouth daily before breakfast.   morphine  15 MG 12 hr tablet Commonly known as: MS CONTIN  Take 15 mg by mouth 3 (three) times daily.   Narcan 4 MG/0.1ML Liqd nasal spray kit Generic drug: naloxone Place 1 spray into the nose once.   nicotine  21 mg/24hr patch Commonly known as: NICODERM CQ  - dosed in mg/24 hours Place 1 patch (21 mg total) onto the skin daily.   oxyCODONE -acetaminophen  5-325 MG tablet Commonly known as: Percocet Take 1 tablet by mouth every 6 (six) hours as needed for severe pain (pain score 7-10) (Postoeprative pain).   pantoprazole  40 MG tablet Commonly known as: PROTONIX  TAKE (1) TABLET TWICE DAILY.   pregabalin  150 MG capsule Commonly known as: LYRICA  Take 150 mg by mouth 2 (two) times daily.   senna-docusate 8.6-50 MG tablet Commonly known as: Senokot-S Take 2 tablets by mouth 2 (two) times daily between meals as needed for mild constipation.   Sunosi  150 MG Tabs Generic drug: Solriamfetol  HCl Take 150 mg by mouth daily.   tamsulosin  0.4 MG Caps capsule Commonly known as: FLOMAX  Take 1 capsule (0.4 mg total) by mouth daily after supper.   valACYclovir  500 MG tablet Commonly known as: VALTREX  take 1 tablet (500 milligram total) by mouth daily.               Durable Medical Equipment  (From admission, onward)            Start     Ordered   03/21/24 1112  For home use only DME Shower stool  Once        03/21/24 1111   03/18/24 0843  For home use only DME 3 n 1  Once        03/18/24 4098              Discharge Care Instructions  (From admission, onward)           Start     Ordered   03/13/24 0000  If the dressing is still on your incision site when you go home, remove it on the third day after your surgery date. Remove dressing if it begins to fall off, or if it is dirty or damaged before the third day.        03/13/24 1541            Consultations: Neurosurgery admitted patient Cardiology  Procedures/Studies:   NM Pulmonary Perfusion Result Date: 03/19/2024 CLINICAL DATA:  Pulmonary embolism suspected.   High probability EXAM: NUCLEAR MEDICINE PERFUSION LUNG SCAN TECHNIQUE: Perfusion images were obtained in multiple projections after intravenous injection of radiopharmaceutical. RADIOPHARMACEUTICALS:  4.0 mCi Tc-63m  MAA COMPARISON:  None Available. FINDINGS: No wedge-shaped peripheral perfusion defect within LEFT or RIGHT lung to suggest acute pulmonary embolism. Normal perfusion pattern. IMPRESSION: No evidence acute pulmonary embolism. Electronically Signed   By: Deboraha Fallow M.D.   On: 03/19/2024 16:20   ECHOCARDIOGRAM COMPLETE Result Date: 03/19/2024    ECHOCARDIOGRAM REPORT   Patient Name:   EARNEST REGINA Kelemen Date of Exam: 03/19/2024 Medical Rec #:  [id number]       Height:       72.0 in Accession #:    [id number]      Weight:       264.8 lb Date of Birth:  01-22-1966       BSA:          2.400 m Patient Age:    57 years        BP:           115/84 mmHg Patient Gender: M               HR:           97 bpm. Exam Location:  Inpatient Procedure: 2D Echo, Color Doppler and Cardiac Doppler (Both Spectral and Color            Flow Doppler were utilized during procedure). Indications:    Pulmonary Embolus  History:        Patient has no prior history of Echocardiogram examinations.                  AAA; Risk Factors:Dyslipidemia and Hypertension.  Sonographer:    Hersey Lorenzo RDCS Referring Phys: [phone] EKTA V PATEL IMPRESSIONS  1. Left ventricular ejection fraction, by estimation, is 60 to 65%. The left ventricle has normal function. Left ventricular endocardial border not optimally defined to evaluate regional wall motion. There is moderate concentric left ventricular hypertrophy. Left ventricular diastolic parameters were normal.  2. Right ventricular systolic function is normal. The right ventricular size is normal. Tricuspid regurgitation signal is inadequate for assessing PA pressure.  3. The mitral valve is normal in structure. No evidence of mitral valve regurgitation. No evidence of mitral stenosis.  4. The aortic valve was not well visualized. Aortic valve regurgitation is not visualized. Aortic valve sclerosis/calcification is present, without any evidence of aortic stenosis.  5. The inferior vena cava is normal in size with greater than 50% respiratory variability, suggesting right atrial pressure of 3 mmHg. FINDINGS  Left Ventricle: Left ventricular ejection fraction, by estimation, is 60 to 65%. The left ventricle has normal function. Left ventricular endocardial border not optimally defined to evaluate regional wall motion. The left ventricular internal cavity size was normal in size. There is moderate concentric left ventricular hypertrophy. Left ventricular diastolic parameters were normal. Normal left ventricular filling pressure. Right Ventricle: The right ventricular size is normal. No increase in right ventricular wall thickness. Right ventricular systolic function is normal. Tricuspid regurgitation signal is inadequate for assessing PA pressure. Left Atrium: Left atrial size was normal in size. Right Atrium: Right atrial size was normal in size. Pericardium: There is no evidence of pericardial effusion. Mitral Valve: The mitral valve is normal in structure. No evidence of mitral valve  regurgitation. No evidence of mitral valve stenosis. Tricuspid Valve: The tricuspid valve is normal in structure. Tricuspid valve regurgitation is not demonstrated. No evidence of tricuspid stenosis. Aortic Valve: The aortic valve was not well visualized. Aortic valve regurgitation is not visualized. Aortic valve sclerosis/calcification is present, without any evidence of aortic  stenosis. Pulmonic Valve: The pulmonic valve was normal in structure. Pulmonic valve regurgitation is trivial. No evidence of pulmonic stenosis. Aorta: The aortic root is normal in size and structure. Venous: The inferior vena cava is normal in size with greater than 50% respiratory variability, suggesting right atrial pressure of 3 mmHg. IAS/Shunts: No atrial level shunt detected by color flow Doppler.  LEFT VENTRICLE PLAX 2D LVIDd:         5.30 cm   Diastology LVIDs:         3.60 cm   LV e' medial:    8.16 cm/s LV PW:         1.40 cm   LV E/e' medial:  10.9 LV IVS:        1.40 cm   LV e' lateral:   10.30 cm/s LVOT diam:     2.50 cm   LV E/e' lateral: 8.6 LV SV:         104 LV SV Index:   43 LVOT Area:     4.91 cm  RIGHT VENTRICLE         IVC TAPSE (M-mode): 2.3 cm  IVC diam: 2.00 cm LEFT ATRIUM             Index        RIGHT ATRIUM           Index LA diam:        3.70 cm 1.54 cm/m   RA Area:     19.00 cm LA Vol (A2C):   51.2 ml 21.33 ml/m  RA Volume:   52.30 ml  21.79 ml/m LA Vol (A4C):   47.8 ml 19.91 ml/m LA Biplane Vol: 50.2 ml 20.91 ml/m  AORTIC VALVE LVOT Vmax:   133.00 cm/s LVOT Vmean:  76.700 cm/s LVOT VTI:    0.212 m  AORTA Ao Root diam: 3.20 cm Ao Asc diam:  3.30 cm MITRAL VALVE MV Area (PHT): 4.60 cm    SHUNTS MV Decel Time: 165 msec    Systemic VTI:  0.21 m MV E velocity: 88.80 cm/s  Systemic Diam: 2.50 cm MV A velocity: 89.90 cm/s MV E/A ratio:  0.99 Gaylyn Keas MD Electronically signed by Gaylyn Keas MD Signature Date/Time: 03/19/2024/11:09:26 AM    Final    US  RENAL Result Date: 03/19/2024 CLINICAL DATA:  [id number]  AKI (acute kidney injury) (HCC) [id number] EXAM: RENAL / URINARY TRACT ULTRASOUND COMPLETE COMPARISON:  January 05, 2020, February 13, 2011 FINDINGS: Right Kidney: Renal measurements: 14.1 x 6.5 x 6.3 cm = volume: 302 mL. Normal echogenicity. Interpolar region cyst measuring 4.2 x 4.1 x 3.8 cm. No hydronephrosis or nephrolithiasis. Left Kidney: Renal measurements: 11.5 x 6.8 x 3.9 cm = volume: 160 mL. 1.6 x 1.8 x 1.8 cm cyst in the lower pole. Isoechoic structure measured by the sonographer in the left kidney measuring 2.1 x 2.5 x 1.5 cm. No mass. No hydronephrosis or nephrolithiasis. Bladder: Completely decompressed with a urinary catheter in place. Other: None. IMPRESSION: 1.  No hydronephrosis or nephrolithiasis. 2. Isoechoic structure in the left kidney (2.1 x 2.5 x 1.5 cm), measured by the sonographer, may represent a region of protuberant renal cortex or underlying renal lesion. A nonemergent CT or MRI of the abdomen with IV contrast should be considered for further characterization. Electronically Signed   By: Rance Burrows M.D.   On: 03/19/2024 10:48   MR BRAIN WO CONTRAST Result Date: 03/19/2024 CLINICAL DATA:  Altered mental status EXAM: MRI HEAD WITHOUT CONTRAST  TECHNIQUE: Multiplanar, multiecho pulse sequences of the brain and surrounding structures were obtained without intravenous contrast. COMPARISON:  08/03/2015 FINDINGS: Brain: No acute infarct, mass effect or extra-axial collection. No acute or chronic hemorrhage. There is multifocal hyperintense T2-weighted signal within the white matter. Parenchymal volume and CSF spaces are normal. The midline structures are normal. Vascular: Normal flow voids. Skull and upper cervical spine: Normal calvarium and skull base. Visualized upper cervical spine and soft tissues are normal. Sinuses/Orbits:Left maxillary sinus retention cyst. No mastoid effusion. Normal orbits. IMPRESSION: 1. No acute intracranial abnormality. 2. Findings of chronic small vessel  ischemia. Electronically Signed   By: Juanetta Nordmann M.D.   On: 03/19/2024 02:33   DG Chest 1 View Result Date: 03/18/2024 CLINICAL DATA:  Fever EXAM: CHEST  1 VIEW COMPARISON:  December 12, 2023 FINDINGS: Bilateral reticular interstitial infiltrates, edema versus pneumonia, correlate clinically. Minimal bilateral basilar atelectatic changes without consolidations No pleural effusion Heart upper limits of normal Stimulator device of the lower neck projections of the right hemithorax IMPRESSION: Bilateral reticular interstitial infiltrates, edema versus pneumonia, correlate clinically. Electronically Signed   By: Fredrich Jefferson M.D.   On: 03/18/2024 15:32   DG Lumbar Spine 2-3 Views Result Date: 03/12/2024 CLINICAL DATA:  [id number] Elective surgery [id number] EXAM: LUMBAR SPINE - 2-3 VIEW COMPARISON:  Preoperative imaging FINDINGS: Two fluoroscopic spot views of the lumbar spine submitted from the operating room. Pedicle screws at L4 and L5 with interbody spacer in place. Fluoroscopy time 23 seconds. Dose 22.77 mGy IMPRESSION: Intraoperative fluoroscopy during lumbar spine surgery. Electronically Signed   By: Chadwick Colonel M.D.   On: 03/12/2024 14:17   DG Lumbar Spine 1 View Result Date: 03/12/2024 CLINICAL DATA:  Elective surgery. EXAM: LUMBAR SPINE - 1 VIEW COMPARISON:  Preoperative imaging FINDINGS: Cross-table lateral view of the lumbar spine submitted from the operating room. Surgical instruments localize posteriorly at the L4 level. Instrument also projects over the sacrum. IMPRESSION: Cross-table lateral view of the lumbar spine for surgical localization. Electronically Signed   By: Chadwick Colonel M.D.   On: 03/12/2024 14:16   DG C-Arm 1-60 Min-No Report Result Date: 03/12/2024 Fluoroscopy was utilized by the requesting physician.  No radiographic interpretation.       The results of significant diagnostics from this hospitalization (including imaging, microbiology, ancillary and laboratory) are  listed below for reference.     Microbiology: Recent Results (from the past 240 hours)  Culture, blood (Routine X 2) w Reflex to ID Panel     Status: None (Preliminary result)   Collection Time: 03/18/24  2:58 PM   Specimen: BLOOD RIGHT ARM  Result Value Ref Range Status   Specimen Description BLOOD RIGHT ARM  Final   Special Requests   Final    BOTTLES DRAWN AEROBIC AND ANAEROBIC Blood Culture results may not be optimal due to an inadequate volume of blood received in culture bottles   Culture   Final    NO GROWTH 4 DAYS Performed at [facility], [address] [place], [place] [address]    Report Status PENDING  Incomplete  Culture, blood (Routine X 2) w Reflex to ID Panel     Status: None (Preliminary result)   Collection Time: 03/18/24  2:59 PM   Specimen: BLOOD LEFT ARM  Result Value Ref Range Status   Specimen Description BLOOD LEFT ARM  Final   Special Requests   Final    BOTTLES DRAWN AEROBIC AND ANAEROBIC Blood Culture results may not be  optimal due to an inadequate volume of blood received in culture bottles   Culture   Final    NO GROWTH 4 DAYS Performed at [facility], [address] [place], [place] [address]    Report Status PENDING  Incomplete  Urine Culture (for pregnant, neutropenic or urologic patients or patients with an indwelling urinary catheter)     Status: None   Collection Time: 03/18/24  5:00 PM   Specimen: Urine, Clean Catch  Result Value Ref Range Status   Specimen Description URINE, CLEAN CATCH  Final   Special Requests NONE  Final   Culture   Final    NO GROWTH Performed at [facility], [address], [place], [place] [address]    Report Status 03/19/2024 FINAL  Final     Labs:  CBC: Recent Labs  Lab 03/18/24 1458 03/19/24 0248 03/20/24 0235 03/21/24 0318  WBC 8.4 7.8 5.7 6.7  NEUTROABS 6.3 5.2  --   --   HGB 8.4* 9.0* 8.3* 8.4*  HCT 25.0* 27.7* 25.7* 26.3*  MCV 89.6 90.5 90.5 90.1  PLT 204 208 200 269    BMP &GFR Recent Labs  Lab 03/18/24 2116 03/19/24 0248 03/20/24 0822 03/21/24 0318 03/22/24 0251  NA 136 138 141 143 142  K 3.9 5.2* 3.5 3.6 3.2*  CL 98 98 101 106 104  CO2 26 28 26 26 26   GLUCOSE 109* 104* 121* 100* 107*  BUN 44* 44* 37* 27* 19  CREATININE 2.87* 2.94* 2.20* 1.83* 1.42*  CALCIUM  8.1* 8.5* 8.8* 8.6* 9.2  MG  --   --   --  2.4 2.2  PHOS  --  5.6*  --  2.9  --    Estimated Creatinine Clearance: 79.5 mL/min (A) (by C-G formula based on SCr of 1.42 mg/dL (H)). Liver & Pancreas: Recent Labs  Lab 03/18/24 1458 03/19/24 0248 03/20/24 0822 03/21/24 0318 03/22/24 0251  AST 53* 56* 53*  --  30  ALT 12 13 13   --  11  ALKPHOS 54 57 61  --  59  BILITOT 1.1 1.0 0.9  --  0.6  PROT 5.7* 5.7* 5.9*  --  5.8*  ALBUMIN  2.5* 2.6* 2.6* 2.4* 2.8*   No results for input(s): "LIPASE", "AMYLASE" in the last 168 hours. Recent Labs  Lab 03/18/24 1856  AMMONIA 37*   Diabetic: No results for input(s): "HGBA1C" in the last 72 hours. No results for input(s): "GLUCAP" in the last 168 hours. Cardiac Enzymes: Recent Labs  Lab 03/18/24 1938 03/19/24 0832 03/20/24 0822 03/21/24 0318 03/22/24 0251  CKTOTAL 2,590* 3,556* 2,282* 809* 508*   No results for input(s): "PROBNP" in the last 8760 hours. Coagulation Profile: No results for input(s): "INR", "PROTIME" in the last 168 hours. Thyroid  Function Tests: No results for input(s): "TSH", "T4TOTAL", "FREET4", "T3FREE", "THYROIDAB" in the last 72 hours. Lipid Profile: Recent Labs    03/20/24 0235  CHOL 73  HDL 20*  LDLCALC 31  TRIG 098  CHOLHDL 3.7   Anemia Panel: Recent Labs    03/20/24 0822 03/20/24 0823  FERRITIN 570*  --   TIBC 227*  --   IRON 36*  --   RETICCTPCT  --  2.1   Urine analysis:    Component Value Date/Time   COLORURINE YELLOW 03/18/2024 1342   APPEARANCEUR HAZY (A) 03/18/2024 1342   LABSPEC 1.011 03/18/2024 1342   PHURINE 5.0 03/18/2024 1342   GLUCOSEU NEGATIVE 03/18/2024 1342   HGBUR  SMALL (A) 03/18/2024 1342   BILIRUBINUR NEGATIVE 03/18/2024 1342   KETONESUR NEGATIVE 03/18/2024 1342   PROTEINUR 100 (A) 03/18/2024 1342   UROBILINOGEN 0.2 11/10/2013 1522   NITRITE NEGATIVE 03/18/2024 1342   LEUKOCYTESUR NEGATIVE 03/18/2024 1342   Sepsis Labs: Invalid input(s): "PROCALCITONIN", "LACTICIDVEN"   SIGNED:  Theadore Finger, MD  [facility] 03/22/2024, 2:44 PM

## 2024-03-22 NOTE — Progress Notes (Signed)
 RN reported that patient requesting for antidiarrheal medication.  He has 2 episodes of loose stool.  Per chart review patient initially had constipation on bowel regimen are having diarrhea.  Starting loperamide as needed.

## 2024-03-22 NOTE — Progress Notes (Signed)
  NEUROSURGERY PROGRESS NOTE    no issues overnight, patient laying in bed with wife at bedside. he has no complaints today.   EXAM:  BP (!) 187/93   Pulse 83   Temp 97.9 F (36.6 C) (Oral)   Resp 20   Ht 6' (1.829 m)   Wt 122.6 kg   SpO2 96%   BMI 36.66 kg/m    awake, alert, oriented,  moving all extremities well.  wound is clean dry and intact.   IMPRESSION:  58 y.o. male  status post lumbar fusion with post-operative acute kidney injury, now resolved.   PLAN: - planning on discharge home today per primary team, follow up in your surgery office already scheduled in two weeks.     Augusto Blonder, MD [facility]

## 2024-03-22 NOTE — Plan of Care (Signed)
  Problem: Education: Goal: Knowledge of General Education information will improve Description: Including pain rating scale, medication(s)/side effects and non-pharmacologic comfort measures Outcome: Adequate for Discharge   

## 2024-03-22 NOTE — TOC Transition Note (Signed)
 Transition of Care [facility]) - Discharge Note   Patient Details  Name: Tony Campos MRN: [id number] Date of Birth: 09/19/1966  Transition of Care [facility]) CM/SW Contact:  Omie Bickers, RN Phone Number: 03/22/2024, 8:12 AM   Clinical Narrative:     Notified Amy at [facility] that patient will DC home today.  Per chart review, DME provided to wife, no additional needs identified for DC.   Final next level of care: Home w Home Health Services Barriers to Discharge: No Barriers Identified   Patient Goals and CMS Choice            Discharge Placement                       Discharge Plan and Services Additional resources added to the After Visit Summary for                              [facility] Agency: [facility] Date [facility] Agency Contacted: 03/22/24 Time HH Agency Contacted: [phone] Representative spoke with at [facility] Agency: Amy  Social Drivers of Health (SDOH) Interventions SDOH Screenings   Food Insecurity: No Food Insecurity (03/12/2024)  Housing: Low Risk  (03/12/2024)  Transportation Needs: No Transportation Needs (03/12/2024)  Utilities: Not At Risk (03/12/2024)  Alcohol Screen: Low Risk  (12/05/2022)  Depression (PHQ2-9): High Risk (03/04/2024)  Financial Resource Strain: Medium Risk (12/11/2023)  Physical Activity: Insufficiently Active (12/11/2023)  Social Connections: Unknown (12/11/2023)  Stress: Stress Concern Present (12/11/2023)  Tobacco Use: High Risk (03/12/2024)     Readmission Risk Interventions     No data to display

## 2024-03-23 LAB — CULTURE, BLOOD (ROUTINE X 2)
Culture: NO GROWTH
Culture: NO GROWTH

## 2024-03-24 ENCOUNTER — Telehealth: Payer: Self-pay | Admitting: *Deleted

## 2024-03-24 NOTE — Transitions of Care (Post Inpatient/ED Visit) (Signed)
   03/24/2024  Name: Tony Campos MRN: [id number] DOB: Nov 17, 1966  Today's TOC FU Call Status: Today's TOC FU Call Status:: Unsuccessful Call (1st Attempt) Unsuccessful Call (1st Attempt) Date: 03/24/24  Attempted to reach the patient regarding the most recent Inpatient/ED visit.  Follow Up Plan: Additional outreach attempts will be made to reach the patient to complete the Transitions of Care (Post Inpatient/ED visit) call.   Cecilie Coffee [facility], BSN RN Care Manager/ Transition of Care [place]/ [facility] [phone]

## 2024-03-25 ENCOUNTER — Telehealth: Payer: Self-pay

## 2024-03-25 NOTE — Transitions of Care (Post Inpatient/ED Visit) (Signed)
 03/25/2024  Name: Tony Campos MRN: [id number] DOB: Nov 22, 1966  Today's TOC FU Call Status: Today's TOC FU Call Status:: Successful TOC FU Call Completed TOC FU Call Complete Date: 03/25/24 Patient's Name and Date of Birth confirmed.  Transition Care Management Follow-up Telephone Call How have you been since you were released from the hospital?: Better (much better, pain level 2) Any questions or concerns?: No  Items Reviewed: Did you receive and understand the discharge instructions provided?: Yes Medications obtained,verified, and reconciled?: Yes (Medications Reviewed) Any new allergies since your discharge?: No Dietary orders reviewed?: NA Do you have support at home?: Yes People in Home [RPT]: spouse Name of Support/Comfort Primary Source: wife  Medications Reviewed Today: Medications Reviewed Today     Reviewed by Vanetta Generous, RN (Registered Nurse) on 03/25/24 at 1300  Med List Status: <None>   Medication Order Taking? Sig Documenting Provider Last Dose Status Informant  acetaminophen  (TYLENOL ) 500 MG tablet [id number] Yes Take 2 tablets (1,000 mg total) by mouth every 8 (eight) hours for 5 days, THEN 2 tablets (1,000 mg total) every 8 (eight) hours as needed for up to 5 days. Gonfa, Taye T, MD Taking Active   Alpha-Lipoic Acid 600 MG CAPS [id number] Yes Take 1 capsule by mouth 2 (two) times daily. Takes with Lyrica  [provider] Taking Active Self  amLODipine  (NORVASC ) 10 MG tablet [id number] Yes Take 1 tablet (10 mg total) by mouth daily. Gonfa, Taye T, MD Taking Active   aspirin EC 81 MG tablet [id number] Yes Take 81 mg by mouth daily. [provider] Taking Active Self  brexpiprazole  (REXULTI ) 2 MG TABS tablet [id number] Yes Take 2 mg by mouth daily. [provider] Taking Active Self  carvedilol  (COREG ) 6.25 MG tablet [id number] Yes Take 1 tablet (6.25 mg total) by mouth 2 (two) times daily with a meal. Theadore Finger, MD Taking Active    clonazePAM  (KLONOPIN ) 0.5 MG tablet [id number] Yes Take 1 tablet (0.5 mg total) by mouth 2 (two) times daily. Gonfa, Taye T, MD Taking Active   cyanocobalamin  1000 MCG tablet [id number] Yes Take 1 tablet (1,000 mcg total) by mouth daily. Gonfa, Taye T, MD Taking Active   cyclobenzaprine  (FLEXERIL ) 10 MG tablet [id number] Yes Take 1 tablet (10 mg total) by mouth 3 (three) times daily as needed for muscle spasms. Bergman, Meghan D, NP Taking Active   docusate sodium  (COLACE) 100 MG capsule [id number] Yes Take 1 capsule (100 mg total) by mouth 2 (two) times daily. Bergman, Meghan D, NP Taking Active   DULoxetine  (CYMBALTA ) 60 MG capsule [id number] Yes Take 60 mg by mouth daily. [provider] Taking Active Self    Discontinued 03/19/13 1008 (Change in therapy)   ferrous sulfate  325 (65 FE) MG tablet [id number] Yes Take 325 mg by mouth daily. [provider] Taking Active Self  lamoTRIgine  (LAMICTAL ) 200 MG tablet [id number] Yes Take 200 mg by mouth 2 (two) times daily. [provider] Taking Active Self  levothyroxine  (SYNTHROID ) 125 MCG tablet [id number] Yes take 1 tablet (125 microgram total) by mouth daily before breakfast. Austine Lefort, MD Taking Active Self  losartan  (COZAAR ) 50 MG tablet [id number] No take 1 tablet (50 milligram total) by mouth daily.  Patient not taking: Reported on 03/25/2024   Austine Lefort, MD Not Taking Active Self  morphine  (MS CONTIN ) 15 MG 12 hr tablet [id number] Yes Take 15 mg by mouth 3 (three) times daily. [provider] Taking Active Self  Med Note (ROSE, Kiyanna Biegler U   Tue Mar 25, 2024 12:58 PM) Taking 2 times per day  NARCAN 4 MG/0.1ML LIQD nasal spray kit [id number] No Place 1 spray into the nose once.  Patient not taking: Reported on 03/25/2024   [provider] Not Taking Active Self           Med Note (DAVIS, NATALIE M   Wed Mar 12, 2024  6:53 AM) Never used per patient  nicotine  (NICODERM CQ  - DOSED IN MG/24  HOURS) 21 mg/24hr patch [id number] Yes Place 1 patch (21 mg total) onto the skin daily. Gonfa, Taye T, MD Taking Active   oxyCODONE -acetaminophen  (PERCOCET) 5-325 MG tablet [id number] Yes Take 1 tablet by mouth every 6 (six) hours as needed for severe pain (pain score 7-10) (Postoeprative pain). Bergman, Meghan D, NP Taking Active   pantoprazole  (PROTONIX ) 40 MG tablet [id number] Yes TAKE (1) TABLET TWICE DAILY. Austine Lefort, MD Taking Active Self  pregabalin  (LYRICA ) 150 MG capsule [id number] Yes Take 150 mg by mouth 2 (two) times daily. [provider] Taking Active Self  rosuvastatin  (CRESTOR ) 40 MG tablet [id number] No take 1 tablet (40 MILLIGRAM total) by mouth daily.  Patient not taking: Reported on 03/25/2024   Austine Lefort, MD Not Taking Active   senna-docusate (SENOKOT-S) 8.6-50 MG tablet [id number] Yes Take 2 tablets by mouth 2 (two) times daily between meals as needed for mild constipation. Gonfa, Taye T, MD Taking Active   Solriamfetol  HCl (SUNOSI ) 150 MG TABS [id number] No Take 150 mg by mouth daily.  Patient not taking: Reported on 03/25/2024   [provider] Not Taking Active Self  tamsulosin  (FLOMAX ) 0.4 MG CAPS capsule [id number] No Take 1 capsule (0.4 mg total) by mouth daily after supper.  Patient not taking: Reported on 03/25/2024   Gonfa, Taye T, MD Not Taking Active   valACYclovir  (VALTREX ) 500 MG tablet [id number] Yes take 1 tablet (500 milligram total) by mouth daily. Austine Lefort, MD Taking Active Self            Home Care and Equipment/Supplies: Were Home Health Services Ordered?: Yes Name of Home Health Agency:: [facility] Has Agency set up a time to come to your home?: Yes First Home Health Visit Date: 03/25/24 Any new equipment or medical supplies ordered?: NA  Functional Questionnaire: Do you need assistance with bathing/showering or dressing?: No Do you need assistance with meal preparation?: Yes (wife) Do you need assistance with  eating?: No Do you have difficulty maintaining continence: No Do you need assistance with getting out of bed/getting out of a chair/moving?: No Do you have difficulty managing or taking your medications?: Yes (wife)  Follow up appointments reviewed: PCP Follow-up appointment confirmed?: Yes Date of PCP follow-up appointment?: 03/27/24 Follow-up Provider: Dr. Cheril Cork Specialist [facility] Follow-up appointment confirmed?: Yes Date of Specialist follow-up appointment?: 04/22/24 Follow-Up Specialty Provider:: Dr. Pleasant Brilliant Do you need transportation to your follow-up appointment?: No Do you understand care options if your condition(s) worsen?: Yes-patient verbalized understanding  SDOH Interventions Today    Flowsheet Row Most Recent Value  SDOH Interventions   Food Insecurity Interventions Intervention Not Indicated  Housing Interventions Intervention Not Indicated  Transportation Interventions Intervention Not Indicated  Utilities Interventions Intervention Not Indicated      Patient reports that he is doing well. Reports wearing his back brace when up walking.  Reports pain is under good control.  Reports home health has been in to see him and will be coming  back later this week.  Reviewed all discharge instruction including medications. Reviewed lifting restrictions as well as bending and twisting restrictions.  Patient reports that he has good support at home.  Reviewed and offered 30 day TOC program and patient declined.  Orpha Blade, RN, BSN, CEN [employer]- Transition of Care Team.  [facility] [phone]

## 2024-03-27 ENCOUNTER — Ambulatory Visit: Admitting: Family Medicine

## 2024-03-27 ENCOUNTER — Encounter: Payer: Self-pay | Admitting: Family Medicine

## 2024-03-27 VITALS — BP 144/74 | HR 89 | Temp 98.4°F | Ht 72.0 in | Wt 265.0 lb

## 2024-03-27 DIAGNOSIS — M4316 Spondylolisthesis, lumbar region: Secondary | ICD-10-CM | POA: Diagnosis not present

## 2024-03-27 DIAGNOSIS — Z79899 Other long term (current) drug therapy: Secondary | ICD-10-CM

## 2024-03-27 DIAGNOSIS — N179 Acute kidney failure, unspecified: Secondary | ICD-10-CM

## 2024-03-27 DIAGNOSIS — J9600 Acute respiratory failure, unspecified whether with hypoxia or hypercapnia: Secondary | ICD-10-CM | POA: Diagnosis not present

## 2024-03-27 DIAGNOSIS — N2889 Other specified disorders of kidney and ureter: Secondary | ICD-10-CM

## 2024-03-27 NOTE — Progress Notes (Signed)
 Subjective:    Patient ID: Tony Campos, male    DOB: 1966/06/05, 58 y.o.   MRN: [id number]  Admit date: 03/12/2024 Discharge date: 03/22/24   Admitted From: Home Disposition: Home Recommendations for Outpatient Follow-up:  Outpatient follow-up with PCP and neurosurgery as below Reassess BP, CMP and CBC at follow-up and adjust meds as appropriate. Patient is at risk for polypharmacy on multiple sedating medications.  Discontinued baclofen  and decrease Klonopin .  Recommend further review outpatient. Patient with left renal lesion.  Needs outpatient CT or MRI with contrast when AKI resolves Consider referral to urology. Please follow up on the following pending results: None    Hospital course 58 year old M with PMH of AAA without rupture, OSA on inspire, PAD s/p right SFA angioplasty/stent in 12/2025, HTN, hypothyroidism, hypogonadism, anxiety, depression, bipolar disorder, tobacco use disorder, GERD, morbid obesity and lumbar spondylolithiasis.  Patient was admitted by neurosurgery for lumbar spondylolisthesis and underwent surgery.  Postoperative course complicated by acute respiratory failure, AKI and encephalopathy for which he was transferred to hospital service on 03/18/2024.   On chart review, patient hypoxic requiring up to 6 L.  ABG with mild chronic respiratory acidosis.  Patient was on multiple sedating medication for pain and anxiety.  Cr up to 2.56 (baseline 0.8-1.0).  Troponin elevated to 2600 x 3.  CK elevated to 2600.  Patient without chest pain or ischemic finding on EKG.  CXR with bilateral reticular interstitial infiltrates, edema versus pneumonia.  B12 low at 160.  Echocardiogram and VQ scan ordered.  Started on IV heparin  pending VQ scan.   Patient's mental status improved after adjusting sedating medications.  TTE and VQ scan without significant finding.  Heparin  discontinued.  No additional input by cardiology.  AKI continued to improve with IV fluid.    On the day of  discharge, encephalopathy resolved.  AKI continued to improve off IV fluid.  Passed voiding trial.  He is discharged to follow-up with PCP and neurosurgery.  We have adjusted sedating medications on discharge by stopping baclofen  and decreasing Klonopin .  Recommend further adjustment as appropriate outpatient.   Patient's renal ultrasound showed left renal lesion.  He needs CT or MRI with contrast outpatient for further evaluation.  He may need outpatient referral to urology as well.   See individual problem list below for more.    Problems addressed during this hospitalization L4-5 spondylolisthesis with lumbar radiculopathy, neurogenic claudication -S/p Bilateral L4-5 laminotomy/foraminotomies/medial facetectomy to decompress on 4/16 -Continue home MS Contin  and Lyrica . -Was given prescription for Percocet by neurosurgery -Discontinued baclofen  due to risk for polypharmacy. -Bowel regimen -Per neurosurgery.   Acute metabolic encephalopathy: This is likely iatrogenic from multiple sedating medications.  Patient was on MS Contin , clonazepam , Flexeril , Lyrica  and IV fentanyl . MRI brain without acute finding.  B12 low at 160.  VBG with mild compensated respiratory acidosis.  Encephalopathy resolved after adjusting meds.  Oriented x 4.   No focal neurodeficit on exam. -Adjusted sedating medications.  Recommend further adjustment -Received vitamin B12 injection 1000 mcg daily for 3 days and discharged on p.o.   Acute respiratory failure with hypoxia and hypercapnia: This is likely from multiple sedating medication underlying OSA.  Patient was not activating his aspire.  VBG with mild compensated respiratory acidosis.  No significant event on CXR.  Excellent effort on incentive parameter.  VQ scan negative for PE.  Appears euvolemic on exam.  BNP 228.  TTE without significant finding.  Respiratory failure resolved.  Maintained appropriate saturation with  ambulation on room air. -Encouraged to activate  his Aspire before he falls asleep.   Acute kidney injury: Unclear etiology.  No contrast administration.  Losartan  discontinued on 4/20.  Has urinary retention with removal of 800 cc urine on 4/22.  Renal US  with no hydronephrosis of nephrolithiasis but 2.1 x 2.5 x 1.5 cm isoechoic structure of left kidney.  AKI resolving. Recent Labs (within last 365 days)             Recent Labs    06/18/23 1628 09/28/23 1554 03/06/24 1330 03/18/24 0848 03/18/24 2116 03/19/24 0248 03/20/24 0822 03/21/24 0318 03/22/24 0251  BUN 11 6* 8 38* 44* 44* 37* 27* 19  CREATININE 0.85 0.83 1.07 2.56* 2.87* 2.94* 2.20* 1.83* 1.42*    - Advised to hold losartan  for a week - Recheck renal function at follow-up   Urinary retention: Patient had 800 cc urine removed with Foley catheter on 4/22.  This could be due to constipation.  He also had back surgery on 4/16.   Renal US  as above.  Passed voiding trial on 4/25. - Continue Flomax    Acute blood loss anemia: Reported EBL of 200 cc.  No obvious bleeding.  H&H currently stable.  No nutritional deficiency on anemia panel: H&H stable. Recent Labs (within last 365 days)          Recent Labs    06/18/23 1628 03/04/24 1218 03/18/24 1458 03/19/24 0248 03/20/24 0235 03/21/24 0318  HGB 13.4 12.4* 8.4* 9.0* 8.3* 8.4*    - Recheck CBC in 1 week     Elevated troponin: High-sensitivity troponin 2626, 2700, 2800.  Could be patient of demand ischemia due to respiratory failure and delayed clearance due to renal failure.  Patient without chest pain or acute ischemic finding on EKG.  TTE reassuring but not able to assess RWMA.  No additional input from cardiology.   Uncontrolled hypertension: BP elevated but improved.  -Started amlodipine  10 mg daily and Coreg  6.25 mg twice daily -Hold home losartan  for about a week in the setting of AKI - Reassess BP and adjust meds as appropriate outpatient - Advised to activate his aspire   Elevated CK: CK trended from 2600-3500>  2300>> 500 -Continue holding statin   Vitamin B12 deficiency: B12 160 - Vitamin B12 injection 1000 mcg daily for 3 days followed by p.o.   Left renal lesion: Renal ultrasound showed 2.1 x 2.5 x 1.5 cm isoechoic structure of left kidney.  Nonemergent CT or MRI of abdomen with IV contrast recommended for further catheterization. -CT or MRI abdomen with IV contrast with AKI results. -Consider referral to urology     AAA/history of PAD -Outpatient follow-up   Anxiety, depression and bipolar disorder - Decreased Klonopin  due to encephalopathy - Continue Cymbalta , Lamictal , Rexulti    Tobacco use disorder - Encouraged smoking cessation   OSA - Advised wife to activate aspire   Constipation: Wife reported last bowel movement was before admission.  Resolved. -Continue bowel regimen   Hypothyroidism: TSH slightly low with slightly elevated free T4. -Continue home Synthroid  -Recheck thyroid  panel in 4 to 6 weeks   Class II obesity Body mass index is 36.66 kg/m. - Encourage lifestyle change to lose weight  03/27/24 Patient is under the care of pain specialist.  From the pain specialist he is receiving MS Contin  along with Lyrica .  Patient sees psychiatry for his Rexulti  and Cymbalta  and Klonopin .  He was receiving baclofen  as a muscle relaxer from his pain specialist.  In  the hospital he was receiving IV fentanyl .  Patient developed acute respiratory failure and hypoxia.  He also developed an elevated CK level.  Patient's family reports that his urine looked like tea rule.  This raises concern about possible rhabdomyolysis triggered by immobility coupled with his statin.  After urinary retention was treated, his statin was held, and with IV fluid his renal function improved.  After discontinuation of baclofen  and oxygen , the patient's respiratory status improved.  We had a long discussion today about his polypharmacy.  I believe that his medications put him at risk for accidental overdose.  We  have discussed this at length in the past.   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   Sleep apnea 12/2015   No cpap, has Inspire   Smoker    Spondylosis    Testosterone  deficiency    Past Surgical History:  Procedure Laterality Date   APPENDECTOMY  12/2018   at [facility]   CHOLECYSTECTOMY  2007   CLOSED REDUCTION MANDIBLE N/A 03/09/2018   Procedure: CLOSED REDUCTION MANDIBULAR MMF;  Surgeon: Ammon Bales, MD;  Location[facility] OR;  Service: ENT;  Laterality: N/A;   COLONOSCOPY  07/26/2020   10/22/2018   DRUG INDUCED ENDOSCOPY N/A 04/06/2021   Procedure: DRUG INDUCED ENDOSCOPY;  Surgeon: Virgina Grills, MD;  Location: [facility];  Service: ENT;  Laterality: N/A;   IMPLANTATION OF HYPOGLOSSAL NERVE STIMULATOR Right 07/20/2021   Procedure: IMPLANTATION OF HYPOGLOSSAL NERVE STIMULATOR;  Surgeon: Virgina Grills, MD;  Location: [facility];  Service: ENT;  Laterality: Right;   KNEE SURGERY     3 on right knee and 1 on left knee   LAPAROSCOPIC APPENDECTOMY N/A 07/25/2018   Procedure: APPENDECTOMY LAPAROSCOPIC;  Surgeon: Alanda Allegra, MD;  Location: [facility];  Service: General;  Laterality: N/A;   MANDIBULAR HARDWARE REMOVAL N/A 05/06/2018   Procedure: REMOVAL OF MMF HARDWARE;  Surgeon: Ammon Bales, MD;  Location:  SURGERY CENTER;  Service: ENT;  Laterality: N/A;   NASAL SEPTUM SURGERY     NASAL SINUS SURGERY     POLYPECTOMY     right sfa recanalization and stenting  Right 01/12/2016    [facility]   UPPER GASTROINTESTINAL ENDOSCOPY  07/26/2020   WRIST SURGERY     right and left   Current Outpatient Medications on File Prior to Visit  Medication Sig Dispense Refill   acetaminophen  (TYLENOL ) 500 MG tablet Take 2 tablets (1,000 mg total) by mouth every 8 (eight) hours for 5 days, THEN 2 tablets (1,000 mg total) every 8 (eight) hours as needed for up to 5 days.     Alpha-Lipoic Acid 600 MG CAPS Take 1 capsule by mouth 2 (two) times daily. Takes with Lyrica      amLODipine  (NORVASC ) 10 MG tablet Take 1 tablet (10 mg total) by mouth daily. 90 tablet 1   aspirin EC 81 MG tablet Take 81 mg  by mouth daily.     brexpiprazole  (REXULTI ) 2 MG TABS tablet Take 2 mg by mouth daily.     carvedilol  (COREG ) 6.25 MG tablet Take 1 tablet (6.25 mg total) by mouth 2 (two) times daily with a meal. 180 tablet 0   clonazePAM  (KLONOPIN ) 0.5 MG tablet Take 1 tablet (0.5 mg total) by mouth 2 (two) times daily. 60 tablet 0   cyanocobalamin  1000 MCG tablet Take 1 tablet (1,000 mcg total) by mouth daily.     cyclobenzaprine  (FLEXERIL ) 10 MG tablet Take 1 tablet (10 mg total) by mouth 3 (three) times daily as needed for muscle spasms. 30 tablet 0   docusate sodium  (COLACE) 100 MG capsule Take 1 capsule (100 mg total) by mouth 2 (two) times daily. 10 capsule 0   DULoxetine  (CYMBALTA ) 60 MG capsule Take 60 mg by mouth daily.     ferrous sulfate  325 (65 FE) MG tablet Take 325 mg by mouth daily.     lamoTRIgine  (LAMICTAL ) 200 MG tablet Take 200 mg by mouth 2 (two) times daily.     levothyroxine  (SYNTHROID ) 125 MCG tablet take 1 tablet (125 microgram total) by mouth daily before breakfast. 90 tablet 0   [Paused] losartan  (COZAAR ) 50 MG tablet take 1 tablet (50 milligram total) by mouth daily. (Patient not taking: Reported on 03/25/2024) 90 tablet 0   morphine  (MS CONTIN ) 15 MG 12 hr tablet Take 15 mg by mouth 3 (three) times daily.     NARCAN 4 MG/0.1ML LIQD nasal spray kit Place 1 spray into the nose once. (Patient  not taking: Reported on 03/25/2024)     nicotine  (NICODERM CQ  - DOSED IN MG/24 HOURS) 21 mg/24hr patch Place 1 patch (21 mg total) onto the skin daily.     oxyCODONE -acetaminophen  (PERCOCET) 5-325 MG tablet Take 1 tablet by mouth every 6 (six) hours as needed for severe pain (pain score 7-10) (Postoeprative pain). 20 tablet 0   pantoprazole  (PROTONIX ) 40 MG tablet TAKE (1) TABLET TWICE DAILY. 60 tablet 11   pregabalin  (LYRICA ) 150 MG capsule Take 150 mg by mouth 2 (two) times daily.     [Paused] rosuvastatin  (CRESTOR ) 40 MG tablet take 1 tablet (40 MILLIGRAM total) by mouth daily. (Patient not taking: Reported on 03/25/2024) 90 tablet 1   senna-docusate (SENOKOT-S) 8.6-50 MG tablet Take 2 tablets by mouth 2 (two) times daily between meals as needed for mild constipation.     Solriamfetol  HCl (SUNOSI ) 150 MG TABS Take 150 mg by mouth daily. (Patient not taking: Reported on 03/25/2024)     tamsulosin  (FLOMAX ) 0.4 MG CAPS capsule Take 1 capsule (0.4 mg total) by mouth daily after supper. (Patient not taking: Reported on 03/25/2024) 30 capsule 0   valACYclovir  (VALTREX ) 500 MG tablet take 1 tablet (500 milligram total) by mouth daily. 90 tablet 0   [DISCONTINUED] esomeprazole (NEXIUM) 40 MG capsule Take 40 mg by mouth daily before breakfast.     No current facility-administered medications on file prior to visit.      No Known Allergies Social History   Socioeconomic History   Marital status: Married    Spouse name: Not on file   Number of children: 0   Years of education: Not on file   Highest education level: Associate degree: occupational, Scientist, product/process development, or vocational program  Occupational History   Not on file  Tobacco Use   Smoking status: Every Day    Current packs/day: 0.25    Average packs/day: 0.3 packs/day for 0.1 years  Types: Cigarettes    Start date: 02/2024    Passive exposure: Current   Smokeless tobacco: Never   Tobacco comments:    Less than 1/2 pack per day 01/22/23 PAP   Vaping Use   Vaping status: Former  Substance and Sexual Activity   Alcohol use: No   Drug use: No   Sexual activity: Yes  Other Topics Concern   Not on file  Social History Narrative   Not on file   Social Drivers of Health   Financial Resource Strain: Medium Risk (12/11/2023)   Overall Financial Resource Strain (CARDIA)    Difficulty of Paying Living Expenses: Somewhat hard  Food Insecurity: No Food Insecurity (03/25/2024)   Hunger Vital Sign    Worried About Running Out of Food in the Last Year: Never true    Ran Out of Food in the Last Year: Never true  Transportation Needs: No Transportation Needs (03/25/2024)   PRAPARE - Administrator, Civil Service (Medical): No    Lack of Transportation (Non-Medical): No  Physical Activity: Insufficiently Active (12/11/2023)   Exercise Vital Sign    Days of Exercise per Week: 1 day    Minutes of Exercise per Session: 10 min  Stress: Stress Concern Present (12/11/2023)   [employer] of Occupational Health - Occupational Stress Questionnaire    Feeling of Stress : To some extent  Social Connections: Unknown (12/11/2023)   Social Connection and Isolation Panel [NHANES]    Frequency of Communication with Friends and Family: Once a week    Frequency of Social Gatherings with Friends and Family: Patient declined    Attends Religious Services: 1 to 4 times per year    Active Member of [employer] or Organizations: No    Attends Engineer, structural: Not on file    Marital Status: Married  Catering manager Violence: Not At Risk (03/25/2024)   Humiliation, Afraid, Rape, and Kick questionnaire    Fear of Current or Ex-Partner: No    Emotionally Abused: No    Physically Abused: No    Sexually Abused: No      Review of Systems  All other systems reviewed and are negative.      Objective:   Physical Exam Vitals reviewed.  Constitutional:      General: He is not in acute distress.    Appearance: Normal appearance.  He is not ill-appearing, toxic-appearing or diaphoretic.  HENT:     Head: Normocephalic and atraumatic.  Cardiovascular:     Rate and Rhythm: Normal rate and regular rhythm.     Heart sounds: Normal heart sounds. No murmur heard.    No friction rub. No gallop.  Pulmonary:     Effort: Pulmonary effort is normal.     Breath sounds: No wheezing or rhonchi.  Musculoskeletal:     Right lower leg: No edema.     Left lower leg: No edema.  Neurological:     General: No focal deficit present.     Mental Status: He is alert and oriented to person, place, and time.         Assessment & Plan:  Left kidney mass - Plan: MR Abdomen W Wo Contrast  Other specified disorders of kidney and ureter - Plan: MR Abdomen W Wo Contrast  Acute respiratory failure, unspecified whether with hypoxia or hypercapnia (HCC) - Plan: CBC with Differential/Platelet, COMPLETE METABOLIC PANEL WITHOUT GFR  Polypharmacy - Plan: CBC with Differential/Platelet, COMPLETE METABOLIC PANEL WITHOUT GFR  Acute renal  failure, unspecified acute renal failure type (HCC) - Plan: CBC with Differential/Platelet, COMPLETE METABOLIC PANEL WITHOUT GFR, CK I have asked the patient to work with his pain specialist to try to minimize his medication.  He is no longer on baclofen  but he is on Flexeril .  He is also taking MS Contin  as well as Lyrica .  I have recommended trying to wean down the dose of Lyrica  is much as possible and to discontinue Flexeril .  Also recommended that the patient try to use Klonopin  sparingly, less than once a day if possible.  He is only taking oxycodone  1 time since his surgery.  I recommended discontinuation of this altogether.  I believe avoiding combinations of these medications will prevent future complications.  I will schedule the patient for an MRI of the abdomen to further evaluate the left renal mass.  Check CBC and CMP.  Once renal function improves, I will start the patient back on his Crestor  if his CK  levels are normal.  For the time being continue to hold losartan  until renal function has normalized

## 2024-03-28 ENCOUNTER — Encounter: Payer: Self-pay | Admitting: Family Medicine

## 2024-03-28 LAB — COMPLETE METABOLIC PANEL WITHOUT GFR
AG Ratio: 2.2 (calc) (ref 1.0–2.5)
ALT: 8 U/L — ABNORMAL LOW (ref 9–46)
AST: 14 U/L (ref 10–35)
Albumin: 3.7 g/dL (ref 3.6–5.1)
Alkaline phosphatase (APISO): 86 U/L (ref 35–144)
BUN: 15 mg/dL (ref 7–25)
CO2: 36 mmol/L — ABNORMAL HIGH (ref 20–32)
Calcium: 8.9 mg/dL (ref 8.6–10.3)
Chloride: 99 mmol/L (ref 98–110)
Creat: 1 mg/dL (ref 0.70–1.30)
Globulin: 1.7 g/dL — ABNORMAL LOW (ref 1.9–3.7)
Glucose, Bld: 110 mg/dL — ABNORMAL HIGH (ref 65–99)
Potassium: 3.9 mmol/L (ref 3.5–5.3)
Sodium: 142 mmol/L (ref 135–146)
Total Bilirubin: 0.4 mg/dL (ref 0.2–1.2)
Total Protein: 5.4 g/dL — ABNORMAL LOW (ref 6.1–8.1)

## 2024-03-28 LAB — CBC WITH DIFFERENTIAL/PLATELET
Absolute Lymphocytes: 2117 {cells}/uL (ref 850–3900)
Absolute Monocytes: 578 {cells}/uL (ref 200–950)
Basophils Absolute: 60 {cells}/uL (ref 0–200)
Basophils Relative: 0.7 %
Eosinophils Absolute: 0 {cells}/uL — ABNORMAL LOW (ref 15–500)
Eosinophils Relative: 0 %
HCT: 31.2 % — ABNORMAL LOW (ref 38.5–50.0)
Hemoglobin: 10.2 g/dL — ABNORMAL LOW (ref 13.2–17.1)
MCH: 29.1 pg (ref 27.0–33.0)
MCHC: 32.7 g/dL (ref 32.0–36.0)
MCV: 88.9 fL (ref 80.0–100.0)
MPV: 9.9 fL (ref 7.5–12.5)
Monocytes Relative: 6.8 %
Neutro Abs: 5746 {cells}/uL (ref 1500–7800)
Neutrophils Relative %: 67.6 %
Platelets: 282 10*3/uL (ref 140–400)
RBC: 3.51 10*6/uL — ABNORMAL LOW (ref 4.20–5.80)
RDW: 14.9 % (ref 11.0–15.0)
Total Lymphocyte: 24.9 %
WBC: 8.5 10*3/uL (ref 3.8–10.8)

## 2024-03-28 LAB — CK: Total CK: 54 U/L (ref 23–325)

## 2024-03-31 DIAGNOSIS — Z7982 Long term (current) use of aspirin: Secondary | ICD-10-CM | POA: Diagnosis not present

## 2024-03-31 DIAGNOSIS — Z79891 Long term (current) use of opiate analgesic: Secondary | ICD-10-CM | POA: Diagnosis not present

## 2024-03-31 DIAGNOSIS — Z556 Problems related to health literacy: Secondary | ICD-10-CM | POA: Diagnosis not present

## 2024-03-31 DIAGNOSIS — Z79890 Hormone replacement therapy (postmenopausal): Secondary | ICD-10-CM | POA: Diagnosis not present

## 2024-03-31 DIAGNOSIS — F329 Major depressive disorder, single episode, unspecified: Secondary | ICD-10-CM | POA: Diagnosis not present

## 2024-03-31 DIAGNOSIS — Z4789 Encounter for other orthopedic aftercare: Secondary | ICD-10-CM | POA: Diagnosis not present

## 2024-03-31 DIAGNOSIS — M5416 Radiculopathy, lumbar region: Secondary | ICD-10-CM | POA: Diagnosis not present

## 2024-03-31 DIAGNOSIS — M4316 Spondylolisthesis, lumbar region: Secondary | ICD-10-CM | POA: Diagnosis not present

## 2024-03-31 DIAGNOSIS — Z981 Arthrodesis status: Secondary | ICD-10-CM | POA: Diagnosis not present

## 2024-04-02 DIAGNOSIS — Z7982 Long term (current) use of aspirin: Secondary | ICD-10-CM | POA: Diagnosis not present

## 2024-04-02 DIAGNOSIS — M4316 Spondylolisthesis, lumbar region: Secondary | ICD-10-CM | POA: Diagnosis not present

## 2024-04-02 DIAGNOSIS — Z4789 Encounter for other orthopedic aftercare: Secondary | ICD-10-CM | POA: Diagnosis not present

## 2024-04-02 DIAGNOSIS — F329 Major depressive disorder, single episode, unspecified: Secondary | ICD-10-CM | POA: Diagnosis not present

## 2024-04-02 DIAGNOSIS — M5416 Radiculopathy, lumbar region: Secondary | ICD-10-CM | POA: Diagnosis not present

## 2024-04-02 DIAGNOSIS — Z79890 Hormone replacement therapy (postmenopausal): Secondary | ICD-10-CM | POA: Diagnosis not present

## 2024-04-02 DIAGNOSIS — Z79891 Long term (current) use of opiate analgesic: Secondary | ICD-10-CM | POA: Diagnosis not present

## 2024-04-02 DIAGNOSIS — Z981 Arthrodesis status: Secondary | ICD-10-CM | POA: Diagnosis not present

## 2024-04-02 DIAGNOSIS — Z556 Problems related to health literacy: Secondary | ICD-10-CM | POA: Diagnosis not present

## 2024-04-03 ENCOUNTER — Other Ambulatory Visit: Payer: Self-pay | Admitting: Family Medicine

## 2024-04-03 MED ORDER — ROSUVASTATIN CALCIUM 40 MG PO TABS: 40.0000 mg | ORAL_TABLET | Freq: Every day | ORAL | 2 refills | Status: AC

## 2024-04-03 MED ORDER — LOSARTAN POTASSIUM 50 MG PO TABS: 50.0000 mg | ORAL_TABLET | Freq: Every day | ORAL | 1 refills | Status: DC

## 2024-04-03 NOTE — Telephone Encounter (Signed)
 Prescription Request  04/03/2024  LOV: 03/27/2024  What is the name of the medication or equipment? losartan  (COZAAR ) 50 MG tablet  and rosuvastation  [employer] is requesting to authorize 100 day or 90 day refill  Have you contacted your pharmacy to request a refill? Yes   Which pharmacy would you like this sent to?  [facility] - [place], [place] - [address] [place] [address] Phone: [phone] Fax: [phone]    Patient notified that their request is being sent to the clinical staff for review and that they should receive a response within 2 business days.   Please advise at [facility] [phone]

## 2024-04-03 NOTE — Telephone Encounter (Signed)
 Requested Prescriptions  Pending Prescriptions Disp Refills   rosuvastatin  (CRESTOR ) 40 MG tablet 100 tablet 2    Sig: Take 1 tablet (40 mg total) by mouth daily.     Cardiovascular:  Antilipid - Statins 2 Failed - 04/03/2024 10:35 AM      Failed - Lipid Panel in normal range within the last 12 months    Cholesterol  Date Value Ref Range Status  03/20/2024 73 0 - 200 mg/dL Final   LDL Cholesterol (Calc)  Date Value Ref Range Status  09/28/2023 70 mg/dL (calc) Final    Comment:    Reference range: <100 . Desirable range <100 mg/dL for primary prevention;   <70 mg/dL for patients with CHD or diabetic patients  with > or = 2 CHD risk factors. Aaron Aas LDL-C is now calculated using the Martin-Hopkins  calculation, which is a validated novel method providing  better accuracy than the Friedewald equation in the  estimation of LDL-C.  Melinda Sprawls et al. Erroll Heard. 9629;528(41): 2061-2068  (http://education.[facility].com/faq/FAQ164)    LDL Cholesterol  Date Value Ref Range Status  03/20/2024 31 0 - 99 mg/dL Final    Comment:           Total Cholesterol/HDL:CHD Risk Coronary Heart Disease Risk Table                     Men   Women  1/2 Average Risk   3.4   3.3  Average Risk       5.0   4.4  2 X Average Risk   9.6   7.1  3 X Average Risk  23.4   11.0        Use the calculated Patient Ratio above and the CHD Risk Table to determine the patient's CHD Risk.        ATP III CLASSIFICATION (LDL):  <100     mg/dL   Optimal  324-401  mg/dL   Near or Above                    Optimal  130-159  mg/dL   Borderline  027-253  mg/dL   High  >664     mg/dL   Very High Performed at [facility], [address], [place], [place] [address]    HDL  Date Value Ref Range Status  03/20/2024 20 (L) >40 mg/dL Final   Triglycerides  Date Value Ref Range Status  03/20/2024 111 <150 mg/dL Final         Passed - Cr in normal range and within 360 days    Creat  Date Value Ref Range Status   03/27/2024 1.00 0.70 - 1.30 mg/dL Final         Passed - Patient is not pregnant      Passed - Valid encounter within last 12 months    Recent Outpatient Visits           1 week ago Left kidney mass   [place] [facility] Austine Lefort, MD   1 month ago Inflammation of soft tissue   [place] [facility] Jenelle Mis, FNP   3 months ago Viral URI with cough   [place] [facility] Jenelle Mis, FNP   6 months ago ASCVD (arteriosclerotic cardiovascular disease)   [place] [facility] Austine Lefort, MD   8 months ago Great toe pain, right  [place] [facility] Pickard, Cisco Crest, MD               losartan  (COZAAR ) 50 MG tablet 100 tablet 1    Sig: Take 1 tablet (50 mg total) by mouth daily.     Cardiovascular:  Angiotensin Receptor Blockers Failed - 04/03/2024 10:35 AM      Failed - Last BP in normal range    BP Readings from Last 1 Encounters:  03/27/24 (!) 144/74         Failed - Valid encounter within last 6 months    Recent Outpatient Visits           1 week ago Left kidney mass   [place] [facility] Cheril Cork, Cisco Crest, MD   1 month ago Inflammation of soft tissue   [place] [facility] Jenelle Mis, FNP   3 months ago Viral URI with cough   [place] [facility] Jenelle Mis, FNP   6 months ago ASCVD (arteriosclerotic cardiovascular disease)   [place] [facility] Austine Lefort, MD   8 months ago Great toe pain, right   [place] [facility] Pickard, Cisco Crest, MD              Passed - Cr in normal range and within 180 days    Creat  Date Value Ref Range Status  03/27/2024 1.00 0.70 - 1.30 mg/dL Final         Passed - K in normal range and within 180 days    Potassium  Date Value Ref Range Status  03/27/2024 3.9 3.5 - 5.3 mmol/L Final          Passed - Patient is not pregnant

## 2024-04-05 DIAGNOSIS — J449 Chronic obstructive pulmonary disease, unspecified: Secondary | ICD-10-CM | POA: Diagnosis not present

## 2024-04-06 ENCOUNTER — Encounter: Payer: Self-pay | Admitting: Family Medicine

## 2024-04-09 DIAGNOSIS — E669 Obesity, unspecified: Secondary | ICD-10-CM | POA: Diagnosis not present

## 2024-04-09 DIAGNOSIS — Z4789 Encounter for other orthopedic aftercare: Secondary | ICD-10-CM | POA: Diagnosis not present

## 2024-04-09 DIAGNOSIS — Z7982 Long term (current) use of aspirin: Secondary | ICD-10-CM | POA: Diagnosis not present

## 2024-04-09 DIAGNOSIS — M5416 Radiculopathy, lumbar region: Secondary | ICD-10-CM | POA: Diagnosis not present

## 2024-04-09 DIAGNOSIS — Z79890 Hormone replacement therapy (postmenopausal): Secondary | ICD-10-CM | POA: Diagnosis not present

## 2024-04-09 DIAGNOSIS — Z6834 Body mass index (BMI) 34.0-34.9, adult: Secondary | ICD-10-CM | POA: Diagnosis not present

## 2024-04-09 DIAGNOSIS — M4316 Spondylolisthesis, lumbar region: Secondary | ICD-10-CM | POA: Diagnosis not present

## 2024-04-09 DIAGNOSIS — Z981 Arthrodesis status: Secondary | ICD-10-CM | POA: Diagnosis not present

## 2024-04-09 DIAGNOSIS — J189 Pneumonia, unspecified organism: Secondary | ICD-10-CM | POA: Diagnosis not present

## 2024-04-09 DIAGNOSIS — F329 Major depressive disorder, single episode, unspecified: Secondary | ICD-10-CM | POA: Diagnosis not present

## 2024-04-09 DIAGNOSIS — J440 Chronic obstructive pulmonary disease with acute lower respiratory infection: Secondary | ICD-10-CM | POA: Diagnosis not present

## 2024-04-09 DIAGNOSIS — Z556 Problems related to health literacy: Secondary | ICD-10-CM | POA: Diagnosis not present

## 2024-04-09 DIAGNOSIS — R030 Elevated blood-pressure reading, without diagnosis of hypertension: Secondary | ICD-10-CM | POA: Diagnosis not present

## 2024-04-09 DIAGNOSIS — Z79891 Long term (current) use of opiate analgesic: Secondary | ICD-10-CM | POA: Diagnosis not present

## 2024-04-11 ENCOUNTER — Ambulatory Visit: Payer: Self-pay

## 2024-04-11 DIAGNOSIS — F329 Major depressive disorder, single episode, unspecified: Secondary | ICD-10-CM | POA: Diagnosis not present

## 2024-04-11 DIAGNOSIS — Z7982 Long term (current) use of aspirin: Secondary | ICD-10-CM | POA: Diagnosis not present

## 2024-04-11 DIAGNOSIS — Z79890 Hormone replacement therapy (postmenopausal): Secondary | ICD-10-CM | POA: Diagnosis not present

## 2024-04-11 DIAGNOSIS — Z556 Problems related to health literacy: Secondary | ICD-10-CM | POA: Diagnosis not present

## 2024-04-11 DIAGNOSIS — Z79891 Long term (current) use of opiate analgesic: Secondary | ICD-10-CM | POA: Diagnosis not present

## 2024-04-11 DIAGNOSIS — M4316 Spondylolisthesis, lumbar region: Secondary | ICD-10-CM | POA: Diagnosis not present

## 2024-04-11 DIAGNOSIS — Z4789 Encounter for other orthopedic aftercare: Secondary | ICD-10-CM | POA: Diagnosis not present

## 2024-04-11 DIAGNOSIS — Z981 Arthrodesis status: Secondary | ICD-10-CM | POA: Diagnosis not present

## 2024-04-11 DIAGNOSIS — M5416 Radiculopathy, lumbar region: Secondary | ICD-10-CM | POA: Diagnosis not present

## 2024-04-11 NOTE — Telephone Encounter (Signed)
Second attempt to reach pt. Left message to call back. 

## 2024-04-11 NOTE — Telephone Encounter (Signed)
 Pt went to urgent care on Wednesday for congestion it was determined that he had pnemonia. Prescribed him two different antibiotics and two different steroids. Symptoms are improving   Left message to call back.

## 2024-04-11 NOTE — Telephone Encounter (Signed)
Third attempt to reach pt. Left message.

## 2024-04-14 ENCOUNTER — Telehealth: Payer: Self-pay

## 2024-04-14 NOTE — Telephone Encounter (Signed)
 Copied from CRM [id number]. Topic: Clinical - Medication Prior Auth >> Apr 14, 2024 12:35 PM Felizardo Hotter wrote: Reason for CRM: Received call from [facility] Radiology per [facility] ph:[phone] ext. [phone] need order location change from [facility] to [facility]. Pt is scheduled for Apr 18, 2024.

## 2024-04-15 ENCOUNTER — Other Ambulatory Visit: Payer: Self-pay | Admitting: Family Medicine

## 2024-04-15 DIAGNOSIS — N2889 Other specified disorders of kidney and ureter: Secondary | ICD-10-CM

## 2024-04-15 DIAGNOSIS — M4316 Spondylolisthesis, lumbar region: Secondary | ICD-10-CM | POA: Diagnosis not present

## 2024-04-15 DIAGNOSIS — Z79890 Hormone replacement therapy (postmenopausal): Secondary | ICD-10-CM | POA: Diagnosis not present

## 2024-04-15 DIAGNOSIS — M47816 Spondylosis without myelopathy or radiculopathy, lumbar region: Secondary | ICD-10-CM | POA: Diagnosis not present

## 2024-04-15 DIAGNOSIS — M62830 Muscle spasm of back: Secondary | ICD-10-CM | POA: Diagnosis not present

## 2024-04-15 DIAGNOSIS — Z79891 Long term (current) use of opiate analgesic: Secondary | ICD-10-CM | POA: Diagnosis not present

## 2024-04-15 DIAGNOSIS — Z556 Problems related to health literacy: Secondary | ICD-10-CM | POA: Diagnosis not present

## 2024-04-15 DIAGNOSIS — M5416 Radiculopathy, lumbar region: Secondary | ICD-10-CM | POA: Diagnosis not present

## 2024-04-15 DIAGNOSIS — M47812 Spondylosis without myelopathy or radiculopathy, cervical region: Secondary | ICD-10-CM | POA: Diagnosis not present

## 2024-04-15 DIAGNOSIS — Z981 Arthrodesis status: Secondary | ICD-10-CM | POA: Diagnosis not present

## 2024-04-15 DIAGNOSIS — Z7982 Long term (current) use of aspirin: Secondary | ICD-10-CM | POA: Diagnosis not present

## 2024-04-15 DIAGNOSIS — Z4789 Encounter for other orthopedic aftercare: Secondary | ICD-10-CM | POA: Diagnosis not present

## 2024-04-15 DIAGNOSIS — F329 Major depressive disorder, single episode, unspecified: Secondary | ICD-10-CM | POA: Diagnosis not present

## 2024-04-15 DIAGNOSIS — G894 Chronic pain syndrome: Secondary | ICD-10-CM | POA: Diagnosis not present

## 2024-04-17 ENCOUNTER — Other Ambulatory Visit: Payer: Self-pay | Admitting: Family Medicine

## 2024-04-17 ENCOUNTER — Encounter: Payer: Self-pay | Admitting: Family Medicine

## 2024-04-17 DIAGNOSIS — Z4789 Encounter for other orthopedic aftercare: Secondary | ICD-10-CM | POA: Diagnosis not present

## 2024-04-17 DIAGNOSIS — M5416 Radiculopathy, lumbar region: Secondary | ICD-10-CM | POA: Diagnosis not present

## 2024-04-17 DIAGNOSIS — M4316 Spondylolisthesis, lumbar region: Secondary | ICD-10-CM | POA: Diagnosis not present

## 2024-04-17 DIAGNOSIS — Z79891 Long term (current) use of opiate analgesic: Secondary | ICD-10-CM | POA: Diagnosis not present

## 2024-04-17 DIAGNOSIS — Z556 Problems related to health literacy: Secondary | ICD-10-CM | POA: Diagnosis not present

## 2024-04-17 DIAGNOSIS — Z7982 Long term (current) use of aspirin: Secondary | ICD-10-CM | POA: Diagnosis not present

## 2024-04-17 DIAGNOSIS — F329 Major depressive disorder, single episode, unspecified: Secondary | ICD-10-CM | POA: Diagnosis not present

## 2024-04-17 DIAGNOSIS — Z79890 Hormone replacement therapy (postmenopausal): Secondary | ICD-10-CM | POA: Diagnosis not present

## 2024-04-17 DIAGNOSIS — Z981 Arthrodesis status: Secondary | ICD-10-CM | POA: Diagnosis not present

## 2024-04-17 MED ORDER — CARVEDILOL 6.25 MG PO TABS: 6.2500 mg | ORAL_TABLET | Freq: Two times a day (BID) | ORAL | 2 refills | Status: DC

## 2024-04-17 MED ORDER — AMLODIPINE BESYLATE 10 MG PO TABS: 10.0000 mg | ORAL_TABLET | Freq: Every day | ORAL | 1 refills | Status: DC

## 2024-04-17 MED ORDER — LOSARTAN POTASSIUM 50 MG PO TABS: 50.0000 mg | ORAL_TABLET | Freq: Every day | ORAL | 1 refills | Status: AC

## 2024-04-18 ENCOUNTER — Ambulatory Visit (HOSPITAL_COMMUNITY)
Admission: RE | Admit: 2024-04-18 | Discharge: 2024-04-18 | Disposition: A | Discharge status: Home / Self Care | Source: Ambulatory Visit | Attending: Family Medicine | Admitting: Family Medicine

## 2024-04-18 DIAGNOSIS — N289 Disorder of kidney and ureter, unspecified: Secondary | ICD-10-CM | POA: Diagnosis not present

## 2024-04-18 DIAGNOSIS — I714 Abdominal aortic aneurysm, without rupture, unspecified: Secondary | ICD-10-CM | POA: Diagnosis not present

## 2024-04-18 DIAGNOSIS — Z9049 Acquired absence of other specified parts of digestive tract: Secondary | ICD-10-CM | POA: Diagnosis not present

## 2024-04-18 DIAGNOSIS — I700 Atherosclerosis of aorta: Secondary | ICD-10-CM | POA: Diagnosis not present

## 2024-04-18 DIAGNOSIS — N2889 Other specified disorders of kidney and ureter: Secondary | ICD-10-CM | POA: Insufficient documentation

## 2024-04-18 MED ORDER — GADOBUTROL 1 MMOL/ML IV SOLN
10.0000 mL | Freq: Once | INTRAVENOUS | Status: AC | PRN
Start: 2024-04-18 — End: 2024-04-18
  Administered 2024-04-18: 10 mL via INTRAVENOUS

## 2024-04-21 ENCOUNTER — Ambulatory Visit: Payer: Self-pay | Admitting: Family Medicine

## 2024-04-22 DIAGNOSIS — M4316 Spondylolisthesis, lumbar region: Secondary | ICD-10-CM | POA: Diagnosis not present

## 2024-04-22 DIAGNOSIS — M5126 Other intervertebral disc displacement, lumbar region: Secondary | ICD-10-CM | POA: Diagnosis not present

## 2024-04-24 DIAGNOSIS — Z556 Problems related to health literacy: Secondary | ICD-10-CM | POA: Diagnosis not present

## 2024-04-24 DIAGNOSIS — Z4789 Encounter for other orthopedic aftercare: Secondary | ICD-10-CM | POA: Diagnosis not present

## 2024-04-24 DIAGNOSIS — Z79890 Hormone replacement therapy (postmenopausal): Secondary | ICD-10-CM | POA: Diagnosis not present

## 2024-04-24 DIAGNOSIS — Z981 Arthrodesis status: Secondary | ICD-10-CM | POA: Diagnosis not present

## 2024-04-24 DIAGNOSIS — M5416 Radiculopathy, lumbar region: Secondary | ICD-10-CM | POA: Diagnosis not present

## 2024-04-24 DIAGNOSIS — Z79891 Long term (current) use of opiate analgesic: Secondary | ICD-10-CM | POA: Diagnosis not present

## 2024-04-24 DIAGNOSIS — M4316 Spondylolisthesis, lumbar region: Secondary | ICD-10-CM | POA: Diagnosis not present

## 2024-04-24 DIAGNOSIS — Z7982 Long term (current) use of aspirin: Secondary | ICD-10-CM | POA: Diagnosis not present

## 2024-04-24 DIAGNOSIS — F329 Major depressive disorder, single episode, unspecified: Secondary | ICD-10-CM | POA: Diagnosis not present

## 2024-05-06 ENCOUNTER — Other Ambulatory Visit: Payer: Self-pay | Admitting: Family Medicine

## 2024-05-06 DIAGNOSIS — J449 Chronic obstructive pulmonary disease, unspecified: Secondary | ICD-10-CM | POA: Diagnosis not present

## 2024-05-09 ENCOUNTER — Telehealth: Payer: Self-pay | Admitting: *Deleted

## 2024-05-09 NOTE — Telephone Encounter (Unsigned)
 Copied from CRM [id number]. Topic: General - Directions >> May 09, 2024 11:19 AM [name] wrote: Patient/patient representative is calling for directions.  Tony Campos is calling from [facility] with concerns about patients endurance while walking oxygen  going below 90%  to 80/70%, ckd the patients llungs and there is weezing in the right lung more than left patient was diagnosed with pneumonia in left lung completed antibiotics.

## 2024-05-15 DIAGNOSIS — M62830 Muscle spasm of back: Secondary | ICD-10-CM | POA: Diagnosis not present

## 2024-05-15 DIAGNOSIS — M47812 Spondylosis without myelopathy or radiculopathy, cervical region: Secondary | ICD-10-CM | POA: Diagnosis not present

## 2024-05-15 DIAGNOSIS — G894 Chronic pain syndrome: Secondary | ICD-10-CM | POA: Diagnosis not present

## 2024-05-15 DIAGNOSIS — M47816 Spondylosis without myelopathy or radiculopathy, lumbar region: Secondary | ICD-10-CM | POA: Diagnosis not present

## 2024-05-19 DIAGNOSIS — M5416 Radiculopathy, lumbar region: Secondary | ICD-10-CM | POA: Diagnosis not present

## 2024-05-19 DIAGNOSIS — M4316 Spondylolisthesis, lumbar region: Secondary | ICD-10-CM | POA: Diagnosis not present

## 2024-05-19 DIAGNOSIS — Z981 Arthrodesis status: Secondary | ICD-10-CM | POA: Diagnosis not present

## 2024-05-19 DIAGNOSIS — Z7982 Long term (current) use of aspirin: Secondary | ICD-10-CM | POA: Diagnosis not present

## 2024-05-19 DIAGNOSIS — Z4789 Encounter for other orthopedic aftercare: Secondary | ICD-10-CM | POA: Diagnosis not present

## 2024-05-19 DIAGNOSIS — F329 Major depressive disorder, single episode, unspecified: Secondary | ICD-10-CM | POA: Diagnosis not present

## 2024-05-19 DIAGNOSIS — Z79890 Hormone replacement therapy (postmenopausal): Secondary | ICD-10-CM | POA: Diagnosis not present

## 2024-05-19 DIAGNOSIS — Z79891 Long term (current) use of opiate analgesic: Secondary | ICD-10-CM | POA: Diagnosis not present

## 2024-05-19 DIAGNOSIS — Z556 Problems related to health literacy: Secondary | ICD-10-CM | POA: Diagnosis not present

## 2024-06-05 DIAGNOSIS — J449 Chronic obstructive pulmonary disease, unspecified: Secondary | ICD-10-CM | POA: Diagnosis not present

## 2024-06-09 DIAGNOSIS — M47812 Spondylosis without myelopathy or radiculopathy, cervical region: Secondary | ICD-10-CM | POA: Diagnosis not present

## 2024-06-09 DIAGNOSIS — G894 Chronic pain syndrome: Secondary | ICD-10-CM | POA: Diagnosis not present

## 2024-06-09 DIAGNOSIS — M47816 Spondylosis without myelopathy or radiculopathy, lumbar region: Secondary | ICD-10-CM | POA: Diagnosis not present

## 2024-06-09 DIAGNOSIS — M62830 Muscle spasm of back: Secondary | ICD-10-CM | POA: Diagnosis not present

## 2024-06-15 ENCOUNTER — Encounter: Payer: Self-pay | Admitting: Family Medicine

## 2024-06-16 ENCOUNTER — Other Ambulatory Visit: Payer: Self-pay

## 2024-06-16 MED ORDER — CARVEDILOL 6.25 MG PO TABS: 6.2500 mg | ORAL_TABLET | Freq: Two times a day (BID) | ORAL | 2 refills | Status: AC

## 2024-06-16 MED ORDER — AMLODIPINE BESYLATE 10 MG PO TABS: 10.0000 mg | ORAL_TABLET | Freq: Every day | ORAL | 1 refills | Status: DC

## 2024-06-17 DIAGNOSIS — F3181 Bipolar II disorder: Secondary | ICD-10-CM | POA: Diagnosis not present

## 2024-06-19 ENCOUNTER — Encounter: Payer: Self-pay | Admitting: Pharmacist

## 2024-06-19 NOTE — Progress Notes (Unsigned)
 Pharmacy Quality Measure Review  This patient is appearing on a report for being at risk of failing the adherence measure for cholesterol (statin) and hypertension (ACEi/ARB) medications this calendar year.   Medication: losartan  50 mg daily Last fill date: 6/20 for 30 day supply  Medication: rosuvastatin  40 mg daily Last fill date: 6/20 for 30 day supply  Left voicemail for patient to discuss if he would like to get 3 month supplies at a different pharmacy - [facility] will historically only dispense 30 day supplies. Will send MyChart message.   Catie [facility], PharmD, [facility] Clinical Pharmacist [phone]

## 2024-06-25 ENCOUNTER — Ambulatory Visit

## 2024-06-25 VITALS — Ht 72.0 in | Wt 265.0 lb

## 2024-06-25 DIAGNOSIS — Z0000 Encounter for general adult medical examination without abnormal findings: Secondary | ICD-10-CM | POA: Diagnosis not present

## 2024-06-25 NOTE — Progress Notes (Signed)
 Subjective:   Tony Campos is a 58 y.o. who presents for a Medicare Wellness preventive visit.  As a reminder, Annual Wellness Visits don't include a physical exam, and some assessments may be limited, especially if this visit is performed virtually. We may recommend an in-person follow-up visit with your provider if needed.  Visit Complete: Virtual I connected with  Tony Campos on 06/25/24 by a audio enabled telemedicine application and verified that I am speaking with the correct person using two identifiers.  Patient Location: Home  Provider Location: Home Office  I discussed the limitations of evaluation and management by telemedicine. The patient expressed understanding and agreed to proceed.  Vital Signs: Because this visit was a virtual/telehealth visit, some criteria may be missing or patient reported. Any vitals not documented were not able to be obtained and vitals that have been documented are patient reported.  VideoDeclined- This patient declined Librarian, academic. Therefore the visit was completed with audio only.  Persons Participating in Visit: Patient.  AWV Questionnaire: No: Patient Medicare AWV questionnaire was not completed prior to this visit.  Cardiac Risk Factors include: advanced age (>28men, >70 women);dyslipidemia;male gender;smoking/ tobacco exposure     Objective:    Today's Vitals   06/25/24 1225  Weight: 265 lb (120.2 kg)  Height: 6' (1.829 m)   Body mass index is 35.94 kg/m.     06/25/2024   12:39 PM 03/12/2024    6:56 AM 03/06/2024    1:38 PM 12/05/2022    2:36 PM 07/20/2021    6:50 AM 07/12/2021    4:40 PM 04/06/2021    8:29 AM  Advanced Directives  Does Patient Have a Medical Advance Directive? No No No No No No Yes  Does patient want to make changes to medical advance directive?       No - Patient declined  Would patient like information on creating a medical advance directive? Yes  (MAU/Ambulatory/Procedural Areas - Information given) No - Patient declined No - Patient declined No - Patient declined No - Guardian declined No - Patient declined     Current Medications (verified) Outpatient Encounter Medications as of 06/25/2024  Medication Sig   Alpha-Lipoic Acid 600 MG CAPS Take 1 capsule by mouth 2 (two) times daily. Takes with Lyrica    amLODipine  (NORVASC ) 10 MG tablet Take 1 tablet (10 mg total) by mouth daily.   aspirin EC 81 MG tablet Take 81 mg by mouth daily.   brexpiprazole  (REXULTI ) 2 MG TABS tablet Take 2 mg by mouth daily.   carvedilol  (COREG ) 6.25 MG tablet Take 1 tablet (6.25 mg total) by mouth 2 (two) times daily with a meal.   clonazePAM  (KLONOPIN ) 0.5 MG tablet Take 1 tablet (0.5 mg total) by mouth 2 (two) times daily.   cyanocobalamin  1000 MCG tablet Take 1 tablet (1,000 mcg total) by mouth daily.   cyclobenzaprine  (FLEXERIL ) 10 MG tablet Take 1 tablet (10 mg total) by mouth 3 (three) times daily as needed for muscle spasms.   docusate sodium  (COLACE) 100 MG capsule Take 1 capsule (100 mg total) by mouth 2 (two) times daily.   DULoxetine  (CYMBALTA ) 60 MG capsule Take 60 mg by mouth daily.   ferrous sulfate  325 (65 FE) MG tablet Take 325 mg by mouth daily.   lamoTRIgine  (LAMICTAL ) 200 MG tablet Take 200 mg by mouth 2 (two) times daily.   levothyroxine  (SYNTHROID ) 125 MCG tablet take 1 tablet (125 microgram total) by mouth daily before breakfast.  losartan  (COZAAR ) 50 MG tablet Take 1 tablet (50 mg total) by mouth daily.   morphine  (MS CONTIN ) 15 MG 12 hr tablet Take 15 mg by mouth 3 (three) times daily.   NARCAN 4 MG/0.1ML LIQD nasal spray kit Place 1 spray into the nose once.   nicotine  (NICODERM CQ  - DOSED IN MG/24 HOURS) 21 mg/24hr patch Place 1 patch (21 mg total) onto the skin daily.   oxyCODONE -acetaminophen  (PERCOCET) 5-325 MG tablet Take 1 tablet by mouth every 6 (six) hours as needed for severe pain (pain score 7-10) (Postoeprative pain).    pantoprazole  (PROTONIX ) 40 MG tablet TAKE (1) TABLET TWICE DAILY.   pregabalin  (LYRICA ) 150 MG capsule Take 150 mg by mouth 2 (two) times daily.   rosuvastatin  (CRESTOR ) 40 MG tablet Take 1 tablet (40 mg total) by mouth daily.   senna-docusate (SENOKOT-S) 8.6-50 MG tablet Take 2 tablets by mouth 2 (two) times daily between meals as needed for mild constipation.   Solriamfetol  HCl (SUNOSI ) 150 MG TABS Take 150 mg by mouth daily.   tamsulosin  (FLOMAX ) 0.4 MG CAPS capsule Take 1 capsule (0.4 mg total) by mouth daily after supper.   valACYclovir  (VALTREX ) 500 MG tablet take 1 tablet (500 milligram total) by mouth daily.   [DISCONTINUED] esomeprazole (NEXIUM) 40 MG capsule Take 40 mg by mouth daily before breakfast.   No facility-administered encounter medications on file as of 06/25/2024.    Allergies (verified) Patient has no known allergies.   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   Sleep apnea 12/2015   No cpap, has Inspire   Smoker    Spondylosis    Testosterone  deficiency    Past Surgical History:  Procedure Laterality Date   APPENDECTOMY  12/2018   at [facility]   CHOLECYSTECTOMY  2007   CLOSED REDUCTION MANDIBLE N/A 03/09/2018   Procedure: CLOSED REDUCTION MANDIBULAR MMF;  Surgeon: Mable Lenis, MD;  [facility];  Service: ENT;  Laterality: N/A;    COLONOSCOPY  07/26/2020   10/22/2018   DRUG INDUCED ENDOSCOPY N/A 04/06/2021   Procedure: DRUG INDUCED ENDOSCOPY;  Surgeon: Carlie Clark, MD;  Location: [facility];  Service: ENT;  Laterality: N/A;   IMPLANTATION OF HYPOGLOSSAL NERVE STIMULATOR Right 07/20/2021   Procedure: IMPLANTATION OF HYPOGLOSSAL NERVE STIMULATOR;  Surgeon: Carlie Clark, MD;  Location: [facility];  Service: ENT;  Laterality: Right;   KNEE SURGERY     3 on right knee and 1 on left knee   LAPAROSCOPIC APPENDECTOMY N/A 07/25/2018   Procedure: APPENDECTOMY LAPAROSCOPIC;  Surgeon: Mavis Anes, MD;  Location: [facility];  Service: General;  Laterality: N/A;   MANDIBULAR HARDWARE REMOVAL N/A 05/06/2018   Procedure: REMOVAL OF MMF HARDWARE;  Surgeon:  Mable Lenis, MD;  Location: [facility];  Service: ENT;  Laterality: N/A;   NASAL SEPTUM SURGERY     NASAL SINUS SURGERY     POLYPECTOMY     right sfa recanalization and stenting  Right 01/12/2016   [facility]ERY     UPPER GASTROINTESTINAL ENDOSCOPY  07/26/2020   WRIST SURGERY     right and left   Family History  Problem Relation Age of Onset   Other Father        killed in [place]   Cancer Maternal Grandfather        stomach   Stomach cancer Maternal Grandfather    Colon cancer Neg Hx    Colitis Neg Hx    Esophageal cancer Neg Hx    Rectal cancer Neg Hx    Social History   Socioeconomic History   Marital status: Married    Spouse name: Not on file   Number of children: 0   Years of education: Not on file   Highest education level: Associate degree: occupational, Scientist, product/process development, or vocational program  Occupational History   Not on file  Tobacco Use   Smoking status: Every Day    Current packs/day: 0.25    Average packs/day: 0.3 packs/day for 0.3 years (0.1 ttl pk-yrs)    Types: Cigarettes    Start date: 02/2024    Passive exposure: Current   Smokeless tobacco: Never   Tobacco comments:    Less than  1/2 pack per day 01/22/23 PAP  Vaping Use   Vaping status: Former  Substance and Sexual Activity   Alcohol use: No   Drug use: No   Sexual activity: Yes  Other Topics Concern   Not on file  Social History Narrative   Not on file   Social Drivers of Health   Financial Resource Strain: Low Risk  (06/25/2024)   Overall Financial Resource Strain (CARDIA)    Difficulty of Paying Living Expenses: Not very hard  Food Insecurity: No Food Insecurity (06/25/2024)   Hunger Vital Sign    Worried About Running Out of Food in the Last Year: Never true    Ran Out of Food in the Last Year: Never true  Transportation Needs: No Transportation Needs (06/25/2024)   PRAPARE - Administrator, Civil Service (Medical): No    Lack of Transportation (Non-Medical): No  Physical Activity: Inactive (06/25/2024)   Exercise Vital Sign    Days of Exercise per Week: 0 days    Minutes of Exercise per Session: 0 min  Stress: Stress Concern Present (06/25/2024)   [employer] of Occupational Health - Occupational Stress Questionnaire    Feeling of Stress: To some extent  Social Connections: Unknown (06/25/2024)   Social Connection and Isolation Panel    Frequency of Communication with Friends and Family: Once a week    Frequency of Social Gatherings with Friends and Family: Patient declined    Attends Religious Services: 1 to 4 times per year    Active Member of [employer] or Organizations: No    Attends Banker Meetings: Never    Marital Status: Married    Tobacco Counseling Ready to quit: Not Answered Counseling given: Not Answered Tobacco comments: Less than 1/2 pack per day 01/22/23 PAP    Clinical Intake:  Pre-visit preparation completed: Yes  Diabetes: No  Lab Results  Component Value Date   HGBA1C 5.0 03/18/2024   HGBA1C 5.4 10/18/2015     How  often do you need to have someone help you when you read instructions, pamphlets, or other written materials from your doctor  or pharmacy?: 1 - Never  Interpreter Needed?: No  Information entered by :: Charmaine Bloodgood LPN   Activities of Daily Living     06/25/2024   12:26 PM 03/22/2024    9:41 AM  In your present state of health, do you have any difficulty performing the following activities:  Hearing? 0   Vision? 0   Difficulty concentrating or making decisions? 0   Walking or climbing stairs? 0   Dressing or bathing? 0   Doing errands, shopping? 0 0  Preparing Food and eating ? N   Using the Toilet? N   In the past six months, have you accidently leaked urine? N   Do you have problems with loss of bowel control? N   Managing your Medications? N   Managing your Finances? N   Housekeeping or managing your Housekeeping? N     Patient Care Team: Duanne Butler DASEN, MD as PCP - General (Family Medicine) Alvan Dorn FALCON, MD as PCP - Cardiology (Cardiology) Nicholaus Sherlean CROME, [facility] (Inactive) as Pharmacist (Pharmacist) [facility] Cozetta Back, MD as Consulting Physician (Physical Medicine and Rehabilitation) Darden Planas, NP as Nurse Practitioner (Psychiatry) Neysa Reggy BIRCH, MD as Consulting Physician (Pulmonary Disease)  I have updated your Care Teams any recent Medical Services you may have received from other providers in the past year.     Assessment:   This is a routine wellness examination for [place].  Hearing/Vision screen Hearing Screening - Comments:: Denies hearing difficulties   Vision Screening - Comments:: Wears rx glasses - up to date with routine eye exams with [facility]   Goals Addressed             This Visit's Progress    Increase physical activity   On track      Depression Screen     06/25/2024   12:29 PM 03/04/2024   11:56 AM 09/28/2023    3:33 PM 03/22/2023    2:58 PM 12/05/2022    2:34 PM 04/14/2022    9:07 AM 03/31/2021    9:28 AM  PHQ 2/9 Scores  PHQ - 2 Score 2 2 3 1 3 3 5   PHQ- 9 Score 12 14 17 12 6 6 21     Fall Risk      06/25/2024   12:39 PM 03/04/2024   11:58 AM 09/28/2023    3:33 PM 03/22/2023    2:57 PM 12/05/2022    2:33 PM  Fall Risk   Falls in the past year? 0 1 0 1 0  Number falls in past yr: 0 0 0 1 0  Injury with Fall? 0 0 0 0 0  Risk for fall due to : Impaired mobility No Fall Risks No Fall Risks  Impaired mobility;Impaired balance/gait;Medication side effect  Follow up Falls prevention discussed;Education provided;Falls evaluation completed Falls evaluation completed Falls prevention discussed  Falls evaluation completed;Education provided;Falls prevention discussed      Data saved with a previous flowsheet row definition    MEDICARE RISK AT HOME:  Medicare Risk at Home Any stairs in or around the home?: No If so, are there any without handrails?: No Home free of loose throw rugs in walkways, pet beds, electrical cords, etc?: Yes Adequate lighting in your home to reduce risk of falls?: Yes Life alert?: No Use of a cane, walker or w/c?:  No Grab bars in the bathroom?: Yes Shower chair or bench in shower?: No Elevated toilet seat or a handicapped toilet?: No  TIMED UP AND GO:  Was the test performed?  No  Cognitive Function: Impaired: Patient has current diagnosis of cognitive impairment.      04/18/2016   10:41 AM  Montreal Cognitive Assessment   Visuospatial/ Executive (0/5) 5  Naming (0/3) 3  Attention: Read list of digits (0/2) 1  Attention: Read list of letters (0/1) 1  Attention: Serial 7 subtraction starting at 100 (0/3) 3  Language: Repeat phrase (0/2) 2  Language : Fluency (0/1) 1  Abstraction (0/2) 2  Delayed Recall (0/5) 2  Orientation (0/6) 6  Total 26  Adjusted Score (based on education) 26      12/05/2022    2:37 PM  6CIT Screen  What Year? 0 points  What month? 0 points  What time? 0 points  Count back from 20 0 points  Months in reverse 2 points  Repeat phrase 2 points  Total Score 4 points    Immunizations Immunization History  Administered Date(s)  Administered   Influenza Whole 09/07/2008, 09/16/2009, 09/23/2010, 09/04/2012   Influenza,inj,Quad PF,6+ Mos 10/02/2013, 12/24/2018, 08/26/2019, 08/11/2022   Influenza-Unspecified 09/03/2015   Moderna Sars-Covid-2 Vaccination 02/26/2020, 03/25/2020, 10/14/2020, 03/26/2021   PNEUMOCOCCAL CONJUGATE-20 08/11/2022   Pneumococcal Polysaccharide-23 11/12/2013, 06/23/2021   Td 04/06/2009   Tdap 04/06/2009, 12/24/2018   Zoster Recombinant(Shingrix ) 06/23/2021, 08/05/2021    Screening Tests Health Maintenance  Topic Date Due   Hepatitis B Vaccines (1 of 3 - 19+ 3-dose series) Never done   COVID-19 Vaccine (5 - 2024-25 season) 07/29/2023   Colonoscopy  09/27/2024 (Originally 07/26/2022)   Hepatitis C Screening  09/27/2024 (Originally [birth year]1986)   INFLUENZA VACCINE  06/27/2024   Medicare Annual Wellness (AWV)  06/25/2025   DTaP/Tdap/Td (4 - Td or Tdap) 12/24/2028   Pneumococcal Vaccine 46-56 Years old  Completed   HIV Screening  Completed   Zoster Vaccines- Shingrix   Completed   HPV VACCINES  Aged Out   Meningococcal B Vaccine  Aged Out   Lung Cancer Screening  Discontinued    Health Maintenance  Health Maintenance Due  Topic Date Due   Hepatitis B Vaccines (1 of 3 - 19+ 3-dose series) Never done   COVID-19 Vaccine (5 - 2024-25 season) 07/29/2023   Health Maintenance Items Addressed: Patient would like to hold on colonoscopy at this time   Additional Screening:  Vision Screening: Recommended annual ophthalmology exams for early detection of glaucoma and other disorders of the eye. Would you like a referral to an eye doctor? No    Dental Screening: Recommended annual dental exams for proper oral hygiene  Community Resource Referral / Chronic Care Management: CRR required this visit?  No   CCM required this visit?  No   Plan:    I have personally reviewed and noted the following in the patient's chart:   Medical and social history Use of alcohol, tobacco or illicit drugs   Current medications and supplements including opioid prescriptions. Patient is currently taking opioid prescriptions. Information provided to patient regarding non-opioid alternatives. Patient advised to discuss non-opioid treatment plan with their provider. Functional ability and status Nutritional status Physical activity Advanced directives List of other physicians Hospitalizations, surgeries, and ER visits in previous 12 months Vitals Screenings to include cognitive, depression, and falls Referrals and appointments  In addition, I have reviewed and discussed with patient certain preventive protocols, quality metrics, and best practice  recommendations. A written personalized care plan for preventive services as well as general preventive health recommendations were provided to patient.   Lavelle Pfeiffer [place], [place]   [id number]/7974   After Visit Summary: (MyChart) Due to this being a telephonic visit, the after visit summary with patients personalized plan was offered to patient via MyChart   Notes: Nothing significant to report at this time.

## 2024-06-25 NOTE — Patient Instructions (Signed)
 Mr. Tony Campos , Thank you for taking time out of your busy schedule to complete your Annual Wellness Visit with me. I enjoyed our conversation and look forward to speaking with you again next year. I, as well as your care team,  appreciate your ongoing commitment to your health goals. Please review the following plan we discussed and let me know if I can assist you in the future. Your Game plan/ To Do List     Follow up Visits: Next Medicare AWV with our clinical staff: In 1 year    Have you seen your provider in the last 6 months (3 months if uncontrolled diabetes)? Yes Next Office Visit with your provider: To be scheduled   Clinician Recommendations:  Aim for 30 minutes of exercise or brisk walking, 6-8 glasses of water, and 5 servings of fruits and vegetables each day.       This is a list of the screening recommended for you and due dates:  Health Maintenance  Topic Date Due   Hepatitis B Vaccine (1 of 3 - 19+ 3-dose series) Never done   COVID-19 Vaccine (5 - 2024-25 season) 07/29/2023   Colon Cancer Screening  09/27/2024*   Hepatitis C Screening  09/27/2024*   Flu Shot  06/27/2024   Medicare Annual Wellness Visit  06/25/2025   DTaP/Tdap/Td vaccine (4 - Td or Tdap) 12/24/2028   Pneumococcal Vaccination  Completed   HIV Screening  Completed   Zoster (Shingles) Vaccine  Completed   HPV Vaccine  Aged Out   Meningitis B Vaccine  Aged Out   Screening for Lung Cancer  Discontinued  *Topic was postponed. The date shown is not the original due date.    Advanced directives: (ACP Link)Information on Advanced Care Planning can be found at [place]  Secretary of [facility] Advance Health Care Directives Advance Health Care Directives. [email or web address]   Advance Care Planning is important because it:  [x]  Makes sure you receive the medical care that is consistent with your values, goals, and preferences  [x]  It provides guidance to your family and loved ones and reduces their decisional burden  about whether or not they are making the right decisions based on your wishes.  Follow the link provided in your after visit summary or read over the paperwork we have mailed to you to help you started getting your Advance Directives in place. If you need assistance in completing these, please reach out to us  so that we can help you!  See attachments for Preventive Care and Fall Prevention Tips.

## 2024-07-06 DIAGNOSIS — J449 Chronic obstructive pulmonary disease, unspecified: Secondary | ICD-10-CM | POA: Diagnosis not present

## 2024-07-16 ENCOUNTER — Other Ambulatory Visit: Payer: Self-pay | Admitting: Family Medicine

## 2024-07-17 ENCOUNTER — Other Ambulatory Visit: Payer: Self-pay | Admitting: Family Medicine

## 2024-07-17 NOTE — Telephone Encounter (Signed)
 Requested Prescriptions  Refused Prescriptions Disp Refills   valACYclovir  (VALTREX ) 500 MG tablet [Pharmacy Med Name: VALACYCLOVIR  HCL 500 MG TABLET] 90 tablet 0    Sig: take 1 tablet (500 milligram total) by mouth daily.     Antimicrobials:  Antiviral Agents - Anti-Herpetic Passed - 07/17/2024  4:56 PM      Passed - Valid encounter within last 12 months    Recent Outpatient Visits           3 months ago Left kidney mass   [place] [facility] Duanne, Butler DASEN, MD   4 months ago Inflammation of soft tissue   [place] [facility] Kayla Jeoffrey RAMAN, FNP   7 months ago Viral URI with cough   [place] [facility] Kayla Jeoffrey RAMAN, FNP   9 months ago ASCVD (arteriosclerotic cardiovascular disease)   [place] [facility] Pickard, Butler DASEN, MD   1 year ago Great toe pain, right   [place] [facility] Pickard, Butler DASEN, MD

## 2024-07-18 NOTE — Telephone Encounter (Signed)
 Requested Prescriptions  Pending Prescriptions Disp Refills   levothyroxine  (SYNTHROID ) 125 MCG tablet [Pharmacy Med Name: LEVOTHYROXINE  125 MCG TABLET] 90 tablet 0    Sig: take 1 tablet (125 microgram total) by mouth daily before breakfast.     Endocrinology:  Hypothyroid Agents Failed - 07/18/2024  3:34 PM      Failed - TSH in normal range and within 360 days    TSH  Date Value Ref Range Status  03/19/2024 0.310 (L) 0.350 - 4.500 uIU/mL Final    Comment:    Performed by a 3rd Generation assay with a functional sensitivity of <=0.01 uIU/mL. Performed at [facility], [address] [place], [place] [address]   06/18/2023 2.65 0.40 - 4.50 mIU/L Final         Passed - Valid encounter within last 12 months    Recent Outpatient Visits           3 months ago Left kidney mass   [place] [facility] Duanne, Butler DASEN, MD   4 months ago Inflammation of soft tissue   [place] [facility] Kayla Jeoffrey RAMAN, FNP   7 months ago Viral URI with cough   [place] [facility] Kayla Jeoffrey RAMAN, FNP   9 months ago ASCVD (arteriosclerotic cardiovascular disease)   [place] [facility] Pickard, Butler DASEN, MD   1 year ago Great toe pain, right   [place] [facility] Pickard, Butler DASEN, MD

## 2024-07-22 DIAGNOSIS — Z6833 Body mass index (BMI) 33.0-33.9, adult: Secondary | ICD-10-CM | POA: Diagnosis not present

## 2024-07-22 DIAGNOSIS — M4316 Spondylolisthesis, lumbar region: Secondary | ICD-10-CM | POA: Diagnosis not present

## 2024-07-22 DIAGNOSIS — M5126 Other intervertebral disc displacement, lumbar region: Secondary | ICD-10-CM | POA: Diagnosis not present

## 2024-07-29 DIAGNOSIS — M62830 Muscle spasm of back: Secondary | ICD-10-CM | POA: Diagnosis not present

## 2024-07-29 DIAGNOSIS — G894 Chronic pain syndrome: Secondary | ICD-10-CM | POA: Diagnosis not present

## 2024-07-29 DIAGNOSIS — M47812 Spondylosis without myelopathy or radiculopathy, cervical region: Secondary | ICD-10-CM | POA: Diagnosis not present

## 2024-07-29 DIAGNOSIS — M47816 Spondylosis without myelopathy or radiculopathy, lumbar region: Secondary | ICD-10-CM | POA: Diagnosis not present

## 2024-08-04 DIAGNOSIS — H5213 Myopia, bilateral: Secondary | ICD-10-CM | POA: Diagnosis not present

## 2024-08-06 DIAGNOSIS — J449 Chronic obstructive pulmonary disease, unspecified: Secondary | ICD-10-CM | POA: Diagnosis not present

## 2024-08-11 ENCOUNTER — Other Ambulatory Visit: Payer: Self-pay | Admitting: Family Medicine

## 2024-08-11 ENCOUNTER — Encounter: Payer: Self-pay | Admitting: Family Medicine

## 2024-08-11 ENCOUNTER — Other Ambulatory Visit: Payer: Self-pay

## 2024-08-11 MED ORDER — VALACYCLOVIR HCL 500 MG PO TABS: 500.0000 mg | ORAL_TABLET | Freq: Every day | ORAL | 0 refills | Status: DC

## 2024-08-18 DIAGNOSIS — H52223 Regular astigmatism, bilateral: Secondary | ICD-10-CM | POA: Diagnosis not present

## 2024-09-23 DIAGNOSIS — M62830 Muscle spasm of back: Secondary | ICD-10-CM | POA: Diagnosis not present

## 2024-09-23 DIAGNOSIS — G894 Chronic pain syndrome: Secondary | ICD-10-CM | POA: Diagnosis not present

## 2024-09-23 DIAGNOSIS — M47816 Spondylosis without myelopathy or radiculopathy, lumbar region: Secondary | ICD-10-CM | POA: Diagnosis not present

## 2024-09-23 DIAGNOSIS — M47812 Spondylosis without myelopathy or radiculopathy, cervical region: Secondary | ICD-10-CM | POA: Diagnosis not present

## 2024-09-29 ENCOUNTER — Encounter: Payer: Self-pay | Admitting: Radiology

## 2024-10-06 DIAGNOSIS — J449 Chronic obstructive pulmonary disease, unspecified: Secondary | ICD-10-CM | POA: Diagnosis not present

## 2024-10-21 ENCOUNTER — Other Ambulatory Visit: Payer: Self-pay | Admitting: Family Medicine

## 2024-10-21 DIAGNOSIS — M47812 Spondylosis without myelopathy or radiculopathy, cervical region: Secondary | ICD-10-CM | POA: Diagnosis not present

## 2024-10-21 DIAGNOSIS — M47816 Spondylosis without myelopathy or radiculopathy, lumbar region: Secondary | ICD-10-CM | POA: Diagnosis not present

## 2024-10-21 DIAGNOSIS — G894 Chronic pain syndrome: Secondary | ICD-10-CM | POA: Diagnosis not present

## 2024-10-21 DIAGNOSIS — M62830 Muscle spasm of back: Secondary | ICD-10-CM | POA: Diagnosis not present

## 2024-11-05 DIAGNOSIS — J449 Chronic obstructive pulmonary disease, unspecified: Secondary | ICD-10-CM | POA: Diagnosis not present

## 2024-11-12 DIAGNOSIS — M1711 Unilateral primary osteoarthritis, right knee: Secondary | ICD-10-CM | POA: Diagnosis not present

## 2024-11-17 ENCOUNTER — Other Ambulatory Visit: Payer: Self-pay | Admitting: Family Medicine

## 2024-11-18 ENCOUNTER — Other Ambulatory Visit: Payer: Self-pay | Admitting: Family Medicine

## 2024-11-21 ENCOUNTER — Other Ambulatory Visit: Payer: Self-pay | Admitting: Family Medicine

## 2024-12-18 ENCOUNTER — Other Ambulatory Visit: Payer: Self-pay | Admitting: Family Medicine

## 2025-07-01 ENCOUNTER — Ambulatory Visit
